# Patient Record
Sex: Female | Born: 1935 | Race: Black or African American | Hispanic: No | Smoking: Never smoker
Health system: Southern US, Community
[De-identification: ages and names within clinical notes are randomized; demographics above are authoritative.]

## PROBLEM LIST (undated history)

## (undated) DIAGNOSIS — M791 Myalgia, unspecified site: Secondary | ICD-10-CM

## (undated) DIAGNOSIS — R21 Rash and other nonspecific skin eruption: Secondary | ICD-10-CM

## (undated) DIAGNOSIS — E876 Hypokalemia: Secondary | ICD-10-CM

## (undated) DIAGNOSIS — G250 Essential tremor: Secondary | ICD-10-CM

## (undated) DIAGNOSIS — I10 Essential (primary) hypertension: Secondary | ICD-10-CM

## (undated) DIAGNOSIS — G894 Chronic pain syndrome: Secondary | ICD-10-CM

## (undated) DIAGNOSIS — K219 Gastro-esophageal reflux disease without esophagitis: Secondary | ICD-10-CM

## (undated) DIAGNOSIS — N39498 Other specified urinary incontinence: Secondary | ICD-10-CM

## (undated) DIAGNOSIS — R112 Nausea with vomiting, unspecified: Secondary | ICD-10-CM

## (undated) DIAGNOSIS — B354 Tinea corporis: Secondary | ICD-10-CM

## (undated) DIAGNOSIS — Z9889 Other specified postprocedural states: Secondary | ICD-10-CM

## (undated) DIAGNOSIS — R609 Edema, unspecified: Secondary | ICD-10-CM

## (undated) DIAGNOSIS — F419 Anxiety disorder, unspecified: Secondary | ICD-10-CM

## (undated) DIAGNOSIS — Z8719 Personal history of other diseases of the digestive system: Secondary | ICD-10-CM

## (undated) DIAGNOSIS — M1990 Unspecified osteoarthritis, unspecified site: Secondary | ICD-10-CM

## (undated) DIAGNOSIS — M419 Scoliosis, unspecified: Secondary | ICD-10-CM

## (undated) DIAGNOSIS — F32A Depression, unspecified: Secondary | ICD-10-CM

## (undated) DIAGNOSIS — R32 Unspecified urinary incontinence: Secondary | ICD-10-CM

## (undated) DIAGNOSIS — K8020 Calculus of gallbladder without cholecystitis without obstruction: Secondary | ICD-10-CM

## (undated) DIAGNOSIS — I77819 Aortic ectasia, unspecified site: Secondary | ICD-10-CM

## (undated) DIAGNOSIS — J189 Pneumonia, unspecified organism: Secondary | ICD-10-CM

## (undated) DIAGNOSIS — R29898 Other symptoms and signs involving the musculoskeletal system: Secondary | ICD-10-CM

## (undated) DIAGNOSIS — T148XXA Other injury of unspecified body region, initial encounter: Secondary | ICD-10-CM

## (undated) DIAGNOSIS — R011 Cardiac murmur, unspecified: Secondary | ICD-10-CM

## (undated) DIAGNOSIS — T7840XA Allergy, unspecified, initial encounter: Secondary | ICD-10-CM

## (undated) DIAGNOSIS — M546 Pain in thoracic spine: Secondary | ICD-10-CM

## (undated) DIAGNOSIS — Z9289 Personal history of other medical treatment: Secondary | ICD-10-CM

## (undated) DIAGNOSIS — F329 Major depressive disorder, single episode, unspecified: Secondary | ICD-10-CM

## (undated) DIAGNOSIS — S83209A Unspecified tear of unspecified meniscus, current injury, unspecified knee, initial encounter: Secondary | ICD-10-CM

## (undated) DIAGNOSIS — M79669 Pain in unspecified lower leg: Secondary | ICD-10-CM

## (undated) DIAGNOSIS — E7800 Pure hypercholesterolemia, unspecified: Secondary | ICD-10-CM

## (undated) HISTORY — DX: Chronic pain syndrome: G89.4

## (undated) HISTORY — DX: Other symptoms and signs involving the musculoskeletal system: R29.898

## (undated) HISTORY — DX: Hypokalemia: E87.6

## (undated) HISTORY — DX: Aortic ectasia, unspecified site: I77.819

## (undated) HISTORY — PX: TONSILLECTOMY: SUR1361

## (undated) HISTORY — PX: BREAST BIOPSY: SHX20

## (undated) HISTORY — DX: Essential (primary) hypertension: I10

## (undated) HISTORY — DX: Other injury of unspecified body region, initial encounter: T14.8XXA

## (undated) HISTORY — PX: KNEE ARTHROSCOPY: SUR90

## (undated) HISTORY — PX: POSTERIOR LUMBAR FUSION: SHX6036

## (undated) HISTORY — PX: SHOULDER ARTHROSCOPY W/ ROTATOR CUFF REPAIR: SHX2400

## (undated) HISTORY — DX: Other specified urinary incontinence: N39.498

## (undated) HISTORY — DX: Edema, unspecified: R60.9

## (undated) HISTORY — PX: VAGINAL HYSTERECTOMY: SUR661

## (undated) HISTORY — DX: Major depressive disorder, single episode, unspecified: F32.9

## (undated) HISTORY — DX: Allergy, unspecified, initial encounter: T78.40XA

## (undated) HISTORY — PX: JOINT REPLACEMENT: SHX530

## (undated) HISTORY — PX: DILATION AND CURETTAGE OF UTERUS: SHX78

## (undated) HISTORY — DX: Depression, unspecified: F32.A

## (undated) HISTORY — PX: BREAST LUMPECTOMY: SHX2

## (undated) HISTORY — DX: Pain in thoracic spine: M54.6

## (undated) HISTORY — PX: ABDOMINAL HYSTERECTOMY: SHX81

## (undated) HISTORY — DX: Rash and other nonspecific skin eruption: R21

## (undated) HISTORY — DX: Tinea corporis: B35.4

## (undated) HISTORY — DX: Unspecified tear of unspecified meniscus, current injury, unspecified knee, initial encounter: S83.209A

## (undated) HISTORY — DX: Essential tremor: G25.0

## (undated) HISTORY — DX: Scoliosis, unspecified: M41.9

## (undated) HISTORY — PX: BACK SURGERY: SHX140

## (undated) HISTORY — DX: Pain in unspecified lower leg: M79.669

## (undated) HISTORY — PX: ROTATOR CUFF REPAIR: SHX139

## (undated) HISTORY — DX: Myalgia, unspecified site: M79.10

## (undated) HISTORY — DX: Calculus of gallbladder without cholecystitis without obstruction: K80.20

## (undated) HISTORY — DX: Unspecified osteoarthritis, unspecified site: M19.90

## (undated) HISTORY — PX: TOTAL KNEE ARTHROPLASTY: SHX125

## (undated) HISTORY — PX: KNEE ARTHROSCOPY: SHX127

---

## 1997-12-07 ENCOUNTER — Other Ambulatory Visit: Admission: RE | Admit: 1997-12-07 | Discharge: 1997-12-07 | Payer: Self-pay | Admitting: Urology

## 1998-05-16 ENCOUNTER — Ambulatory Visit (HOSPITAL_BASED_OUTPATIENT_CLINIC_OR_DEPARTMENT_OTHER): Admission: RE | Admit: 1998-05-16 | Discharge: 1998-05-16 | Payer: Self-pay | Admitting: Orthopedic Surgery

## 1999-03-21 ENCOUNTER — Encounter: Payer: Self-pay | Admitting: Family Medicine

## 1999-03-21 ENCOUNTER — Ambulatory Visit (HOSPITAL_COMMUNITY): Admission: RE | Admit: 1999-03-21 | Discharge: 1999-03-21 | Payer: Self-pay | Admitting: Family Medicine

## 1999-04-11 ENCOUNTER — Encounter: Payer: Self-pay | Admitting: Obstetrics and Gynecology

## 1999-04-11 ENCOUNTER — Encounter: Admission: RE | Admit: 1999-04-11 | Discharge: 1999-04-11 | Payer: Self-pay | Admitting: Obstetrics and Gynecology

## 1999-04-18 ENCOUNTER — Encounter: Admission: RE | Admit: 1999-04-18 | Discharge: 1999-04-18 | Payer: Self-pay | Admitting: Obstetrics and Gynecology

## 1999-04-18 ENCOUNTER — Encounter: Payer: Self-pay | Admitting: Obstetrics and Gynecology

## 1999-05-16 ENCOUNTER — Encounter: Admission: RE | Admit: 1999-05-16 | Discharge: 1999-05-16 | Payer: Self-pay | Admitting: Obstetrics and Gynecology

## 1999-05-16 ENCOUNTER — Encounter: Payer: Self-pay | Admitting: Obstetrics and Gynecology

## 1999-05-23 ENCOUNTER — Encounter: Admission: RE | Admit: 1999-05-23 | Discharge: 1999-08-15 | Payer: Self-pay | Admitting: Family Medicine

## 1999-05-23 ENCOUNTER — Encounter (INDEPENDENT_AMBULATORY_CARE_PROVIDER_SITE_OTHER): Payer: Self-pay | Admitting: *Deleted

## 1999-05-31 ENCOUNTER — Other Ambulatory Visit: Admission: RE | Admit: 1999-05-31 | Discharge: 1999-05-31 | Payer: Self-pay | Admitting: Urology

## 1999-06-25 ENCOUNTER — Encounter: Admission: RE | Admit: 1999-06-25 | Discharge: 1999-08-15 | Payer: Self-pay | Admitting: Family Medicine

## 1999-09-18 ENCOUNTER — Encounter: Admission: RE | Admit: 1999-09-18 | Discharge: 1999-12-17 | Payer: Self-pay | Admitting: Family Medicine

## 2000-06-18 ENCOUNTER — Encounter: Payer: Self-pay | Admitting: Obstetrics and Gynecology

## 2000-06-18 ENCOUNTER — Encounter: Admission: RE | Admit: 2000-06-18 | Discharge: 2000-06-18 | Payer: Self-pay | Admitting: Obstetrics and Gynecology

## 2000-07-25 ENCOUNTER — Ambulatory Visit (HOSPITAL_COMMUNITY): Admission: RE | Admit: 2000-07-25 | Discharge: 2000-07-25 | Payer: Self-pay

## 2000-07-25 ENCOUNTER — Encounter: Payer: Self-pay | Admitting: Family Medicine

## 2001-06-17 ENCOUNTER — Encounter: Payer: Self-pay | Admitting: Family Medicine

## 2001-06-17 ENCOUNTER — Encounter: Admission: RE | Admit: 2001-06-17 | Discharge: 2001-06-17 | Payer: Self-pay | Admitting: Family Medicine

## 2002-03-12 ENCOUNTER — Encounter: Admission: RE | Admit: 2002-03-12 | Discharge: 2002-03-12 | Payer: Self-pay | Admitting: Family Medicine

## 2002-03-12 ENCOUNTER — Encounter: Payer: Self-pay | Admitting: Family Medicine

## 2002-03-21 ENCOUNTER — Encounter: Admission: RE | Admit: 2002-03-21 | Discharge: 2002-03-21 | Payer: Self-pay | Admitting: Family Medicine

## 2002-03-21 ENCOUNTER — Encounter: Payer: Self-pay | Admitting: Family Medicine

## 2002-07-14 ENCOUNTER — Encounter: Payer: Self-pay | Admitting: Family Medicine

## 2002-07-14 ENCOUNTER — Encounter: Admission: RE | Admit: 2002-07-14 | Discharge: 2002-07-14 | Payer: Self-pay | Admitting: Family Medicine

## 2002-11-25 ENCOUNTER — Emergency Department (HOSPITAL_COMMUNITY): Admission: EM | Admit: 2002-11-25 | Discharge: 2002-11-25 | Payer: Self-pay | Admitting: Emergency Medicine

## 2002-12-03 ENCOUNTER — Encounter: Admission: RE | Admit: 2002-12-03 | Discharge: 2002-12-03 | Payer: Self-pay | Admitting: Family Medicine

## 2002-12-03 ENCOUNTER — Encounter: Payer: Self-pay | Admitting: Family Medicine

## 2003-04-12 ENCOUNTER — Encounter: Admission: RE | Admit: 2003-04-12 | Discharge: 2003-04-12 | Payer: Self-pay | Admitting: Neurology

## 2003-07-25 ENCOUNTER — Encounter: Admission: RE | Admit: 2003-07-25 | Discharge: 2003-07-25 | Payer: Self-pay | Admitting: Orthopedic Surgery

## 2003-10-10 ENCOUNTER — Encounter
Admission: RE | Admit: 2003-10-10 | Discharge: 2003-10-10 | Payer: Self-pay | Admitting: Physical Medicine and Rehabilitation

## 2004-01-13 ENCOUNTER — Ambulatory Visit (HOSPITAL_COMMUNITY): Admission: RE | Admit: 2004-01-13 | Discharge: 2004-01-13 | Payer: Self-pay | Admitting: Family Medicine

## 2004-04-03 ENCOUNTER — Ambulatory Visit: Payer: Self-pay | Admitting: Internal Medicine

## 2004-04-03 ENCOUNTER — Ambulatory Visit: Payer: Self-pay | Admitting: Family Medicine

## 2004-04-06 ENCOUNTER — Ambulatory Visit: Payer: Self-pay | Admitting: Cardiology

## 2004-04-09 ENCOUNTER — Ambulatory Visit: Payer: Self-pay

## 2004-04-19 ENCOUNTER — Ambulatory Visit: Payer: Self-pay | Admitting: Internal Medicine

## 2004-04-20 ENCOUNTER — Ambulatory Visit: Payer: Self-pay | Admitting: *Deleted

## 2004-04-23 ENCOUNTER — Ambulatory Visit: Payer: Self-pay | Admitting: *Deleted

## 2004-05-07 ENCOUNTER — Ambulatory Visit: Payer: Self-pay | Admitting: *Deleted

## 2004-05-21 ENCOUNTER — Ambulatory Visit: Payer: Self-pay | Admitting: Internal Medicine

## 2004-05-22 ENCOUNTER — Ambulatory Visit: Payer: Self-pay | Admitting: Family Medicine

## 2004-06-01 ENCOUNTER — Ambulatory Visit: Payer: Self-pay | Admitting: Family Medicine

## 2004-06-12 ENCOUNTER — Ambulatory Visit: Payer: Self-pay | Admitting: Family Medicine

## 2004-06-13 ENCOUNTER — Encounter: Admission: RE | Admit: 2004-06-13 | Discharge: 2004-06-13 | Payer: Self-pay | Admitting: Family Medicine

## 2004-06-19 ENCOUNTER — Ambulatory Visit: Payer: Self-pay | Admitting: Internal Medicine

## 2004-06-21 ENCOUNTER — Ambulatory Visit: Payer: Self-pay | Admitting: Internal Medicine

## 2004-06-26 ENCOUNTER — Ambulatory Visit: Payer: Self-pay | Admitting: Family Medicine

## 2004-07-02 ENCOUNTER — Encounter: Admission: RE | Admit: 2004-07-02 | Discharge: 2004-07-02 | Payer: Self-pay | Admitting: Family Medicine

## 2004-07-04 ENCOUNTER — Ambulatory Visit: Payer: Self-pay | Admitting: Family Medicine

## 2004-07-05 ENCOUNTER — Ambulatory Visit: Payer: Self-pay

## 2004-07-16 ENCOUNTER — Encounter: Admission: RE | Admit: 2004-07-16 | Discharge: 2004-07-16 | Payer: Self-pay | Admitting: Family Medicine

## 2004-07-26 ENCOUNTER — Ambulatory Visit: Payer: Self-pay | Admitting: Family Medicine

## 2004-07-30 ENCOUNTER — Ambulatory Visit: Payer: Self-pay | Admitting: Family Medicine

## 2004-08-10 ENCOUNTER — Ambulatory Visit: Payer: Self-pay | Admitting: Internal Medicine

## 2004-08-15 ENCOUNTER — Ambulatory Visit: Payer: Self-pay | Admitting: Family Medicine

## 2004-09-19 ENCOUNTER — Ambulatory Visit: Payer: Self-pay | Admitting: Internal Medicine

## 2004-11-12 ENCOUNTER — Ambulatory Visit: Payer: Self-pay | Admitting: Internal Medicine

## 2004-12-04 ENCOUNTER — Ambulatory Visit: Payer: Self-pay | Admitting: Family Medicine

## 2004-12-21 ENCOUNTER — Ambulatory Visit: Payer: Self-pay | Admitting: Internal Medicine

## 2004-12-21 ENCOUNTER — Ambulatory Visit: Payer: Self-pay | Admitting: Family Medicine

## 2005-01-08 ENCOUNTER — Ambulatory Visit: Payer: Self-pay | Admitting: Internal Medicine

## 2005-01-11 ENCOUNTER — Ambulatory Visit: Payer: Self-pay | Admitting: Family Medicine

## 2005-02-06 ENCOUNTER — Encounter (INDEPENDENT_AMBULATORY_CARE_PROVIDER_SITE_OTHER): Payer: Self-pay | Admitting: *Deleted

## 2005-02-06 ENCOUNTER — Ambulatory Visit: Payer: Self-pay | Admitting: Physical Medicine & Rehabilitation

## 2005-02-06 ENCOUNTER — Inpatient Hospital Stay (HOSPITAL_COMMUNITY): Admission: RE | Admit: 2005-02-06 | Discharge: 2005-02-12 | Payer: Self-pay | Admitting: Specialist

## 2005-02-12 ENCOUNTER — Inpatient Hospital Stay
Admission: RE | Admit: 2005-02-12 | Discharge: 2005-02-22 | Payer: Self-pay | Admitting: Physical Medicine & Rehabilitation

## 2005-03-18 HISTORY — PX: LUMBAR LAMINECTOMY/DECOMPRESSION MICRODISCECTOMY: SHX5026

## 2005-03-19 ENCOUNTER — Ambulatory Visit: Payer: Self-pay | Admitting: Family Medicine

## 2005-04-04 ENCOUNTER — Ambulatory Visit: Payer: Self-pay | Admitting: Family Medicine

## 2005-06-18 ENCOUNTER — Ambulatory Visit: Payer: Self-pay | Admitting: Internal Medicine

## 2005-11-07 ENCOUNTER — Ambulatory Visit: Payer: Self-pay | Admitting: Family Medicine

## 2005-12-19 ENCOUNTER — Encounter: Admission: RE | Admit: 2005-12-19 | Discharge: 2005-12-19 | Payer: Self-pay | Admitting: Specialist

## 2006-01-10 ENCOUNTER — Ambulatory Visit: Payer: Self-pay | Admitting: Family Medicine

## 2006-01-21 ENCOUNTER — Encounter: Admission: RE | Admit: 2006-01-21 | Discharge: 2006-01-21 | Payer: Self-pay | Admitting: Specialist

## 2006-02-25 ENCOUNTER — Encounter: Admission: RE | Admit: 2006-02-25 | Discharge: 2006-02-25 | Payer: Self-pay | Admitting: Specialist

## 2006-03-04 ENCOUNTER — Ambulatory Visit: Payer: Self-pay | Admitting: Family Medicine

## 2006-05-01 ENCOUNTER — Ambulatory Visit: Payer: Self-pay | Admitting: Internal Medicine

## 2006-05-01 ENCOUNTER — Ambulatory Visit: Payer: Self-pay | Admitting: Family Medicine

## 2006-05-15 ENCOUNTER — Ambulatory Visit: Payer: Self-pay | Admitting: Family Medicine

## 2006-06-17 ENCOUNTER — Emergency Department (HOSPITAL_COMMUNITY): Admission: EM | Admit: 2006-06-17 | Discharge: 2006-06-17 | Payer: Self-pay | Admitting: Family Medicine

## 2006-06-19 ENCOUNTER — Ambulatory Visit: Payer: Self-pay | Admitting: Family Medicine

## 2006-06-25 ENCOUNTER — Ambulatory Visit: Payer: Self-pay | Admitting: Internal Medicine

## 2006-06-25 LAB — CONVERTED CEMR LAB
ALT: 15 units/L (ref 0–40)
AST: 14 units/L (ref 0–37)
Alkaline Phosphatase: 60 units/L (ref 39–117)
BUN: 23 mg/dL (ref 6–23)
Bilirubin, Direct: 0.1 mg/dL (ref 0.0–0.3)
CO2: 27 meq/L (ref 19–32)
Calcium: 9.7 mg/dL (ref 8.4–10.5)
Creatinine, Ser: 1.2 mg/dL (ref 0.4–1.2)
Direct LDL: 140.9 mg/dL
GFR calc Af Amer: 57 mL/min
Glucose, Bld: 110 mg/dL — ABNORMAL HIGH (ref 70–99)
Triglycerides: 127 mg/dL (ref 0–149)
VLDL: 25 mg/dL (ref 0–40)

## 2006-08-07 ENCOUNTER — Emergency Department (HOSPITAL_COMMUNITY): Admission: EM | Admit: 2006-08-07 | Discharge: 2006-08-07 | Payer: Self-pay | Admitting: Family Medicine

## 2006-08-12 ENCOUNTER — Ambulatory Visit: Payer: Self-pay | Admitting: Family Medicine

## 2006-08-20 DIAGNOSIS — I10 Essential (primary) hypertension: Secondary | ICD-10-CM | POA: Insufficient documentation

## 2006-08-20 DIAGNOSIS — M1990 Unspecified osteoarthritis, unspecified site: Secondary | ICD-10-CM

## 2006-08-25 ENCOUNTER — Ambulatory Visit: Payer: Self-pay | Admitting: Internal Medicine

## 2006-08-26 ENCOUNTER — Encounter (INDEPENDENT_AMBULATORY_CARE_PROVIDER_SITE_OTHER): Payer: Self-pay | Admitting: *Deleted

## 2006-09-22 ENCOUNTER — Ambulatory Visit: Payer: Self-pay | Admitting: Family Medicine

## 2006-09-22 DIAGNOSIS — Z885 Allergy status to narcotic agent status: Secondary | ICD-10-CM | POA: Insufficient documentation

## 2006-09-22 DIAGNOSIS — B354 Tinea corporis: Secondary | ICD-10-CM | POA: Insufficient documentation

## 2006-10-09 ENCOUNTER — Telehealth (INDEPENDENT_AMBULATORY_CARE_PROVIDER_SITE_OTHER): Payer: Self-pay | Admitting: *Deleted

## 2006-12-03 ENCOUNTER — Telehealth (INDEPENDENT_AMBULATORY_CARE_PROVIDER_SITE_OTHER): Payer: Self-pay | Admitting: *Deleted

## 2006-12-05 ENCOUNTER — Emergency Department (HOSPITAL_COMMUNITY): Admission: EM | Admit: 2006-12-05 | Discharge: 2006-12-06 | Payer: Self-pay | Admitting: Emergency Medicine

## 2006-12-17 ENCOUNTER — Ambulatory Visit: Payer: Self-pay | Admitting: Internal Medicine

## 2006-12-22 ENCOUNTER — Emergency Department (HOSPITAL_COMMUNITY): Admission: EM | Admit: 2006-12-22 | Discharge: 2006-12-22 | Payer: Self-pay | Admitting: Emergency Medicine

## 2006-12-25 ENCOUNTER — Inpatient Hospital Stay (HOSPITAL_COMMUNITY): Admission: AD | Admit: 2006-12-25 | Discharge: 2006-12-28 | Payer: Self-pay | Admitting: *Deleted

## 2006-12-25 ENCOUNTER — Ambulatory Visit: Payer: Self-pay | Admitting: *Deleted

## 2006-12-25 ENCOUNTER — Telehealth: Payer: Self-pay | Admitting: Family Medicine

## 2007-01-01 ENCOUNTER — Ambulatory Visit: Payer: Self-pay | Admitting: Family Medicine

## 2007-01-01 ENCOUNTER — Telehealth: Payer: Self-pay | Admitting: Family Medicine

## 2007-01-01 DIAGNOSIS — Z862 Personal history of diseases of the blood and blood-forming organs and certain disorders involving the immune mechanism: Secondary | ICD-10-CM | POA: Insufficient documentation

## 2007-01-01 DIAGNOSIS — F329 Major depressive disorder, single episode, unspecified: Secondary | ICD-10-CM

## 2007-01-01 DIAGNOSIS — Z8639 Personal history of other endocrine, nutritional and metabolic disease: Secondary | ICD-10-CM

## 2007-01-05 LAB — CONVERTED CEMR LAB
ALT: 19 units/L (ref 0–35)
AST: 19 units/L (ref 0–37)
Alkaline Phosphatase: 52 units/L (ref 39–117)
BUN: 13 mg/dL (ref 6–23)
Cholesterol: 229 mg/dL (ref 0–200)
Direct LDL: 162.7 mg/dL
HDL: 49.3 mg/dL (ref >39.0)
Potassium: 3.5 meq/L (ref 3.5–5.1)
Sodium: 140 meq/L (ref 135–145)
Total CHOL/HDL Ratio: 4.6
Total Protein: 6.7 g/dL (ref 6.0–8.3)

## 2007-01-19 ENCOUNTER — Telehealth: Payer: Self-pay | Admitting: Family Medicine

## 2007-01-23 ENCOUNTER — Encounter (INDEPENDENT_AMBULATORY_CARE_PROVIDER_SITE_OTHER): Payer: Self-pay | Admitting: *Deleted

## 2007-02-05 ENCOUNTER — Telehealth: Payer: Self-pay | Admitting: Family Medicine

## 2007-02-06 ENCOUNTER — Telehealth (INDEPENDENT_AMBULATORY_CARE_PROVIDER_SITE_OTHER): Payer: Self-pay | Admitting: *Deleted

## 2007-02-10 ENCOUNTER — Ambulatory Visit: Payer: Self-pay | Admitting: Family Medicine

## 2007-02-10 DIAGNOSIS — M79609 Pain in unspecified limb: Secondary | ICD-10-CM

## 2007-02-10 DIAGNOSIS — IMO0001 Reserved for inherently not codable concepts without codable children: Secondary | ICD-10-CM

## 2007-02-11 ENCOUNTER — Encounter: Payer: Self-pay | Admitting: Family Medicine

## 2007-02-11 ENCOUNTER — Ambulatory Visit: Payer: Self-pay | Admitting: Cardiology

## 2007-02-17 DIAGNOSIS — R29898 Other symptoms and signs involving the musculoskeletal system: Secondary | ICD-10-CM

## 2007-02-23 ENCOUNTER — Encounter: Admission: RE | Admit: 2007-02-23 | Discharge: 2007-02-23 | Payer: Self-pay | Admitting: Family Medicine

## 2007-02-25 DIAGNOSIS — IMO0002 Reserved for concepts with insufficient information to code with codable children: Secondary | ICD-10-CM

## 2007-04-01 ENCOUNTER — Ambulatory Visit: Payer: Self-pay | Admitting: Family Medicine

## 2007-04-08 LAB — CONVERTED CEMR LAB
Cholesterol: 207 mg/dL (ref 0–200)
HDL: 47.7 mg/dL (ref >39.0)
Total Bilirubin: 0.8 mg/dL (ref 0.3–1.2)
Total Protein: 7 g/dL (ref 6.0–8.3)
Triglycerides: 103 mg/dL (ref 0–149)
VLDL: 21 mg/dL (ref 0–40)

## 2007-04-13 ENCOUNTER — Telehealth (INDEPENDENT_AMBULATORY_CARE_PROVIDER_SITE_OTHER): Payer: Self-pay | Admitting: *Deleted

## 2007-04-17 ENCOUNTER — Ambulatory Visit: Payer: Self-pay | Admitting: Internal Medicine

## 2007-05-23 ENCOUNTER — Emergency Department (HOSPITAL_COMMUNITY): Admission: EM | Admit: 2007-05-23 | Discharge: 2007-05-23 | Payer: Self-pay | Admitting: Family Medicine

## 2007-06-09 ENCOUNTER — Ambulatory Visit: Payer: Self-pay | Admitting: Family Medicine

## 2007-06-11 ENCOUNTER — Ambulatory Visit: Payer: Self-pay | Admitting: Family Medicine

## 2007-06-11 DIAGNOSIS — M546 Pain in thoracic spine: Secondary | ICD-10-CM

## 2007-06-11 LAB — CONVERTED CEMR LAB
Blood in Urine, dipstick: NEGATIVE
Glucose, Urine, Semiquant: NEGATIVE
Nitrite: NEGATIVE
Protein, U semiquant: NEGATIVE

## 2007-06-12 ENCOUNTER — Encounter (INDEPENDENT_AMBULATORY_CARE_PROVIDER_SITE_OTHER): Payer: Self-pay | Admitting: *Deleted

## 2007-06-12 ENCOUNTER — Ambulatory Visit: Payer: Self-pay | Admitting: Family Medicine

## 2007-06-15 ENCOUNTER — Telehealth: Payer: Self-pay | Admitting: Family Medicine

## 2007-06-15 DIAGNOSIS — M4120 Other idiopathic scoliosis, site unspecified: Secondary | ICD-10-CM | POA: Insufficient documentation

## 2007-07-06 ENCOUNTER — Encounter (INDEPENDENT_AMBULATORY_CARE_PROVIDER_SITE_OTHER): Payer: Self-pay | Admitting: *Deleted

## 2007-07-28 ENCOUNTER — Encounter: Payer: Self-pay | Admitting: Family Medicine

## 2007-09-08 ENCOUNTER — Telehealth (INDEPENDENT_AMBULATORY_CARE_PROVIDER_SITE_OTHER): Payer: Self-pay | Admitting: *Deleted

## 2007-09-14 ENCOUNTER — Ambulatory Visit: Payer: Self-pay | Admitting: Family Medicine

## 2007-09-14 DIAGNOSIS — T148XXA Other injury of unspecified body region, initial encounter: Secondary | ICD-10-CM | POA: Insufficient documentation

## 2007-09-22 ENCOUNTER — Telehealth (INDEPENDENT_AMBULATORY_CARE_PROVIDER_SITE_OTHER): Payer: Self-pay | Admitting: *Deleted

## 2007-09-24 ENCOUNTER — Encounter (INDEPENDENT_AMBULATORY_CARE_PROVIDER_SITE_OTHER): Payer: Self-pay | Admitting: *Deleted

## 2007-09-24 LAB — CONVERTED CEMR LAB
ALT: 13 units/L (ref 0–35)
AST: 15 units/L (ref 0–37)
Alkaline Phosphatase: 67 units/L (ref 39–117)
Basophils Relative: 0.6 % (ref 0.0–1.0)
Bilirubin, Direct: 0.1 mg/dL (ref 0.0–0.3)
Calcium: 9.8 mg/dL (ref 8.4–10.5)
GFR calc Af Amer: 52 mL/min
HCT: 32.5 % — ABNORMAL LOW (ref 36.0–46.0)
Hemoglobin: 11 g/dL — ABNORMAL LOW (ref 12.0–15.0)
INR: 1.1 — ABNORMAL HIGH (ref 0.8–1.0)
MCHC: 33.9 g/dL (ref 30.0–36.0)
Monocytes Absolute: 0.6 10*3/uL (ref 0.1–1.0)
Neutrophils Relative %: 47.4 % (ref 43.0–77.0)
Prothrombin Time: 13.3 s (ref 10.9–13.3)
RDW: 12.8 % (ref 11.5–14.6)
Sodium: 141 meq/L (ref 135–145)
Total Bilirubin: 0.8 mg/dL (ref 0.3–1.2)
WBC: 5.5 10*3/uL (ref 4.5–10.5)

## 2007-09-29 ENCOUNTER — Telehealth (INDEPENDENT_AMBULATORY_CARE_PROVIDER_SITE_OTHER): Payer: Self-pay | Admitting: *Deleted

## 2007-10-22 ENCOUNTER — Ambulatory Visit: Payer: Self-pay | Admitting: Family Medicine

## 2007-11-01 LAB — CONVERTED CEMR LAB
Albumin: 4 g/dL (ref 3.5–5.2)
Alkaline Phosphatase: 69 units/L (ref 39–117)
Basophils Absolute: 0.1 10*3/uL (ref 0.0–0.1)
Eosinophils Absolute: 0.4 10*3/uL (ref 0.0–0.7)
Eosinophils Relative: 5.6 % — ABNORMAL HIGH (ref 0.0–5.0)
HCT: 32.6 % — ABNORMAL LOW (ref 36.0–46.0)
Lymphocytes Relative: 43.2 % (ref 12.0–46.0)
Monocytes Absolute: 0.7 10*3/uL (ref 0.1–1.0)
Neutro Abs: 2.5 10*3/uL (ref 1.4–7.7)
Neutrophils Relative %: 39.7 % — ABNORMAL LOW (ref 43.0–77.0)
RBC: 3.64 M/uL — ABNORMAL LOW (ref 3.87–5.11)
RDW: 12.7 % (ref 11.5–14.6)
Total Bilirubin: 0.7 mg/dL (ref 0.3–1.2)
Total Protein: 6.8 g/dL (ref 6.0–8.3)
WBC: 6.3 10*3/uL (ref 4.5–10.5)

## 2007-11-02 ENCOUNTER — Encounter (INDEPENDENT_AMBULATORY_CARE_PROVIDER_SITE_OTHER): Payer: Self-pay | Admitting: *Deleted

## 2007-11-12 ENCOUNTER — Telehealth (INDEPENDENT_AMBULATORY_CARE_PROVIDER_SITE_OTHER): Payer: Self-pay | Admitting: *Deleted

## 2007-11-29 ENCOUNTER — Encounter: Payer: Self-pay | Admitting: Internal Medicine

## 2007-11-29 ENCOUNTER — Inpatient Hospital Stay (HOSPITAL_COMMUNITY): Admission: EM | Admit: 2007-11-29 | Discharge: 2007-12-01 | Payer: Self-pay | Admitting: Emergency Medicine

## 2007-11-29 ENCOUNTER — Ambulatory Visit: Payer: Self-pay | Admitting: Internal Medicine

## 2007-12-01 ENCOUNTER — Encounter: Payer: Self-pay | Admitting: Family Medicine

## 2007-12-07 ENCOUNTER — Ambulatory Visit: Payer: Self-pay | Admitting: Family Medicine

## 2007-12-07 LAB — CONVERTED CEMR LAB
Iron: 68 ug/dL (ref 42–145)
Transferrin: 202.2 mg/dL — ABNORMAL LOW (ref >=212.0)

## 2007-12-08 ENCOUNTER — Encounter: Payer: Self-pay | Admitting: Family Medicine

## 2007-12-09 ENCOUNTER — Ambulatory Visit: Payer: Self-pay | Admitting: Family Medicine

## 2007-12-10 ENCOUNTER — Emergency Department (HOSPITAL_COMMUNITY): Admission: EM | Admit: 2007-12-10 | Discharge: 2007-12-10 | Payer: Self-pay | Admitting: Emergency Medicine

## 2007-12-10 ENCOUNTER — Telehealth: Payer: Self-pay | Admitting: Family Medicine

## 2007-12-14 ENCOUNTER — Telehealth (INDEPENDENT_AMBULATORY_CARE_PROVIDER_SITE_OTHER): Payer: Self-pay | Admitting: *Deleted

## 2007-12-14 LAB — CONVERTED CEMR LAB
Eosinophils Absolute: 0.3 10*3/uL (ref 0.0–0.7)
HCT: 33.2 % — ABNORMAL LOW (ref 36.0–46.0)
Hemoglobin: 10.9 g/dL — ABNORMAL LOW (ref 12.0–15.0)
Lymphocytes Relative: 48.7 % — ABNORMAL HIGH (ref 12.0–46.0)
MCV: 91.1 fL (ref 78.0–100.0)
Neutro Abs: 1.3 10*3/uL — ABNORMAL LOW (ref 1.4–7.7)
Neutrophils Relative %: 31.4 % — ABNORMAL LOW (ref 43.0–77.0)
WBC: 4 10*3/uL — ABNORMAL LOW (ref 4.5–10.5)

## 2007-12-15 ENCOUNTER — Encounter: Admission: RE | Admit: 2007-12-15 | Discharge: 2007-12-15 | Payer: Self-pay | Admitting: Neurosurgery

## 2007-12-22 ENCOUNTER — Ambulatory Visit: Payer: Self-pay | Admitting: Family Medicine

## 2007-12-22 ENCOUNTER — Encounter (INDEPENDENT_AMBULATORY_CARE_PROVIDER_SITE_OTHER): Payer: Self-pay | Admitting: *Deleted

## 2007-12-22 LAB — CONVERTED CEMR LAB
OCCULT 2: NEGATIVE
OCCULT 3: NEGATIVE

## 2007-12-28 ENCOUNTER — Ambulatory Visit: Payer: Self-pay | Admitting: Family Medicine

## 2007-12-30 ENCOUNTER — Ambulatory Visit: Payer: Self-pay | Admitting: Psychology

## 2007-12-30 ENCOUNTER — Ambulatory Visit: Payer: Self-pay | Admitting: Internal Medicine

## 2007-12-31 ENCOUNTER — Ambulatory Visit: Payer: Self-pay | Admitting: Family Medicine

## 2007-12-31 DIAGNOSIS — G894 Chronic pain syndrome: Secondary | ICD-10-CM

## 2007-12-31 LAB — CONVERTED CEMR LAB
Basophils Absolute: 0 10*3/uL (ref 0.0–0.1)
Basophils Relative: 0.7 % (ref 0.0–3.0)
Eosinophils Absolute: 0.3 10*3/uL (ref 0.0–0.7)
Platelets: 328 10*3/uL (ref 150–400)

## 2008-01-01 ENCOUNTER — Encounter (INDEPENDENT_AMBULATORY_CARE_PROVIDER_SITE_OTHER): Payer: Self-pay | Admitting: *Deleted

## 2008-01-11 ENCOUNTER — Ambulatory Visit: Payer: Self-pay | Admitting: Psychology

## 2008-01-14 ENCOUNTER — Ambulatory Visit: Payer: Self-pay | Admitting: Family Medicine

## 2008-01-15 ENCOUNTER — Encounter: Payer: Self-pay | Admitting: Family Medicine

## 2008-01-21 ENCOUNTER — Encounter: Payer: Self-pay | Admitting: Family Medicine

## 2008-01-28 ENCOUNTER — Ambulatory Visit: Payer: Self-pay | Admitting: Psychology

## 2008-02-15 ENCOUNTER — Ambulatory Visit: Payer: Self-pay | Admitting: Family Medicine

## 2008-02-15 DIAGNOSIS — N39498 Other specified urinary incontinence: Secondary | ICD-10-CM | POA: Insufficient documentation

## 2008-02-15 LAB — CONVERTED CEMR LAB
Bilirubin Urine: NEGATIVE
Blood in Urine, dipstick: NEGATIVE
Glucose, Urine, Semiquant: NEGATIVE
Ketones, urine, test strip: NEGATIVE
Nitrite: NEGATIVE
Protein, U semiquant: NEGATIVE
Specific Gravity, Urine: 1.025
WBC Urine, dipstick: NEGATIVE

## 2008-02-25 ENCOUNTER — Ambulatory Visit: Payer: Self-pay | Admitting: Psychology

## 2008-03-21 ENCOUNTER — Ambulatory Visit: Payer: Self-pay | Admitting: Family Medicine

## 2008-03-24 ENCOUNTER — Ambulatory Visit: Payer: Self-pay | Admitting: Psychology

## 2008-04-06 ENCOUNTER — Telehealth (INDEPENDENT_AMBULATORY_CARE_PROVIDER_SITE_OTHER): Payer: Self-pay | Admitting: *Deleted

## 2008-04-06 ENCOUNTER — Encounter (INDEPENDENT_AMBULATORY_CARE_PROVIDER_SITE_OTHER): Payer: Self-pay | Admitting: *Deleted

## 2008-04-06 LAB — CONVERTED CEMR LAB
ALT: 13 units/L (ref 0–35)
AST: 16 units/L (ref 0–37)
Alkaline Phosphatase: 62 units/L (ref 39–117)
Bilirubin, Direct: 0.1 mg/dL (ref 0.0–0.3)
CO2: 27 meq/L (ref 19–32)
Cholesterol: 191 mg/dL (ref 0–200)
Ferritin: 80.4 ng/mL (ref 10.0–291.0)
GFR calc Af Amer: 57 mL/min
GFR calc non Af Amer: 47 mL/min
Glucose, Bld: 97 mg/dL (ref 70–99)
HCT: 35.2 % — ABNORMAL LOW (ref 36.0–46.0)
Hemoglobin: 11.9 g/dL — ABNORMAL LOW (ref 12.0–15.0)
Lymphocytes Relative: 38.4 % (ref 12.0–46.0)
MCHC: 33.8 g/dL (ref 30.0–36.0)
MCV: 89.7 fL (ref 78.0–100.0)
Neutrophils Relative %: 44.6 % (ref 43.0–77.0)
RBC: 3.92 M/uL (ref 3.87–5.11)
RDW: 13.2 % (ref 11.5–14.6)

## 2008-04-26 ENCOUNTER — Ambulatory Visit: Payer: Self-pay | Admitting: Psychology

## 2008-05-05 ENCOUNTER — Encounter: Payer: Self-pay | Admitting: Family Medicine

## 2008-05-05 ENCOUNTER — Telehealth: Payer: Self-pay | Admitting: Family Medicine

## 2008-07-07 ENCOUNTER — Telehealth: Payer: Self-pay | Admitting: Family Medicine

## 2008-07-08 ENCOUNTER — Telehealth (INDEPENDENT_AMBULATORY_CARE_PROVIDER_SITE_OTHER): Payer: Self-pay | Admitting: *Deleted

## 2008-08-25 ENCOUNTER — Ambulatory Visit: Payer: Self-pay | Admitting: Family Medicine

## 2008-08-25 DIAGNOSIS — R609 Edema, unspecified: Secondary | ICD-10-CM | POA: Insufficient documentation

## 2008-09-08 ENCOUNTER — Ambulatory Visit: Payer: Self-pay | Admitting: Family Medicine

## 2008-09-08 ENCOUNTER — Telehealth (INDEPENDENT_AMBULATORY_CARE_PROVIDER_SITE_OTHER): Payer: Self-pay | Admitting: *Deleted

## 2008-10-11 ENCOUNTER — Ambulatory Visit: Payer: Self-pay | Admitting: Family Medicine

## 2008-10-11 DIAGNOSIS — R21 Rash and other nonspecific skin eruption: Secondary | ICD-10-CM

## 2008-10-19 ENCOUNTER — Encounter: Payer: Self-pay | Admitting: Family Medicine

## 2008-10-19 LAB — CONVERTED CEMR LAB
BUN: 21 mg/dL (ref 6–23)
Chloride: 107 meq/L (ref 96–112)
Creatinine, Ser: 0.9 mg/dL (ref 0.4–1.2)
GFR calc non Af Amer: 79.01 mL/min (ref >60)
Glucose, Bld: 100 mg/dL — ABNORMAL HIGH (ref 70–99)
Sodium: 143 meq/L (ref 135–145)

## 2008-11-22 ENCOUNTER — Telehealth (INDEPENDENT_AMBULATORY_CARE_PROVIDER_SITE_OTHER): Payer: Self-pay | Admitting: *Deleted

## 2008-11-24 ENCOUNTER — Ambulatory Visit: Payer: Self-pay | Admitting: Family Medicine

## 2008-11-29 LAB — CONVERTED CEMR LAB
CO2: 30 meq/L (ref 19–32)
Potassium: 3.8 meq/L (ref 3.5–5.1)

## 2008-12-01 ENCOUNTER — Ambulatory Visit: Payer: Self-pay | Admitting: Family Medicine

## 2008-12-09 ENCOUNTER — Encounter: Payer: Self-pay | Admitting: Family Medicine

## 2008-12-09 LAB — CONVERTED CEMR LAB
BUN: 27 mg/dL — ABNORMAL HIGH (ref 6–23)
Chloride: 108 meq/L (ref 96–112)
Creatinine, Ser: 1.1 mg/dL (ref 0.4–1.2)
GFR calc non Af Amer: 62.66 mL/min (ref >60)
Glucose, Bld: 87 mg/dL (ref 70–99)

## 2008-12-22 ENCOUNTER — Encounter (INDEPENDENT_AMBULATORY_CARE_PROVIDER_SITE_OTHER): Payer: Self-pay | Admitting: *Deleted

## 2009-01-10 ENCOUNTER — Encounter: Payer: Self-pay | Admitting: Family Medicine

## 2009-01-13 ENCOUNTER — Encounter: Payer: Self-pay | Admitting: Family Medicine

## 2009-01-13 DIAGNOSIS — R079 Chest pain, unspecified: Secondary | ICD-10-CM | POA: Insufficient documentation

## 2009-01-16 ENCOUNTER — Ambulatory Visit: Payer: Self-pay | Admitting: Internal Medicine

## 2009-01-23 ENCOUNTER — Encounter: Admission: RE | Admit: 2009-01-23 | Discharge: 2009-01-23 | Payer: Self-pay | Admitting: Neurosurgery

## 2009-01-30 ENCOUNTER — Encounter: Payer: Self-pay | Admitting: Family Medicine

## 2009-01-30 ENCOUNTER — Encounter: Payer: Self-pay | Admitting: Internal Medicine

## 2009-03-01 ENCOUNTER — Telehealth: Payer: Self-pay | Admitting: Family Medicine

## 2009-03-30 ENCOUNTER — Encounter: Payer: Self-pay | Admitting: Family Medicine

## 2009-04-07 ENCOUNTER — Encounter: Payer: Self-pay | Admitting: Family Medicine

## 2009-06-22 ENCOUNTER — Ambulatory Visit: Payer: Self-pay | Admitting: Family Medicine

## 2009-06-29 ENCOUNTER — Encounter: Payer: Self-pay | Admitting: Physician Assistant

## 2009-06-29 ENCOUNTER — Ambulatory Visit: Payer: Self-pay | Admitting: Internal Medicine

## 2009-06-29 DIAGNOSIS — R0989 Other specified symptoms and signs involving the circulatory and respiratory systems: Secondary | ICD-10-CM

## 2009-07-04 ENCOUNTER — Telehealth (INDEPENDENT_AMBULATORY_CARE_PROVIDER_SITE_OTHER): Payer: Self-pay | Admitting: *Deleted

## 2009-07-05 ENCOUNTER — Encounter (HOSPITAL_COMMUNITY): Admission: RE | Admit: 2009-07-05 | Discharge: 2009-09-12 | Payer: Self-pay | Admitting: Internal Medicine

## 2009-07-05 ENCOUNTER — Ambulatory Visit: Payer: Self-pay

## 2009-07-05 ENCOUNTER — Ambulatory Visit: Payer: Self-pay | Admitting: Internal Medicine

## 2009-07-06 ENCOUNTER — Ambulatory Visit: Payer: Self-pay

## 2009-07-06 ENCOUNTER — Encounter: Payer: Self-pay | Admitting: Internal Medicine

## 2009-07-11 ENCOUNTER — Telehealth: Payer: Self-pay | Admitting: Internal Medicine

## 2009-07-12 ENCOUNTER — Encounter: Payer: Self-pay | Admitting: Internal Medicine

## 2009-07-17 ENCOUNTER — Inpatient Hospital Stay (HOSPITAL_COMMUNITY): Admission: RE | Admit: 2009-07-17 | Discharge: 2009-07-20 | Payer: Self-pay | Admitting: Orthopedic Surgery

## 2009-08-03 ENCOUNTER — Telehealth: Payer: Self-pay | Admitting: Family Medicine

## 2009-08-15 ENCOUNTER — Encounter: Payer: Self-pay | Admitting: Family Medicine

## 2009-10-25 ENCOUNTER — Encounter: Admission: RE | Admit: 2009-10-25 | Discharge: 2009-10-25 | Payer: Self-pay | Admitting: Sports Medicine

## 2010-04-15 LAB — CONVERTED CEMR LAB
AST: 11 units/L (ref 0–37)
Albumin: 3.8 g/dL (ref 3.5–5.2)
Alkaline Phosphatase: 63 units/L (ref 39–117)
Bilirubin, Direct: 0.1 mg/dL (ref 0.0–0.3)
Total Bilirubin: 0.8 mg/dL (ref 0.3–1.2)

## 2010-04-17 NOTE — Progress Notes (Signed)
Summary: Nuclear Pre-Procedure  Phone Note Outgoing Call   Call placed by: Milana Na, EMT-P,  July 04, 2009 10:52 AM Summary of Call: Reviewed information on Myoview Information Sheet (see scanned document for further details).  Spoke with patient.     Nuclear Med Background Indications for Stress Test: Evaluation for Ischemia, Surgical Clearance  Indications Comments: Pending (R) TKR   Dr. Shela Commons  07/17/09  History: Myocardial Perfusion Study  History Comments: '07 MPS (-) ischemia     Nuclear Pre-Procedure Cardiac Risk Factors: Carotid Disease, Hypertension, Lipids, Obesity Height (in): 67  Nuclear Med Study Referring MD:  D.Bensimhon

## 2010-04-17 NOTE — Letter (Signed)
Summary: Clearance Letter  [facility], Main Office  [address]   [place], [place] [address]   Phone: [phone]  Fax: [phone]    July 12, 2009  Re:     [name] Address:   [address] [place], [place]  [address] DOB:     02-Jan-1934 MRN:     [id number]   Dear Homero Fellers,  [name] is a 77 year old woman whom I have been following in cardiology clinic for hypertension and atypical CP. She recently underwent a nuclear stress test on 07/05/2009 as part of her pre-operative assessment for her upcoming knee replacement with you.  Her stress test shwoed normal LV function with no evidence of ischemia or scarring. Based on these results, I feel that she she is low risk for any peri-operative cardiac complications and you can proceed to surgery without any further cardiac testing.  In the event that any difficulties arise during the peri-operative period please do not hesitate to contact me. Once again thanks for all your help.  Regards,   Arvilla Meres, MD  Appended Document: Clearance Letter letter faxed to [facility] at [phone]

## 2010-04-17 NOTE — Medication Information (Signed)
Summary: Letter Regarding Symbyax & Hyperlipidemia/[facility]  Letter Regarding Symbyax & Hyperlipidemia/[facility]   Imported By: Lanelle Bal 05/04/2009 10:39:13  _____________________________________________________________________  External Attachment:    Type:   Image     Comment:   External Document

## 2010-04-17 NOTE — Medication Information (Signed)
Summary: Letter Regarding Symbyax & Hyperlipidemia/[facility]  Letter Regarding Symbyax & Hyperlipidemia/[facility]   Imported By: Lanelle Bal 09/14/2009 15:22:00  _____________________________________________________________________  External Attachment:    Type:   Image     Comment:   External Document

## 2010-04-17 NOTE — Assessment & Plan Note (Signed)
Summary: Cardiology Nuclear Study  Nuclear Med Background Indications for Stress Test: Evaluation for Ischemia, Surgical Clearance  Indications Comments: Pending (R) TKR by Dr. Salvatore Marvel on 07/17/09  History: Myocardial Perfusion Study  History Comments: '07 MPS:no ischemia  Symptoms: DOE    Nuclear Pre-Procedure Cardiac Risk Factors: Hypertension, Lipids, Obesity Caffeine/Decaff Intake: None NPO After: 9:30 PM Lungs: Clear.  O2 Sat 97% on RA. IV 0.9% NS with Angio Cath: 24g     IV Site: (R) AC IV Started by: Irean Hong RN Chest Size (in) 42     Cup Size C     Height (in): 67 Weight (lb): 248 BMI: 38.98 Tech Comments: (R) carotid bruit. Patient held propranolol this am.  Nuclear Med Study 1 or 2 day study:  2 day     Stress Test Type:  Eugenie Birks Reading MD:  Arvilla Meres, MD     Referring MD:  Arvilla Meres, MD Resting Radionuclide:  Technetium 58m Tetrofosmin     Resting Radionuclide Dose:  33 mCi  Stress Radionuclide:  Technetium 72m Tetrofosmin     Stress Radionuclide Dose:  33 mCi   Stress Protocol   Lexiscan: 0.4 mg   Stress Test Technologist:  Rea [employer] CMA-N     Nuclear Technologist:  Domenic Polite CNMT  Rest Procedure  Myocardial perfusion imaging was performed at rest 45 minutes following the intravenous administration of Myoview Technetium 41m Tetrofosmin.  Stress Procedure  The patient received IV Lexiscan 0.4 mg over 15-seconds.  Myoview injected at 30-seconds.  There were no significant changes with lexiscan, isolated PVC.  Quantitative spect images were obtained after a 45 minute delay.  QPS Raw Data Images:  Normal; no motion artifact; normal heart/lung ratio. Stress Images:  There is normal uptake in all areas. Rest Images:  Normal homogeneous uptake in all areas of the myocardium. Subtraction (SDS):  Normal Transient Ischemic Dilatation:  .92  (Normal <1.22)  Lung/Heart Ratio:  .29  (Normal <0.45)  Quantitative Gated Spect  Images QGS EDV:  107 ml QGS ESV:  29 ml QGS EF:  73 % QGS cine images:  Normal  Findings Normal nuclear study      Overall Impression  Exercise Capacity: Lexiscan study with no exercise. ECG Impression: Baseline: NSR; No significant ST segment change with Lexiscan. Overall Impression: Normal stress nuclear study.  Appended Document: Cardiology Nuclear Study ok  Appended Document: Cardiology Nuclear Study pt aware, letter for surg clearance faxed

## 2010-04-17 NOTE — Progress Notes (Signed)
Summary: sleep med//[place]  Phone Note From Other Clinic   Caller: Rosemarie Beath nurse [phone] Summary of Call: pt has TCA knee replacement on 07/17/09; and very uncomfortable not pain wise; she is up and down  cant get comfortable and we are requesting sleep med for a few days,  pt has tried tylenol for sleep , Benadryl which has not helped. p t daughter Ander Slade [phone] --We have asked Dr Salvatore Marvel who is refusing to call in sleep med.  pain is not the issue.  pt uses [facility] on [address]  .Kandice Hams  Aug 03, 2009 4:50 PM  Initial call taken by: Kandice Hams,  Aug 03, 2009 4:50 PM  Follow-up for Phone Call        spoke with home health nurse--- pt was having problems with lethargy and sleepiness after surgery so ortho did not want to give her any extra sleep meds.  Home health nurse agreed as well as do I.  Pt can save an ativan for pm and take that at night.  Pt needs to stay awake in daytime and not nap.  She will notify pt and family Follow-up by: Loreen Freud DO,  Aug 03, 2009 5:24 PM

## 2010-04-17 NOTE — Assessment & Plan Note (Signed)
Summary: sur/knee replacement/jss    Visit Type:  Pre-op Evaluation Primary Provider:  Laury Axon  CC:  no complaints.  History of Present Illness: Tis a 77 year old, African American female, patient, who is here for preop cardiac clearance before undergoing a right knee replacement by Dr. Rodman Key Jul 17, 2009.  The patient has a long history of nonspecific chest pain, and has had a negative Myoview back in 2007. She left eye Dr.Bensimhon in November 2010 at which time he stated she would need a stress Myoview prior to undergoing any type of surgery. She is morbidly obese and has limited mobility. She has hypertension, hyperlipidemia, and history of chest pain.  The patient denies recent complaints of chest pain. She did walk in here with a cane and help from her daughter. She denies dyspnea, dyspnea on exertion, orthopnea, paroxysmal nocturnal dyspnea, dizziness, or presyncope.  Current Medications (verified): 1)  Propranolol Hcl 40 Mg  Tabs (Propranolol Hcl) .Marland Kitchen.. 1 To 2 Once Daily As Needed 2)  Lorazepam 1 Mg  Tabs (Lorazepam) .... 3 Tabs Daily As Needed 3)  Exforge 10-320 Mg Tabs (Amlodipine Besylate-Valsartan) .Marland Kitchen.. 1 By Mouth Once Daily 4)  Lasix 40 Mg Tabs (Furosemide) .Marland Kitchen.. 1 By Mouth Once Daily 5)  Endocet 5-325 Mg Tabs (Oxycodone-Acetaminophen) .Marland Kitchen.. 1 Tab By Mouth Qid As Needed For Pain 6)  Pravachol 40 Mg  Tabs (Pravastatin Sodium) .... Take 2 Tabs By Mouth Once Daily At Bedtime**labs Due Now** 7)  Symbyax 12-50 Mg Caps (Olanzapine-Fluoxetine Hcl) .Marland Kitchen.. 1 By Mouth Once Daily  Allergies: 1)  ! Zoloft 2)  ! Codeine 3)  ! Pcn 4)  ! Morphine Sulfate (Morphine Sulfate)  Past History:  Past Medical History: Last updated: 01/16/2009 Benign Familial Tremor Gallstones CHEST PAIN-UNSPECIFIED     --Myoview negative 2007 SKIN RASH EDEMA  OTHER URINARY INCONTINENCE CHRONIC PAIN SYNDROME  BRUISE  SCOLIOSIS, LUMBAR SPINE  BACK PAIN, THORACIC REGION, RIGHT  MENISCUS TEAR  OTHER  SYMPTOMS REFERABLE TO LOWER LEG JOINT  MYALGIA  CALF PAIN, RIGHT  HYPOKALEMIA, HX OF DEPRESSION  ALLERGIC DRUG REACTION, OPIOIDS  TINEA CORPORIS  OSTEOARTHRITIS HYPERTENSION  Family Hx of HX, FAMILY, STROKE  Family Hx of PARKINSON'S DISEASE, FAMILY HX   Review of Systems       see history of present illness  Vital Signs:  Patient profile:   77 year old female Height:      67 inches Weight:      250 pounds Pulse rate:   56 / minute Pulse rhythm:   regular BP sitting:   122 / 78  (right arm)  Vitals Entered By: Jacquelin Hawking, CMA (June 29, 2009 11:46 AM)  Physical Exam  General:  Morbidly obese, in no acute distress. Neck: right carotid bruit No JVD, HJR, or thyroid enlargement Lungs: No tachypnea, clear without wheezing, rales, or rhonchi Cardiovascular: RRR, PMI not displaced, heart sounds normal, no murmurs, gallops, bruit, thrill, or heave. Abdomen: BS normal. Soft without organomegaly, masses, lesions or tenderness. Extremities: trace of edema in her ankles,without cyanosis, clubbing . Good distal pulses bilateral SKin: Warm, no lesions or rashes  Musculoskeletal: decreased mobility in her bilateral knees. Walks with a cane Neuro: no focal signs    Impression & Recommendations:  Problem # 1:  PREOPERATIVE EXAMINATION (ICD-V72.84) Patient is scheduled to undergo right knee replacement, Jul 17, 2009 by Dr. Salvatore Marvel. They requested preop cardiac clearance. Patient has long history of nonspecific chest pain and has had a negative Myoview scan back in  2007. We will repeat an adenosine Myoview prior to surgical clearance. She also has a right carotid bruit and we will check carotid Dopplers. Orders: EKG w/ Interpretation (93000)  Problem # 2:  CAROTID BRUIT, RIGHT (ICD-785.9) carotid Dopplers Orders: Carotid Duplex (Carotid Duplex)  Problem # 3:  HYPERTENSION (ICD-401.9) controlled Her updated medication list for this problem includes:    Propranolol Hcl 40  Mg Tabs (Propranolol hcl) .Marland Kitchen... 1 to 2 once daily as needed    Exforge 10-320 Mg Tabs (Amlodipine besylate-valsartan) .Marland Kitchen... 1 by mouth once daily    Lasix 40 Mg Tabs (Furosemide) .Marland Kitchen... 1 by mouth once daily  Problem # 4:  CHEST PAIN-UNSPECIFIED (ICD-786.50) Patient denies recent complaints of chest pain Her updated medication list for this problem includes:    Propranolol Hcl 40 Mg Tabs (Propranolol hcl) .Marland Kitchen... 1 to 2 once daily as needed    Exforge 10-320 Mg Tabs (Amlodipine besylate-valsartan) .Marland Kitchen... 1 by mouth once daily  Other Orders: Nuclear Stress Test (Nuc Stress Test)  Patient Instructions: 1)  Your physician recommends that you schedule a follow-up appointment in: 2 weeks with Dr. Gala Romney if abnormal stress test 2)  Your physician has requested that you have an adenosine myoview.  For further information please visit [email or web address]  Please follow instruction sheet, as given. Next week please 3)  Your physician has requested that you have a carotid duplex. This test is an ultrasound of the carotid arteries in your neck. It looks at blood flow through these arteries that supply the brain with blood. Allow one hour for this exam. There are no restrictions or special instructions.

## 2010-04-17 NOTE — Progress Notes (Signed)
Summary: clearance letter   Phone Note From Other Clinic   Caller: nurse sherry Summary of Call: Per Cordelia Pen Dr Thurston Hole of[facility] needs letter of clearance for surgery. Procedure is scheduled for 5/2. ofc [phone] fax [phone] Initial call taken by: Edman Circle,  July 11, 2009 9:24 AM  Follow-up for Phone Call        [facility] aware - pt had a normal nuc study.  Dr Gala Romney to be in office 07/12/2009 and we will send a letter then. Follow-up by: Charolotte Capuchin, RN,  July 11, 2009 9:50 AM  Additional Follow-up for Phone Call Additional follow up Details #1::        pt aware test ok and clearance letter faxed to [facility] at [phone] Meredith Staggers, RN  July 13, 2009 8:15 AM

## 2010-04-17 NOTE — Consult Note (Signed)
Summary: [facility]  [facility]   Imported By: Lanelle Bal 04/07/2009 13:18:02  _____________________________________________________________________  External Attachment:    Type:   Image     Comment:   External Document

## 2010-04-17 NOTE — Assessment & Plan Note (Signed)
Summary: surgical clearence/kdc   Vital Signs:  Patient profile:   77 year old female Weight:      253 pounds O2 Sat:      95 % on Room air Pulse rate:   84 / minute Pulse rhythm:   regular BP sitting:   130 / 74  (left arm) Cuff size:   regular  Vitals Entered By: Army Fossa CMA (June 22, 2009 1:35 PM)  O2 Flow:  Room air CC: Pt here for surgical clearnace having a total knee replacement., Pre-op Evaluation   History of Present Illness:  Pre-op Evaluation      I was asked to see this delightful patient today for Pre-op Evaluation.  Pt here for presurgical clearance for R Total knee replacement on Jul 17, 2009 with Dr Thurston Hole.  The patient denies respiratory symptoms, GI bleeding, chest pain, edema, PND, heavy ETOH use, and smoking.  Patient has no history of acute or recent MI, unstable or severe angina, decompensated CHF, high grade AV block, symptomatic ventricular arrhythmia, and severe valvular disease.  Positive PMH placing the patient at moderate risk for surgery includes advanced age(l).  Patient has no history of mild angina(m), previous MI(m), compensated CHF(m), diabetes(m), renal insufficiency(m), abnormal ECG(l), rhythm other than sinus(l), low functional capacity(l), stroke history(l), and uncontrolled HTN(l).  There is no history of antiplatelet agents, chronic steroids, warfarin, diabetes meds, antianginal meds, bleeding disorder, and FH anesthesia reaction.    Preventive Screening-Counseling & Management  Alcohol-Tobacco     Alcohol drinks/day: 0     Smoking Status: never  Current Medications (verified): 1)  Propranolol Hcl 40 Mg  Tabs (Propranolol Hcl) .Marland Kitchen.. 1 To 2 Once Daily As Needed 2)  Lorazepam 1 Mg  Tabs (Lorazepam) .... 3 Tabs Daily As Needed 3)  Exforge 10-320 Mg Tabs (Amlodipine Besylate-Valsartan) .Marland Kitchen.. 1 By Mouth Once Daily 4)  Lasix 40 Mg Tabs (Furosemide) .Marland Kitchen.. 1 By Mouth Once Daily 5)  Endocet 5-325 Mg Tabs (Oxycodone-Acetaminophen) .Marland Kitchen.. 1 Tab By  Mouth Qid As Needed For Pain 6)  Pravachol 40 Mg  Tabs (Pravastatin Sodium) .... Take 2 Tabs By Mouth Once Daily At Bedtime**labs Due Now** 7)  Lidoderm 5 % Ptch (Lidocaine) .... As Directed 8)  Symbyax 12-50 Mg Caps (Olanzapine-Fluoxetine Hcl) .Marland Kitchen.. 1 By Mouth Once Daily 9)  Klor-Con M20 20 Meq Cr-Tabs (Potassium Chloride Crys Cr) .... Take 1 By Mouth Once Daily  Allergies: 1)  ! Zoloft 2)  ! Codeine 3)  ! Pcn 4)  ! Morphine Sulfate (Morphine Sulfate)  Past History:  Past Medical History: Last updated: 01/16/2009 Benign Familial Tremor Gallstones CHEST PAIN-UNSPECIFIED     --Myoview negative 2007 SKIN RASH EDEMA  OTHER URINARY INCONTINENCE CHRONIC PAIN SYNDROME  BRUISE  SCOLIOSIS, LUMBAR SPINE  BACK PAIN, THORACIC REGION, RIGHT  MENISCUS TEAR  OTHER SYMPTOMS REFERABLE TO LOWER LEG JOINT  MYALGIA  CALF PAIN, RIGHT  HYPOKALEMIA, HX OF DEPRESSION  ALLERGIC DRUG REACTION, OPIOIDS  TINEA CORPORIS  OSTEOARTHRITIS HYPERTENSION  Family Hx of HX, FAMILY, STROKE  Family Hx of PARKINSON'S DISEASE, FAMILY HX   Past Surgical History: Last updated: 11/29/2007 Hysterectomy Rotator cuff repair-right lumbar laminectomy - remote Lumbar L3-4, 4-5 decopmpressive laminectoy, L5 hemilaminectomy with leopard cage, internal fixation     L3-5   07 Atrhoscopy knees- bilateral  Family History: Last updated: 11/29/2007 Parkinson's disease CVA  Social History: Last updated: 11/29/2007 Married Very supportive daughter Had been independent in ADLs Never Smoked Alcohol use-no  Risk Factors: Alcohol Use:  0 (06/22/2009)  Risk Factors: Smoking Status: never (06/22/2009)  Family History: Reviewed history from 11/29/2007 and no changes required. Parkinson's disease CVA  Social History: Reviewed history from 11/29/2007 and no changes required. Married Very supportive daughter Had been independent in ADLs Never Smoked Alcohol use-no  Review of Systems      See  HPI  Physical Exam  General:  Well-developed,well-nourished,in no acute distress; alert,appropriate and cooperative throughout examinationoverweight-appearing.   Head:  Normocephalic and atraumatic without obvious abnormalities. No apparent alopecia or balding. Eyes:  pupils equal, pupils round, and pupils reactive to light.   Ears:  External ear exam shows no significant lesions or deformities.  Otoscopic examination reveals clear canals, tympanic membranes are intact bilaterally without bulging, retraction, inflammation or discharge. Hearing is grossly normal bilaterally. Nose:  External nasal examination shows no deformity or inflammation. Nasal mucosa are pink and moist without lesions or exudates. Mouth:  Oral mucosa and oropharynx without lesions or exudates.  Teeth in good repair. Neck:  No deformities, masses, or tenderness noted. Lungs:  Normal respiratory effort, chest expands symmetrically. Lungs are clear to auscultation, no crackles or wheezes. Heart:  normal rate and no murmur.   Abdomen:  Bowel sounds positive,abdomen soft and non-tender without masses, organomegaly or hernias noted. Msk:  Weak R knee all other joints strong  Pulses:  R posterior tibial normal, R dorsalis pedis normal, L posterior tibial normal, and L dorsalis pedis normal.   Extremities:  2+ left pedal edema and 2+ right pedal edema.   Neurologic:  alert & oriented X3.   walking with cane Skin:  Intact without suspicious lesions or rashes Cervical Nodes:  No lymphadenopathy noted Psych:  Oriented X3, normally interactive, and subdued.     Impression & Recommendations:  Problem # 1:  PREOPERATIVE EXAMINATION (ICD-V72.84)  pt clearned for R knee replacement medically cardiac clearance pending Pt only has trouble with n/v with anesthesia  Orders: EKG w/ Interpretation (93000)  Problem # 2:  DEPRESSION (ICD-311)  Her updated medication list for this problem includes:    Lorazepam 1 Mg Tabs  (Lorazepam) .Marland KitchenMarland KitchenMarland KitchenMarland Kitchen 3 tabs daily as needed    symbyax  Problem # 3:  OSTEOARTHRITIS (ICD-715.90)  Her updated medication list for this problem includes:    Endocet 5-325 Mg Tabs (Oxycodone-acetaminophen) .Marland Kitchen... 1 tab by mouth qid as needed for pain  Problem # 4:  HYPERTENSION (ICD-401.9)  Her updated medication list for this problem includes:    Propranolol Hcl 40 Mg Tabs (Propranolol hcl) .Marland Kitchen... 1 to 2 once daily as needed    Exforge 10-320 Mg Tabs (Amlodipine besylate-valsartan) .Marland Kitchen... 1 by mouth once daily    Lasix 40 Mg Tabs (Furosemide) .Marland Kitchen... 1 by mouth once daily  BP today: 130/74 Prior BP: 130/70 (01/16/2009)  Labs Reviewed: K+: 4.2 (12/01/2008) Creat: : 1.1 (12/01/2008)   Chol: 191 (03/21/2008)   HDL: 55.6 (03/21/2008)   LDL: 122 (03/21/2008)   TG: 67 (03/21/2008)  Complete Medication List: 1)  Propranolol Hcl 40 Mg Tabs (Propranolol hcl) .Marland Kitchen.. 1 to 2 once daily as needed 2)  Lorazepam 1 Mg Tabs (Lorazepam) .... 3 tabs daily as needed 3)  Exforge 10-320 Mg Tabs (Amlodipine besylate-valsartan) .Marland Kitchen.. 1 by mouth once daily 4)  Lasix 40 Mg Tabs (Furosemide) .Marland Kitchen.. 1 by mouth once daily 5)  Endocet 5-325 Mg Tabs (Oxycodone-acetaminophen) .Marland Kitchen.. 1 tab by mouth qid as needed for pain 6)  Pravachol 40 Mg Tabs (Pravastatin sodium) .... Take 2 tabs by mouth once daily at  bedtime**labs due now** 7)  Lidoderm 5 % Ptch (Lidocaine) .... As directed 8)  Symbyax 12-50 Mg Caps (Olanzapine-fluoxetine hcl) .Marland Kitchen.. 1 by mouth once daily 9)  Klor-con M20 20 Meq Cr-tabs (Potassium chloride crys cr) .... Take 1 by mouth once daily   EKG  Procedure date:  06/22/2009  Findings:      Normal sinus rhythm with rate of:  65 Left axis deviation.

## 2010-05-30 ENCOUNTER — Telehealth (INDEPENDENT_AMBULATORY_CARE_PROVIDER_SITE_OTHER): Payer: Self-pay | Admitting: *Deleted

## 2010-06-05 LAB — TYPE AND SCREEN: Antibody Screen: NEGATIVE

## 2010-06-05 LAB — APTT: aPTT: 27 seconds (ref 24–37)

## 2010-06-05 LAB — URINE CULTURE
Colony Count: NO GROWTH
Colony Count: 40000
Culture: NO GROWTH

## 2010-06-05 LAB — CBC
HCT: 23.1 % — ABNORMAL LOW (ref 36.0–46.0)
HCT: 27.3 % — ABNORMAL LOW (ref 36.0–46.0)
HCT: 28.6 % — ABNORMAL LOW (ref 36.0–46.0)
HCT: 33.6 % — ABNORMAL LOW (ref 36.0–46.0)
Hemoglobin: 7.9 g/dL — ABNORMAL LOW (ref 12.0–15.0)
Hemoglobin: 9.2 g/dL — ABNORMAL LOW (ref 12.0–15.0)
Hemoglobin: 9.9 g/dL — ABNORMAL LOW (ref 12.0–15.0)
MCHC: 34.1 g/dL (ref 30.0–36.0)
MCHC: 34.6 g/dL (ref 30.0–36.0)
MCV: 90.2 fL (ref 78.0–100.0)
MCV: 90.3 fL (ref 78.0–100.0)
MCV: 89.6 fL (ref 78.0–100.0)
RBC: 2.55 MIL/uL — ABNORMAL LOW (ref 3.87–5.11)
RBC: 3.02 MIL/uL — ABNORMAL LOW (ref 3.87–5.11)
RBC: 3.76 MIL/uL — ABNORMAL LOW (ref 3.87–5.11)
RDW: 13.6 % (ref 11.5–15.5)
RDW: 13.7 % (ref 11.5–15.5)
WBC: 10.1 10*3/uL (ref 4.0–10.5)
WBC: 9.9 10*3/uL (ref 4.0–10.5)
WBC: 4.4 10*3/uL (ref 4.0–10.5)

## 2010-06-05 LAB — COMPREHENSIVE METABOLIC PANEL
BUN: 20 mg/dL (ref 6–23)
CO2: 28 mEq/L (ref 19–32)
Calcium: 9.7 mg/dL (ref 8.4–10.5)
Chloride: 108 mEq/L (ref 96–112)
Creatinine, Ser: 1.09 mg/dL (ref 0.4–1.2)
GFR calc non Af Amer: 49 mL/min — ABNORMAL LOW (ref >60)
Total Bilirubin: 0.7 mg/dL (ref 0.3–1.2)

## 2010-06-05 LAB — URINALYSIS, ROUTINE W REFLEX MICROSCOPIC
Glucose, UA: NEGATIVE mg/dL
Glucose, UA: NEGATIVE mg/dL
Ketones, ur: NEGATIVE mg/dL
Ketones, ur: NEGATIVE mg/dL
Nitrite: NEGATIVE
Protein, ur: NEGATIVE mg/dL
Specific Gravity, Urine: 1.015 (ref 1.005–1.030)
Urobilinogen, UA: 0.2 mg/dL (ref 0.0–1.0)
pH: 6.5 (ref 5.0–8.0)

## 2010-06-05 LAB — BASIC METABOLIC PANEL
BUN: 13 mg/dL (ref 6–23)
BUN: 16 mg/dL (ref 6–23)
CO2: 27 mEq/L (ref 19–32)
Calcium: 8.4 mg/dL (ref 8.4–10.5)
Chloride: 104 mEq/L (ref 96–112)
Chloride: 106 mEq/L (ref 96–112)
Chloride: 108 mEq/L (ref 96–112)
GFR calc Af Amer: >60 mL/min (ref >60)
GFR calc Af Amer: >60 mL/min (ref >60)
GFR calc non Af Amer: 53 mL/min — ABNORMAL LOW (ref >60)
GFR calc non Af Amer: 53 mL/min — ABNORMAL LOW (ref >60)
Glucose, Bld: 104 mg/dL — ABNORMAL HIGH (ref 70–99)
Glucose, Bld: 105 mg/dL — ABNORMAL HIGH (ref 70–99)
Potassium: 4 mEq/L (ref 3.5–5.1)
Potassium: 4.2 mEq/L (ref 3.5–5.1)
Potassium: 4.6 mEq/L (ref 3.5–5.1)
Sodium: 138 mEq/L (ref 135–145)
Sodium: 138 mEq/L (ref 135–145)
Sodium: 140 mEq/L (ref 135–145)

## 2010-06-05 LAB — DIFFERENTIAL
Basophils Absolute: 0 10*3/uL (ref 0.0–0.1)
Eosinophils Relative: 5 % (ref 0–5)
Lymphocytes Relative: 36 % (ref 12–46)
Neutro Abs: 2 10*3/uL (ref 1.7–7.7)
Neutrophils Relative %: 46 % (ref 43–77)

## 2010-06-05 LAB — PROTIME-INR
INR: 1.36 (ref 0.00–1.49)
INR: 1.04 (ref 0.00–1.49)
Prothrombin Time: 13.5 seconds (ref 11.6–15.2)

## 2010-06-05 LAB — URINE MICROSCOPIC-ADD ON

## 2010-06-05 NOTE — Progress Notes (Signed)
Summary: Prior Auth Symbyax [employer] NOT NEEDED   Phone Note Refill Request   Refills Requested: Medication #1:  SYMBYAX 12-50 MG CAPS 1 by mouth once daily* OFFICE VISIT WITH LABS-- DUE NOW*. Pharmacy must contact pharmacy help desk to get med override per [employer] representative.Marland KitchenMarland KitchenMarland KitchenAustin Eye Laser And [facility] Deloach CMA  May 30, 2010 4:30 PM   [phone]. Pharmacy notified via phone call and fax.............Marland KitchenFelecia Deloach CMA  May 30, 2010 4:51 PM

## 2010-06-27 ENCOUNTER — Encounter: Payer: Self-pay | Admitting: Family Medicine

## 2010-06-29 ENCOUNTER — Other Ambulatory Visit: Payer: Self-pay | Admitting: Family Medicine

## 2010-07-03 ENCOUNTER — Encounter: Payer: Self-pay | Admitting: Family Medicine

## 2010-07-03 ENCOUNTER — Ambulatory Visit (INDEPENDENT_AMBULATORY_CARE_PROVIDER_SITE_OTHER): Payer: Medicare Other | Admitting: Family Medicine

## 2010-07-03 VITALS — BP 122/62 | Temp 98.8°F | Wt 261.0 lb

## 2010-07-03 DIAGNOSIS — Z01818 Encounter for other preprocedural examination: Secondary | ICD-10-CM

## 2010-07-03 NOTE — Progress Notes (Signed)
Subjective:    [name] is a 77 y.o. female who presents to the office today for a preoperative consultation at the request of surgeon Dr Wyline Mood  who plans on performing L Total knee replacement on August 20, 2010. This consultation is requested for the specific conditions prompting preoperative evaluation (i.e. because of potential affect on operative risk): Marland Kitchen Planned anesthesia: general. The patient has the following known anesthesia issues: past general anesthesia with complications (nausea and vomiting). Patients bleeding risk: no recent abnormal bleeding and use of Ca-channel blockers (see med list). The following portions of the patient's history were reviewed and updated as appropriate: allergies, current medications, past family history, past medical history, past social history, past surgical history and problem list.  Review of Systems A comprehensive review of systems was negative except for: Musculoskeletal: positive for L knee pain    Objective:    BP 122/62  Temp(Src) 98.8 F (37.1 C) (Oral)  Wt 261 lb (118.389 kg) General appearance: alert, cooperative, appears stated age and no distress Ears: normal TM's and external ear canals both ears Nose: Nares normal. Septum midline. Mucosa normal. No drainage or sinus tenderness. Throat: lips, mucosa, and tongue normal; teeth and gums normal Neck: no adenopathy, no carotid bruit, no JVD, supple, symmetrical, trachea midline and thyroid not enlarged, symmetric, no tenderness/mass/nodules Lungs: clear to auscultation bilaterally Heart: regular rate and rhythm, S1, S2 normal, no murmur, click, rub or gallop Abdomen: soft, non-tender; bowel sounds normal; no masses,  no organomegaly Extremities: extremities normal, atraumatic, no cyanosis or edema Pulses: 2+ and symmetric Skin: Skin color, texture, turgor normal. No rashes or lesions Lymph nodes: Cervical, supraclavicular, and axillary nodes normal. Neurologic: Grossly  normal  Predictors of intubation difficulty:  Morbid obesity? yes -   Anatomically abnormal facies? no  Prominent incisors? no  Receding mandible? no  Short, thick neck? no  Neck range of motion: normal  Dentition: upper dentures  Cardiographics ECG: a fib/ flutter Echocardiogram: not done  Imaging Chest x-ray: not done   Lab Review  No visits with results within 6 Month(s) from this visit. Latest known visit with results is:  Admission on 07/17/2009, Discharged on 07/20/2009  Component Date Value  . ABO/RH(D) 07/17/2009 A POS   . Antibody Screen 07/17/2009 NEG   . Sample Expiration 07/17/2009 07/20/2009   . Unit Number 07/17/2009 [id number]   . Blood Component Type 07/17/2009 RED CELLS,LR   . Status of Unit 07/17/2009 ISSUED,FINAL   . Transfusion Status 07/17/2009 OK TO TRANSFUSE   . Crossmatch Result 07/17/2009 Compatible   . Unit Number 07/17/2009 [id number]   . Blood Component Type 07/17/2009 RED CELLS,LR   . Status of Unit 07/17/2009 ISSUED,FINAL   . Transfusion Status 07/17/2009 OK TO TRANSFUSE   . Crossmatch Result 07/17/2009 Compatible   . ABO/RH(D) 07/17/2009 A POS   . Sodium 07/18/2009 138   . Potassium 07/18/2009 4.2   . Chloride 07/18/2009 106   . CO2 07/18/2009 27   . Glucose, Bld 07/18/2009 120*  . BUN 07/18/2009 16   . Creatinine, Ser 07/18/2009 1.02   . Calcium 07/18/2009 8.6   . GFR calc non Af Amer 07/18/2009 53*  . GFR calc Af Amer 07/18/2009                     Value:>60  The eGFR has been calculated                         using the MDRD equation.                         This calculation has not been                         validated in all clinical                         situations.                         eGFR's persistently                         <60 mL/min signify                         possible Chronic Kidney Disease.  . WBC 07/18/2009 10.1   . RBC 07/18/2009 3.02*  . Hemoglobin 07/18/2009 9.2*  .  Hematocrit 07/18/2009 27.3*  . MCV 07/18/2009 90.2   . MCHC 07/18/2009 33.6   . RDW 07/18/2009 13.8   . Platelets 07/18/2009 177   . Prothrombin Time 07/18/2009 14.9   . INR 07/18/2009 1.18   . Sodium 07/19/2009 140   . Potassium 07/19/2009 4.6   . Chloride 07/19/2009 108   . CO2 07/19/2009 27   . Glucose, Bld 07/19/2009 104*  . BUN 07/19/2009 9   . Creatinine, Ser 07/19/2009 1.02   . Calcium 07/19/2009 8.4   . GFR calc non Af Amer 07/19/2009 53*  . GFR calc Af Amer 07/19/2009                     Value:>60                                The eGFR has been calculated                         using the MDRD equation.                         This calculation has not been                         validated in all clinical                         situations.                         eGFR's persistently                         <60 mL/min signify                         possible Chronic Kidney Disease.  . WBC 07/19/2009 8.5   . RBC 07/19/2009 2.55*  . Hemoglobin 07/19/2009 7.9*  . Hematocrit 07/19/2009 23.1*  . MCV 07/19/2009 90.9   . MCHC 07/19/2009 34.1   .  RDW 07/19/2009 13.7   . Platelets 07/19/2009 145*  . Prothrombin Time 07/19/2009 16.7*  . INR 07/19/2009 1.36   . Order Confirmation 07/19/2009 ORDER PROCESSED BY BLOOD BANK   . Sodium 07/20/2009 138   . Potassium 07/20/2009 4.0   . Chloride 07/20/2009 104   . CO2 07/20/2009 28   . Glucose, Bld 07/20/2009 105*  . BUN 07/20/2009 13   . Creatinine, Ser 07/20/2009 1.03   . Calcium 07/20/2009 8.8   . GFR calc non Af Amer 07/20/2009 53*  . GFR calc Af Amer 07/20/2009                     Value:>60                                The eGFR has been calculated                         using the MDRD equation.                         This calculation has not been                         validated in all clinical                         situations.                         eGFR's persistently                         <60 mL/min  signify                         possible Chronic Kidney Disease.  . WBC 07/20/2009 9.9   . RBC 07/20/2009 3.17*  . Hemoglobin 07/20/2009 9.9 POST TRANSFUSION SPECIMEN*  . Hematocrit 07/20/2009 28.6*  . MCV 07/20/2009 90.3   . MCHC 07/20/2009 34.7   . RDW 07/20/2009 13.6   . Platelets 07/20/2009 162   . Prothrombin Time 07/20/2009 15.2   . INR 07/20/2009 1.21   . WBC 07/11/2009 4.4   . RBC 07/11/2009 3.76*  . Hemoglobin 07/11/2009 11.6*  . Hematocrit 07/11/2009 33.6*  . MCV 07/11/2009 89.6   . MCHC 07/11/2009 34.6   . RDW 07/11/2009 13.8   . Platelets 07/11/2009 256   . Sodium 07/11/2009 141   . Potassium 07/11/2009 4.3   . Chloride 07/11/2009 108   . CO2 07/11/2009 28   . Glucose, Bld 07/11/2009 90   . BUN 07/11/2009 20   . Creatinine, Ser 07/11/2009 1.09   . Calcium 07/11/2009 9.7   . Total Protein 07/11/2009 6.7   . Albumin 07/11/2009 4.0   . AST 07/11/2009 17   . ALT 07/11/2009 14   . Alkaline Phosphatase 07/11/2009 102   . Total Bilirubin 07/11/2009 0.7   . GFR calc non Af Amer 07/11/2009 49*  . GFR calc Af Amer 07/11/2009 *                   Value:60  The eGFR has been calculated                         using the MDRD equation.                         This calculation has not been                         validated in all clinical                         situations.                         eGFR's persistently                         <60 mL/min signify                         possible Chronic Kidney Disease.  Marland Kitchen Neutrophils Relative 07/11/2009 46   . Neutro Abs 07/11/2009 2.0   . Lymphocytes Relative 07/11/2009 36   . Lymphs Abs 07/11/2009 1.6   . Monocytes Relative 07/11/2009 12   . Monocytes Absolute 07/11/2009 0.5   . Eosinophils Relative 07/11/2009 5   . Eosinophils Absolute 07/11/2009 0.2   . Basophils Relative 07/11/2009 1   . Basophils Absolute 07/11/2009 0.0   . Prothrombin Time 07/11/2009 13.5   . INR 07/11/2009 1.04    . aPTT 07/11/2009 27   . Color, Urine 07/11/2009 YELLOW   . Appearance 07/11/2009 CLEAR   . Specific Gravity, Urine 07/11/2009 1.022   . pH 07/11/2009 6.0   . Glucose, UA 07/11/2009 NEGATIVE   . Hgb urine dipstick 07/11/2009 NEGATIVE   . Bilirubin Urine 07/11/2009 NEGATIVE   . Ketones 07/11/2009 NEGATIVE   . Protein 07/11/2009 NEGATIVE   . Urobilinogen, UA 07/11/2009 0.2   . Nitrite 07/11/2009 NEGATIVE   . Leukocytes, UA 07/11/2009 SMALL*  . Squamous Epithelial / LPF 07/11/2009 FEW*  . WBC, UA 07/11/2009 3-6   . RBC / HPF 07/11/2009 0-2   . Bacteria, UA 07/11/2009 RARE   . Casts 07/11/2009 HYALINE CASTS*  . Specimen Description 07/11/2009 URINE, CLEAN CATCH   . Special Requests 07/11/2009 NONE   . Colony Count 07/11/2009 40,000 COLONIES/ML   . Culture 07/11/2009 Multiple bacterial morphotypes present, none predominant. Suggest appropriate recollection if clinically indicated.   . Report Status 07/11/2009 07/12/2009 FINAL       Assessment:      77 y.o. female with planned surgery as above.   Known risk factors for perioperative complications: None a fib/ flutter   Difficulty with intubation is not anticipated.     Plan:    1. Preoperative workup as follows ECG, hemoglobin, hematocrit, electrolytes, creatinine, glucose, liver function studies, coagulation studies--per preop labs 2. Change in medication regimen before surgery: none, continue medication regimen including morning of surgery, with sip of water. 3. Prophylaxis for cardiac events with perioperative beta-blockers: should be considered, specific regimen per anesthesia. 4. Invasive hemodynamic monitoring perioperatively: at the discretion of anesthesiologist. 5. Deep vein thrombosis prophylaxis postoperatively:regimen to be chosen by surgical team. 6. Surveillance for postoperative MI with ECG immediately postoperatively and on postoperative days 1 and 2 AND troponin levels 24 hours postoperatively and on day 4 or  hospital discharge (whichever comes first): at the discretion of anesthesiologist. 7. Other measures: pre op cardiac clearance  Pt will see Dr Teressa Lower in the next week o

## 2010-07-18 ENCOUNTER — Encounter: Payer: Self-pay | Admitting: Internal Medicine

## 2010-07-23 ENCOUNTER — Ambulatory Visit (INDEPENDENT_AMBULATORY_CARE_PROVIDER_SITE_OTHER): Payer: Medicare Other | Admitting: Internal Medicine

## 2010-07-23 ENCOUNTER — Encounter: Payer: Self-pay | Admitting: Internal Medicine

## 2010-07-23 VITALS — BP 134/68 | HR 62 | Ht 66.0 in | Wt 261.8 lb

## 2010-07-23 DIAGNOSIS — R079 Chest pain, unspecified: Secondary | ICD-10-CM

## 2010-07-23 DIAGNOSIS — Z01810 Encounter for preprocedural cardiovascular examination: Secondary | ICD-10-CM | POA: Insufficient documentation

## 2010-07-23 NOTE — Assessment & Plan Note (Signed)
Based on recent Myoview and ability to tolerate recent R TKA without CV issues; I feel she is low-risk for peri-op cardiac complications and can proceed to surgery without further cardiac work-up.

## 2010-07-23 NOTE — Progress Notes (Signed)
HPI:  [name] is a 77 year old woman with a history hypertension, atypical chest pain with a normal nuclear study, morbid obesity, hyperlipidemia, chronic back and hip painand severe depression. Represents today at the request of Dr. Thurston Hole for pre-op CV clearance for left knee replacement on June 4.  Had Myoview in 4/11 prior to R TKA. Normal LV function and normal perfusion. Tolerated surgery well without problem.   Doing well from a cardiac standpoint.  No CP. Mild chronic dyspnea.  Walking limited due to back and hip pain; walks slowly with a walker.No chest pain or pressure. Mild dyspnea. No palpitations or CHF symtpoms.     ROS: All other systems normal except as mentioned in HPI, past medical history and problem list.    Past Medical History  Diagnosis Date  . Scoliosis   . Meniscus tear   . Depression   . Hypertension   . Osteoarthritis   . Myalgia   . Calf pain   . Hypokalemia   . Other symptoms referable to lower leg joint   . Back pain, thoracic   . Bruise   . Chronic pain syndrome   . Other urinary incontinence   . Edema   . Skin rash   . Gallstones   . Chest pain     -- Myoview negative 2007  . Benign familial tremor   . Allergic drug reaction     Opioids  . Tinea corporis     Current Outpatient Prescriptions  Medication Sig Dispense Refill  . acetaminophen (TYLENOL) 500 MG tablet Take 500 mg by mouth every 6 (six) hours as needed.        Marland Kitchen amLODipine-valsartan (EXFORGE) 10-320 MG per tablet Take 1 tablet by mouth daily.        . carbidopa-levodopa (SINEMET) 25-100 MG per tablet 1 tablet 4 (four) times daily.       . SYMBYAX 12-50 MG per capsule take 1 capsule by mouth once daily .  30 capsule  0  . DISCONTD: furosemide (LASIX) 40 MG tablet Take 40 mg by mouth daily.        Marland Kitchen DISCONTD: LORazepam (ATIVAN) 1 MG tablet Take 1 mg by mouth 3 (three) times daily as needed.        Marland Kitchen DISCONTD: oxyCODONE-acetaminophen (ENDOCET) 5-325 MG per tablet Take 1 tablet  by mouth 4 (four) times daily as needed.        Marland Kitchen DISCONTD: pravastatin (PRAVACHOL) 40 MG tablet Take 80 mg by mouth at bedtime.        Marland Kitchen DISCONTD: propranolol (INDERAL) 40 MG tablet Take 40 mg by mouth daily as needed.        Marland Kitchen DISCONTD: traMADol (ULTRAM) 50 MG tablet          Allergies  Allergen Reactions  . Codeine   . Latex     REACTION: Reaction not known  . Morphine Sulfate   . Penicillins   . Sertraline Hcl     History   Social History  . Marital Status: Married    Spouse Name: N/A    Number of Children: 3  . Years of Education: 59   Occupational History  . retired--administator [employer]   Social History Main Topics  . Smoking status: Never Smoker   . Smokeless tobacco: Never Used  . Alcohol Use: No  . Drug Use: No  . Sexually Active: No   Other Topics Concern  . Not on file   Social History Narrative  .  No narrative on file    Family History  Problem Relation Age of Onset  . Stroke Mother   . Parkinsonism Mother     PHYSICAL EXAM: Filed Vitals:   07/23/10 1155  BP: 134/68  Pulse: 62    General:  Obese. Ambulates slowly with walker. No distress HEENT: normal Neck: supple. no JVD. Carotids 2+ bilat; no bruits. No lymphadenopathy or thryomegaly appreciated. Cor: PMI nondisplaced. Regular rate & rhythm. +s4 Lungs: clear Abdomen: obese soft, nontender, nondistended.  No bruits or masses. Good bowel sounds. Extremities: no cyanosis, clubbing, rash, edema Neuro: alert & oriented x 3, cranial nerves grossly intact. moves all 4 extremities w/o difficulty. Affect pleasant.  ECG:  SR 62 No ST-T wave abnormalities.    ASSESSMENT & PLAN:

## 2010-07-23 NOTE — Assessment & Plan Note (Signed)
Resolved. Myoview reassuring. No further work-up at this point.

## 2010-07-26 ENCOUNTER — Encounter (HOSPITAL_COMMUNITY)
Admission: RE | Admit: 2010-07-26 | Discharge: 2010-07-26 | Disposition: A | Discharge status: Home / Self Care | Payer: Medicare Other | Source: Ambulatory Visit | Attending: Orthopedic Surgery | Admitting: Orthopedic Surgery

## 2010-07-26 ENCOUNTER — Ambulatory Visit (HOSPITAL_COMMUNITY)
Admission: RE | Admit: 2010-07-26 | Discharge: 2010-07-26 | Disposition: A | Discharge status: Home / Self Care | Payer: Medicare Other | Source: Ambulatory Visit | Attending: Orthopedic Surgery | Admitting: Orthopedic Surgery

## 2010-07-26 ENCOUNTER — Telehealth: Payer: Self-pay | Admitting: Internal Medicine

## 2010-07-26 ENCOUNTER — Other Ambulatory Visit (HOSPITAL_COMMUNITY): Payer: Self-pay | Admitting: Orthopedic Surgery

## 2010-07-26 DIAGNOSIS — M25569 Pain in unspecified knee: Secondary | ICD-10-CM | POA: Insufficient documentation

## 2010-07-26 DIAGNOSIS — Z01818 Encounter for other preprocedural examination: Secondary | ICD-10-CM | POA: Insufficient documentation

## 2010-07-26 DIAGNOSIS — Z01812 Encounter for preprocedural laboratory examination: Secondary | ICD-10-CM | POA: Insufficient documentation

## 2010-07-26 DIAGNOSIS — I10 Essential (primary) hypertension: Secondary | ICD-10-CM | POA: Insufficient documentation

## 2010-07-26 LAB — COMPREHENSIVE METABOLIC PANEL
BUN: 17 mg/dL (ref 6–23)
CO2: 30 mEq/L (ref 19–32)
Chloride: 102 mEq/L (ref 96–112)
Creatinine, Ser: 0.86 mg/dL (ref 0.4–1.2)
GFR calc non Af Amer: >60 mL/min (ref >60)
Glucose, Bld: 100 mg/dL — ABNORMAL HIGH (ref 70–99)
Total Bilirubin: 0.4 mg/dL (ref 0.3–1.2)

## 2010-07-26 LAB — APTT: aPTT: 28 seconds (ref 24–37)

## 2010-07-26 LAB — CBC
MCV: 88.1 fL (ref 78.0–100.0)
Platelets: 285 10*3/uL (ref 150–400)
RBC: 4.2 MIL/uL (ref 3.87–5.11)
WBC: 5 10*3/uL (ref 4.0–10.5)

## 2010-07-26 LAB — DIFFERENTIAL
Eosinophils Absolute: 0.2 10*3/uL (ref 0.0–0.7)
Lymphocytes Relative: 41 % (ref 12–46)
Lymphs Abs: 2 10*3/uL (ref 0.7–4.0)
Neutrophils Relative %: 45 % (ref 43–77)

## 2010-07-26 LAB — URINE MICROSCOPIC-ADD ON

## 2010-07-26 LAB — URINALYSIS, ROUTINE W REFLEX MICROSCOPIC
Bilirubin Urine: NEGATIVE
Nitrite: POSITIVE — AB
Specific Gravity, Urine: 1.015 (ref 1.005–1.030)
pH: 6.5 (ref 5.0–8.0)

## 2010-07-26 LAB — SURGICAL PCR SCREEN
MRSA, PCR: NEGATIVE
Staphylococcus aureus: NEGATIVE

## 2010-07-26 NOTE — Telephone Encounter (Signed)
Faxed Echo to [employer] (per [place]) @ [facility] ([phone]).

## 2010-07-28 LAB — URINE CULTURE

## 2010-07-29 ENCOUNTER — Other Ambulatory Visit: Payer: Self-pay | Admitting: Family Medicine

## 2010-07-30 ENCOUNTER — Inpatient Hospital Stay (HOSPITAL_COMMUNITY)
Admission: RE | Admit: 2010-07-30 | Discharge: 2010-08-07 | DRG: 469 | Disposition: A | Discharge status: Skilled Nursing Facility | Payer: Medicare Other | Source: Ambulatory Visit | Attending: Orthopedic Surgery | Admitting: Orthopedic Surgery

## 2010-07-30 DIAGNOSIS — Z79899 Other long term (current) drug therapy: Secondary | ICD-10-CM

## 2010-07-30 DIAGNOSIS — Z6841 Body Mass Index (BMI) 40.0 and over, adult: Secondary | ICD-10-CM

## 2010-07-30 DIAGNOSIS — D62 Acute posthemorrhagic anemia: Secondary | ICD-10-CM | POA: Diagnosis not present

## 2010-07-30 DIAGNOSIS — E119 Type 2 diabetes mellitus without complications: Secondary | ICD-10-CM | POA: Diagnosis present

## 2010-07-30 DIAGNOSIS — G20A1 Parkinson's disease without dyskinesia, without mention of fluctuations: Secondary | ICD-10-CM | POA: Diagnosis present

## 2010-07-30 DIAGNOSIS — Z96659 Presence of unspecified artificial knee joint: Secondary | ICD-10-CM

## 2010-07-30 DIAGNOSIS — K5900 Constipation, unspecified: Secondary | ICD-10-CM | POA: Diagnosis not present

## 2010-07-30 DIAGNOSIS — R32 Unspecified urinary incontinence: Secondary | ICD-10-CM | POA: Diagnosis present

## 2010-07-30 DIAGNOSIS — M1710 Unilateral primary osteoarthritis, unspecified knee: Principal | ICD-10-CM | POA: Diagnosis present

## 2010-07-30 DIAGNOSIS — J189 Pneumonia, unspecified organism: Secondary | ICD-10-CM | POA: Diagnosis not present

## 2010-07-30 DIAGNOSIS — J9819 Other pulmonary collapse: Secondary | ICD-10-CM | POA: Diagnosis not present

## 2010-07-30 DIAGNOSIS — I959 Hypotension, unspecified: Secondary | ICD-10-CM | POA: Diagnosis not present

## 2010-07-30 DIAGNOSIS — I10 Essential (primary) hypertension: Secondary | ICD-10-CM | POA: Diagnosis present

## 2010-07-30 DIAGNOSIS — M4120 Other idiopathic scoliosis, site unspecified: Secondary | ICD-10-CM | POA: Diagnosis present

## 2010-07-30 DIAGNOSIS — D72829 Elevated white blood cell count, unspecified: Secondary | ICD-10-CM | POA: Diagnosis not present

## 2010-07-30 DIAGNOSIS — G20 Parkinson's disease: Secondary | ICD-10-CM | POA: Diagnosis present

## 2010-07-30 DIAGNOSIS — F329 Major depressive disorder, single episode, unspecified: Secondary | ICD-10-CM | POA: Diagnosis present

## 2010-07-30 DIAGNOSIS — F3289 Other specified depressive episodes: Secondary | ICD-10-CM | POA: Diagnosis present

## 2010-07-30 LAB — URINALYSIS, ROUTINE W REFLEX MICROSCOPIC
Bilirubin Urine: NEGATIVE
Glucose, UA: NEGATIVE mg/dL
Ketones, ur: NEGATIVE mg/dL
Protein, ur: NEGATIVE mg/dL

## 2010-07-31 ENCOUNTER — Inpatient Hospital Stay (HOSPITAL_COMMUNITY): Payer: Medicare Other

## 2010-07-31 LAB — BASIC METABOLIC PANEL
BUN: 17 mg/dL (ref 6–23)
CO2: 25 mEq/L (ref 19–32)
Chloride: 109 mEq/L (ref 96–112)
Creatinine, Ser: 0.97 mg/dL (ref 0.4–1.2)
Glucose, Bld: 126 mg/dL — ABNORMAL HIGH (ref 70–99)
Potassium: 4 mEq/L (ref 3.5–5.1)

## 2010-07-31 LAB — URINALYSIS, ROUTINE W REFLEX MICROSCOPIC
Leukocytes, UA: NEGATIVE
Nitrite: NEGATIVE
Protein, ur: 30 mg/dL — AB
Specific Gravity, Urine: 1.018 (ref 1.005–1.030)
Urobilinogen, UA: 0.2 mg/dL (ref 0.0–1.0)

## 2010-07-31 LAB — CBC
HCT: 28.4 % — ABNORMAL LOW (ref 36.0–46.0)
HCT: 27.7 % — ABNORMAL LOW (ref 36.0–46.0)
Hemoglobin: 9.2 g/dL — ABNORMAL LOW (ref 12.0–15.0)
MCH: 29.3 pg (ref 26.0–34.0)
MCHC: 33.1 g/dL (ref 30.0–36.0)
MCHC: 33.2 g/dL (ref 30.0–36.0)
MCV: 89.9 fL (ref 78.0–100.0)
RDW: 14.2 % (ref 11.5–15.5)
RDW: 14.5 % (ref 11.5–15.5)

## 2010-07-31 LAB — URINE CULTURE
Colony Count: NO GROWTH
Culture: NO GROWTH

## 2010-07-31 LAB — COMPREHENSIVE METABOLIC PANEL
BUN: 15 mg/dL (ref 6–23)
CO2: 24 mEq/L (ref 19–32)
Calcium: 8.4 mg/dL (ref 8.4–10.5)
Glucose, Bld: 116 mg/dL — ABNORMAL HIGH (ref 70–99)
Sodium: 139 mEq/L (ref 135–145)
Total Bilirubin: 0.4 mg/dL (ref 0.3–1.2)
Total Protein: 6 g/dL (ref 6.0–8.3)

## 2010-07-31 LAB — URINE MICROSCOPIC-ADD ON

## 2010-07-31 LAB — PROTIME-INR: INR: 1.16 (ref 0.00–1.49)

## 2010-07-31 NOTE — Discharge Summary (Signed)
[name], [name]                ACCOUNT NO.:  [id number]   MEDICAL RECORD NO.:  [id number]          PATIENT TYPE:  INP   LOCATION:  3030                         FACILITY:  [facility]   PHYSICIAN:  Valerie A. Felicity Coyer, MDDATE OF BIRTH:  31-Jan-1934   DATE OF ADMISSION:  11/29/2007  DATE OF DISCHARGE:  12/01/2007                               DISCHARGE SUMMARY   DISCHARGE DIAGNOSES:  1. Progressive right back/buttock pain in the setting of known lumbar      spinal stenosis and spondylolisthesis and L3-L5 fusion.  2. Depression.  3. Hypertension.  4. Chronic constipation, MiraLax was added at this admission.   HISTORY OF PRESENT ILLNESS:  [name] is a 77 year old female who was  admitted on November 29, 2007, with a previous history of back surgery  for degenerative disk disease.  She notes that ever since that time, she  has had pain and is followed by Dr. Marca Ancona at [facility] for pain  management.  She is currently managed with hydrocodone and Naprosyn for  several weeks prior to this admission, the pain had been getting worse,  and she described it as a very sharp stabbing pain at the left buttock  and hip without radiation to the leg.  She denied any paresthesias or  motor weakness with her pain.  She also had recently seen Dr. Sharolyn Douglas  in consultation, at which time, she had an MRI, and he apparently  proposed additional surgery.  She was admitted for further evaluation  and treatment.   PAST MEDICAL HISTORY:  1. Hypertension.  2. Osteoarthritis.  3. Benign familial tremor.  4. Gallstones.  5. Chronic low back pain.  6. Depression.   COURSE OF HOSPITALIZATION:  1. Progressive right back/buttock pain.  The patient was admitted.      She underwent a CT of the abdomen and pelvis which did note an      enlarging hypodense right mid renal lesion, and it was felt that      the mass could not be excluded.  There was noted to be a lumbar      spondylosis/scoliosis with  fixation from L3-L5 and fusion from L5-      S1.  There was also noted to be a nonobstructive 2-mm left distal      ureteral calculus with scattered sigmoid diverticula.  Due to the      patient's pain, a Neurosurgery consult was requested, and the      patient was seen by Dr. Lovell Sheehan for [facility].  He recommended      an x-ray of the L-spine with flexion and extension, as well as      bilateral hip x-rays, and an MRI.  The L-spine showed no gross      evidence of hardware failure.  Bilateral hip x-rays showed      degenerative changes without acute findings.  MRI has not yet been      performed.  At this time, the patient states her pain is improved.      She is able to ambulate without difficulty.  We will  resume her      home pain regimen and pursue MRI this morning and after MRI is      completed, she will be discharged to home with plans for outpatient      followup with Dr. Lovell Sheehan in the office.   PERTINENT LABORATORY:  At the time of discharge, September 13, urine  culture is negative.  BUN 17, creatinine 1.04.  Hemoglobin 10.9,  hematocrit 33.   MEDICATIONS AT THE TIME OF DISCHARGE:  1. Propranolol 40 mg given 1-2 tablets once daily as needed.  2. Lorazepam 1 mg p.o. t.i.d. p.r.n.  3. Atacand 32 mg p.o. daily.  4. Hydrochlorothiazide 25 mg p.o. daily.  5. Norvasc 10 mg p.o. daily.  6. Percocet 5/325 one-half to 1 tablet p.o. q.i.d. p.r.n.  7. Trazodone 50 mg one-half tablet p.o. nightly p.r.n.  8. Pravachol 40 mg p.o. daily.  9. Lamictal as directed.  10.Seroquel 25 mg p.o. nightly.  11.Spironolactone 25 mg p.o. daily.  12.Enteric-coated Naprosyn 500 mg 1 tablet p.o. b.i.d.  13.MiraLax 17 g p.o. daily in 8 ounces of water.   DISPOSITION:  She will be discharged to home.   FOLLOWUP:  The patient is instructed to follow up with Dr. Lovell Sheehan and  contact the office for an appointment.  She is also instructed to follow  up with Dr. Laury Axon in 1-2 weeks and contact the  office for an  appointment.   Greater than 30 minutes was spent on discharge planning.      Sandford Craze, NP      Raenette Rover. Felicity Coyer, MD  Electronically Signed    MO/MEDQ  D:  12/01/2007  T:  12/02/2007  Job:  [id number]   cc:   Cristi Loron, M.D.  Lelon Perla, DO

## 2010-07-31 NOTE — Consult Note (Signed)
[name], [name] NO.:  [id number]   MEDICAL RECORD NO.:  [id number]          PATIENT TYPE:  OBV   LOCATION:  3030                         FACILITY:  [facility]   PHYSICIAN:  Cristi Loron, M.D.DATE OF BIRTH:  01/06/1934   DATE OF CONSULTATION:  11/30/2007  DATE OF DISCHARGE:                                 CONSULTATION   CHIEF COMPLAINT:  Bilateral hip pain.   HISTORY OF PRESENT ILLNESS:  The patient is a 77 year old black female,  who has had some prior lumbar problems.  She tells me she underwent a  lumbar diskectomy many years ago by Dr. Corlis Hove.  More  recently, the patient underwent a L3-L5 lumbar instrumentation and  fusion by Dr. Otelia Sergeant in 2006.  According to the patient, the surgery was  not successful.  She had persistent pain.  According to the patient,  there was some talk about additional surgery, but he gave up on me and  the patient was referred to Dr. Regino Schultze.  Dr. Regino Schultze did several injections  on her, they seemed to help, but only briefly.  She was then referred to  Dr. Marca Ancona in [facility], who did some more injections, they too  helped briefly.  The patient then saw her primary doctor, Dr. Laury Axon and  was referred to Dr. Noel Gerold.  According to the patient, Dr. Noel Gerold said she  had arthritis in her back and get x-rays and she has been actively  seeing him.   The patient had increasing pain over the last week.  She does not recall  any specific precipitating event.  The patient was brought to [facility] via private vehicle.  She was worked up with abdominal CT and  at this point a neurosurgical consultation is requested by Dr. Felicity Coyer.   Presently, the patient is accompanied by her daughters and husband.  She  complains of pain in the region over bilateral hips and buttocks.  She  denies pain radiating further down her legs.  These bother her equally.  She says she does not have much back pain.  The patient does not recall  having a recent lumbar MRI.   PAST MEDICAL HISTORY:  Positive for hypertension, hypercholesteremia,  depression, osteoarthritis, benign familial tremor, cholecystitis,  ruptured lumbar disk and stenosis as above, rotator cuff injury, and  knee osteoarthritis.   PAST SURGICAL HISTORY:  Hysterectomy, right rotator cuff repair,  bilateral knee arthroscopies, lumbar laminectomy and fusion as above.   MEDICATIONS PRIOR TO ADMISSION:  1. Propranolol 40 mg p.o. daily.  2. Lorazepam 1 mg p.o. t.i.d. p.r.n.  3. Atacand 32 mg p.o. daily.  4. Hydrochlorothiazide 25 mg p.o. daily.  5. Norvasc 10 mg p.o. daily.  6. P.r.n. hydrocodone.  7. Trazodone.  8. Pravachol 40 mg p.o. daily.  9. Lamictal.  10.Seroquel 25 mg p.o. daily.  11.Spironolactone 25 mg p.o. daily.  12.Naprosyn 500 mg b.i.d.   DRUG ALLERGIES:  ZOLOFT, CODEINE, and PENICILLIN.   FAMILY HISTORY:  Positive for Parkinson disease and cerebrovascular  accident.   SOCIAL HISTORY:  The patient is married.  She is retired from school  system.  She denies tobacco, ethanol, and drug use.   REVIEW OF SYSTEMS:  Negative except as above.   PHYSICAL EXAMINATION:  GENERAL:  A pleasant, obese 77 year old black  female in no apparent distress.  HEENT:  Normocephalic, atraumatic.  Pupils equal, round, reactive to  light.  Extraocular movements intact.  Oropharynx benign.  NECK:  Supple.  There is no mass or deformity.  She has a fairly normal  range of motion.  THORAX:  Symmetric.  LUNGS:  Clear.  ABDOMEN:  Soft, obese.  EXTREMITIES:  No deformities.  FABER testing is negative bilaterally.  BACK:  Her lumbar incision is well healed.  There is no signs of  infection.  No point tenderness.  NEUROLOGIC:  The patient is alert and oriented x3.  Cranial nerves II  through XII is examined bilaterally grossly normal.  Vision and hearing  grossly normal bilaterally.  Motor strength is 5/5 in bilateral deltoid,  biceps, triceps, psoas,  quadriceps, gastrocnemius, extensor longus.  Her  deep tendon reflexes are trace throughout biceps, triceps, quadriceps,  1/4 gastrocnemius.  There is no ankle clonus.  Cerebellar function is  intact to rapid alternating movements bilaterally.  Sensory function is  intact to light touch in all tested dermatomes bilaterally.   ASSESSMENT/PLAN:  1. Back pain.  The patient has had no dedicated studies to lumbar      spine.  I recommend lumbar MRI scan with/without contrast to rule      out degenerative changes, herniated disk, stenosis, etc. as well as      I also recommended she get lumbar spine x-rays with flexion-      extension views to rule out pseudoarthrosis.  I will arrange for      these studies.  2. Bilateral hip pain.  I will get the hip x-rays.   I gave the patient my card and she can follow up with me in office to do  all these studies.  I have answered all questions.      Cristi Loron, M.D.  Electronically Signed     JDJ/MEDQ  D:  11/30/2007  T:  12/01/2007  Job:  [id number]

## 2010-07-31 NOTE — Assessment & Plan Note (Signed)
HEALTHCARE                            CARDIOLOGY OFFICE NOTE   NAME:[name], [name]                       MRN:          [id number]  DATE:12/17/2006                            DOB:          09/27/1933    PRIMARY CARE PHYSICIAN:  Dr. Loreen Freud.   INTERVAL HISTORY:  Ms. Sedivy is a 77 year old woman with a history of  hypertension, atypical chest pain, morbid obesity, hyperlipidemia,  chronic back pain, and obesity who returns today for routine follow up.   She has had a Myoview about 2 years ago which showed no evidence of  ischemia. Her chest pain seems to correspond with periods of stress. She  did see Wende Bushy here in clinic about a month ago for chest pain  which was once again atypical and she was reassured. She also was seen  in the emergency room a couple of days ago for abdominal pain and  constipation which they thought was secondary to her oxycodone that she  was taking for her back pain. Unfortunately she has also been very  depressed as her aunt just died on 08-08-22 and had to be buried. She is  quite tearful over this. She was previously on Zoloft and Wellbutrin but  stopped this several months ago when she was feeling well.   From cardiac point of view she denies any significant chest pain. She  gets occasional twinges. She does have some chronic shortness of breath  without change.   CURRENT MEDICATIONS:  1. Atacand 32.  2. Hydrochlorothiazide 25.  3. Amlodipine 10.  4. Lorazepam p.r.n.  5. Propranolol p.r.n.   PHYSICAL EXAMINATION:  She is tearful. She ambulates around the clinic  slowly with a cane. There is no respiratory difficultly. Blood pressure  is 130/76, heart rate is 60, weight is 273.  HEENT: Normal.  NECK: Supple, unable to clearly evaluate JVD but appears flat. Carotids  are 2+ bilaterally without any bruits. There is no lymphadenopathy or  thyromegaly.  CARDIAC: PMI is not palpable. She has a regular  rate and rhythm with a  2/6 systolic ejection murmur at the left sternal border which increases  with inspiration.  LUNGS: Clear.  ABDOMEN: Obese, nontender. I am unable to appreciate any  hepatosplenomegaly. No bruits. No masses.  EXTREMITIES: Warm with no cyanosis, clubbing. She has trace to 1+ edema  bilaterally. No rash. She does have a big ecchymosis on her left forearm  where a previous IV was.  NEURO: She is alert and oriented x3. Cranial nerves II-XII grossly  intact. Moves all 4 extremities without difficultly and she is quite  tearful and sad.   ASSESSMENT/PLAN:  1. Atypical chest pain, I suspect this is noncardiac. We did discuss      doing cardiac catheterization at some point in the future to      clearly define this given her risk factors, however we have chosen      to defer for now once again.  2. Hypertension, just on the high end of the normal range. She will      follow up  with Dr. Laury Axon.  3. Depression, I feel she is in the throes of major depression. I have      talked to her at length about any homicidal or suicidal ideation      and although she thinks sometimes she may be dying, she does not      have a passive death wish or any active plans. I have gone ahead      and restarted her on her Zoloft 50 mg a day and asked her to follow      up with Dr. Laury Axon in the very near future. We have contacted for      safety and told her should she feel worsening depression and more      despair that she should call 911.   DISPOSITION:  She will return to see [place] in clinic in 4 or 5 months for  routine follow up.     Bevelyn Buckles. Bensimhon, MD  Electronically Signed    DRB/MedQ  DD: 12/17/2006  DT: 12/18/2006  Job #: [id number]

## 2010-07-31 NOTE — Assessment & Plan Note (Signed)
[facility]                            CARDIOLOGY OFFICE NOTE   NAME:BRIDGESJoury, [name]                       MRN:          [id number]  DATE:12/30/2007                            DOB:          03-31-1933    PRIMARY CARE PHYSICIAN:  Lelon Perla, DO   INTERVAL HISTORY:  [name] is a 77 year old woman with a history  hypertension, atypical chest pain with a normal nuclear study, morbid  obesity, hyperlipidemia, chronic back and hip pain, and severe  depression; returns today for routine followup.   She says she is not doing well.  She continues to have significant pain  in her hips as well as severe depression.  She says her pain medicine  did not agreeing with her, they make her queasy and lightheaded.  She  just feels miserable.  She continues to have occasional chest heaviness,  but this is not changed at all from previous.  She does also have  exertional dyspnea, which is also unchanged.  She has not had any  orthopnea, no PND, and no lower extremity edema.  She is quite  distraught today in the office.   CURRENT MEDICATIONS:  1. HCTZ 25 a day.  2. Amlodipine 10 a day.  3. Naprosyn p.r.n.  4. Spirolactone 25 a day.  5. Atacand 32 a day.  6. Lamotrigine.  7. Oxycodone.  8. Seroquel p.r.n.   PHYSICAL EXAMINATION:  GENERAL:  She is upset.  She ambulates around the  clinic slowly with no respiratory distress.  VITAL SIGNS:  Blood pressure is 130/98, heart rate 57, weights 248,  which is down 20 pounds.  HEENT:  Normal.  NECK:  Supple and thick.  Unable to clearly see JVD, but appears flat.  Carotids are 2+ bilaterally without any bruits.  There is no  lymphadenopathy or thyromegaly.  CARDIAC:  PMI is not palpable.  She is regular rate and rhythm with S4  and 2/6 systolic ejection murmur at the left sternal border.  LUNGS:  Clear.  ABDOMEN:  Obese, nontender, and nondistended.  No obvious  hepatosplenomegaly.  No bruits and no masses.   Good bowel sounds.  EXTREMITIES:  Warm with no cyanosis, clubbing, or edema.  No rash.  NEURO:  Alert and oriented x3.  Cranial nerves II through XII are  intact.  She moves all fours without difficulty.  Affect is mildly  distraught.   ASSESSMENT AND PLAN:  1. Chronic chest pain and shortness of breath.  This is unchanged.      She previously had a normal nuclear study.  EKG just shows LVH with      no significant changes.  We will continue just risk factor      management.  2. Hypertension is followed by Dr. Laury Axon.  3. Depression.  This has been a chronic problem for her.  We will      refer her to the [facility] for evaluation.   DISPOSITION:  We will see her back in a year for routine followup.     Bevelyn Buckles. Bensimhon, MD  Electronically  Signed    DRB/MedQ  DD: 12/30/2007  DT: 12/30/2007  Job #: [id number]   cc:   [facility]

## 2010-07-31 NOTE — Assessment & Plan Note (Signed)
[name]                            CARDIOLOGY OFFICE NOTE   NAME:[name], [name] WORRALL                       MRN:          [id number]  DATE:11/12/2006                            DOB:          03/14/1934    This is a patient of Dr. Gala Romney.  [name] is a 77 year old  African-American female patient of Dr. Gala Romney who walked in to make  an appointment for a followup, and mentioned that she has been having  chest pain and was added on to my schedule.  She has a history of  atypical chest pain, morbid obesity, hypertension, and status post  negative Myoview with normal left ventricular function back in 2006.  Today she states she has had a several-week history of sharp, stabbing  chest pains that wake her up about 2 or 3 in the morning, and are short  lived, but leave her feeling fatigued and odd feeling.  She says this  happens maybe every other night.  She denies any chest heaviness,  shortness of breath at the time, dizziness, palpitations, or presyncope.  She does have some dyspnea on exertion which has not changed.   CURRENT MEDICATIONS:  1. Lorazepam 1 mg 1-2 a day.  2. Atacand 300 daily.  3. Hydrochlorothiazide 25 daily.  4. Propranolol 40 mg 1-2 daily.  5. Norvasc 10 mg daily.   PHYSICAL EXAMINATION:  This is an obese 77 year old African-American  female in no acute distress.  Blood pressure 142/71, pulse 63, weight was not done.  NECK:  Without JVD, hepatojugular reflux, bruit or thyroid enlargement.  LUNGS:  Clear anterior, posterior, and lateral.  HEART:  Regular rate and rhythm at 60 beats per minute, normal S1 and  S2.  Distant heart sounds.  No murmur, rub, bruit, thrill or heave  noted.  ABDOMEN:  Obese, normoactive bowel sounds are heard throughout.  EXTREMITIES:  Trace of ankle edema, no cyanosis or clubbing.  Positive  distal pulses.   EKG sinus bradycardia with incomplete right bundle branch block and left  ventricular  hypertrophy.  No acute change.   IMPRESSION:  1. Chest pain, atypical.  Has had a negative Myoview in 2006.  2. Hypertension.  3. Bradycardia, stable.  4. Hyperlipidemia.  5. Morbid obesity.  6. History of depression.  7. History of chronic back and arm pain.  8. History of gastroesophageal reflux disease.   PLAN:  The patient is stable from a cardiac standpoint.  At this time, I  feel like her chest pain is atypical and not cardiac related, and we  will have her see Dr. Gala Romney at her regular, scheduled followup.  She  is to call if she has any further problems in the interim.      Jacolyn Reedy, PA-C  Electronically Signed      Luis Abed, MD, [facility]  Electronically Signed   ML/MedQ  DD: 11/12/2006  DT: 11/13/2006  Job #: [id number]

## 2010-07-31 NOTE — Assessment & Plan Note (Signed)
[facility]                            CARDIOLOGY OFFICE NOTE   NAME:[name], [name]                       MRN:          [id number]  DATE:04/17/2007                            DOB:          1933/12/23    PRIMARY CARE PHYSICIAN:  Dr. Loreen Freud   INTERVAL HISTORY:  Ms. [name] is 77 year old woman with history of  hypertension, atypical chest pain with a normal nuclear study, morbid  obesity, hyperlipidemia, chronic back pain, and severe depression who  returns today for routine followup.   She returns today for routine followup.  From a chest pain perspective,  she is doing fairly well.  She gets a little bit of chest heaviness  while she is eating but does not have any exertional chest heaviness.  She has chronic shortness of breath on exertion but this is unchanged.  She is able to do all her daily activities.  She feels it is just  secondary to her weight.  She remains tearful over her husband, who  requires a lot of care.  She was recently started on Celexa.  She denies  any orthopnea.  No PND, no significant lower extremity edema.   CURRENT MEDICATIONS:  1. Atacand 32 mg a day.  2. Hydrochlorothiazide 25 a day.  3. Lorazepam p.r.n.  4. Propranolol p.r.n.  5. Amlodipine 10 a day.  6. Lamotrigine 100 a day.  7. Naproxen 550 b.i.d.  8. Seroquel.  9. Percocet.   PHYSICAL EXAMINATION:  She is tearful.  She ambulates around the clinic  slowly with no significant respiratory distress.  Blood pressure is  170/82, heart rate 59, weight 268.  HEENT:  Normal.  NECK:  Supple.  Unable to evaluate JVD clearly.  Carotids are 2+  bilateral without any bruits.  There is no lymphadenopathy or  thyromegaly.  CARDIAC:  PMI is nonpalpable.  She has a regular rate and rhythm with  2/6 systolic ejection murmur at the left sternal border which increases  with inspiration.  LUNGS:  Clear.  ABDOMEN:  Obese, nontender, unable to adequately assess for any  hepatosplenomegaly.  No bruits, no masses.  Good bowel sounds.  EXTREMITIES:  Warm with no cyanosis, clubbing.  She has trivial edema.  No rash.  NEURO:  Alert and oriented x3.  Cranial nerves II-XII are intact.  Moves  all four extremities without difficulty.  Affect is sad.   ASSESSMENT AND PLAN:  1. Chest pain.  This is very atypical.  She had a normal nuclear study      sometime ago, would not pursue further at this time.  2. Hypertension.  Blood pressure is significantly elevated.  I have      taken the liberty to start her on spironolactone 25 mg once a day.      She will have labs drawn next week and follow up with Dr. Laury Axon.      We talked about the need for diet and exercise.  It would also be      nice if she can limit the naproxen but this seems to be necessary  for her back pain.  3. Obesity.  As above, we talked about the need for diet and weight      loss.   DISPOSITION:  She is stable from a cardiac point-of-view.  Will see her  back in 9 months for followup.     Bevelyn Buckles. Bensimhon, MD  Electronically Signed    DRB/MedQ  DD: 04/17/2007  DT: 04/18/2007  Job #: [id number]   cc:   Lelon Perla, DO

## 2010-08-01 ENCOUNTER — Inpatient Hospital Stay (HOSPITAL_COMMUNITY): Payer: Medicare Other

## 2010-08-01 LAB — CBC
Hemoglobin: 7.8 g/dL — ABNORMAL LOW (ref 12.0–15.0)
MCH: 29.4 pg (ref 26.0–34.0)
MCHC: 33.3 g/dL (ref 30.0–36.0)
MCV: 88.3 fL (ref 78.0–100.0)

## 2010-08-01 LAB — COMPREHENSIVE METABOLIC PANEL
ALT: 5 U/L (ref 0–35)
CO2: 24 mEq/L (ref 19–32)
Calcium: 8.3 mg/dL — ABNORMAL LOW (ref 8.4–10.5)
Chloride: 105 mEq/L (ref 96–112)
Creatinine, Ser: 0.88 mg/dL (ref 0.4–1.2)
GFR calc non Af Amer: >60 mL/min (ref >60)
Glucose, Bld: 117 mg/dL — ABNORMAL HIGH (ref 70–99)
Total Bilirubin: 0.6 mg/dL (ref 0.3–1.2)

## 2010-08-01 LAB — PREPARE RBC (CROSSMATCH)

## 2010-08-01 LAB — PROTIME-INR: Prothrombin Time: 17.3 seconds — ABNORMAL HIGH (ref 11.6–15.2)

## 2010-08-01 NOTE — Consult Note (Signed)
NAMEDEMMI, SINDT                ACCOUNT NO.:  [id number]  MEDICAL RECORD NO.:  [id number]           PATIENT TYPE:  O  LOCATION:  XRAY                         FACILITY:  [facility]  PHYSICIAN:  Conley Canal, MD      DATE OF BIRTH:  1934/02/29  DATE OF CONSULTATION: DATE OF DISCHARGE:  07/26/2010                                CONSULTATION   REQUESTING PHYSICIAN:  Dr. Cleophas Dunker  PRIMARY ATTENDING:  Dr. Salvatore Marvel.  PRIMARY CARE PHYSICIAN:  Dr. Laury Axon.  REASON FOR CONSULTATION:  Pneumonia.  HISTORY OF PRESENT ILLNESS:  I was asked by Dr. Cleophas Dunker to see this 17- year-old female who has a history of depression, Parkinson disease, essential hypertension, end-stage degenerative joint disease status post left total knee replacement on May 14 who was noted to have fever and cough, which apparently started yesterday and had a chest x-ray done this evening, which suggested right upper lobe and left lower lobe patchy air space opacities suspicious for early mild to focal pneumonia, hence request for management by the Hospitalist Service.  At the time of my evaluation, the patient is quite sedated, although she is arousable hence she is not able to give me a coherent history.  However, her daughter who is at the bedside filled in some of the details, from what I could gather, the patient had been doing well before she came in for the procedure yesterday, but she had been given an antibiotic on May 11 for bladder infection, and she completed the antibiotic yesterday, but apparently she had been asymptomatic prior to the UA.  From lab results, her urine culture was negative yesterday and urine culture on May 10 grew E. coli sensitive to quinolones, Bactrim, cephalosporins.  I am not sure what antibiotic she was given.  There is no history for vomiting since admission and apparently her left total knee replacement was uneventful. She was noted to have fever of 101.6 around 2:00 p.m. today  and the latest one around 10:00 p.m. was 102 degrees Fahrenheit.  She received vancomycin prior to surgery yesterday.  Otherwise, the patient denies any other complaints other than some neck pain, which she says may be due to position in bed.  She denies any diarrhea.  No dysuria.  No headache.  PAST MEDICAL HISTORY: 1. Hypertension. 2. Parkinson disease. 3. Depression. 4. End-stage degenerative joint disease status post left total knee     replacement.  FAMILY HISTORY:  Positive for hypertension.  ALLERGIES:  Penicillin contain sertraline, morphine sulfate.  REVIEW OF SYSTEMS:  Unremarkable except as highlighted in the history of present illness.  CURRENT MEDICATIONS:  Amlodipine, Sinemet, Colace, Lovenox, magnesium hydroxide, olanzapine, Benicar, normal saline, Tylenol, Dulcolax, Vicodin, Dilaudid, Robaxin, Reglan, Zofran, oxycodone, Senokot.  PHYSICAL EXAMINATION:  GENERAL:  This is a toxic-looking elderly female who is somnolent, most likely due to pain medications.  VITALS:  Blood pressure 114/73, heart rate 102, temperature 102 degrees Fahrenheit, respirations 24, oxygen saturation 96% on room air. HEAD, EARS, NOSE AND THROAT:  Pupils equal, reacting to light.  No jugular venous distention.  No carotid bruits. RESPIRATORY SYSTEM:  Bilateral  air entry, some scattered rhonchi, both right and left lungs. CARDIOVASCULAR SYSTEM:  First and second heart sounds heard.  No murmurs.  Regular tachycardia.  ABDOMEN:  Soft and nontender.  No palpable organomegaly.  Bowel sounds normal. EXTREMITIES:  No pedal edema, left leg in immobilizer.  No overt bleeding. CNS:  Grossly intact.  The patient is somnolent but arousable, seems lethargic, responds to questions appropriately, follows commands.  LABORATORY DATA:  WBC 10.8, hemoglobin 9.2, hematocrit 27.7, platelet count 180.  Sodium 139, potassium 4.3, BUN 15, creatinine 0.98, AST 42, ALT of less than 5.  Of note, his urine culture  on May 10, grew E. coli which is pansensitive and a followup urine culture on May 14 showed no growth.  Chest x-ray shows right upper lobe and left lower lobe patchy air-space opacities suspicious for early mild-to-focal neuro pneumonia and low lung volumes.  EKG shows sinus tachycardia, ventricular rate around 105.  No significant ST-T wave changes.  IMPRESSION:  This is a 77 year old female being managed for left total knee replacement who has multilobar pneumonia.  The differentials include aspiration pneumonia versus possible community-acquired pneumonia, although community-acquired pneumonia would seem unlikely as she did not have symptoms prior to admission.  On the other hand, she required treatment for Escherichia coli urinary tract infection just before the admission, which could still raise possibility of ongoing infection prior to coming into the hospital, as she was probably partially treated for pneumonia by antibiotics given for the Escherichia coli urinary tract infection. The pneumonia is less likely related to nosocomial organisms as she is day#2 in the hospital.  RECOMMENDATIONS: 1. Multi lobar pneumonia, aspiration versus partially treated     community-acquired pneumonia.  We would place the patient on     clindamycin and vancomycin to complete 7 days as she is allergic to     penicillin, would obtain blood cultures x2 sets.  Sputum culture     and sensitivity.  Give oxygen and bronchodilators as needed.     Continue incentive spirometry and pulmonary toilet. 2. Recently treated E. coli urinary tract infection.  Not sure what     antibiotics the patient received outpatient, but this seems to have     cleared infection.  In any case, the patient will be on broad     spectrum antibiotics for the pneumonia. 3. Parkinson disease.  Continue current medications. 4. Anemia, most likely multifactorial including hemodilutional and     probable blood loss, seems  hemodynamically stable.  May need PRBC     transfusion defer decision to Orthopedics. 5. Osteoarthritis status post left total knee replacement.  Management     per Orthopedics. 6. Essential hypertension.  Blood pressure seems generally controlled,     monitor. 7. DVT and GI prophylaxis. 8. Depression, seems controlled.  Continue Symbyax.     Conley Canal, MD     SR/MEDQ  D:  08/01/2010  T:  08/01/2010  Job:  [id number]  cc:   Dr. Laury Axon Dr. Cleophas Dunker  Electronically Signed by Conley Canal  on 08/01/2010 03:40:32 AM

## 2010-08-02 ENCOUNTER — Inpatient Hospital Stay (HOSPITAL_COMMUNITY): Payer: Medicare Other

## 2010-08-02 LAB — URINALYSIS, ROUTINE W REFLEX MICROSCOPIC
Ketones, ur: NEGATIVE mg/dL
Nitrite: NEGATIVE
Protein, ur: 30 mg/dL — AB
Urobilinogen, UA: 0.2 mg/dL (ref 0.0–1.0)

## 2010-08-02 LAB — URINE MICROSCOPIC-ADD ON

## 2010-08-02 LAB — TYPE AND SCREEN
ABO/RH(D): A POS
Antibody Screen: NEGATIVE
Unit division: 0

## 2010-08-02 LAB — CBC
Hemoglobin: 9.2 g/dL — ABNORMAL LOW (ref 12.0–15.0)
MCHC: 33.9 g/dL (ref 30.0–36.0)
Platelets: 162 10*3/uL (ref 150–400)
RBC: 3.13 MIL/uL — ABNORMAL LOW (ref 3.87–5.11)

## 2010-08-02 LAB — BASIC METABOLIC PANEL
BUN: 12 mg/dL (ref 6–23)
Calcium: 8.2 mg/dL — ABNORMAL LOW (ref 8.4–10.5)
GFR calc non Af Amer: >60 mL/min (ref >60)
Glucose, Bld: 129 mg/dL — ABNORMAL HIGH (ref 70–99)
Potassium: 3.5 mEq/L (ref 3.5–5.1)

## 2010-08-02 LAB — PROTIME-INR
INR: 1.26 (ref 0.00–1.49)
Prothrombin Time: 16 seconds — ABNORMAL HIGH (ref 11.6–15.2)

## 2010-08-03 ENCOUNTER — Inpatient Hospital Stay (HOSPITAL_COMMUNITY): Payer: Medicare Other

## 2010-08-03 LAB — DIFFERENTIAL
Basophils Absolute: 0 K/uL (ref 0.0–0.1)
Basophils Relative: 0 % (ref 0–1)
Eosinophils Absolute: 0.6 K/uL (ref 0.0–0.7)
Eosinophils Relative: 5 % (ref 0–5)
Lymphocytes Relative: 22 % (ref 12–46)
Lymphs Abs: 2.7 K/uL (ref 0.7–4.0)
Monocytes Absolute: 2.1 K/uL — ABNORMAL HIGH (ref 0.1–1.0)
Monocytes Relative: 17 % — ABNORMAL HIGH (ref 3–12)
Neutro Abs: 6.9 K/uL (ref 1.7–7.7)
Neutrophils Relative %: 56 % (ref 43–77)

## 2010-08-03 LAB — BASIC METABOLIC PANEL WITH GFR
BUN: 12 mg/dL (ref 6–23)
CO2: 25 meq/L (ref 19–32)
Calcium: 8.5 mg/dL (ref 8.4–10.5)
Chloride: 106 meq/L (ref 96–112)
Creatinine, Ser: 0.78 mg/dL (ref 0.4–1.2)
GFR calc non Af Amer: >60 mL/min
Glucose, Bld: 101 mg/dL — ABNORMAL HIGH (ref 70–99)
Potassium: 3.8 meq/L (ref 3.5–5.1)
Sodium: 138 meq/L (ref 135–145)

## 2010-08-03 LAB — CBC
HCT: 26.2 % — ABNORMAL LOW (ref 36.0–46.0)
Hemoglobin: 8.9 g/dL — ABNORMAL LOW (ref 12.0–15.0)
MCH: 29.4 pg (ref 26.0–34.0)
RBC: 3.03 MIL/uL — ABNORMAL LOW (ref 3.87–5.11)

## 2010-08-03 LAB — PROTIME-INR: INR: 1.12 (ref 0.00–1.49)

## 2010-08-03 NOTE — Op Note (Signed)
NAMEAMICA, [name]                ACCOUNT NO.:  [id number]   MEDICAL RECORD NO.:  [id number]          PATIENT TYPE:  INP   LOCATION:  2550                         FACILITY:  [facility]   PHYSICIAN:  Kerrin Champagne, M.D.   DATE OF BIRTH:  04/27/1933   DATE OF PROCEDURE:  02/06/2005  DATE OF DISCHARGE:                                 OPERATIVE REPORT   DIAGNOSES:  1.  Severe lumbar spinal stenosis, L3-4 and L4-5, mild L2-3.  2.  Left foraminal entrapment with synovial cyst.  3.  Grade 1 degenerative spondylolisthesis, L3-4.  4.  Grade 1 degenerative spondylolisthesis, L4-5.   POSTOPERATIVE DIAGNOSES:  1.  Severe lumbar spinal stenosis, L3-4 and L4-5, mild L2-3.  2.  Left foraminal entrapment with synovial cyst.  3.  Grade 1 degenerative spondylolisthesis, L3-4.  4.  Grade 1 degenerative spondylolisthesis, L4-5.   PROCEDURE:  1.  Central decompressive laminectomy, L3-4, L4-5.  2.  Left L5 hemilaminectomy with left L5 bone graft.  3.  Transverse lumbar interbody fusion, L4-5, on the left side using an 8-mm      LEOPARD cage with local bone graft.  4.  Posterolateral fusion, L3 to L5, using a combination of local and      Symphony bone graft material.  5.  Internal fixation from L3 to L5 posteriorly with pedicle screws and      rods.   SURGEON:  Kerrin Champagne, MD   ASSISTANT:  Maud Deed, PA-C   ANESTHESIA:  General via orotracheal intubation, Dr. Ivin Booty.   ESTIMATED BLOOD LOSS:  1200 mL.   SPECIMENS:  Cyst from the left L5-S1 Foramen was sent for pathology.   COMPLICATIONS:  None.   BRIEF CLINICAL HISTORY:  The patient is a 77 year old female with  progressive worsening neurogenic claudication, difficulty with ambulation  even 50 feet before having to sit down in order to relieve her pain.  She  has severe lumbar spinal stenosis associated with the L3-4 and L4-5 levels  where the thecal sac is at critical levels.  She also has apparent foraminal  entrapment secondary to a  synovial cyst as well as hypertrophic changes  here.  She is brought to the operating room after attempts at conservative  management were entirely unsuccessful in relieving her discomfort or  improving her activity level.   INTRAOPERATIVE FINDINGS:  As above.   DESCRIPTION OF PROCEDURE:  After adequate general anesthesia, the patient  was transferred to the operating room table; a Jean Rosenthal table was used for  radiolucent reasons.  With the arms at the side.  She had a Foley catheter  placed prior to turning.  The skin was placed under very light traction  DuraPrep solution and standard preoperative antibiotics of vancomycin for an  allergy to penicillin.  She then had draping in the usual manner.  Incision  was made extending from about L1 proximally to S1 distally, ellipsing an old  incision scar at the lower of the incision, through the skin and  subcutaneous layers down to the lumbodorsal fascia.  Lumbodorsal fascia was  then incised in the  midline and carried over both aspects of the spinous  processes of L2, L3, L4, L5 and S1.  Clamps were placed at the L3 and L4  levels.  These were marked for permanent identification purposes.   A Viper retractor was used during the case.  Exposure was then obtained over  the lateral aspects of the facet at the L2-L3 level, exposing the transverse  process at the L3 vertebral body posterolaterally.  Preserving the capsule  at the L2-3 level.  Capsule at L3-4 and L4-5 was removed using  electrocautery and exposure obtained out to the tips of the transverse  process of L4 and L5 at these levels respectively.   Each of the gutters then were packed with sponges exposing the spinous  process of L3 and of L4 and the superior third of the spinous process of L5.  Two levels.  Osteotomes were then used to resect medial facettes bilaterally  at the L3-4 and L4-5 level and also a partial 10% at the L2-3 level.   Two- and 3-mm Kerrisons were then used to  perform central laminectomy,  removing the central portions of the lamina from inferiorly at the L4-5  level, extending up superiorly to the L3-4 level and then over the L3  lamina, extending up to L2-3.  Decompressing the 4-5 level, performing  lateral recess decompression and decompressing the L4 nerve roots  bilaterally and L5 nerve roots bilaterally.  The L3 nerve roots were also  further decompressed at the L2-3 level within the lateral recess the L2-3  and carried out to the L3-4 neural foramen, resecting the overlying inferior  articular process of L3 and superior articular process of L4, decompressing  the L3 nerve root widely.  This was done until a hockey-stick neural probe  could be easily passed out the neural foramen to demonstrate their patency.  At the medial aspect similarly, the L4 nerve root was decompressed within  the lateral recess the L3-4 level and carried out the L4 neural foramen  bilaterally, decompressing and resecting the medial portion of the inferior  articular process of L4, articular process of L5 out into the neural  foramen, decompressing both nerve roots widely.  Foraminotomy was performed  over both L5 nerve roots and on the left side, an L5 hemilaminectomy was  performed using Leksell rongeur, osteotomes and  a medium-sized synovial  cyst was found to be adjacent to the L5 nerve root over its superior aspect  over the superior portion of the L5-S1 facet on the left side.  This was  removed in total and was sent for pathologic diagnosis.   Following decompression, then hemostasis was obtained, applying bone wax to  the bleeding cancellus bone surfaces and Gelfoam then packed within the  lateral recess of both sides as well as placed in the cottonoids.  Attention  turned to placement of the pedicle screws; Dr. Shelle Iron assisted with this  portion.  An awl was used to make an entry point at the intersection of midportion of the transverse process of L3 with  the superior articular  process of L3 on the left side with convergence about 10-15 degrees.  After  placing the initial entry point, then a straight handheld pedicle finder was  used to probe the pedicle.  This was then carefully sounded using a ball-tip  sounder.  There appeared to be intact cortex inferiorly.  The sounding with  the ball-tip sounder indicated the true opening of the pedicle was more  superior  within the midportion of pedicle so that a 5.5 tap was used to tap  this and a 5.5 by 45-mm screw was placed on the left side at the L3 level.  After decortication of the transverse process, Symphony bone graft and local  bone graft were placed posterolateral on the left side.  On the right side  then the L3 pedicle screw was also placed.  There was some difficulty in  placing the pedicle screws on both sides and this was noted later in values  of the L3 level, well within the pedicle; this was measured for depth and a  40-mm x 5.5 screw was placed on this side.  Then to the left side at L4,  again at the intersection of the transverse process with superior articular  process of L4, an awl was used to make an initial entry point, then a  handheld pedicle finder used to probe pedicle and this was measured for  depth and a 45 x 5.5 screw placed on the left side; on the right side, a 40  x 5.5 was placed similarly using an awl, tapping, using the ball-tipped  probe to test for patency.  Finally, the L5 screws were placed and these  obtained excellent purchase, both on the left side and the right side,  obtaining the correct degree of convergence as well as the crest side at the  midportion of the transverse process with the superior articular process of  L5 on the right as well using an awl to make an initial entry point, then  using a handheld pedicle finder to probe pedicle.   Screws were then placed on the left side; a 45-mm x 6.25 screw was placed.  On the right side, 40-mm x 6.25  was placed.  These obtained excellent  purchase.  Bone graft was placed over the transverse processes that were  decorticated at L3, L4 and L5 bilaterally and a curved quarter-inch rod,  titanium, was then carefully contoured to fit within the pedicle screws and  required cutting of the large rod and contouring appropriately.  Screws were  then laced and then the caps placed for the fasteners, fastening the  fasteners to the rod at each level, L3, L4 and L5 on both sides.  Attention  was then turned to placing and the TLIF along the left side.  A complete  facetectomy had been performed on the left side at L3-4 and L4-5, such that  the thecal sac and the L3 nerve root were able to be retracted; this  indicated that the disk space was quite open at the L3-4 level and could  accept a 9-mm distraction, so the 9-mm retractor was placed; this obtained  excellent distraction and the restoration of alignment at the L3-4 level and previous grade 1 listhesis.  A 9-mm cage was chosen and this was brought  onto the field and packed with bone graft material.  In the meantime, the  disk space was debrided on the left side using an osteotome to remove  posterior lip osteophytes, 3-mm Kerrisons and pituitary rongeurs, both with  teeth and without teeth. Curettaging the endplates of cartilaginous material  using straight upbiting curettes.  The cartilaginous endplates had been  resected down to bleeding bone surfaces, then trial reduction using an 8-mm  then 9-mm trial; 9 gave the best fit.  Bone graft material was then placed  within the intervertebral disk space obtaining some distraction between the  pedicle screws on the left  side at L3 and L4.  Bone graft after being placed  then was impacted into place using a pusher impactor.  The 9-mm LEOPARD cage  filled with bone graft material parallel to endplates was then placed over  the disk space, impacted into place and then kicked across the midline  using  the straight kicker and then the curved disk space beneath the posterior  portion of the disk space by at least 4 or 5 mm.  Entry to the 4-5 level on  the left side.  Gelfoam, thrombin-soaked, was placed on the left side at L3-  4 to obtain further hemostasis and bipolar electrocautery used to control  venous bleeder there.  At the L4-5 level, retracting the thecal sac and the  L4 nerve root, incision was made into the disk space using a 15 blade  scalpel.  Pituitary rongeurs were used to debride the disk space and disk  space distracted using an 8-mm distractor which provided for excellent  distraction; a 9-mm could not be placed.  Osteotome and mallet were used to  resect posterior lip osteophytes.  Following resection of this, then the  disk space was able to be entered and curettage using ring curettes  primarily, as large curettes could not be placed without significant amounts  of pressure.  Debridement of the endplates of cartilaginous material was  carried out using the ring curettes, both the straight and the upbiting.  When the disk space had been debrided of cartilage material back to bleeding  bone surfaces, then distraction of the disk space was obtained using a  laminar spreader between the pedicle screws at L5 and L4.  Bone graft,  Symphony material, was then placed within the intervertebral disk space and  packed into place using a pusher impactor.  Trial reduction with the 8-mm  trial showed excellent distraction of the disk space and good return of  alignment of the spine at this level.  An 8-mm cage was filled with bone  graft material using autogenous and local bone graft material.  Additional  bone graft material was placed within the disk space and impacted into place  using a trial spacer.  This was removed and then the 8-mm cage was then  placed over the disk space and carefully retracting the thecal sac and the  L4 nerve root, the cage was then impacted into  place, kicked across midline with kicker impactors and curved kicker impactors.  This subset the cage at  the L4-5 level with excellent position and alignment, at least 4 mm below  the posterior aspect of disk space.  This completed, placement of the TLIFs  at both L3-4 and L4-5.  At the L3 level then, some fasteners were attached  to the rod and turned to 80 foot pounds, carefully antitorquing the rod to  prevent its turning.  Compression was then obtained on the left side between  the fastener at the L3-L4 level and compression across the disk space  obtained and the fastener at L4 level then tightened to 80 foot pounds.  On  the right side at the fastener attachment at the L3 level to the rod and  then compression across the pedicle screws at the L3 and L4 level, with  tightening then of the fastener at the L4 level to the rod at 80 foot  pounds.  This completed compression of the L3-4 level.  Turning to the left  side again, compression obtained between L4 and L5 pedicle screw  fasteners  to the rod and final fastener at the L5 levels was then tightened to 80 foot  pounds, connecting it to the rod.  On the right side then, compression  obtained between the L4 and L5 pedicle screw fasteners and the final  fastener at the L5 level then tightened to 80 foot pounds.  This completed  compression of the disk space at L3-4 and L4-5.  Intraoperative C-arm fluoro  was obtained in AP and lateral planes, documenting pedicle screws and rods  in excellent position and alignment.  Note that each of the pedicle screws  were measured for the soft tissue resistance intraoperatively, left L3 11,  right L3 40, left L4 32, right L4 32, left L5 17, right L5 32.  This  indicated that there was plenty of soft tissue resistance.  On left side at  L3, there was noted broaching of the cortex inferiorly, as noted in the  previous context of this note, which tended to diminish the soft tissue  resistance here.   This was felt to represent a normal value  intraoperatively.  Examining the pedicle and pedicle screw, there was no  evidence of impingement within the spinal canal or also neural structures.  With irrigation then, final bone graft material was then placed  posterolaterally on the right and left sides to bolster these areas.  Careful inspection of the foramen at the L3, L4 and L5 levels demonstrated  the patency using a hockey-stick neural probe.  Thrombin-soaked Gelfoam was  placed lightly over the posterior aspect of the laminotomy region.  Medium  Hemovac drain was placed in the depth incision, exiting over the left lower  lumbar spine.  The patient then had the platelet-poor plasma sprayed over  the wound to obtain further hemostasis.  The lumbodorsal muscles were then  approximated loosely in the midline with interrupted #1 Vicryl sutures.  The  lumbodorsal fascia was approximated the spinous processes of L2 and L1  superiorly using #1 Vicryl sutures in figure-of-eight simple fashion, to the S2 and L5 level using interrupted #1 Vicryl sutures in a simple and figure-  of-eight fashion.  The lumbodorsal fascia was approximated in the midline  with interrupted figure-of-eight simple sutures of #1 Vicryl.  Deep subcu  layers were approximated with interrupted #1 and 0 Vicryl sutures, more  superficial layers with interrupted 2-0 Vicryl sutures, and the skin closed  with a running subcu stitch of 4-0 Vicryl.  Tincture of Benzoin and Steri-  Strips were applied, 4 x 4's affixed to the skin with Hypafix tape as well  as ABD.  The patient was then returned to a supine position, reactivated,  extubated and returned to the recovery room in satisfactory condition.      Kerrin Champagne, M.D.  Electronically Signed     JEN/MEDQ  D:  02/06/2005  T:  02/07/2005  Job:  [id number]

## 2010-08-03 NOTE — Discharge Summary (Signed)
[name], [name] NO.:  [id number]   MEDICAL RECORD NO.:  [id number]          PATIENT TYPE:  IPS   LOCATION:  0302                          FACILITY:  [facility]   PHYSICIAN:  [name], M.D. DATE OF BIRTH:  07-14-1933   DATE OF ADMISSION:  12/25/2006  DATE OF DISCHARGE:  12/28/2006                               DISCHARGE SUMMARY   IDENTIFICATION:  A 77 year old African American female who is admitted  on a voluntary basis stating I just want to die.   HISTORY OF PRESENT ILLNESS:  The patient presents with agitation and  tearfulness.  She has an onset of depression for approximately the past  month.  It has been worse since the death of an aunt 3 weeks ago and  even worse after starting Zoloft 50 mg daily.  Her last dose was several  days ago.  She endorses 2-3 weeks of decreased sleep (approximately 4-5  hours per night), positive anhedonia, and is unable to participate in  church activities and other social activities.  She has had increased  tearfulness, including getting shaky.  She was previously treated by Lutricia Horsfall, MD, PCP.  Her current PCP is Dr. Laury Axon.  She was previously  treated with Zoloft and Wellbutrin in the past.  She has a failed back  surgery, right rotator cuff spinal stenosis, and hysterectomy.  She is  currently on Zoloft 50 mg daily, lorazepam one every other day, and  propranolol 40 mg daily p.r.n. tremor.  She is allergic to PENICILLIN  and CODEINE.   PHYSICAL FINDINGS:  There were no acute physical or medical problems  noted.   ADMISSION LABORATORIES:  Routine chemistry profile was grossly within  normal limits except for a decreased potassium of 3 on December 27, 2006;  this increased to 4 and an increased glucose of 114.  Liver profile was  within normal limits.  TSH was within normal limits.  Urinalysis was  within normal limits.   HOSPITAL COURSE:  Upon admission, the patient was started on her home  medications of  hydrochlorothiazide 25 mg p.o. daily, propranolol 40 mg  p.o. daily p.r.n. tremor, Atacand 32 mg p.o. daily, and amlodipine 10 mg  daily.  The patient was also started on trazodone 25 mg p.o. q.h.s. with  a may repeat x1 if needed.  On December 25, 2006, she was started on  Ativan 1 mg p.o. q. 4h. p.r.n. agitation.  She was also started on  oxycodone 5/325 one-half to 1 tablet p.o. q. 6h. p.r.n. pain.  On  December 26, 2006, she was started on K-Dur 40 mEq p.o. b.i.d.  As  indicated above, her potassium level improved and was within normal  limits.  On December 26, 2006, she was placed on citalopram 10 mg p.o.  q.h.s.  On December 27, 2006, she was started on propranolol 40 mg p.o.  t.i.d. p.r.n. anxiety/tremor, instead of the dose of propranolol she had  been on.  She tolerated these medications well with no significant side  effects except for some sedation.  She initially was reserved in  session  with me.  She also had difficulty participating in unit therapeutic  groups and activities.  She was complaining of a queasy stomach.  On her  first day here, she complained of a depressed mood with passive suicidal  ideation.  She discussed her back pain.  She had had unsuccessful  surgery for spinal stenosis.  She discussed her support from her family  and stated she was sad over the death of her aunt.  As hospitalization  progressed, mental status improved.  Her family visited, were very  supportive of her.  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  She was  still having some difficulty sleeping but appetite was improving.  On  December 28, 2006, mental status improved markedly from admission status.  The patient was friendly and cooperative with good eye contact.  Speech  was normal rate and flow.  Psychomotor activity was within normal  limits.  Mood was euthymic.  Affect, wide range.  There was no suicidal  or homicidal ideation.  No thoughts of self-injurious behavior.  No  auditory or visual  hallucinations.  No paranoia or delusions.  Thoughts  were logical and goal-directed.  Thought content, no predominant theme.  Cognitive was grossly back to baseline, and it was felt the patient was  stable enough to be discharged home today.   DISCHARGE DIAGNOSES:  AXIS I:  Major depression, recurrent, and severe.  AXIS II:  None.  AXIS III:  Chronic low back pain and leg pain.  AXIS IV:  Moderate (problems with primary support group, burden of  psychiatric illness, and burden of medical illness).  AXIS V:  Global assessment of functioning was 50 upon discharge.  Global  assessment of functioning was 34 upon admission.  Global assessment of  functioning highest past year was 70.   DISCHARGE PLANS:  There were no specific activity or dietary  restrictions.   POST-HOSPITAL CARE PLANS:  This is being arranged by our case Production designer, theatre/television/film.   DISCHARGE MEDICATIONS:  1. Hydrochlorothiazide 25 mg daily.  2. Atacand as directed by primary care physician.  3. Norvasc 10 mg daily.  4. Ativan 1 mg every 6 hours p.r.n. anxiety.  5. Percocet 5/325 one-half to 1 tablet every 6 hours as needed for      pain.  6. Trazodone 50 mg one-half pill at bedtime if needed for sleep.  7. Propranolol 40 mg as directed by her primary care physician.      [name], M.D.  Electronically Signed     BHS/MEDQ  D:  01/26/2007  T:  01/27/2007  Job:  [id number]

## 2010-08-03 NOTE — H&P (Signed)
NAMESARALYNN, LANGHORST NO.:  [id number]   MEDICAL RECORD NO.:  [id number]          PATIENT TYPE:  ORB   LOCATION:  4504                         FACILITY:  [facility]   PHYSICIAN:  Erick Colace, M.D.DATE OF BIRTH:  08-Mar-1934   DATE OF ADMISSION:  02/12/2005  DATE OF DISCHARGE:                                HISTORY & PHYSICAL   Ms. [name] is a 77 year old female with history of hypertension,  degenerative disk disease with severe back pain and bilateral pain right and  left lower extremities secondary to spinal stenosis at the L3-4, L4-5, and  L5-S1 levels.  She elected to undergo central laminectomy L3-4, L4-5, and L5-  S1 with transforaminal lumbar interbody fusion L5-S1, L4-5; posterolateral  fusion L3 to L5; internal fixation with pedicle screws and rods L3 to L5;  left L5 hemilaminectomy.   Postoperative comorbidities, pain requiring additional fentanyl patch. She  was also started on Demerol.  She also had acute blood loss anemia and  underwent transfusion of 2 unit of autologous red blood cells with a post  transfusion hemoglobin of 9 and platelet count of 299,000.  She had  postoperative fevers.  Chest x-ray, UA negative.   REVIEW OF SYSTEMS:  Positive for chest pain, edema, reflux, incontinence,  back pain, insomnia, anxiety, depression.   PAST MEDICAL HISTORY:  1.  Hypertension.  2.  Right rotator cuff repair.  3.  Bilateral knee scope.  4.  Hysterectomy.  5.  Lumbar laminectomy 25 years ago.  6.  Anxiety.  7.  Depression.  8.  Obesity.   FAMILY HISTORY:  History of Parkinson's disease and CVA.   SOCIAL HISTORY:  Married, independent, limited support.  Three weeks prior  to admission had decreased mobility.  She does not smoke or drink alcohol.   FUNCTIONAL HISTORY:  Independent prior to admission.   CURRENT FUNCTIONAL STATUS:  Requires assistance for self care and mobility.   HOME MEDICATIONS:  Include Lyrica and Ultram for pain.   ALLERGIES:  Include PENICILLIN, CODEINE, and LATEX.   PHYSICAL EXAMINATION:  VITAL SIGNS:  Blood pressure 118/66, pulse 80,  respirations 20, temperature 98.8.  GENERAL:  Obese female in no acute distress, 5/5 strength bilaterally upper  extremities, 4/5 bilateral lower extremities.  Sensation is normal.  Pulses  are normal.  ABDOMEN:  Soft, nontender.  NECK:  Supple without adenopathy.  HEENT:  External ENT normal.  Dentition is good.  NEUROLOGIC:  Mood, memory, affect, and judgment are normal.   For remainder of exam, see H&P form.   IMPRESSION:  1.  Lumbar stenosis L3 to L5 with decompressive laminectomy and fusion,      postoperative day #6.  2.  Chronic low back pain.  Will discontinue Demerol.  Will discontinue      Valium.  Start Flexeril.  Decrease Lyrica to twice daily.  Add Ultram as      breakthrough on fentanyl 50 mcg patch.  3.  Deep vein thrombosis prophylaxis.  Add subcutaneous Lovenox.  4.  Hypertension.  Monitor on Avapro, Cardizem, and hydrochlorothiazide.  5.  Peripheral edema on  Lasix, hydrochlorothiazide.  Lyrica could be      contributing.  May help with reduction of dose.  6.  Multifactorial constipation.  Add Dulcolax suppository schedule. Add      Flomax.   Estimated length of stay 7 to 10 days.  Prognosis for functional improvement  is good with goals modified independent with ADLs and mobility and return to  home to live with husband.      Erick Colace, M.D.  Electronically Signed     AEK/MEDQ  D:  02/12/2005  T:  02/12/2005  Job:  [id number]   cc:   Kerrin Champagne, M.D.  Fax: [phone]   Angelena Sole, M.D. [facility]  [phone] [place]  [place] [address]

## 2010-08-03 NOTE — Discharge Summary (Signed)
[name], [name] NO.:  [id number]   MEDICAL RECORD NO.:  [id number]          PATIENT TYPE:  ORB   LOCATION:  4505                         FACILITY:  [facility]   PHYSICIAN:  Ranelle Oyster, M.D.DATE OF BIRTH:  1933-03-30   DATE OF ADMISSION:  02/12/2005  DATE OF DISCHARGE:  02/22/2005                                 DISCHARGE SUMMARY   DISCHARGE DIAGNOSES:  1.  Lumbar stenosis, L2 to L5, with radiculopathy.  2.  Peripheral edema.  3.  Orthostasis, resolved.  4.  Hypertension.  5.  Bilateral rotator cuff tendinitis.   HISTORY OF PRESENT ILLNESS:  Ms. [name] is a 77 year old female with a  history of hypertension, DJD, PVD, with severe back pain and bilateral lower  extremity pain, right greater than left, secondary to spinal stenosis, L3-4  and L4-5, and secondary to cyst, L5-S1.  She elected to undergo central  laminectomy, L4-5, L3-4, and left L5-S1 decompression and T-LIF, November 22  by Dr. Otelia Sergeant.  Postoperatively she had acute blood loss anemia requiring two  units of packed red blood cells.  She has also had issues with postoperative  fever, with a T-max of 101, and noted to have poor mobility.  Chest x-ray  checked on January 27 was negative.  UA was also noted to be negative.  The  patient's pain control remained poor, and currently she is continued on  Valium, Demerol and fentanyl patch with minimal effectiveness in pain  control.  She is noted to have impairment in her transfers and ambulation  and noted to have impairments in ADLs.  Subacute consulted for further  therapy.   PAST MEDICAL HISTORY:  1.  Hypertension.  2.  Right rotator cuff repair.  3.  Bilateral knee scope.  4.  Hysterectomy.  5.  Lumbar laminectomy 25 years ago.  6.  Anxiety.  7.  Depression.  8.  Obesity.   ALLERGIES:  PENICILLIN, CODEINE and LATEX.   SOCIAL HISTORY:  The patient is married, independent, but there has been  limited mobility for three weeks prior to  admission.  Does not use any  tobacco or alcohol.   FAMILY HISTORY:  Positive for Parkinson's disease and CVA.   HOSPITAL COURSE:  Ms. [name] was admitted to subacute on February 12, 2005, for SACU level therapies to consist of PT/OT daily.  Past admission  the patient's pain management was adjusted.  Initially she was maintained on  fentanyl patch; however, Demerol and Valium were discontinued.  Flexeril 5  mg p.o. q.8h. was added p.r.n. spasms.  Follow-up labs were done on November  29, revealing H&H of 8.9 and 26.0.  Check of electrolytes revealed sodium  139, potassium 3.1, chloride 101, CO2 31, BUN 23, creatinine 1.3, glucose  103.  The patient's diuretic was held and hypokalemia was supplemented.  Iron supplement was added for postop anemia.  Secondary to continued  complaints of poor p.o. intake, the fentanyl patch was discontinued and the  patient was started on OxyContin CR 20 mg q.12h.  Ativan 1 mg p.o. q.i.d.  was continued as  the patient on chronic benzodiazepines at home.  The  patient has had issues with bilateral shoulder pain and bilateral rotator  cuff tendinitis symptomatology, and bilateral shoulders were injected on  February 20, 2005.  The patient's neck incision was monitored along.  This  was noted to be healing well without any signs or symptoms of infection.  Patient with some complaints of frequency, urgency on December 6.  Therefore, a UA/UC was checked.  This grew out greater than 100,000 colonies  of Proteus, and the patient was discharged on Cipro 250 mg b.i.d.  The  patient's hypokalemia was followed along.  Recheck labs of December 1  revealed sodium 138, potassium 4.1, chloride 104, CO2 30, BUN 27, creatinine  1.3, glucose 110.  As patient's blood pressure stabilized out, Lasix and  hydrochlorothiazide were added back.  BPs at time of discharge ranging from  100 to 120 systolic, 50s to 16X diastolic.  During her stay in subacute, Ms.  [name]  progressed to being at modified independent level for transfers,  modified independent level for ambulating 250 feet with a rolling walker.  She was at supervision to navigate 13 steps.  She was at supervision for car  transfers.  She was modified independent for upper and lower body care,  modified independent for toileting.  She was at supervision for kitchen  management tasks.  Further follow-up therapies to include home health PT/OT  by [facility].  A home health R.N. also arranged for follow-up past  discharge.  On February 22, 2005, the patient is discharged to home.   DISCHARGE MEDICATIONS:  1.  Atacand 32 mg a day.  2.  Lyrica 50 mg b.i.d.  3.  Cartia XT 180 mg a day.  4.  OxyContin CR 20 mg p.o. b.i.d. x1 week, then 10 mg p.o. q.12h. x1 week,      then discontinue.  5.  Lasix 20 mg per day.  6.  _______ 25 mg a day.  7.  Ferrous sulfate 325 mg b.i.d.  8.  K-Dur 20 mEq a day.  9.  Ativan 1 mg q.i.d. p.r.n.  10. Flexeril 10 mg half p.o. t.i.d. p.r.n. spasm.  11. Ultram q.i.d. p.r.n. pain.  12. Cipro 250 mg b.i.d. x7 days.   ACTIVITY:  As tolerated with use of a walker.  No driving for four to six  weeks.  No strenuous activity for six weeks.   DIET:  Regular.   WOUND CARE:  Wash area with antibacterial soap and water.  Keep clean and  dry.   FOLLOW-UP:  Patient to follow up with Dr. Otelia Sergeant for postop check.  Follow up  with Dr. Amador Cunas, for routine check.  Follow up with Dr. Riley Kill as  needed.      Greg Cutter, P.A.      Ranelle Oyster, M.D.  Electronically Signed    PP/MEDQ  D:  05/08/2005  T:  05/09/2005  Job:  [id number]   cc:   Kerrin Champagne, M.D.  Fax: [phone]   Angelena Sole, M.D. [facility]  [phone] [place]  [place] [address]

## 2010-08-03 NOTE — Discharge Summary (Signed)
[name], [name] NO.:  [id number]   MEDICAL RECORD NO.:  [id number]          PATIENT TYPE:  INP   LOCATION:  5033                         FACILITY:  [facility]   PHYSICIAN:  Kerrin Champagne, M.D.   DATE OF BIRTH:  November 08, 1933   DATE OF ADMISSION:  02/06/2005  DATE OF DISCHARGE:  02/12/2005                                 DISCHARGE SUMMARY   ADMISSION DIAGNOSES:  1.  Severe lumbar spinal stenosis  L3-4 and L4-5, mild at L2-3.  2.  Left foraminal entrapment was synovial cyst.  3.  Grade I degenerative spondylolisthesis L3-4.  4.  Grade I degenerative spondylolisthesis at L4-5.  5.  History of anxiety and depression.  6.  Hypertension.  7.  Urinary incontinence.  8.  History of gallstones.  9.  LATEX ALLERGY.   DISCHARGE DIAGNOSES:  1.  Posthemorrhagic anemia  2.  Postoperative ileus.  3.  Difficult pain control   PROCEDURES:  1.  On February 06, 2005 the patient underwent central decompressive      laminectomy L3-4 and L4-5.  2.  Left L5 hemilaminectomy with left L5 graft.  3.  Transverse lumbar interbody fusion L4-5 on the left utilizing a 8 mm      leopard cage and local bone graft.  4.  Posterolateral fusion L3-L5 using combination of local and symphony bone      graft material  5.  Internal fixation from L3-L5 posteriorly with pedicle screws and rods.      This was performed by Dr. Otelia [name] assisted by [name] P.A.-C. under      general anesthesia.   CONSULTATIONS:  [facility], Dr. Thomasena Edis.   BRIEF HISTORY:  The patient is a 77 year old female with progressive  worsening neurogenic claudication and difficulty to ambulate secondary to  back and leg pain.  The patient is noted on studies to have severe lumbar  spinal stenosis associated with the L3-4 and L4-5 levels.  She was also  noted to have apparent foraminal entrapment secondary to synovial cyst as  well as hypertrophic changes at these levels with the thecal  sac being at  critical levels.  It was felt that she would require surgical intervention  and was admitted for the procedure as stated above.   BRIEF HOSPITAL COURSE:  The patient tolerated the procedure under general  anesthesia without complications.  On the first postoperative day her  Hemovac drain was discontinued and dressing changes were done daily  thereafter.  Hemoglobin dropped to 7.1 postoperatively and she was treated  with two units of packed red blood cells.  The patient was placed on ileus  precautions.  Unfortunately she did develop distension and abdominal  discomfort with absence of bowel sounds.  She was treated with Reglan IV as  well as continued IV fluids and laxatives on a daily basis.  She received  her brace from [employer] and physical therapy was initiated once the brace was  available.  She utilized an Government social research officer with physical therapy for ambulation  and gait training.  The patient was considerably slow  with her progress and  a rehab consult was obtained.  She was felt to be a suitable candidate for  inpatient rehabilitation.  During the timeframe of a waiting on a bed for  rehabilitation, she was treated for her medical issues including ileus,  which eventually resolved with use of laxatives and enemas to produce a  bowel movement.  She eventually was able to return to a regular diet.  The  Foley catheter was discontinued and the patient was able to void with  urinalysis being negative for urinary tract infection.  She did have  elevated temperatures. She was treated with incentive spirometry and  encouragement of cough and deep breathing.  On the date of discharge to the  Subacute Care Unit, the IJ catheter was removed with the tip sent for  culture. The patient had considerable difficulty with pain control.  Initially she utilized a PCA and received Dilaudid.  This was changed to  [employer] and eventually she was placed on a Duragesic patch.  A bed  did  become available at the Subacute Care Unit on February 12, 2005, and  although she continued to have some difficulty with pain control, it was  improving and she was felt stable for transfer there.  At that time she  continued to have elevated temperature as high as 101 with O2 sats at 93% on  room air, and a negative urinalysis.  Chest x-ray also obtained showed no  active disease.  Once again, as stated above, the IJ catheter which was  removed was sent for culture.  The results of course were delivered to the  rehab physicians once they were obtained, as the final culture report was  available on February 15, 2005 at which time she was on the rehab unit.  Please see their summary for further care of this issue.   PERTINENT LABORATORY VALUES:  CBC on admission with WBC 6.0, hemoglobin  10.8, hematocrit 31.7.  The patient did have posthemorrhagic anemia  requiring transfusion and documented in the chart is that the patient  receive three units of packed red blood cells during the hospital stay.  On  the time of transfer to rehab the hemoglobin was 9.0, hematocrit 25.8 with a  normal WBC of 8.4.  Chemistry studies on admission within normal limits with  the exception of glucose 111.  Repeat chemistries throughout the hospital  stay continued to showed normal values with the exception of mildly elevated  glucoses ranging from 109-129.  Urinalysis on February 04, 2005 showed small  bilirubin, otherwise within normal limits.  Repeat UA on February 11, 2005  was negative with urine culture February 11, 2005 also showing no growth  with final report on February 13, 2005.  On February 12, 2005 the IJ  catheter tip was cultured with final report available on February 15, 2005  showing greater than 100 colonies of Staphylococcus aureus coagulase  negative.  Sensitivity was also available for treatment purposes.  A chest x- ray on February 11, 2005 showed no active cardiopulmonary disease, previous   chest x-ray on February 06, 2006 showed right central venous catheter tip at  the cava arterial junction -- no pneumothorax with left mild lung  subsegmental atelectasis.  EKG on admission -- normal sinus rhythm with  sinus arrhythmia, left axis deviation, left ventricular hypertrophy.  No  previous EKGs available for comparison, confirmed by Dr. Jens Som.   PLAN:  The patient was transferred to the Inpatient Rehabilitation Unit for  continuation of her physical therapy and occupational therapy.  She will  ambulate daily wearing her Aspen brace.  Occupational Therapy will assist  with ADLs as the patient does need to be independent prior to discharge.  Pending at the time of discharge was the culture from the IJ catheter tip as  the patient did continue to have some temperatures at the time of transfer,  although she was stable for inpatient transfer.  Dressing changes will  continue to be daily.  No staples to be removed as the sutures were  subcuticular and Steri-Strips are  over the incision.  The patient will  continue on a regular diet and continue to be monitored for ileus.  The  patient will follow-up with Dr. Otelia [name] following her stay at the rehab  center.  All questions involving her orthopedic care may be addressed by  calling [phone], Dr. Barbaraann Faster office.     Wende Neighbors, P.A.      Kerrin Champagne, M.D.  Electronically Signed   SMV/MEDQ  D:  04/18/2005  T:  04/18/2005  Job:  [id number]

## 2010-08-03 NOTE — Assessment & Plan Note (Signed)
[facility]                            CARDIOLOGY OFFICE NOTE   NAME:[name], [name]                       MRN:          [id number]  DATE:06/25/2006                            DOB:          1933-08-25    PRIMARY CARE PHYSICIAN:  Dr. Loreen [name].   INTERVAL HISTORY:  [name] is a 77 year old woman with a history of  morbid obesity, hypertension, and atypical chest pain, status post  previous negative Myoview with a normal LV function.  Also has a history  of spinal stenosis, status post surgery.   She returns today for interval history.  She continues to have  significant problems with back pain.  Recently, she took a friend's pain  pills, and, apparently, got fairly confused and developed nausea and  vomiting and fell.  She had to be seen in urgent care.  Fortunately, she  did not sustain any serious injury.  She does continue to complain of  chronic shortness of breath which is unchanged.  She has not had any  significant chest pain except for an occasional twinge or two.  She  denies orthopnea, PND, or lower extremity edema.   PHYSICAL EXAMINATION:  She is an elderly woman in no acute distress.  Ambulates around the clinic slowly without any respiratory difficulty.  Blood pressure is 139/80, heart rate is 49, weight is 271 pounds, which  is up 20 pounds from when we saw her last year.  HEENT:  Sclerae anicteric, EOMI.  There are no xanthelasmas.  Mucous  membranes are moist.  Oropharynx clear.  NECK:  Supple.  JVP appears flat, though it is hard to tell clearly  given her size.  Carotids are 2+ bilaterally without any bruits.  There  is no lymphadenopathy or thyromegaly appreciated.  CARDIAC:  She is bradycardic and regular, no murmurs, rubs, or gallops.  LUNGS:  Clear.  ABDOMEN:  Obese, nontender, nondistended.  No obvious  hepatosplenomegaly, no bruits, no masses.  EXTREMITIES:  Warm with no cyanosis, clubbing, or edema.  No rash.  NEURO:  She is alert and oriented x3.  Cranial nerves II-XII are intact.  Moves all 4 extremities without difficulty.  Affect is mildly flattened.   EKG shows sinus bradycardia at a rate of 49 with 1st degree AV block and  LVH.   ASSESSMENT AND PLAN:  1. Chest pain.  This is essentially resolved.  She has had a negative      Myoview.  Continue preventative care.  2. Hypertension.  Blood pressure remains elevated.  Given her      bradycardia, we switched her over from Diltiazem 180 mg to Norvasc      10 mg.  I suspect she will need another agent in the near future,      perhaps spironolactone.  Will keep a close eye on this.  3. Bradycardia.  We are switching her from Diltiazem to Norvasc.  4. Hyperlipidemia.  Will go ahead and check her lipids today, consider      statin therapy.  5. Shortness of breath and fatigue.  I suspect most of  this is related      to her morbid obesity, but she also is at high risk for obstructive      sleep apnea.  I will leave it to Dr. Ernst [name] discretion whether to      consider a sleep study or not.   DISPOSITION:  Return in 4 months for routine followup.     [name] Buckles. Bensimhon, MD  Electronically Signed    DRB/MedQ  DD: 06/25/2006  DT: 06/25/2006  Job #: [id number]   cc:   [name] Perla, DO

## 2010-08-04 ENCOUNTER — Inpatient Hospital Stay (HOSPITAL_COMMUNITY): Payer: Medicare Other

## 2010-08-04 DIAGNOSIS — M79609 Pain in unspecified limb: Secondary | ICD-10-CM

## 2010-08-04 LAB — BASIC METABOLIC PANEL
CO2: 23 mEq/L (ref 19–32)
Chloride: 106 mEq/L (ref 96–112)
GFR calc non Af Amer: 48 mL/min — ABNORMAL LOW (ref >60)
Glucose, Bld: 103 mg/dL — ABNORMAL HIGH (ref 70–99)
Glucose, Bld: 115 mg/dL — ABNORMAL HIGH (ref 70–99)
Potassium: 3.6 mEq/L (ref 3.5–5.1)
Potassium: 3.7 mEq/L (ref 3.5–5.1)
Sodium: 138 mEq/L (ref 135–145)
Sodium: 139 mEq/L (ref 135–145)

## 2010-08-04 LAB — CBC
HCT: 24.7 % — ABNORMAL LOW (ref 36.0–46.0)
Hemoglobin: 8.4 g/dL — ABNORMAL LOW (ref 12.0–15.0)
MCH: 29.3 pg (ref 26.0–34.0)
MCHC: 33.3 g/dL (ref 30.0–36.0)
MCV: 87.8 fL (ref 78.0–100.0)
Platelets: 250 10*3/uL (ref 150–400)
RBC: 2.87 MIL/uL — ABNORMAL LOW (ref 3.87–5.11)
RDW: 14.7 % (ref 11.5–15.5)
WBC: 9.2 10*3/uL (ref 4.0–10.5)

## 2010-08-04 LAB — DIFFERENTIAL
Lymphs Abs: 1.7 10*3/uL (ref 0.7–4.0)
Monocytes Absolute: 2.2 10*3/uL — ABNORMAL HIGH (ref 0.1–1.0)
Monocytes Relative: 22 % — ABNORMAL HIGH (ref 3–12)
Neutro Abs: 5.6 10*3/uL (ref 1.7–7.7)
Neutrophils Relative %: 55 % (ref 43–77)

## 2010-08-05 LAB — URINE CULTURE
Colony Count: NO GROWTH
Culture  Setup Time: 201205180414

## 2010-08-06 LAB — BASIC METABOLIC PANEL
BUN: 13 mg/dL (ref 6–23)
CO2: 24 mEq/L (ref 19–32)
Chloride: 107 mEq/L (ref 96–112)
Creatinine, Ser: 0.99 mg/dL (ref 0.4–1.2)
GFR calc Af Amer: >60 mL/min (ref >60)

## 2010-08-06 LAB — CBC
HCT: 26.5 % — ABNORMAL LOW (ref 36.0–46.0)
MCHC: 33.2 g/dL (ref 30.0–36.0)
MCV: 87.2 fL (ref 78.0–100.0)
RDW: 14.9 % (ref 11.5–15.5)

## 2010-08-06 LAB — DIFFERENTIAL
Eosinophils Relative: 5 % (ref 0–5)
Lymphocytes Relative: 22 % (ref 12–46)
Lymphs Abs: 2.5 10*3/uL (ref 0.7–4.0)
Monocytes Absolute: 1.5 10*3/uL — ABNORMAL HIGH (ref 0.1–1.0)
Monocytes Relative: 13 % — ABNORMAL HIGH (ref 3–12)

## 2010-08-07 LAB — CBC
HCT: 28.7 % — ABNORMAL LOW (ref 36.0–46.0)
Hemoglobin: 9.4 g/dL — ABNORMAL LOW (ref 12.0–15.0)
MCV: 88.6 fL (ref 78.0–100.0)
RDW: 15 % (ref 11.5–15.5)
WBC: 10.9 10*3/uL — ABNORMAL HIGH (ref 4.0–10.5)

## 2010-08-07 LAB — CULTURE, BLOOD (ROUTINE X 2)
Culture  Setup Time: 201205161023
Culture: NO GROWTH
Culture: NO GROWTH

## 2010-08-07 LAB — BASIC METABOLIC PANEL
BUN: 11 mg/dL (ref 6–23)
CO2: 26 mEq/L (ref 19–32)
Chloride: 103 mEq/L (ref 96–112)
GFR calc non Af Amer: >60 mL/min (ref >60)
Glucose, Bld: 95 mg/dL (ref 70–99)
Potassium: 4.1 mEq/L (ref 3.5–5.1)

## 2010-08-07 LAB — DIFFERENTIAL
Eosinophils Relative: 5 % (ref 0–5)
Lymphocytes Relative: 16 % (ref 12–46)
Lymphs Abs: 1.7 10*3/uL (ref 0.7–4.0)
Neutro Abs: 7.1 10*3/uL (ref 1.7–7.7)

## 2010-08-07 LAB — PROTIME-INR: INR: 1.11 (ref 0.00–1.49)

## 2010-08-07 NOTE — Op Note (Signed)
[name], [name]                ACCOUNT NO.:  [id number]  MEDICAL RECORD NO.:  [id number]           PATIENT TYPE:  I  LOCATION:  5033                         FACILITY:  [facility]  PHYSICIAN:  Elana Alm. Thurston Hole, M.D. DATE OF BIRTH:  [birth year]  DATE OF PROCEDURE:  07/30/2010 DATE OF DISCHARGE:                              OPERATIVE REPORT   PREOPERATIVE DIAGNOSIS:  Left knee degenerative joint disease.  POSTOPERATIVE DIAGNOSIS:  Left knee degenerative joint disease.  PROCEDURE:  Left total knee replacement using DePuy cemented total knee prosthesis with #4 narrow cemented femur, #4 MBT revision cemented tibial tray with 15-mm polyethylene RP tibial spacer and 35-mm polyethylene cemented patella.  SURGEON:  Elana Alm. Thurston Hole, MD  ASSISTANT:  Julien Girt, PA-C  ANESTHESIA:  General.  OPERATIVE TIME:  One hour and 40 minutes.  COMPLICATIONS:  None.  DESCRIPTION OF PROCEDURE:  [name] was brought to the operating room on Jul 30, 2010 after a femoral nerve block was placed in the holding by Anesthesia.  She was placed on the operative table in supine position. After being placed under general anesthesia, her left knee was examined. Range of motion from minus 5-125 degrees, mild valgus deformity.  Knee stable ligamentous exam with normal patellar tracking.  She had a Foley catheter placed under sterile conditions.  She received vancomycin 1 gram IV preoperatively for prophylaxis.  Her left leg was then prepped using sterile DuraPrep and draped using sterile technique.  Time-out procedure was called and correct left knee identified.  Latex precautions were carried out as well.  Left leg was then exsanguinated and a thigh tourniquet elevated at 375 mmHg.  Initially through a 15-cm longitudinal incision based over the patella, initial exposure was made. Then subcutaneous tissues were incised along with skin incision.  A median arthrotomy was performed revealing an  excessive amount of normal- appearing joint fluid.  The articular surfaces were inspected.  She had grade 4 changes medially, laterally, and at the patellofemoral joint. Osteophytes were removed from the femoral condyles and tibial plateau. Medial and lateral meniscal remnants were removed as well as the anterior cruciate ligament.  Intramedullary drill was then drilled up the femoral canal for placement of distal femoral cutting jig which was placed in the appropriate manner of rotation and a distal 11-mm cut was made.  The distal femur was incised.  A #4 was found to be the appropriate size.  The #4 cutting jig was placed in the appropriate manner of external rotation, then these cuts were made.  The proximal tibia was then exposed.  The tibial spines were removed with an oscillating saw.  Intramedullary drill was drilled down the tibial canal for placement of proximal tibial cutting jig which was placed in the appropriate manner of rotation and a 4-mm cut was made based off the lateral lower side.  Spacer blocks were then placed in flexion and extension.  15-mm blocks gave excellent balancing, excellent stability, excellent correction of her flexion and valgus deformities.  At this point, the #4 MBT tray trial was placed on the cut tibial surface with an excellent fit and a  keel cut was made.  The PCL box cutter was then placed on the distal femur and these cuts were made.  At this point with the #4 narrow femoral trial and placed in the #4 MBT trial tray in place and a 15-mm polyethylene RP tibial spacer.  Knee was reduced and taken through range of motion from 0-125 degrees with excellent stability and excellent correction of her flexion and varus deformities and slight lateral patellar tracking.  A lateral retinacular release was carried out to improve patellar tracking to normal.  At this point, a resurfacing 8.5 mm cut was made on the patella and 3 locking holes were placed for a  35-mm patellar trial, again patellofemoral tracking was evaluated and found to be normal.  At this point, it was felt that all trial components were of excellent size and stability.  They were then removed.  The knee was then jet lavage irrigated with 3 L of saline. The proximal tibia was then exposed.  A #4 MBT revision tray with tobramycin impregnated cement backing was hammered into position with an excellent fit with excess cement being removed from around the edges. The #4 narrow femoral component with cement backing was hammered in position also with an excellent fit with excess cement being removed from around the edges.  The 15-mm polyethylene RP tibial spacer was placed on tibial baseplate.  The knee reduced, taken through range of motion from 0-125 degrees had excellent stability and excellent correction of her flexion and valgus deformities.  The 35-mm polyethylene cement backed patella was then placed in this position and held there with a clamp.  After the cement hardened, again patellofemoral tracking was evaluated and found to be normal.  At this point, it was felt that all components were of excellent size and stability.  The wound was further irrigated with saline.  Tourniquet was released.  Hemostasis obtained with cautery.  The arthrotomy was closed with #1 Ethibond suture over 2 medium Hemovac drains.  Subcutaneous tissues closed with 0 and 2-0 Vicryl and subcuticular layer closed with 4-0 Monocryl.  Sterile dressings and a long-leg splint applied.  The patient awakened and taken to the recovery room in stable condition. Needle and sponge count was correct x2 at the end of the case. Neurovascular status was normal.  Pulses 2+ and symmetric.     [name], M.D.     RAW/MEDQ  D:  07/30/2010  T:  07/30/2010  Job:  [id number]  Electronically Signed by Salvatore Marvel M.D. on 08/07/2010 05:29:16 PM

## 2010-08-09 LAB — CULTURE, BLOOD (ROUTINE X 2)
Culture  Setup Time: 201205180854
Culture: NO GROWTH

## 2010-08-14 ENCOUNTER — Other Ambulatory Visit (HOSPITAL_COMMUNITY): Payer: Self-pay

## 2010-09-26 NOTE — Discharge Summary (Signed)
[name], BATTA                ACCOUNT NO.:  [id number]  MEDICAL RECORD NO.:  [id number]           PATIENT TYPE:  I  LOCATION:  5033                         FACILITY:  [facility]  PHYSICIAN:  Elana Alm. Thurston Hole, M.D. DATE OF BIRTH:  Sep 06, 1933  DATE OF ADMISSION:  07/30/2010 DATE OF DISCHARGE:  08/07/2010                         DISCHARGE SUMMARY-REFERRING   ADDENDUM:  ADMITTING DIAGNOSES: 1. End-stage degenerative joint disease, left knee. 2. Hypertension. 3. Depression. 4. Chronic pain secondary to degenerative scoliosis. 5. Urinary incontinence. 6. Parkinson disease.  DISCHARGE DIAGNOSES: 1. End-stage degenerative joint disease, left knee, status post left     total knee replacement. 2. Acute blood loss anemia, status post blood transfusion. 3. Hospital-acquired pneumonia. 4. Morbid obesity. 5. Constipation. 6. Hypertension. 7. Depression. 8. Chronic pain secondary to degenerative scoliosis. 9. Urinary incontinence. 10.Parkinson disease.  HISTORY OF PRESENT ILLNESS:  The patient is a 77 year old African American female who has end-stage DJD of both knees.  She had a right total knee replacement in April 2011 and did well following this.  Now she has failed conservative care with her left knee and is here to undergo a left total knee replacement.  Risks, benefits, and possible complications were discussed in detail with the family and they are without question.  PROCEDURES IN HOUSE:  On Jul 30, 2010, the patient underwent a left total knee replacement by Dr. Thurston Hole, a left femoral nerve block by Anesthesia, and Autovac transfusion.  She tolerated all procedures well. She was admitted postoperatively for pain control, DVT prophylaxis, and physical therapy.  On postop day zero in the recovery room, she was placed on CPM at 0 to 90 degrees.  She had a significant amount of postop pain and was unable to tolerate elevation of her heel on the yellow foam pillow but did  tolerate the CPM.  On postop day one, the patient's hemoglobin was 9.4.  Surgical wound was dressed.  She had no excess drainage.  She was unable to ambulate with physical therapy despite 3+ assist.  She was unable to get out of bed.  On postop day one, she developed a fever of 102.2.  Blood cultures were drawn.  UA was drawn.  Medicine was consulted.  She was placed on IV antibiotics of clindamycin and vancomycin with chest x-ray showing left lower lobe pneumonia.  On postop day two, pain was better controlled with p.o. pain medicine. The patient's hemoglobin was 7.8.  She was transfused 2 units of packed red blood cells.  Social work was consulted for skilled nursing placement as she was 2+ assist to ambulate 12 feet.  On postop day three, the patient continued to have fevers.  She tolerated her CPM 0 to 70 degrees.  She was having some constipation issues, but these were resolved with half a bottle of mag citrate.  She ambulated 8 feet but was unable to participate in physical therapy in the second session due to her easy fatigue.  On postop day four, the patient was significantly improved.  She was started on Avelox 400 mg IV.  On postop day five, as she continued to have fevers,  a venous Doppler was done on postop day six that ruled out a DVT.  On postop day seven, the patient continued to have low-grade fevers with a T-max of 100.5. Her IV vancomycin was discontinued, her IV clindamycin was discontinued. Her Avelox was switched from IV to p.o.  On postop day eight, the patient had not had a fever over 100 in 24 hours.  T-max was 99.3.  Temperature on exam was 98.7.  She was still only ambulating 30 feet her less with 1+ assist.  She did not enjoy the CPM but it is medically necessary for her to do the CPM 0 to 90 degrees 6 hours a day.  It can be done in two-hour increments. It is also medically necessary for her to elevate her left heel on either a yellow foam block or a  folded pillow for 30 minutes three times a day to work on extension.  She will need aggressive physical therapy at skilled nursing to work on range of motion as the patient has very little self- motivation, but she is very cooperative when lots of encouragement is used.  Yesterday she refused the CPM.  The day before, she tolerated it 0 to 50 degrees.  Please aggressively work on getting her 0 to 90 on her CPM.  She has not been able to ambulate stairs as of yet.  She is on a regular diet.  She had a bowel movement yesterday which was Aug 06, 2010.  She is being discharged to skilled nursing, weightbearing as tolerated on: 1. Avelox 400 mg, one tablet once a day.  She will need to stop this     August 17, 2010. 2. Colace 100 mg, one tablet twice a day, hold if the patient has a     loose stools. 3. Lovenox 30 mg subcu twice a day.  This can be stopped on Aug 14, 2010. 4. Guaifenesin 600 mg, two tablets twice a day, stop Aug 14, 2010. 5. Norco 5/325, one to two tablets every four to six hours.  The     patient is sensitive and develops acute confusion with over     medication of narcotics, so I would start with one tablet every     four to six hours as needed for pain. 6. Carbidopa levodopa 25/100, one tablet three times a day for     Parkinson. 7. Exforge which is amlodipine 10 mg and valsartan 320 mg combination     pill, one tablet daily for blood pressure control.  Hold if blood     pressure is less than 120/80. 8. Symbyax which is olanzapine/fluoxetine  12/50, one tablet daily for     depression and anxiety.  She will need daily physical therapy, occupational therapy, and nursing CPM 0 to 50 degrees (increase by 10 degrees a day until she is at 90 degrees).  Please do six hours a day of the CPM; if it is broken up into 2, 2, and 2, this is fine.  She will need daily dressing changes.  Her wound is well approximated and healing.  It has very little redness about it, moderate  amount of ecchymosis.  She will need chlorhexidine wipes.  Her whole leg needs to be wiped daily prior to dressing change. She will need to elevate her heel on a yellow foam pillow that is being sent with the patient or on a folded pillow 30 minutes three times a day to work on extension.  Make sure her toes are pointed towards the air and that her hip is not externally rotated when she is stretching.  Please call our office with increased pain, increased redness, increased swelling, or a temperature greater than 101.  She needs to follow up in our office on May 29th for a wound check.  Prescriptions for hydrocodone, Lovenox, and Avelox are being sent with the patient.     [name], PA-C   ______________________________ Elana Alm Thurston Hole, M.D.    KS/MEDQ  D:  08/07/2010  T:  08/07/2010  Job:  [id number]  Electronically Signed by Julien Girt P.A. on 09/16/2010 07:07:40 PM Electronically Signed by Salvatore Marvel M.D. on 09/26/2010 11:25:24 AM

## 2010-09-26 NOTE — Discharge Summary (Signed)
[name], VANDEVENDER                ACCOUNT NO.:  [id number]  MEDICAL RECORD NO.:  [id number]           PATIENT TYPE:  I  LOCATION:  5033                         FACILITY:  [facility]  PHYSICIAN:  Elana Alm. Thurston Hole, M.D. DATE OF BIRTH:  07-29-1933  DATE OF ADMISSION:  07/30/2010 DATE OF DISCHARGE:  08/04/2010                        DISCHARGE SUMMARY - REFERRING   ADMITTING DIAGNOSES: 1. End-stage DJD left knee. 2. Hypertension. 3. Depression. 4. Chronic pain secondary to degenerative scoliosis. 5. Urinary incontinence.  DISCHARGE DIAGNOSES: 1. End-stage DJD left knee status post left total knee replacement. 2. Acute blood loss anemia status post transfusion. 3. Hospital-acquired pneumonia. 4. Morbid obesity. 5. Constipation. 6. Hypertension. 7. Depression. 8. Chronic pain secondary to degenerative scoliosis. 9. Urinary incontinence.  HISTORY OF PRESENT ILLNESS:  The patient is a 77 year old African- American female, who has end-stage DJD of both knees.  She had a right total knee replacement, April 2011, did well following this.  Now has failed conservative care with her left knee and is ready to discuss left total knee replacement.  Risks, benefits, and possible complications were discussed in detail with the family and they are without question.  Procedures inhouse on Jul 30, 2010, the patient underwent a left total knee replacement by Dr. Thurston Hole, a left femoral nerve block by anesthesia, and an Autovac transfusion.  She tolerated all of these procedures well.  HOSPITAL COURSE:  She was admitted postoperatively for pain control, DVT prophylaxis, and physical therapy.  Postop day 0 in the recovery room, she was placed in a CPM 0 to 90 degrees.  She had a significant amount of postop pain control and was unable to tolerate elevation of her heel. Postop day #1, hemoglobin was 9.4.  Surgical wounds dressing was dressed intact.  No excess drainage.  She was unable to ambulate  with physical therapy despite 3+ assist.  She was unable to get out of bed.  Postop day 2 was somewhat improved.  Pain control was controlled by p.o. pain medicine.  She developed a fever of 102.2 on postop day #1 at 6:20. Blood cultures were drawn.  UA was drawn.  Medicine was consulted and she was placed on IV antibiotics of clindamycin and vancomycin and a septic workup was begun.  Chest x-ray did show a left lower lobe pneumonia.  Postop day #2, the patient's hemoglobin was 7.8.  She was transfused 2 units of packed red blood cells.  Social work was consulted for skilled nursing placement as she was 2+ assist and ambulated 12 feet.  Postop day #3, the patient was improved.  She tolerated 0 to 70 on her CPM.  She was having some constipation issues, so she was given hence bottle of magnesium citrate.  She was able to ambulate about 8 feet on postop day #3, unable to participate in physical therapy on the second session.  Postop day #4, the patient was significantly improved. She was down to 1+ assist with ambulation.  Her hemoglobin was stable at 8.9.  She still had a white cell count of 12.3 and still had a T-max of 101.6.  Because of this, she  was not discharged.  IV antibiotics were continued.  She tolerated her CPM 0 to 50 degrees.  Postop day #5, if the patient is afebrile for 24 hours, she will be discharged to skilled nursing.  She will be weightbearing as tolerated on a regular diet.  DISCHARGE MEDICATIONS: 1. Avelox 400 mg 1 tablet once a day to be stopped on Aug 14, 2010 for     her hospital-acquired pneumonia. 2. Colace 100 mg 1 tablet twice a day for constipation, hold for loose     stools. 3. Lovenox 30 mg subcu twice a day.  This can be stopped on Aug 14, 2010. 4. Guaifenesin 600 mg 2 tablets twice a day, stop on Aug 14, 2010. 5. Norco 5/325 one to two tablets every 4-6 hours as needed for pain.     The patient is sensitive to medications, so I would start with 1      tablet every 4-6 hours as needed for pain. 6. Carbidopa and levodopa 25/100 one tablet three times a day for     Parkinson's. 7. Exforge, which is amlodipine 10 mg and valsartan 320 mg 1 tablet     daily for blood pressure control, hold if blood pressure less than     120/80. 8. Symbyax, which is olanzapine/fluoxetine 12/50 one tablet daily for     depression and anxiety.  She also need daily physical therapy, occupational therapy and nursing. She will need a CPM 0 to 50 degrees increased by 10 degrees a day until she is at 90 degrees.  She will need daily dressing changes.  Her wound right now is slightly red with a mild amount of swelling.  She has no drainage with it.  She will need to have chlorhexidine wipes, her whole leg wiped daily prior to just dressing change.  She will need to elevate her left heel on a yellow foam that is being sent with the patient for 30 minutes, three times a day to work on extension.  Please call our office with increased redness, increased swelling, increased pain, or temperature greater than 101.     [name] Shepperson, PA-C   ______________________________ Elana Alm Thurston Hole, M.D.    KS/MEDQ  D:  08/03/2010  T:  08/03/2010  Job:  [id number]  Electronically Signed by Julien Girt P.A. on 09/16/2010 07:07:22 PM Electronically Signed by Salvatore Marvel M.D. on 09/26/2010 11:25:11 AM

## 2010-09-28 ENCOUNTER — Encounter: Payer: Self-pay | Admitting: Family Medicine

## 2010-09-28 ENCOUNTER — Ambulatory Visit (INDEPENDENT_AMBULATORY_CARE_PROVIDER_SITE_OTHER): Payer: Medicare Other | Admitting: Family Medicine

## 2010-09-28 VITALS — BP 140/82 | HR 80 | Temp 99.3°F | Wt 238.4 lb

## 2010-09-28 DIAGNOSIS — L259 Unspecified contact dermatitis, unspecified cause: Secondary | ICD-10-CM

## 2010-09-28 DIAGNOSIS — F3289 Other specified depressive episodes: Secondary | ICD-10-CM

## 2010-09-28 DIAGNOSIS — L309 Dermatitis, unspecified: Secondary | ICD-10-CM

## 2010-09-28 DIAGNOSIS — F32A Depression, unspecified: Secondary | ICD-10-CM

## 2010-09-28 DIAGNOSIS — F329 Major depressive disorder, single episode, unspecified: Secondary | ICD-10-CM

## 2010-09-28 DIAGNOSIS — R21 Rash and other nonspecific skin eruption: Secondary | ICD-10-CM

## 2010-09-28 MED ORDER — OLANZAPINE-FLUOXETINE HCL 6-50 MG PO CAPS: 1.0000 | ORAL_CAPSULE | Freq: Every evening | ORAL | Status: DC

## 2010-09-28 MED ORDER — DESOXIMETASONE 0.05 % EX CREA: TOPICAL_CREAM | Freq: Two times a day (BID) | CUTANEOUS | Status: DC

## 2010-09-28 MED ORDER — OLANZAPINE-FLUOXETINE HCL 12-50 MG PO CAPS: 1.0000 | ORAL_CAPSULE | Freq: Every evening | ORAL | Status: DC

## 2010-09-28 NOTE — Patient Instructions (Signed)
Stop celexa  Start symbyax 6/50 1 a day for 1 week then change to 12/50  Daily----if you have any increase in tremors or nausea does not resolve---stop med and call our office

## 2010-09-28 NOTE — Assessment & Plan Note (Signed)
Refill topicort

## 2010-09-28 NOTE — Assessment & Plan Note (Addendum)
D/w with Dr Dan Humphreys 's nurse and it was decided to put her back on Symbyax but if nausea does not resolve or tremors worsen she needs to stop it and call our office. rto 1 month Pt here 30 min-->50% face to face

## 2010-09-28 NOTE — Progress Notes (Signed)
  Subjective:    Patient ID: [name], female    DOB: May 09, 1933, 77 y.o.   MRN: [id number]  HPI  Pt is here with her family c/o nausea since stopping symbyax---they would like her to go back on it but neurologist took her off of it because he felt it was making her parkinsons worse.  Pt feels she is worse off of it and family agrees.    Review of Systems As above    Objective:   Physical Exam  Constitutional: She appears well-developed and well-nourished.  Pulmonary/Chest: Effort normal and breath sounds normal. No respiratory distress. She has no wheezes. She has no rales. She exhibits no tenderness.  Abdominal: Soft. Bowel sounds are normal. There is no tenderness.  Psychiatric: Thought content normal. Her mood appears not anxious. Her affect is blunt. Her affect is not inappropriate. Her speech is delayed. She is slowed. She is not agitated, not aggressive, is not hyperactive and not combative. She exhibits a depressed mood.          Assessment & Plan:

## 2010-12-17 LAB — URINALYSIS, ROUTINE W REFLEX MICROSCOPIC
Bilirubin Urine: NEGATIVE
Glucose, UA: NEGATIVE
Hgb urine dipstick: NEGATIVE
Ketones, ur: 15 — AB
Specific Gravity, Urine: 1.02
pH: 8

## 2010-12-17 LAB — URINE CULTURE: Colony Count: 15000

## 2010-12-17 LAB — POCT I-STAT, CHEM 8
HCT: 34 — ABNORMAL LOW
Hemoglobin: 11.6 — ABNORMAL LOW
Potassium: 3.5
Sodium: 139
TCO2: 25

## 2010-12-19 LAB — CBC
Hemoglobin: 10.9 — ABNORMAL LOW
MCHC: 33
RBC: 3.69 — ABNORMAL LOW
WBC: 6.4

## 2010-12-19 LAB — COMPREHENSIVE METABOLIC PANEL
ALT: 17
AST: 17
Alkaline Phosphatase: 69
CO2: 25
Chloride: 108
GFR calc Af Amer: >60
GFR calc non Af Amer: 52 — ABNORMAL LOW
Glucose, Bld: 112 — ABNORMAL HIGH
Sodium: 140
Total Bilirubin: 0.8

## 2010-12-19 LAB — URINE CULTURE

## 2010-12-19 LAB — URINE MICROSCOPIC-ADD ON

## 2010-12-19 LAB — URINALYSIS, ROUTINE W REFLEX MICROSCOPIC
Hgb urine dipstick: NEGATIVE
Nitrite: NEGATIVE
Protein, ur: NEGATIVE
Specific Gravity, Urine: 1.021
Urobilinogen, UA: 1

## 2010-12-19 LAB — DIFFERENTIAL
Basophils Absolute: 0
Basophils Relative: 0
Eosinophils Absolute: 0.1
Eosinophils Relative: 1
Neutrophils Relative %: 72

## 2010-12-27 LAB — CBC
HCT: 34.6 — ABNORMAL LOW
HCT: 35.8 — ABNORMAL LOW
Hemoglobin: 12
MCHC: 33.6
MCV: 91.1
Platelets: 316
RDW: 12.9
RDW: 13.3

## 2010-12-27 LAB — ETHANOL: Alcohol, Ethyl (B): <5

## 2010-12-27 LAB — COMPREHENSIVE METABOLIC PANEL
ALT: 16
AST: 18
BUN: 15
CO2: 24
CO2: 25
Calcium: 9.2
Calcium: 10.3
Chloride: 101
Creatinine, Ser: 1.5 — ABNORMAL HIGH
Creatinine, Ser: 1.1
GFR calc non Af Amer: 34 — ABNORMAL LOW
GFR calc non Af Amer: 49 — ABNORMAL LOW
Glucose, Bld: 114 — ABNORMAL HIGH
Glucose, Bld: 106 — ABNORMAL HIGH
Sodium: 140
Total Bilirubin: 0.6
Total Protein: 7

## 2010-12-27 LAB — URINALYSIS, ROUTINE W REFLEX MICROSCOPIC
Bilirubin Urine: NEGATIVE
Glucose, UA: NEGATIVE
Hgb urine dipstick: NEGATIVE
Hgb urine dipstick: NEGATIVE
Ketones, ur: NEGATIVE
Ketones, ur: 15 — AB
Nitrite: NEGATIVE
Nitrite: NEGATIVE
Protein, ur: NEGATIVE
Specific Gravity, Urine: 1.023
Urobilinogen, UA: 0.2
Urobilinogen, UA: 0.2
Urobilinogen, UA: 1
pH: 6.5

## 2010-12-27 LAB — RAPID URINE DRUG SCREEN, HOSP PERFORMED
Benzodiazepines: POSITIVE — AB
Cocaine: NOT DETECTED
Tetrahydrocannabinol: NOT DETECTED

## 2010-12-27 LAB — T3, FREE: T3, Free: 3.1 (ref 2.3–4.2)

## 2010-12-27 LAB — I-STAT 8, (EC8 V) (CONVERTED LAB)
Acid-Base Excess: 6 — ABNORMAL HIGH
Chloride: 104
HCT: 37
Operator id: 151321
TCO2: 31
pCO2, Ven: 38 — ABNORMAL LOW

## 2010-12-27 LAB — DIFFERENTIAL
Basophils Absolute: 0
Basophils Relative: 1
Eosinophils Relative: 4
Lymphocytes Relative: 38
Lymphs Abs: 3.1
Monocytes Relative: 12 — ABNORMAL HIGH
Neutro Abs: 3.1
Neutro Abs: 3.8
Neutrophils Relative %: 47

## 2010-12-27 LAB — POCT I-STAT CREATININE: Operator id: 151321

## 2010-12-27 LAB — BASIC METABOLIC PANEL
BUN: 20
CO2: 28
Calcium: 9.1
Chloride: 106
Creatinine, Ser: 1.19
GFR calc Af Amer: 54 — ABNORMAL LOW

## 2010-12-27 LAB — TSH: TSH: 0.471

## 2010-12-27 LAB — URINE MICROSCOPIC-ADD ON

## 2010-12-27 LAB — URINE CULTURE: Culture: NO GROWTH

## 2010-12-27 LAB — LIPASE, BLOOD: Lipase: 16

## 2010-12-31 ENCOUNTER — Other Ambulatory Visit: Payer: Self-pay | Admitting: Family Medicine

## 2011-01-02 ENCOUNTER — Other Ambulatory Visit: Payer: Self-pay | Admitting: Family Medicine

## 2011-02-26 DIAGNOSIS — G20A1 Parkinson's disease without dyskinesia, without mention of fluctuations: Secondary | ICD-10-CM | POA: Insufficient documentation

## 2011-02-26 DIAGNOSIS — G20 Parkinson's disease: Secondary | ICD-10-CM | POA: Insufficient documentation

## 2011-04-02 ENCOUNTER — Other Ambulatory Visit: Payer: Self-pay | Admitting: Family Medicine

## 2011-04-02 NOTE — Telephone Encounter (Signed)
rx sent to pharmacy by e-script  

## 2011-06-12 ENCOUNTER — Encounter: Payer: Self-pay | Admitting: Family Medicine

## 2011-06-12 ENCOUNTER — Ambulatory Visit (INDEPENDENT_AMBULATORY_CARE_PROVIDER_SITE_OTHER): Payer: Medicare Other | Admitting: Family Medicine

## 2011-06-12 VITALS — BP 137/80 | HR 80 | Temp 98.6°F | Ht 64.0 in | Wt 277.0 lb

## 2011-06-12 DIAGNOSIS — M545 Low back pain, unspecified: Secondary | ICD-10-CM | POA: Insufficient documentation

## 2011-06-12 MED ORDER — MELOXICAM 7.5 MG PO TABS: 7.5000 mg | ORAL_TABLET | Freq: Every day | ORAL | Status: DC

## 2011-06-12 NOTE — Assessment & Plan Note (Signed)
New.  Pt's sxs consistent w/ paraspinal spasm.  Most likely due to pt's use of cane on R side.  No red flags on hx or PE.  Start low dose NSAID and heating pad.  No muscle relaxer due to increased fall risk.  Reviewed supportive care and red flags that should prompt return.  Pt and daughter expressed understanding and are in agreement w/ plan.

## 2011-06-12 NOTE — Patient Instructions (Signed)
This appears to be muscular in nature Take the Mobic once daily- w/ food Continue the tylenol as needed HEATING PAD! If your symptoms continue after 7-10 days, please call and we'll send you to ortho Call with any questions or concerns Hang in there!!

## 2011-06-12 NOTE — Progress Notes (Signed)
  Subjective:    Patient ID: [name], female    DOB: [birth year], 78 y.o.   MRN: [id number]  HPI Back pain- R sided, low back.  sxs present 'for at least a month'.  Worsening.  Waking her from sleep.  No radiation of pain.  Pain is constant.  Will somewhat improve w/ lying on it.  Not worse w/ movement.  Tylenol will ease pain but not relieve it.  No hx of similar.  Can not recall any new or different activity prior to onset.  No known injury.  Ambulates w/ cane due to bilateral knee replacement and parkinson's- leans forward and uses cane in R hand   Review of Systems For ROS see HPI     Objective:   Physical Exam  Vitals reviewed. Constitutional: She appears well-developed and well-nourished. No distress.       obese  Musculoskeletal: She exhibits tenderness (R paraspinal tenderness to palpation.  no TTP over spine.). She exhibits no edema.       No pain w/ flexion, + discomfort w/ extension (-) SLR bilaterally          Assessment & Plan:

## 2011-06-20 ENCOUNTER — Telehealth: Payer: Self-pay | Admitting: *Deleted

## 2011-06-20 DIAGNOSIS — M549 Dorsalgia, unspecified: Secondary | ICD-10-CM

## 2011-06-20 NOTE — Telephone Encounter (Signed)
Pt called stating that she wanted to let you know she is still not feeling any better. Please advise.

## 2011-06-20 NOTE — Telephone Encounter (Signed)
If not feeling better will need ortho referral (dx back pain)

## 2011-06-21 NOTE — Telephone Encounter (Signed)
Called pt to advise that MD Beverely Low advises that she will send her to an orthopedic concerning her back, pt accepted referral advice, aware someone will call her about upcoming apt, pt understood, referral placed in chart

## 2011-06-30 ENCOUNTER — Other Ambulatory Visit: Payer: Self-pay | Admitting: Family Medicine

## 2011-07-24 ENCOUNTER — Encounter: Payer: Self-pay | Admitting: Family Medicine

## 2011-07-24 ENCOUNTER — Ambulatory Visit (INDEPENDENT_AMBULATORY_CARE_PROVIDER_SITE_OTHER): Payer: Medicare Other | Admitting: Family Medicine

## 2011-07-24 VITALS — BP 128/78 | HR 85 | Temp 98.3°F | Ht 64.0 in | Wt 276.6 lb

## 2011-07-24 DIAGNOSIS — N390 Urinary tract infection, site not specified: Secondary | ICD-10-CM

## 2011-07-24 DIAGNOSIS — M545 Low back pain: Secondary | ICD-10-CM

## 2011-07-24 LAB — POCT URINALYSIS DIPSTICK
Bilirubin, UA: NEGATIVE
Ketones, UA: NEGATIVE

## 2011-07-24 MED ORDER — SULFAMETHOXAZOLE-TRIMETHOPRIM 800-160 MG PO TABS: 1.0000 | ORAL_TABLET | Freq: Two times a day (BID) | ORAL | Status: AC

## 2011-07-24 NOTE — Progress Notes (Signed)
  Subjective:    Patient ID: [name], female    DOB: 06-09-1933, 78 y.o.   MRN: [id number]  HPI Pain in my side- pain is R sided, moving outward from spine and radiating down.  [facility]- 'they did [facility]'.  Had previously seen Dr Farris Has at [facility].  Not sleeping at night due to pain.  Will find some comfort w/ lying on that side and putting pressure on it.  S[facility] Dr Retia Passe [facility].  Got a shot yesterday, was switched to Percocet w/ mild relief.  Has f/u on 5/31.  They want to know what else can be done.  No changes to bowel or bladder habits.    Review of Systems For ROS see HPI     Objective:   Physical Exam  Vitals reviewed. Constitutional: She is oriented to person, place, and time. She appears well-developed and well-nourished.       Obviously uncomfortable  Abdominal: Soft. Bowel sounds are normal. She exhibits no distension. There is tenderness (CVA and suprapubic tenderness).  Musculoskeletal: She exhibits edema (trace- +1 bilaterally).  Neurological: She is alert and oriented to person, place, and time. No cranial nerve deficit.          Assessment & Plan:

## 2011-07-24 NOTE — Patient Instructions (Signed)
Increase your water intake Start the Bactrim twice daily for infection Continue the Percocet as directed by Dr Retia Passe Call with any questions or concerns Sherri Rad in there!

## 2011-07-27 LAB — CULTURE, URINE COMPREHENSIVE

## 2011-07-30 ENCOUNTER — Ambulatory Visit: Payer: Medicare Other | Admitting: Family Medicine

## 2011-08-02 ENCOUNTER — Ambulatory Visit (INDEPENDENT_AMBULATORY_CARE_PROVIDER_SITE_OTHER): Payer: Medicare Other | Admitting: Family Medicine

## 2011-08-02 ENCOUNTER — Encounter: Payer: Self-pay | Admitting: Family Medicine

## 2011-08-02 VITALS — BP 124/84 | HR 90 | Temp 98.2°F | Wt 267.4 lb

## 2011-08-02 DIAGNOSIS — M549 Dorsalgia, unspecified: Secondary | ICD-10-CM

## 2011-08-02 DIAGNOSIS — R109 Unspecified abdominal pain: Secondary | ICD-10-CM

## 2011-08-02 LAB — POCT URINALYSIS DIPSTICK
Leukocytes, UA: NEGATIVE
Nitrite, UA: NEGATIVE
Protein, UA: NEGATIVE
pH, UA: 6

## 2011-08-02 MED ORDER — LIDOCAINE 5 % EX PTCH: 1.0000 | MEDICATED_PATCH | CUTANEOUS | Status: AC

## 2011-08-02 MED ORDER — METAXALONE 400 MG HALF TABLET: 800.0000 mg | ORAL_TABLET | Freq: Three times a day (TID) | ORAL | Status: AC

## 2011-08-02 NOTE — Patient Instructions (Signed)
Back Pain, Adult Low back pain is very common. About 1 in 5 people have back pain.The cause of low back pain is rarely dangerous. The pain often gets better over time.About half of people with a sudden onset of back pain feel better in just 2 weeks. About 8 in 10 people feel better by 6 weeks.  CAUSES Some common causes of back pain include:  Strain of the muscles or ligaments supporting the spine.   Wear and tear (degeneration) of the spinal discs.   Arthritis.   Direct injury to the back.  DIAGNOSIS Most of the time, the direct cause of low back pain is not known.However, back pain can be treated effectively even when the exact cause of the pain is unknown.Answering your caregiver's questions about your overall health and symptoms is one of the most accurate ways to make sure the cause of your pain is not dangerous. If your caregiver needs more information, he or she may order lab work or imaging tests (X-rays or MRIs).However, even if imaging tests show changes in your back, this usually does not require surgery. HOME CARE INSTRUCTIONS For many people, back pain returns.Since low back pain is rarely dangerous, it is often a condition that people can learn to manageon their own.   Remain active. It is stressful on the back to sit or stand in one place. Do not sit, drive, or stand in one place for more than 30 minutes at a time. Take short walks on level surfaces as soon as pain allows.Try to increase the length of time you walk each day.   Do not stay in bed.Resting more than 1 or 2 days can delay your recovery.   Do not avoid exercise or work.Your body is made to move.It is not dangerous to be active, even though your back may hurt.Your back will likely heal faster if you return to being active before your pain is gone.   Pay attention to your body when you bend and lift. Many people have less discomfortwhen lifting if they bend their knees, keep the load close to their  bodies,and avoid twisting. Often, the most comfortable positions are those that put less stress on your recovering back.   Find a comfortable position to sleep. Use a firm mattress and lie on your side with your knees slightly bent. If you lie on your back, put a pillow under your knees.   Only take over-the-counter or prescription medicines as directed by your caregiver. Over-the-counter medicines to reduce pain and inflammation are often the most helpful.Your caregiver may prescribe muscle relaxant drugs.These medicines help dull your pain so you can more quickly return to your normal activities and healthy exercise.   Put ice on the injured area.   Put ice in a plastic bag.   Place a towel between your skin and the bag.   Leave the ice on for 15 to 20 minutes, 3 to 4 times a day for the first 2 to 3 days. After that, ice and heat may be alternated to reduce pain and spasms.   Ask your caregiver about trying back exercises and gentle massage. This may be of some benefit.   Avoid feeling anxious or stressed.Stress increases muscle tension and can worsen back pain.It is important to recognize when you are anxious or stressed and learn ways to manage it.Exercise is a great option.  SEEK MEDICAL CARE IF:  You have pain that is not relieved with rest or medicine.   You have   pain that does not improve in 1 week.   You have new symptoms.   You are generally not feeling well.  SEEK IMMEDIATE MEDICAL CARE IF:   You have pain that radiates from your back into your legs.   You develop new bowel or bladder control problems.   You have unusual weakness or numbness in your arms or legs.   You develop nausea or vomiting.   You develop abdominal pain.   You feel faint.  Document Released: 03/04/2005 Document Revised: 02/21/2011 Document Reviewed: 07/23/2010 [employer] Patient Information 2012 [employer]. 

## 2011-08-03 NOTE — Assessment & Plan Note (Signed)
New.  Start abx.  Increase fluid intake.

## 2011-08-03 NOTE — Assessment & Plan Note (Signed)
Deteriorated- likely due to superimposed UTI.  Started seeing spine specialist and had meds switched yesterday after receiving injxn.  Will defer musculoskeletal management to them but will tx UTI.  Pt expressed understanding and is in agreement w/ plan.

## 2011-08-04 ENCOUNTER — Encounter: Payer: Self-pay | Admitting: Family Medicine

## 2011-08-04 NOTE — Progress Notes (Signed)
  Subjective:    Patient ID: [name], female    DOB: July 07, 1933, 78 y.o.   MRN: [id number]  HPI Pt is here with family member c/o increasing back pain.  Pt sees ortho and pain management and recently had an injection with no relief.  Daughter concerned that it could be a kidney stone.  Pt states pain is in R flank and has moved to front.  UA neg.  No other complaints.    Review of Systems As above    Objective:   Physical Exam  Constitutional: She is oriented to person, place, and time. She appears well-developed and well-nourished.  Abdominal: Soft. There is no tenderness. There is no rebound and no guarding.  Musculoskeletal:       Pain with palp R flank   Neurological: She is alert and oriented to person, place, and time.  Skin: Skin is warm and dry. No rash noted. No erythema.  Psychiatric: She has a normal mood and affect. Her behavior is normal. Judgment and thought content normal.          Assessment & Plan:  Back pain--- con't pain meds,  Skelaxin                  UA neg                  Pt and family requesting [place] to look for kidney stone                   Strain urine                   CT urogram

## 2011-08-05 ENCOUNTER — Ambulatory Visit (HOSPITAL_BASED_OUTPATIENT_CLINIC_OR_DEPARTMENT_OTHER)
Admission: RE | Admit: 2011-08-05 | Discharge: 2011-08-05 | Disposition: A | Discharge status: Home / Self Care | Payer: Medicare Other | Source: Ambulatory Visit | Attending: Family Medicine | Admitting: Family Medicine

## 2011-08-05 DIAGNOSIS — R109 Unspecified abdominal pain: Secondary | ICD-10-CM | POA: Insufficient documentation

## 2011-08-05 DIAGNOSIS — N200 Calculus of kidney: Secondary | ICD-10-CM | POA: Insufficient documentation

## 2011-08-06 ENCOUNTER — Other Ambulatory Visit: Payer: Self-pay | Admitting: *Deleted

## 2011-08-06 NOTE — Telephone Encounter (Signed)
error 

## 2011-09-27 ENCOUNTER — Ambulatory Visit (INDEPENDENT_AMBULATORY_CARE_PROVIDER_SITE_OTHER): Payer: Medicare Other | Admitting: Family Medicine

## 2011-09-27 ENCOUNTER — Encounter: Payer: Self-pay | Admitting: Family Medicine

## 2011-09-27 VITALS — BP 140/78 | HR 75 | Temp 98.2°F | Wt 274.2 lb

## 2011-09-27 DIAGNOSIS — N281 Cyst of kidney, acquired: Secondary | ICD-10-CM

## 2011-09-27 DIAGNOSIS — N200 Calculus of kidney: Secondary | ICD-10-CM

## 2011-09-27 DIAGNOSIS — M545 Low back pain: Secondary | ICD-10-CM

## 2011-09-27 DIAGNOSIS — Q619 Cystic kidney disease, unspecified: Secondary | ICD-10-CM

## 2011-09-27 DIAGNOSIS — E785 Hyperlipidemia, unspecified: Secondary | ICD-10-CM

## 2011-09-27 DIAGNOSIS — Z87440 Personal history of urinary (tract) infections: Secondary | ICD-10-CM

## 2011-09-27 DIAGNOSIS — I10 Essential (primary) hypertension: Secondary | ICD-10-CM

## 2011-09-27 DIAGNOSIS — N390 Urinary tract infection, site not specified: Secondary | ICD-10-CM

## 2011-09-27 LAB — POCT URINALYSIS DIPSTICK
Blood, UA: NEGATIVE
Leukocytes, UA: NEGATIVE
Nitrite, UA: NEGATIVE
Urobilinogen, UA: 0.2
pH, UA: 6

## 2011-09-27 LAB — CBC WITH DIFFERENTIAL/PLATELET
Basophils Absolute: 0 10*3/uL (ref 0.0–0.1)
Eosinophils Relative: 3.4 % (ref 0.0–5.0)
HCT: 37.3 % (ref 36.0–46.0)
Lymphs Abs: 1.6 10*3/uL (ref 0.7–4.0)
Monocytes Absolute: 0.6 10*3/uL (ref 0.1–1.0)
Monocytes Relative: 10.9 % (ref 3.0–12.0)
Neutrophils Relative %: 55.5 % (ref 43.0–77.0)
Platelets: 311 10*3/uL (ref 150.0–400.0)
RDW: 14.5 % (ref 11.5–14.6)
WBC: 5.4 10*3/uL (ref 4.5–10.5)

## 2011-09-27 LAB — LIPID PANEL
Cholesterol: 231 mg/dL — ABNORMAL HIGH (ref 0–200)
Total CHOL/HDL Ratio: 4
Triglycerides: 95 mg/dL (ref 0.0–149.0)
VLDL: 19 mg/dL (ref 0.0–40.0)

## 2011-09-27 LAB — BASIC METABOLIC PANEL
BUN: 19 mg/dL (ref 6–23)
Creatinine, Ser: 1 mg/dL (ref 0.4–1.2)
GFR: 71.04 mL/min (ref >60.00)
Glucose, Bld: 96 mg/dL (ref 70–99)

## 2011-09-27 LAB — HEPATIC FUNCTION PANEL
ALT: 4 U/L (ref 0–35)
Bilirubin, Direct: 0 mg/dL (ref 0.0–0.3)
Total Bilirubin: 0.4 mg/dL (ref 0.3–1.2)

## 2011-09-27 NOTE — Progress Notes (Signed)
  Subjective:    Patient ID: [name], female    DOB: Feb 12, 1934, 78 y.o.   MRN: [id number]  HPI Pt here to f/u back pain which is worse.   Pt and her daughter went to urology on there own and were told the tiny stone was in no way related to her pain.  She has been through a series of shots with ortho and has a f/u next week.  She states the shots worked for a little while but then pain comes right back.   Pt daughter is concerned about her kidneys and bladder and abd and wonders if there could be something there but we went over the CT again and there was nothing in abd /pelvis that could cause the pain.  Pain is still in R low back and does radiated sometimes to R thigh.  Pt is having more difficulty walking.   Review of Systems As above    Objective:   Physical Exam  Constitutional: She is oriented to person, place, and time. She appears well-developed and well-nourished.  Musculoskeletal:       Pain with any movement in R low back and weakness in r leg.  Neurological: She is alert and oriented to person, place, and time.  Psychiatric: She has a normal mood and affect. Her behavior is normal. Judgment and thought content normal.          Assessment & Plan:

## 2011-09-27 NOTE — Assessment & Plan Note (Signed)
F/u ortho for "burning of the nerve"  Per pt and daughters

## 2011-09-27 NOTE — Assessment & Plan Note (Signed)
con't meds stable 

## 2011-09-27 NOTE — Assessment & Plan Note (Signed)
Recheck urine today  

## 2011-10-02 ENCOUNTER — Other Ambulatory Visit: Payer: Self-pay | Admitting: Family Medicine

## 2011-10-04 ENCOUNTER — Other Ambulatory Visit: Payer: Self-pay | Admitting: Family Medicine

## 2011-10-31 ENCOUNTER — Telehealth: Payer: Self-pay | Admitting: Internal Medicine

## 2011-10-31 NOTE — Telephone Encounter (Signed)
Patient has remote GI history 15 years ago.  She can't remember who it was with.  She is having abdominal pain and is requesting an appt.  She will come in and see Dr. Jarold Motto on 11/19/11.  I have mailed her new patient paperwork

## 2011-11-19 ENCOUNTER — Encounter: Payer: Self-pay | Admitting: Gastroenterology

## 2011-11-19 ENCOUNTER — Ambulatory Visit (INDEPENDENT_AMBULATORY_CARE_PROVIDER_SITE_OTHER): Payer: Medicare Other | Admitting: Gastroenterology

## 2011-11-19 VITALS — BP 128/72 | HR 76 | Ht 64.0 in | Wt 279.8 lb

## 2011-11-19 DIAGNOSIS — M549 Dorsalgia, unspecified: Secondary | ICD-10-CM

## 2011-11-19 DIAGNOSIS — R109 Unspecified abdominal pain: Secondary | ICD-10-CM

## 2011-11-19 DIAGNOSIS — G8929 Other chronic pain: Secondary | ICD-10-CM

## 2011-11-19 DIAGNOSIS — K5730 Diverticulosis of large intestine without perforation or abscess without bleeding: Secondary | ICD-10-CM

## 2011-11-19 MED ORDER — HYOSCYAMINE SULFATE 0.125 MG SL SUBL: 0.1250 mg | SUBLINGUAL_TABLET | SUBLINGUAL | Status: DC | PRN

## 2011-11-19 NOTE — Progress Notes (Signed)
History of Present Illness:  This is a very complicated 79 year old African American female with morbid obesity who has chronic scoliosis and chronic back pain referred for evaluation of right sided abdominal pain which allegedly has been present for the last 6 months. This pain apparently occurs several times a day and is described as a sharp radiating pain from her back with associated nausea and occasional emesis. There no real precipitating or alleviating elements otherwise. The patient has had numerous evaluations which are been unremarkable including CT scan may of this past year which showed small kidney stones, diverticulosis, and degenerative disease of the lumbar spine with previous surgery. Urologic evaluation otherwise is been unremarkable. Patient denies chronic dyspepsia, reflux symptoms, or any specific hepatobiliary or lower gastrointestinal complaints. Her stools have been guaiac negative. The patient and her daughter relates she has daily bowel movements without melena or hematochezia.. She has chronic atypical chest pain and is followed by cardiology, and has benign essential hypertension without evidence of coronary artery disease. She denies abuse of NSAIDs, alcohol or cigarettes. Despite all these complaints is been no anorexia or weight loss. She is status post hysterectomy. Problems include chronic anxiety and depression, essential tremor, and chronic fatigue with deconditioning.   I have reviewed this patient's present history, medical and surgical past history, allergies and medications.     ROS: The remainder of the 10 point ROS is negative... chronic low back pain, chronic anxiety, diffuse myalgias with occasional urinary incontinency. She denies any symptoms of congestive heart failure at this time. There also is no history of chart review and lab review of peripheral vascular disease. She's had several CT scans that were reviewed with questionable cholesterol gallstones in 2001.  I cannot see any history of hepatitis, pancreatitis, or diverticulitis.     Physical Exam: Morbidly obese patient in no acute distress. She is with her daughter throughout this exam and interview. Blood pressure 120/72, pulse 76, and weight 279 pounds with a BMI of 48.03. General well developed well nourished patient in no acute distress, appearing their stated age Eyes PERRLA, no icterus, fundoscopic exam per opthamologist Skin no lesions noted Neck supple, no adenopathy, no thyroid enlargement, no tenderness Chest clear to percussion and auscultation Heart no significant murmurs, gallops or rubs noted Abdomen no hepatosplenomegaly masses or tenderness, BS normal.  Extremities no acute joint lesions, edema, phlebitis or evidence of cellulitis. Neurologic patient oriented x 3, cranial nerves intact, no focal neurologic deficits noted. Psychological mental status normal and normal affect.  Assessment and plan: This patient has pain from her low back area radiating in a radicular manner into her abdomen, and I suspect all of her pain is musculoskeletal with neurogenic irritation. I have set her up for ultrasound the gallbladder, and she may need CCK-HIDA scan to be complete. Review of her labs shows no evidence of abnormal liver function test, enlarged liver, or vascular disease of her intestines. Apparently this patient has a history of chronic pain syndrome and previous narcotic use, and I doubt we will uncover any new diagnosis. If this workup is negative I would suggest referral to a pain clinic for multilevel treatment of her pain. She is a poor candidate for endoscopic exams, and really otherwise has no evidence of anemia, blood in her stools, or GI symptoms. I have given her some sublingual Levsin to use on a when necessary basis until her gallbladder workup is complete  Encounter Diagnosis  Name Primary?  . Abdominal pain Yes

## 2011-11-19 NOTE — Patient Instructions (Addendum)
You have been scheduled for an abdominal ultrasound at [facility] (1st floor of hospital) on 11-20-11 at 11 am. Please arrive 15 minutes prior to your appointment for registration. Make certain not to have anything to eat or drink 6 hours prior to your appointment. Should you need to reschedule your appointment, please contact radiology at [phone]. We have sent the following medications to your pharmacy for you to pick up at your convenience: generic Levsin  CC: Loreen Freud, M.D.

## 2011-11-20 ENCOUNTER — Other Ambulatory Visit: Payer: Self-pay | Admitting: Gastroenterology

## 2011-11-20 ENCOUNTER — Ambulatory Visit (HOSPITAL_COMMUNITY)
Admission: RE | Admit: 2011-11-20 | Discharge: 2011-11-20 | Disposition: A | Discharge status: Home / Self Care | Payer: Medicare Other | Source: Ambulatory Visit | Attending: Gastroenterology | Admitting: Gastroenterology

## 2011-11-20 DIAGNOSIS — R109 Unspecified abdominal pain: Secondary | ICD-10-CM

## 2011-11-22 ENCOUNTER — Telehealth: Payer: Self-pay | Admitting: Gastroenterology

## 2011-11-22 NOTE — Telephone Encounter (Signed)
Needs cck-hida scan...copy i care.Marland KitchenMarland Kitchenprobably needs PAIN CLINIC REFERRAL   Informed pt of her u/s results and Dr Norval Gable orders for a CCK HIDA scan. Explained what the scan is for and gave her her appt time, NPO status and location; pt stated understanding.

## 2011-11-25 ENCOUNTER — Encounter (HOSPITAL_COMMUNITY)
Admission: RE | Admit: 2011-11-25 | Discharge: 2011-11-25 | Disposition: A | Discharge status: Home / Self Care | Payer: Medicare Other | Source: Ambulatory Visit | Attending: Gastroenterology | Admitting: Gastroenterology

## 2011-11-25 DIAGNOSIS — R109 Unspecified abdominal pain: Secondary | ICD-10-CM | POA: Insufficient documentation

## 2011-11-25 MED ORDER — TECHNETIUM TC 99M MEBROFENIN IV KIT
5.5000 | PACK | Freq: Once | INTRAVENOUS | Status: AC | PRN
  Administered 2011-11-25: 6 via INTRAVENOUS

## 2011-11-27 ENCOUNTER — Telehealth: Payer: Self-pay | Admitting: Gastroenterology

## 2011-11-27 ENCOUNTER — Other Ambulatory Visit: Payer: Self-pay | Admitting: Family Medicine

## 2011-11-27 DIAGNOSIS — R109 Unspecified abdominal pain: Secondary | ICD-10-CM

## 2011-11-28 NOTE — Telephone Encounter (Signed)
Patient scheduled to see Dr. Chevis Pretty on 12/10/11 @ 9:10am, arrive @ 8:40. If you have any questions please call [phone].  Informed pt of appt and mailed her a map for [facility].

## 2011-11-28 NOTE — Telephone Encounter (Signed)
Notes Recorded by Mardella Layman, MD on 11/25/2011 at 4:38 PM She has abnormal CCK HIDA scan. She is a poor surgical risk, but I do think she deserves a surgical opinion. Please schedule her with [facility], and copy her workup and my notes to them please. Informed pt of Dr Norval Gable recommendations. She stated understanding and would like to speak with a surgeon; she has no preference.

## 2011-11-29 ENCOUNTER — Telehealth: Payer: Self-pay | Admitting: Gastroenterology

## 2011-11-29 NOTE — Telephone Encounter (Signed)
Dr Jarold Motto, can you please explain what is wrong with gall bladder so I can inform the pt? Usually the radiologist will list the norms and I can give a little info, but I can't with this HIDA CCK. Also, pt wants to know why she is a poor surgical risk; is it more than her weight? Thanks.

## 2011-11-29 NOTE — Telephone Encounter (Signed)
lmom for daughter to call back

## 2011-12-02 NOTE — Telephone Encounter (Signed)
Sorry. Is her weight the only reason she is a poor surgical candidate? Thanks.

## 2011-12-02 NOTE — Telephone Encounter (Signed)
Morbid obesity and chronic back pain.Marland Kitchen

## 2011-12-02 NOTE — Telephone Encounter (Signed)
Low gallbladder ejection fraction is associated with disease gallbladder. She probably has small stones not seen on ultrasound. This is the only etiology of her right upper quadrant pain we can uncover. She is a poor surgical risk, but probably should have surgical consultation in any case. There is no other medical therapy we can order her for gallbladder disease. I would tell her to check with her primary care doctor for further followup.

## 2011-12-02 NOTE — Telephone Encounter (Signed)
Informed pt and her daughter of Dr Jarold Motto explanations of her problems and why she is a poor risk for surgery, but it is up to the Surgeon whether she can have surgery. Pt and her daughter stated understanding and are aware of the date to see Dr Carolynne Edouard.

## 2011-12-10 ENCOUNTER — Ambulatory Visit (INDEPENDENT_AMBULATORY_CARE_PROVIDER_SITE_OTHER): Payer: Medicare Other | Admitting: General Surgery

## 2011-12-10 ENCOUNTER — Encounter (INDEPENDENT_AMBULATORY_CARE_PROVIDER_SITE_OTHER): Payer: Self-pay | Admitting: General Surgery

## 2011-12-10 VITALS — BP 130/86 | HR 72 | Temp 96.9°F | Resp 18 | Ht 65.5 in | Wt 270.2 lb

## 2011-12-10 DIAGNOSIS — K828 Other specified diseases of gallbladder: Secondary | ICD-10-CM | POA: Insufficient documentation

## 2011-12-10 NOTE — Patient Instructions (Signed)
Call if you want to pursue gallbladder surgery

## 2011-12-10 NOTE — Progress Notes (Signed)
Subjective:     Patient ID: [name], female   DOB: 29-Apr-1933, 78 y.o.   MRN: [id number]  HPI We're asked to see the patient in consultation by Dr. Jarold Motto to evaluate her for biliary dyskinesia. The patient is a 78 year old black female who has been experiencing back pain that radiates around to her abdomen on the right side for at least the last 6 months. The pain seems to come and go. The pain does not occur every day. When the pain occurs it lasts for about 10 or 15 minutes and then subsides. The pain has been associated with some nausea and she's had a couple episodes of vomiting in the last 6 months. The pain is not brought on by food intake. As part of her workup she had a CT and ultrasound neither of which showed gallstones or gallbladder wall thickening. She did have a HIDA scan that showed a low gallbladder ejection fraction at 19%.  Review of Systems  Constitutional: Negative.   HENT: Negative.   Eyes: Negative.   Respiratory: Negative.   Cardiovascular: Negative.   Gastrointestinal: Positive for nausea and abdominal pain.  Genitourinary: Negative.   Musculoskeletal: Positive for back pain.  Skin: Negative.   Neurological: Negative.   Hematological: Negative.   Psychiatric/Behavioral: Negative.        Objective:   Physical Exam  Constitutional: She is oriented to person, place, and time.       Obese black female  HENT:  Head: Normocephalic and atraumatic.  Eyes: Conjunctivae normal and EOM are normal. Pupils are equal, round, and reactive to light.  Neck: Normal range of motion. Neck supple.  Cardiovascular: Normal rate, regular rhythm and normal heart sounds.   Pulmonary/Chest: Effort normal and breath sounds normal.  Abdominal: Soft. Bowel sounds are normal. She exhibits no mass. There is no tenderness.  Musculoskeletal: Normal range of motion.  Neurological: She is alert and oriented to person, place, and time.  Skin: Skin is warm and dry.  Psychiatric: She  has a normal mood and affect. Her behavior is normal.       Assessment:     The patient is having some nonspecific back and right-sided abdominal pain. She does have an abnormal HIDA scan which suggest that the gallbladder could be the source of her pain. Unfortunately the pattern of her pain is atypical for a biliary source and therefore I cannot guarantee that removing her gallbladder will make her better. I have discussed this with her and her family in detail and at this point the patient does not favor surgery.    Plan:     At this point the patient and family will consider the option for gallbladder surgery understanding that there is no guarantee that it will make her feel better. They will call back if they elect to schedule surgery. I have discussed with them in detail the risks and benefits of the operation as well as some of the technical aspects.

## 2011-12-30 ENCOUNTER — Other Ambulatory Visit: Payer: Self-pay | Admitting: Family Medicine

## 2012-01-01 NOTE — Telephone Encounter (Signed)
Rx sent.    MW 

## 2012-01-16 ENCOUNTER — Ambulatory Visit (INDEPENDENT_AMBULATORY_CARE_PROVIDER_SITE_OTHER): Payer: Medicare Other | Admitting: *Deleted

## 2012-01-16 DIAGNOSIS — Z23 Encounter for immunization: Secondary | ICD-10-CM

## 2012-03-30 ENCOUNTER — Other Ambulatory Visit: Payer: Self-pay | Admitting: Family Medicine

## 2012-04-27 ENCOUNTER — Other Ambulatory Visit: Payer: Self-pay | Admitting: Family Medicine

## 2012-05-27 ENCOUNTER — Other Ambulatory Visit: Payer: Self-pay | Admitting: Family Medicine

## 2012-06-28 ENCOUNTER — Other Ambulatory Visit: Payer: Self-pay | Admitting: Family Medicine

## 2012-07-27 ENCOUNTER — Other Ambulatory Visit: Payer: Self-pay | Admitting: Family Medicine

## 2012-08-26 ENCOUNTER — Other Ambulatory Visit: Payer: Self-pay | Admitting: Family Medicine

## 2012-09-17 ENCOUNTER — Encounter: Payer: Self-pay | Admitting: Family Medicine

## 2012-09-17 ENCOUNTER — Ambulatory Visit (INDEPENDENT_AMBULATORY_CARE_PROVIDER_SITE_OTHER): Payer: Medicare HMO | Admitting: Family Medicine

## 2012-09-17 VITALS — BP 130/80 | HR 69 | Temp 98.6°F | Ht 64.0 in | Wt 271.8 lb

## 2012-09-17 DIAGNOSIS — G20A1 Parkinson's disease without dyskinesia, without mention of fluctuations: Secondary | ICD-10-CM | POA: Insufficient documentation

## 2012-09-17 DIAGNOSIS — M545 Low back pain: Secondary | ICD-10-CM

## 2012-09-17 DIAGNOSIS — G20 Parkinson's disease: Secondary | ICD-10-CM | POA: Insufficient documentation

## 2012-09-17 DIAGNOSIS — Z1239 Encounter for other screening for malignant neoplasm of breast: Secondary | ICD-10-CM

## 2012-09-17 DIAGNOSIS — Z0000 Encounter for general adult medical examination without abnormal findings: Secondary | ICD-10-CM

## 2012-09-17 DIAGNOSIS — E2839 Other primary ovarian failure: Secondary | ICD-10-CM

## 2012-09-17 DIAGNOSIS — I10 Essential (primary) hypertension: Secondary | ICD-10-CM

## 2012-09-17 LAB — CBC WITH DIFFERENTIAL/PLATELET
Basophils Relative: 0.5 % (ref 0.0–3.0)
Eosinophils Relative: 7.4 % — ABNORMAL HIGH (ref 0.0–5.0)
HCT: 38.8 % (ref 36.0–46.0)
Lymphs Abs: 1.6 10*3/uL (ref 0.7–4.0)
MCV: 88 fl (ref 78.0–100.0)
Monocytes Absolute: 0.5 10*3/uL (ref 0.1–1.0)
Monocytes Relative: 10 % (ref 3.0–12.0)
RBC: 4.41 Mil/uL (ref 3.87–5.11)
WBC: 4.6 10*3/uL (ref 4.5–10.5)

## 2012-09-17 LAB — LIPID PANEL
Cholesterol: 226 mg/dL — ABNORMAL HIGH (ref 0–200)
Total CHOL/HDL Ratio: 5
Triglycerides: 158 mg/dL — ABNORMAL HIGH (ref 0.0–149.0)

## 2012-09-17 LAB — POCT URINALYSIS DIPSTICK
Bilirubin, UA: NEGATIVE
Glucose, UA: NEGATIVE
Ketones, UA: NEGATIVE
Leukocytes, UA: NEGATIVE
Nitrite, UA: NEGATIVE
pH, UA: 6.5

## 2012-09-17 LAB — HEPATIC FUNCTION PANEL
ALT: 8 U/L (ref 0–35)
AST: 9 U/L (ref 0–37)
Bilirubin, Direct: 0 mg/dL (ref 0.0–0.3)
Total Protein: 7.9 g/dL (ref 6.0–8.3)

## 2012-09-17 LAB — BASIC METABOLIC PANEL
Chloride: 108 mEq/L (ref 96–112)
GFR: 84.65 mL/min (ref >60.00)
Potassium: 4.1 mEq/L (ref 3.5–5.1)
Sodium: 141 mEq/L (ref 135–145)

## 2012-09-17 LAB — LDL CHOLESTEROL, DIRECT: Direct LDL: 155.7 mg/dL

## 2012-09-17 NOTE — Patient Instructions (Signed)
Preventive Care for Adults, Female A healthy lifestyle and preventive care can promote health and wellness. Preventive health guidelines for women include the following key practices.  A routine yearly physical is a good way to check with your caregiver about your health and preventive screening. It is a chance to share any concerns and updates on your health, and to receive a thorough exam.  Visit your dentist for a routine exam and preventive care every 6 months. Brush your teeth twice a day and floss once a day. Good oral hygiene prevents tooth decay and gum disease.  The frequency of eye exams is based on your age, health, family medical history, use of contact lenses, and other factors. Follow your caregiver's recommendations for frequency of eye exams.  Eat a healthy diet. Foods like vegetables, fruits, whole grains, low-fat dairy products, and lean protein foods contain the nutrients you need without too many calories. Decrease your intake of foods high in solid fats, added sugars, and salt. Eat the right amount of calories for you.Get information about a proper diet from your caregiver, if necessary.  Regular physical exercise is one of the most important things you can do for your health. Most adults should get at least 150 minutes of moderate-intensity exercise (any activity that increases your heart rate and causes you to sweat) each week. In addition, most adults need muscle-strengthening exercises on 2 or more days a week.  Maintain a healthy weight. The body mass index (BMI) is a screening tool to identify possible weight problems. It provides an estimate of body fat based on height and weight. Your caregiver can help determine your BMI, and can help you achieve or maintain a healthy weight.For adults 20 years and older:  A BMI below 18.5 is considered underweight.  A BMI of 18.5 to 24.9 is normal.  A BMI of 25 to 29.9 is considered overweight.  A BMI of 30 and above is  considered obese.  Maintain normal blood lipids and cholesterol levels by exercising and minimizing your intake of saturated fat. Eat a balanced diet with plenty of fruit and vegetables. Blood tests for lipids and cholesterol should begin at age 20 and be repeated every 5 years. If your lipid or cholesterol levels are high, you are over 50, or you are at high risk for heart disease, you may need your cholesterol levels checked more frequently.Ongoing high lipid and cholesterol levels should be treated with medicines if diet and exercise are not effective.  If you smoke, find out from your caregiver how to quit. If you do not use tobacco, do not start.  If you are pregnant, do not drink alcohol. If you are breastfeeding, be very cautious about drinking alcohol. If you are not pregnant and choose to drink alcohol, do not exceed 1 drink per day. One drink is considered to be 12 ounces (355 mL) of beer, 5 ounces (148 mL) of wine, or 1.5 ounces (44 mL) of liquor.  Avoid use of street drugs. Do not share needles with anyone. Ask for help if you need support or instructions about stopping the use of drugs.  High blood pressure causes heart disease and increases the risk of stroke. Your blood pressure should be checked at least every 1 to 2 years. Ongoing high blood pressure should be treated with medicines if weight loss and exercise are not effective.  If you are 55 to 77 years old, ask your caregiver if you should take aspirin to prevent strokes.  Diabetes   screening involves taking a blood sample to check your fasting blood sugar level. This should be done once every 3 years, after age 45, if you are within normal weight and without risk factors for diabetes. Testing should be considered at a younger age or be carried out more frequently if you are overweight and have at least 1 risk factor for diabetes.  Breast cancer screening is essential preventive care for women. You should practice "breast  self-awareness." This means understanding the normal appearance and feel of your breasts and may include breast self-examination. Any changes detected, no matter how small, should be reported to a caregiver. Women in their 20s and 30s should have a clinical breast exam (CBE) by a caregiver as part of a regular health exam every 1 to 3 years. After age 40, women should have a CBE every year. Starting at age 40, women should consider having a mammography (breast X-ray test) every year. Women who have a family history of breast cancer should talk to their caregiver about genetic screening. Women at a high risk of breast cancer should talk to their caregivers about having magnetic resonance imaging (MRI) and a mammography every year.  The Pap test is a screening test for cervical cancer. A Pap test can show cell changes on the cervix that might become cervical cancer if left untreated. A Pap test is a procedure in which cells are obtained and examined from the lower end of the uterus (cervix).  Women should have a Pap test starting at age 21.  Between ages 21 and 29, Pap tests should be repeated every 2 years.  Beginning at age 30, you should have a Pap test every 3 years as long as the past 3 Pap tests have been normal.  Some women have medical problems that increase the chance of getting cervical cancer. Talk to your caregiver about these problems. It is especially important to talk to your caregiver if a new problem develops soon after your last Pap test. In these cases, your caregiver may recommend more frequent screening and Pap tests.  The above recommendations are the same for women who have or have not gotten the vaccine for human papillomavirus (HPV).  If you had a hysterectomy for a problem that was not cancer or a condition that could lead to cancer, then you no longer need Pap tests. Even if you no longer need a Pap test, a regular exam is a good idea to make sure no other problems are  starting.  If you are between ages 65 and 70, and you have had normal Pap tests going back 10 years, you no longer need Pap tests. Even if you no longer need a Pap test, a regular exam is a good idea to make sure no other problems are starting.  If you have had past treatment for cervical cancer or a condition that could lead to cancer, you need Pap tests and screening for cancer for at least 20 years after your treatment.  If Pap tests have been discontinued, risk factors (such as a new sexual partner) need to be reassessed to determine if screening should be resumed.  The HPV test is an additional test that may be used for cervical cancer screening. The HPV test looks for the virus that can cause the cell changes on the cervix. The cells collected during the Pap test can be tested for HPV. The HPV test could be used to screen women aged 30 years and older, and should   be used in women of any age who have unclear Pap test results. After the age of 30, women should have HPV testing at the same frequency as a Pap test.  Colorectal cancer can be detected and often prevented. Most routine colorectal cancer screening begins at the age of 50 and continues through age 75. However, your caregiver may recommend screening at an earlier age if you have risk factors for colon cancer. On a yearly basis, your caregiver may provide home test kits to check for hidden blood in the stool. Use of a small camera at the end of a tube, to directly examine the colon (sigmoidoscopy or colonoscopy), can detect the earliest forms of colorectal cancer. Talk to your caregiver about this at age 50, when routine screening begins. Direct examination of the colon should be repeated every 5 to 10 years through age 75, unless early forms of pre-cancerous polyps or small growths are found.  Hepatitis C blood testing is recommended for all people born from 1945 through 1965 and any individual with known risks for hepatitis C.  Practice  safe sex. Use condoms and avoid high-risk sexual practices to reduce the spread of sexually transmitted infections (STIs). STIs include gonorrhea, chlamydia, syphilis, trichomonas, herpes, HPV, and human immunodeficiency virus (HIV). Herpes, HIV, and HPV are viral illnesses that have no cure. They can result in disability, cancer, and death. Sexually active women aged 25 and younger should be checked for chlamydia. Older women with new or multiple partners should also be tested for chlamydia. Testing for other STIs is recommended if you are sexually active and at increased risk.  Osteoporosis is a disease in which the bones lose minerals and strength with aging. This can result in serious bone fractures. The risk of osteoporosis can be identified using a bone density scan. Women ages 65 and over and women at risk for fractures or osteoporosis should discuss screening with their caregivers. Ask your caregiver whether you should take a calcium supplement or vitamin D to reduce the rate of osteoporosis.  Menopause can be associated with physical symptoms and risks. Hormone replacement therapy is available to decrease symptoms and risks. You should talk to your caregiver about whether hormone replacement therapy is right for you.  Use sunscreen with sun protection factor (SPF) of 30 or more. Apply sunscreen liberally and repeatedly throughout the day. You should seek shade when your shadow is shorter than you. Protect yourself by wearing long sleeves, pants, a wide-brimmed hat, and sunglasses year round, whenever you are outdoors.  Once a month, do a whole body skin exam, using a mirror to look at the skin on your back. Notify your caregiver of new moles, moles that have irregular borders, moles that are larger than a pencil eraser, or moles that have changed in shape or color.  Stay current with required immunizations.  Influenza. You need a dose every fall (or winter). The composition of the flu vaccine  changes each year, so being vaccinated once is not enough.  Pneumococcal polysaccharide. You need 1 to 2 doses if you smoke cigarettes or if you have certain chronic medical conditions. You need 1 dose at age 65 (or older) if you have never been vaccinated.  Tetanus, diphtheria, pertussis (Tdap, Td). Get 1 dose of Tdap vaccine if you are younger than age 65, are over 65 and have contact with an infant, are a healthcare worker, are pregnant, or simply want to be protected from whooping cough. After that, you need a Td   booster dose every 10 years. Consult your caregiver if you have not had at least 3 tetanus and diphtheria-containing shots sometime in your life or have a deep or dirty wound.  HPV. You need this vaccine if you are a woman age 26 or younger. The vaccine is given in 3 doses over 6 months.  Measles, mumps, rubella (MMR). You need at least 1 dose of MMR if you were born in 1957 or later. You may also need a second dose.  Meningococcal. If you are age 19 to 21 and a first-year college student living in a residence hall, or have one of several medical conditions, you need to get vaccinated against meningococcal disease. You may also need additional booster doses.  Zoster (shingles). If you are age 60 or older, you should get this vaccine.  Varicella (chickenpox). If you have never had chickenpox or you were vaccinated but received only 1 dose, talk to your caregiver to find out if you need this vaccine.  Hepatitis A. You need this vaccine if you have a specific risk factor for hepatitis A virus infection or you simply wish to be protected from this disease. The vaccine is usually given as 2 doses, 6 to 18 months apart.  Hepatitis B. You need this vaccine if you have a specific risk factor for hepatitis B virus infection or you simply wish to be protected from this disease. The vaccine is given in 3 doses, usually over 6 months. Preventive Services / Frequency Ages 19 to 39  Blood  pressure check.** / Every 1 to 2 years.  Lipid and cholesterol check.** / Every 5 years beginning at age 20.  Clinical breast exam.** / Every 3 years for women in their 20s and 30s.  Pap test.** / Every 2 years from ages 21 through 29. Every 3 years starting at age 30 through age 65 or 70 with a history of 3 consecutive normal Pap tests.  HPV screening.** / Every 3 years from ages 30 through ages 65 to 70 with a history of 3 consecutive normal Pap tests.  Hepatitis C blood test.** / For any individual with known risks for hepatitis C.  Skin self-exam. / Monthly.  Influenza immunization.** / Every year.  Pneumococcal polysaccharide immunization.** / 1 to 2 doses if you smoke cigarettes or if you have certain chronic medical conditions.  Tetanus, diphtheria, pertussis (Tdap, Td) immunization. / A one-time dose of Tdap vaccine. After that, you need a Td booster dose every 10 years.  HPV immunization. / 3 doses over 6 months, if you are 26 and younger.  Measles, mumps, rubella (MMR) immunization. / You need at least 1 dose of MMR if you were born in 1957 or later. You may also need a second dose.  Meningococcal immunization. / 1 dose if you are age 19 to 21 and a first-year college student living in a residence hall, or have one of several medical conditions, you need to get vaccinated against meningococcal disease. You may also need additional booster doses.  Varicella immunization.** / Consult your caregiver.  Hepatitis A immunization.** / Consult your caregiver. 2 doses, 6 to 18 months apart.  Hepatitis B immunization.** / Consult your caregiver. 3 doses usually over 6 months. Ages 40 to 64  Blood pressure check.** / Every 1 to 2 years.  Lipid and cholesterol check.** / Every 5 years beginning at age 20.  Clinical breast exam.** / Every year after age 40.  Mammogram.** / Every year beginning at age 40   and continuing for as long as you are in good health. Consult with your  caregiver.  Pap test.** / Every 3 years starting at age 30 through age 65 or 70 with a history of 3 consecutive normal Pap tests.  HPV screening.** / Every 3 years from ages 30 through ages 65 to 70 with a history of 3 consecutive normal Pap tests.  Fecal occult blood test (FOBT) of stool. / Every year beginning at age 50 and continuing until age 75. You may not need to do this test if you get a colonoscopy every 10 years.  Flexible sigmoidoscopy or colonoscopy.** / Every 5 years for a flexible sigmoidoscopy or every 10 years for a colonoscopy beginning at age 50 and continuing until age 75.  Hepatitis C blood test.** / For all people born from 1945 through 1965 and any individual with known risks for hepatitis C.  Skin self-exam. / Monthly.  Influenza immunization.** / Every year.  Pneumococcal polysaccharide immunization.** / 1 to 2 doses if you smoke cigarettes or if you have certain chronic medical conditions.  Tetanus, diphtheria, pertussis (Tdap, Td) immunization.** / A one-time dose of Tdap vaccine. After that, you need a Td booster dose every 10 years.  Measles, mumps, rubella (MMR) immunization. / You need at least 1 dose of MMR if you were born in 1957 or later. You may also need a second dose.  Varicella immunization.** / Consult your caregiver.  Meningococcal immunization.** / Consult your caregiver.  Hepatitis A immunization.** / Consult your caregiver. 2 doses, 6 to 18 months apart.  Hepatitis B immunization.** / Consult your caregiver. 3 doses, usually over 6 months. Ages 65 and over  Blood pressure check.** / Every 1 to 2 years.  Lipid and cholesterol check.** / Every 5 years beginning at age 20.  Clinical breast exam.** / Every year after age 40.  Mammogram.** / Every year beginning at age 40 and continuing for as long as you are in good health. Consult with your caregiver.  Pap test.** / Every 3 years starting at age 30 through age 65 or 70 with a 3  consecutive normal Pap tests. Testing can be stopped between 65 and 70 with 3 consecutive normal Pap tests and no abnormal Pap or HPV tests in the past 10 years.  HPV screening.** / Every 3 years from ages 30 through ages 65 or 70 with a history of 3 consecutive normal Pap tests. Testing can be stopped between 65 and 70 with 3 consecutive normal Pap tests and no abnormal Pap or HPV tests in the past 10 years.  Fecal occult blood test (FOBT) of stool. / Every year beginning at age 50 and continuing until age 75. You may not need to do this test if you get a colonoscopy every 10 years.  Flexible sigmoidoscopy or colonoscopy.** / Every 5 years for a flexible sigmoidoscopy or every 10 years for a colonoscopy beginning at age 50 and continuing until age 75.  Hepatitis C blood test.** / For all people born from 1945 through 1965 and any individual with known risks for hepatitis C.  Osteoporosis screening.** / A one-time screening for women ages 65 and over and women at risk for fractures or osteoporosis.  Skin self-exam. / Monthly.  Influenza immunization.** / Every year.  Pneumococcal polysaccharide immunization.** / 1 dose at age 65 (or older) if you have never been vaccinated.  Tetanus, diphtheria, pertussis (Tdap, Td) immunization. / A one-time dose of Tdap vaccine if you are over   65 and have contact with an infant, are a healthcare worker, or simply want to be protected from whooping cough. After that, you need a Td booster dose every 10 years.  Varicella immunization.** / Consult your caregiver.  Meningococcal immunization.** / Consult your caregiver.  Hepatitis A immunization.** / Consult your caregiver. 2 doses, 6 to 18 months apart.  Hepatitis B immunization.** / Check with your caregiver. 3 doses, usually over 6 months. ** Family history and personal history of risk and conditions may change your caregiver's recommendations. Document Released: 04/30/2001 Document Revised: 05/27/2011  Document Reviewed: 07/30/2010 [employer] Patient Information 2014 [employer]  

## 2012-09-17 NOTE — Progress Notes (Signed)
Subjective:    [name] is a 79 y.o. female who presents for Medicare Annual/Subsequent preventive examination.  Preventive Screening-Counseling & Management  Tobacco History  Smoking status  . Never Smoker   Smokeless tobacco  . Never Used     Problems Prior to Visit 1. none  Current Problems (verified) Patient Active Problem List   Diagnosis Date Noted  . Biliary dyskinesia 12/10/2011  . UTI (urinary tract infection) 07/24/2011  . Lumbar back pain 06/12/2011  . Depression 09/28/2010  . Pre-operative cardiovascular examination 07/23/2010  . CAROTID BRUIT, RIGHT 06/29/2009  . CHEST PAIN-UNSPECIFIED 01/13/2009  . SKIN RASH 10/11/2008  . EDEMA 08/25/2008  . OTHER URINARY INCONTINENCE 02/15/2008  . CHRONIC PAIN SYNDROME 12/31/2007  . BRUISE 09/14/2007  . SCOLIOSIS, LUMBAR SPINE 06/15/2007  . BACK PAIN, THORACIC REGION, RIGHT 06/11/2007  . MENISCUS TEAR 02/25/2007  . OTHER SYMPTOMS REFERABLE TO LOWER LEG JOINT 02/17/2007  . MYALGIA 02/10/2007  . CALF PAIN, RIGHT 02/10/2007  . DEPRESSION 01/01/2007  . HYPOKALEMIA, HX OF 01/01/2007  . TINEA CORPORIS 09/22/2006  . ALLERGIC DRUG REACTION, OPIOIDS 09/22/2006  . HYPERTENSION 08/20/2006  . OSTEOARTHRITIS 08/20/2006    Medications Prior to Visit Current Outpatient Prescriptions on File Prior to Visit  Medication Sig Dispense Refill  . carbidopa-levodopa (SINEMET) 25-100 MG per tablet 2 tablets 4 (four) times daily.       Marland Kitchen EXFORGE 10-320 MG per tablet TAKE 1 TABLET BY MOUTH ONCE DAILY  30 tablet  2  . olanzapine-FLUoxetine (SYMBYAX) 12-50 MG per capsule take 1 capsule by mouth every evening  30 capsule  2   No current facility-administered medications on file prior to visit.    Current Medications (verified) Current Outpatient Prescriptions  Medication Sig Dispense Refill  . amantadine (SYMMETREL) 100 MG capsule Take 100 mg by mouth 2 (two) times daily.      . carbidopa-levodopa (SINEMET) 25-100 MG per tablet 2  tablets 4 (four) times daily.       Marland Kitchen EXFORGE 10-320 MG per tablet TAKE 1 TABLET BY MOUTH ONCE DAILY  30 tablet  2  . olanzapine-FLUoxetine (SYMBYAX) 12-50 MG per capsule take 1 capsule by mouth every evening  30 capsule  2   No current facility-administered medications for this visit.     Allergies (verified) Codeine; Gabapentin; Latex; Morphine sulfate; Penicillins; and Sertraline hcl   PAST HISTORY  Family History Family History  Problem Relation Age of Onset  . Stroke Mother   . Parkinsonism Mother     Social History History  Substance Use Topics  . Smoking status: Never Smoker   . Smokeless tobacco: Never Used  . Alcohol Use: No     Are there smokers in your home (other than you)? No  Risk Factors Current exercise habits: silver sneakers 3x a week  Dietary issues discussed: na   Cardiac risk factors: advanced age (older than 92 for men, 70 for women), hypertension and obesity (BMI >= 30 kg/m2).  Depression Screen (Note: if answer to either of the following is "Yes", a more complete depression screening is indicated)   Over the past two weeks, have you felt down, depressed or hopeless? No  Over the past two weeks, have you felt little interest or pleasure in doing things? No  Have you lost interest or pleasure in daily life? No  Do you often feel hopeless? No  Do you cry easily over simple problems? No  Activities of Daily Living In your present state of health, do  you have any difficulty performing the following activities?:  Driving? No-- only short distances Managing money?  No Feeding yourself? No Getting from bed to chair? No Climbing a flight of stairs? Yes Preparing food and eating?: Yes---daughter cooks Bathing or showering? No Getting dressed: No Getting to the toilet? No Using the toilet:No Moving around from place to place: No In the past year have you fallen or had a near fall?:No   Are you sexually active?  No  Do you have more than one  partner?  No  Hearing Difficulties: No Do you often ask people to speak up or repeat themselves? No Do you experience ringing or noises in your ears? No Do you have difficulty understanding soft or whispered voices? No   Do you feel that you have a problem with memory? No  Do you often misplace items? No  Do you feel safe at home?  Yes  Cognitive Testing  Alert? Yes  Normal Appearance?Yes  Oriented to person? Yes  Place? Yes   Time? Yes  Recall of three objects?  Yes  Can perform simple calculations? Yes  Displays appropriate judgment?Yes  Can read the correct time from a watch face?Yes   Advanced Directives have been discussed with the patient? Yes  List the Names of Other Physician/Practitioners you currently use: 1.  opth--[facility] 2  Dentist--  3. Neurologist--  Dan Humphre[facility] at [facility] any recent Medical Services you may have received from other than [facility] providers in the past year (date may be approximate).  Immunization History  Administered Date(s) Administered  . Influenza Split 01/16/2012  . Influenza Whole 12/09/2007  . Pneumococcal Polysaccharide 03/18/2002    Screening Tests Health Maintenance  Topic Date Due  . Tetanus/tdap  02/24/1953  . Influenza Vaccine  11/16/2012  . Colonoscopy  07/02/2020  . Pneumococcal Polysaccharide Vaccine Age 51 And Over  Completed  . Zostavax  Addressed    All answers were reviewed with the patient and necessary referrals were made:  Loreen Freud, DO   09/17/2012   History reviewed:  She  has a past medical history of Scoliosis; Meniscus tear; Depression; Hypertension; Osteoarthritis; Myalgia; Calf pain; Hypokalemia; Other symptoms referable to lower leg joint; Back pain, thoracic; Bruise; Chronic pain syndrome; Other urinary incontinence; Edema; Skin rash; Gallstones; Chest pain; Benign familial tremor; Allergic drug reaction; and Tinea corporis. She  does not have any pertinent problems on file. She  has past surgical  history that includes Abdominal hysterectomy; Rotator cuff repair; Lumbar laminectomy/decompression microdiscectomy (2007); Knee arthroscopy; and Breast lumpectomy. Her family history includes Parkinsonism in her mother and Stroke in her mother. She  reports that she has never smoked. She has never used smokeless tobacco. She reports that she does not drink alcohol or use illicit drugs. She has a current medication list which includes the following prescription(s): amantadine, carbidopa-levodopa, exforge, and olanzapine-fluoxetine. Current Outpatient Prescriptions on File Prior to Visit  Medication Sig Dispense Refill  . carbidopa-levodopa (SINEMET) 25-100 MG per tablet 2 tablets 4 (four) times daily.       Marland Kitchen EXFORGE 10-320 MG per tablet TAKE 1 TABLET BY MOUTH ONCE DAILY  30 tablet  2  . olanzapine-FLUoxetine (SYMBYAX) 12-50 MG per capsule take 1 capsule by mouth every evening  30 capsule  2   No current facility-administered medications on file prior to visit.   She is allergic to codeine; gabapentin; latex; morphine sulfate; penicillins; and sertraline hcl.  Review of Systems  Review of Systems  Constitutional:  Negative for activity change, appetite change and fatigue.  HENT: Negative for hearing loss, congestion, tinnitus and ear discharge.   Eyes: Negative for visual disturbance (see optho due -- vision corrected to 20/20 with glasses).  Respiratory: Negative for cough, chest tightness and shortness of breath.   Cardiovascular: Negative for chest pain, palpitations and leg swelling.  Gastrointestinal: Negative for abdominal pain, diarrhea, constipation and abdominal distention.  Genitourinary: Negative for urgency, frequency, decreased urine volume and difficulty urinating.  Musculoskeletal: Negative for back pain, arthralgias and gait problem.  Skin: Negative for color change, pallor and rash.  Neurological: Negative for dizziness, light-headedness, numbness and headaches.   Hematological: Negative for adenopathy. Does not bruise/bleed easily.  Psychiatric/Behavioral: Negative for suicidal ideas, confusion, sleep disturbance, self-injury, dysphoric mood, decreased concentration and agitation.  Pt is able to read and write and can do all ADLs No risk for falling No abuse/ violence in home     Objective:     Vision by Snellen chart: opth Body mass index is 46.63 kg/(m^2). BP 130/80  Pulse 69  Temp(Src) 98.6 F (37 C) (Oral)  Ht 5\' 4"  (1.626 m)  Wt 271 lb 12.8 oz (123.288 kg)  BMI 46.63 kg/m2  SpO2 96%  BP 130/80  Pulse 69  Temp(Src) 98.6 F (37 C) (Oral)  Ht 5\' 4"  (1.626 m)  Wt 271 lb 12.8 oz (123.288 kg)  BMI 46.63 kg/m2  SpO2 96% General appearance: alert, cooperative, appears stated age and no distress Head: Normocephalic, without obvious abnormality, atraumatic Eyes: negative findings: lids and lashes normal, conjunctivae and sclerae normal and pupils equal, round, reactive to light and accomodation Ears: normal TM's and external ear canals both ears Nose: Nares normal. Septum midline. Mucosa normal. No drainage or sinus tenderness. Throat: lips, mucosa, and tongue normal; teeth and gums normal Neck: no adenopathy, no carotid bruit, no JVD, supple, symmetrical, trachea midline and thyroid not enlarged, symmetric, no tenderness/mass/nodules Back: symmetric, no curvature. ROM normal. No CVA tenderness. Lungs: clear to auscultation bilaterally Breasts: normal appearance, no masses or tenderness Heart: S1, S2 normal    +murmur  2/6 Abdomen: soft, non-tender; bowel sounds normal; no masses,  no organomegaly Pelvic: not indicated; post-menopausal, no abnormal Pap smears in past Extremities: extremities normal, atraumatic, no cyanosis or edema Pulses: 2+ and symmetric Skin: Skin color, texture, turgor normal. No rashes or lesions Lymph nodes: Cervical, supraclavicular, and axillary nodes normal. Neurologic: Alert and oriented X 3, normal  strength and tone. Normal symmetric reflexes. Normal coordination and gait Psych- no depression, no anxiety      Assessment:     cpe      Plan:     During the course of the visit the patient was educated and counseled about appropriate screening and preventive services including:    Pneumococcal vaccine   Influenza vaccine  Screening mammography  Bone densitometry screening  Colorectal cancer screening  Diabetes screening  Glaucoma screening  Advanced directives: has an advanced directive - a copy HAS NOT been provided.  Diet review for nutrition referral? Yes ____  Not Indicated _x___   Patient Instructions (the written plan) was given to the patient.  Medicare Attestation I have personally reviewed: The patient's medical and social history Their use of alcohol, tobacco or illicit drugs Their current medications and supplements The patient's functional ability including ADLs,fall risks, home safety risks, cognitive, and hearing and visual impairment Diet and physical activities Evidence for depression or mood disorders  The patient's weight, height, BMI, and visual acuity  have been recorded in the chart.  I have made referrals, counseling, and provided education to the patient based on review of the above and I have provided the patient with a written personalized care plan for preventive services.     Loreen Freud, DO   09/17/2012

## 2012-09-17 NOTE — Assessment & Plan Note (Signed)
Per neuro 

## 2012-09-18 LAB — MICROALBUMIN / CREATININE URINE RATIO
Creatinine, Urine: 124.5 mg/dL
Microalb Creat Ratio: 4 mg/g (ref 0.0–30.0)

## 2012-09-18 NOTE — Assessment & Plan Note (Signed)
Stable con't meds 

## 2012-09-18 NOTE — Assessment & Plan Note (Signed)
stable °

## 2012-09-22 ENCOUNTER — Other Ambulatory Visit: Payer: Self-pay | Admitting: Family Medicine

## 2012-09-30 ENCOUNTER — Other Ambulatory Visit: Payer: Self-pay

## 2012-09-30 MED ORDER — PRAVASTATIN SODIUM 20 MG PO TABS: 20.0000 mg | ORAL_TABLET | Freq: Every day | ORAL | Status: DC

## 2012-10-09 ENCOUNTER — Ambulatory Visit
Admission: RE | Admit: 2012-10-09 | Discharge: 2012-10-09 | Disposition: A | Discharge status: Home / Self Care | Payer: Medicare HMO | Source: Ambulatory Visit | Attending: Family Medicine | Admitting: Family Medicine

## 2012-10-09 DIAGNOSIS — E2839 Other primary ovarian failure: Secondary | ICD-10-CM

## 2012-10-09 DIAGNOSIS — Z1239 Encounter for other screening for malignant neoplasm of breast: Secondary | ICD-10-CM

## 2012-12-28 ENCOUNTER — Other Ambulatory Visit: Payer: Self-pay | Admitting: Family Medicine

## 2012-12-29 ENCOUNTER — Telehealth: Payer: Self-pay | Admitting: *Deleted

## 2012-12-29 NOTE — Telephone Encounter (Signed)
Refill for pravachol sent to [facility]

## 2013-01-19 ENCOUNTER — Ambulatory Visit (INDEPENDENT_AMBULATORY_CARE_PROVIDER_SITE_OTHER): Payer: Medicare HMO

## 2013-01-19 DIAGNOSIS — Z23 Encounter for immunization: Secondary | ICD-10-CM

## 2013-01-28 ENCOUNTER — Other Ambulatory Visit: Payer: Self-pay | Admitting: Family Medicine

## 2013-01-28 NOTE — Telephone Encounter (Signed)
Desoximetasone cream refilled

## 2013-03-27 ENCOUNTER — Other Ambulatory Visit: Payer: Self-pay | Admitting: Family Medicine

## 2013-04-05 ENCOUNTER — Other Ambulatory Visit: Payer: Self-pay | Admitting: Family Medicine

## 2013-04-25 ENCOUNTER — Other Ambulatory Visit: Payer: Self-pay | Admitting: Family Medicine

## 2013-05-24 ENCOUNTER — Other Ambulatory Visit: Payer: Self-pay | Admitting: Family Medicine

## 2013-06-21 ENCOUNTER — Other Ambulatory Visit: Payer: Self-pay | Admitting: Family Medicine

## 2013-07-01 ENCOUNTER — Encounter: Payer: Self-pay | Admitting: Family Medicine

## 2013-07-01 ENCOUNTER — Ambulatory Visit (INDEPENDENT_AMBULATORY_CARE_PROVIDER_SITE_OTHER): Payer: Medicare HMO | Admitting: Family Medicine

## 2013-07-01 VITALS — BP 138/80 | HR 90 | Temp 98.2°F | Wt 281.0 lb

## 2013-07-01 DIAGNOSIS — F319 Bipolar disorder, unspecified: Secondary | ICD-10-CM

## 2013-07-01 DIAGNOSIS — I10 Essential (primary) hypertension: Secondary | ICD-10-CM

## 2013-07-01 DIAGNOSIS — E785 Hyperlipidemia, unspecified: Secondary | ICD-10-CM

## 2013-07-01 DIAGNOSIS — E669 Obesity, unspecified: Secondary | ICD-10-CM | POA: Insufficient documentation

## 2013-07-01 DIAGNOSIS — G20 Parkinson's disease: Secondary | ICD-10-CM

## 2013-07-01 MED ORDER — OLANZAPINE-FLUOXETINE HCL 12-50 MG PO CAPS: ORAL_CAPSULE | ORAL | Status: DC

## 2013-07-01 MED ORDER — AMANTADINE HCL 100 MG PO CAPS: 100.0000 mg | ORAL_CAPSULE | Freq: Two times a day (BID) | ORAL | Status: DC

## 2013-07-01 MED ORDER — AMLODIPINE BESYLATE-VALSARTAN 10-320 MG PO TABS: ORAL_TABLET | ORAL | Status: DC

## 2013-07-01 NOTE — Patient Instructions (Signed)

## 2013-07-01 NOTE — Progress Notes (Signed)
  Subjective:    Patient here for follow-up of elevated blood pressure.  She is not exercising and is adherent to a low-salt diet.  Blood pressure is well controlled at home. Cardiac symptoms: none. Patient denies: chest pain, chest pressure/discomfort, claudication, dyspnea, exertional chest pressure/discomfort, fatigue, irregular heart beat, near-syncope, orthopnea, palpitations, paroxysmal nocturnal dyspnea, syncope and tachypnea. Cardiovascular risk factors: advanced age (older than 4455 for men, 165 for women), diabetes mellitus, dyslipidemia, hypertension, obesity (BMI >= 30 kg/m2) and sedentary lifestyle. Use of agents associated with hypertension: none. History of target organ damage: none.  The following portions of the patient's history were reviewed and updated as appropriate: allergies, current medications, past family history, past medical history, past social history, past surgical history and problem list.  Review of Systems Pertinent items are noted in HPI.     Objective:    BP 138/80  Pulse 90  Temp(Src) 98.2 F (36.8 C) (Oral)  Wt 281 lb (127.461 kg)  SpO2 95% General appearance: alert, cooperative, appears stated age and no distress Nose: Nares normal. Septum midline. Mucosa normal. No drainage or sinus tenderness. Throat: lips, mucosa, and tongue normal; teeth and gums normal Neck: no adenopathy, supple, symmetrical, trachea midline and thyroid not enlarged, symmetric, no tenderness/mass/nodules Lungs: clear to auscultation bilaterally Heart: S1, S2 normal Extremities: edema no change in edema both feet    Assessment:    Hypertension, normal blood pressure . Evidence of target organ damage: none.    Plan:    Medication: no change. Dietary sodium restriction. Regular aerobic exercise. Follow up: 6 months and as needed.   1. Parkinson disease F/u neuro - amantadine (SYMMETREL) 100 MG capsule; Take 1 capsule (100 mg total) by mouth 2 (two) times daily.  Dispense:  60 capsule; Refill: 5  2. HTN (hypertension) Stable, con't meds - amLODipine-valsartan (EXFORGE) 10-320 MG per tablet; take 1 tablet by mouth once daily  Dispense: 30 tablet; Refill: 5 - Basic metabolic panel  3. Bipolar disorder, unspecified stable - OLANZapine-FLUoxetine (SYMBYAX) 12-50 MG per capsule; take 1 capsule by mouth every evening  Dispense: 30 capsule; Refill: 5  4. Other and unspecified hyperlipidemia Check la bs - Hepatic function panel - Lipid panel

## 2013-07-01 NOTE — Progress Notes (Signed)
Pre visit review using our clinic review tool, if applicable. No additional management support is needed unless otherwise documented below in the visit note. 

## 2013-07-02 ENCOUNTER — Telehealth: Payer: Self-pay | Admitting: Family Medicine

## 2013-07-02 LAB — HEPATIC FUNCTION PANEL
ALBUMIN: 3.9 g/dL (ref 3.5–5.2)
ALK PHOS: 80 U/L (ref 39–117)
ALT: 7 U/L (ref 0–35)
AST: 11 U/L (ref 0–37)
Bilirubin, Direct: 0 mg/dL (ref 0.0–0.3)
TOTAL PROTEIN: 7.5 g/dL (ref 6.0–8.3)
Total Bilirubin: 0.6 mg/dL (ref 0.3–1.2)

## 2013-07-02 LAB — BASIC METABOLIC PANEL
BUN: 20 mg/dL (ref 6–23)
CO2: 24 meq/L (ref 19–32)
Calcium: 9.8 mg/dL (ref 8.4–10.5)
Chloride: 111 mEq/L (ref 96–112)
Creatinine, Ser: 0.9 mg/dL (ref 0.4–1.2)
GFR: 78.01 mL/min (ref >60.00)
GLUCOSE: 90 mg/dL (ref 70–99)
POTASSIUM: 4.5 meq/L (ref 3.5–5.1)
SODIUM: 145 meq/L (ref 135–145)

## 2013-07-02 LAB — LIPID PANEL
Cholesterol: 162 mg/dL (ref 0–200)
HDL: 40.7 mg/dL (ref >39.00)
LDL Cholesterol: 90 mg/dL (ref 0–99)
Total CHOL/HDL Ratio: 4
Triglycerides: 159 mg/dL — ABNORMAL HIGH (ref 0.0–149.0)
VLDL: 31.8 mg/dL (ref 0.0–40.0)

## 2013-07-02 NOTE — Telephone Encounter (Signed)
Relevant patient education assigned to patient using Emmi. ° °

## 2013-07-22 ENCOUNTER — Other Ambulatory Visit: Payer: Self-pay | Admitting: Family Medicine

## 2013-08-31 ENCOUNTER — Encounter: Payer: Self-pay | Admitting: Family Medicine

## 2013-08-31 ENCOUNTER — Telehealth: Payer: Self-pay

## 2013-08-31 ENCOUNTER — Ambulatory Visit (INDEPENDENT_AMBULATORY_CARE_PROVIDER_SITE_OTHER): Payer: Medicare HMO | Admitting: Family Medicine

## 2013-08-31 ENCOUNTER — Ambulatory Visit (HOSPITAL_BASED_OUTPATIENT_CLINIC_OR_DEPARTMENT_OTHER)
Admission: RE | Admit: 2013-08-31 | Discharge: 2013-08-31 | Disposition: A | Discharge status: Home / Self Care | Payer: Medicare HMO | Source: Ambulatory Visit | Attending: Family Medicine | Admitting: Family Medicine

## 2013-08-31 VITALS — BP 132/72 | HR 74 | Temp 97.8°F | Wt 278.0 lb

## 2013-08-31 DIAGNOSIS — M5137 Other intervertebral disc degeneration, lumbosacral region: Secondary | ICD-10-CM | POA: Insufficient documentation

## 2013-08-31 DIAGNOSIS — Q762 Congenital spondylolisthesis: Secondary | ICD-10-CM | POA: Insufficient documentation

## 2013-08-31 DIAGNOSIS — M51379 Other intervertebral disc degeneration, lumbosacral region without mention of lumbar back pain or lower extremity pain: Secondary | ICD-10-CM | POA: Insufficient documentation

## 2013-08-31 DIAGNOSIS — M549 Dorsalgia, unspecified: Secondary | ICD-10-CM

## 2013-08-31 DIAGNOSIS — IMO0002 Reserved for concepts with insufficient information to code with codable children: Secondary | ICD-10-CM | POA: Insufficient documentation

## 2013-08-31 DIAGNOSIS — M4856XA Collapsed vertebra, not elsewhere classified, lumbar region, initial encounter for fracture: Secondary | ICD-10-CM

## 2013-08-31 DIAGNOSIS — M4120 Other idiopathic scoliosis, site unspecified: Secondary | ICD-10-CM | POA: Insufficient documentation

## 2013-08-31 MED ORDER — TIZANIDINE HCL 4 MG PO TABS: 4.0000 mg | ORAL_TABLET | Freq: Four times a day (QID) | ORAL | Status: DC | PRN

## 2013-08-31 NOTE — Telephone Encounter (Signed)
Message copied by Arnette NorrisPAYNE, Chay Mazzoni P on Tue Aug 31, 2013  3:30 PM ------      Message from: Lelon PerlaLOWNE, YVONNE R      Created: Tue Aug 31, 2013  1:48 PM       ? Compression fracture- need MRI LS spine no contrast ------

## 2013-08-31 NOTE — Progress Notes (Signed)
Pre visit review using our clinic review tool, if applicable. No additional management support is needed unless otherwise documented below in the visit note. 

## 2013-08-31 NOTE — Progress Notes (Signed)
  Subjective:    [name] is a 80 y.o. female who presents for evaluation of low back pain. The patient has had recurrent self limited episodes of low back pain in the past. Symptoms have been present for several weeks and are gradually worsening.  Onset was related to / precipitated by no known injury. The pain is located in the right lumbar area and does not radiate. The pain is described as aching and occurs all day. She rates her pain as severe. Symptoms are exacerbated by movement. Symptoms are improved by rest. She has also tried nothing which provided no symptom relief. She has no other symptoms associated with the back pain. The patient has no "red flag" history indicative of complicated back pain.  The following portions of the patient's history were reviewed and updated as appropriate: allergies, current medications, past family history, past medical history, past social history, past surgical history and problem list.  Review of Systems Pertinent items are noted in HPI.    Objective:   Normal reflexes, gait, strength and negative straight-leg raise. Inspection and palpation: paraspinal tenderness noted t10-12, antalgic gait.    Assessment:    mid back pain --- xray--- + compression fracture need mri   Plan:    Natural history and expected course discussed. Questions answered. Short (2-4 day) period of relative rest recommended until acute symptoms improve. Ice to affected area as needed for local pain relief. Heat to affected area as needed for local pain relief. MRI of the affected area due to presence of possible compression fracture on xray. Muscle relaxants per medication orders.

## 2013-08-31 NOTE — Patient Instructions (Signed)
Back Pain, Adult Low back pain is very common. About 1 in 5 people have back pain.The cause of low back pain is rarely dangerous. The pain often gets better over time.About half of people with a sudden onset of back pain feel better in just 2 weeks. About 8 in 10 people feel better by 6 weeks.  CAUSES Some common causes of back pain include:  Strain of the muscles or ligaments supporting the spine.  Wear and tear (degeneration) of the spinal discs.  Arthritis.  Direct injury to the back. DIAGNOSIS Most of the time, the direct cause of low back pain is not known.However, back pain can be treated effectively even when the exact cause of the pain is unknown.Answering your caregiver's questions about your overall health and symptoms is one of the most accurate ways to make sure the cause of your pain is not dangerous. If your caregiver needs more information, he or she may order lab work or imaging tests (X-rays or MRIs).However, even if imaging tests show changes in your back, this usually does not require surgery. HOME CARE INSTRUCTIONS For many people, back pain returns.Since low back pain is rarely dangerous, it is often a condition that people can learn to manageon their own.   Remain active. It is stressful on the back to sit or stand in one place. Do not sit, drive, or stand in one place for more than 30 minutes at a time. Take short walks on level surfaces as soon as pain allows.Try to increase the length of time you walk each day.  Do not stay in bed.Resting more than 1 or 2 days can delay your recovery.  Do not avoid exercise or work.Your body is made to move.It is not dangerous to be active, even though your back may hurt.Your back will likely heal faster if you return to being active before your pain is gone.  Pay attention to your body when you bend and lift. Many people have less discomfortwhen lifting if they bend their knees, keep the load close to their bodies,and  avoid twisting. Often, the most comfortable positions are those that put less stress on your recovering back.  Find a comfortable position to sleep. Use a firm mattress and lie on your side with your knees slightly bent. If you lie on your back, put a pillow under your knees.  Only take over-the-counter or prescription medicines as directed by your caregiver. Over-the-counter medicines to reduce pain and inflammation are often the most helpful.Your caregiver may prescribe muscle relaxant drugs.These medicines help dull your pain so you can more quickly return to your normal activities and healthy exercise.  Put ice on the injured area.  Put ice in a plastic bag.  Place a towel between your skin and the bag.  Leave the ice on for 15-20 minutes, 03-04 times a day for the first 2 to 3 days. After that, ice and heat may be alternated to reduce pain and spasms.  Ask your caregiver about trying back exercises and gentle massage. This may be of some benefit.  Avoid feeling anxious or stressed.Stress increases muscle tension and can worsen back pain.It is important to recognize when you are anxious or stressed and learn ways to manage it.Exercise is a great option. SEEK MEDICAL CARE IF:  You have pain that is not relieved with rest or medicine.  You have pain that does not improve in 1 week.  You have new symptoms.  You are generally not feeling well. SEEK   IMMEDIATE MEDICAL CARE IF:   You have pain that radiates from your back into your legs.  You develop new bowel or bladder control problems.  You have unusual weakness or numbness in your arms or legs.  You develop nausea or vomiting.  You develop abdominal pain.  You feel faint. Document Released: 03/04/2005 Document Revised: 09/03/2011 Document Reviewed: 07/23/2010 Exit[employer] Patient Information 2014 [employer].  

## 2013-08-31 NOTE — Telephone Encounter (Signed)
Discussed with patient and she voiced understanding. Order has been placed       KP

## 2013-09-04 ENCOUNTER — Ambulatory Visit (HOSPITAL_BASED_OUTPATIENT_CLINIC_OR_DEPARTMENT_OTHER)
Admission: RE | Admit: 2013-09-04 | Discharge: 2013-09-04 | Disposition: A | Discharge status: Home / Self Care | Payer: Medicare HMO | Source: Ambulatory Visit | Attending: Family Medicine | Admitting: Family Medicine

## 2013-09-04 DIAGNOSIS — Z981 Arthrodesis status: Secondary | ICD-10-CM | POA: Insufficient documentation

## 2013-09-04 DIAGNOSIS — M8448XA Pathological fracture, other site, initial encounter for fracture: Secondary | ICD-10-CM | POA: Insufficient documentation

## 2013-09-04 DIAGNOSIS — M4856XA Collapsed vertebra, not elsewhere classified, lumbar region, initial encounter for fracture: Secondary | ICD-10-CM

## 2013-09-08 ENCOUNTER — Other Ambulatory Visit: Payer: Self-pay

## 2013-09-08 DIAGNOSIS — M5136 Other intervertebral disc degeneration, lumbar region: Secondary | ICD-10-CM

## 2013-09-08 DIAGNOSIS — M48061 Spinal stenosis, lumbar region without neurogenic claudication: Secondary | ICD-10-CM

## 2013-09-08 DIAGNOSIS — M5126 Other intervertebral disc displacement, lumbar region: Secondary | ICD-10-CM

## 2013-10-24 ENCOUNTER — Other Ambulatory Visit: Payer: Self-pay | Admitting: Family Medicine

## 2013-11-23 ENCOUNTER — Other Ambulatory Visit: Payer: Self-pay | Admitting: Family Medicine

## 2013-11-30 ENCOUNTER — Other Ambulatory Visit: Payer: Self-pay

## 2013-11-30 DIAGNOSIS — Z1231 Encounter for screening mammogram for malignant neoplasm of breast: Secondary | ICD-10-CM

## 2013-12-09 ENCOUNTER — Ambulatory Visit
Admission: RE | Admit: 2013-12-09 | Discharge: 2013-12-09 | Disposition: A | Discharge status: Home / Self Care | Payer: Medicare HMO | Source: Ambulatory Visit

## 2013-12-09 DIAGNOSIS — Z1231 Encounter for screening mammogram for malignant neoplasm of breast: Secondary | ICD-10-CM

## 2013-12-21 ENCOUNTER — Other Ambulatory Visit: Payer: Self-pay | Admitting: Family Medicine

## 2013-12-30 ENCOUNTER — Encounter: Payer: Self-pay | Admitting: Family

## 2013-12-30 ENCOUNTER — Ambulatory Visit (INDEPENDENT_AMBULATORY_CARE_PROVIDER_SITE_OTHER): Payer: Medicare HMO | Admitting: Family

## 2013-12-30 VITALS — BP 130/90 | HR 90 | Temp 98.2°F | Resp 16 | Ht 65.0 in | Wt 286.8 lb

## 2013-12-30 DIAGNOSIS — M519 Unspecified thoracic, thoracolumbar and lumbosacral intervertebral disc disorder: Secondary | ICD-10-CM | POA: Diagnosis not present

## 2013-12-30 MED ORDER — OXYCODONE-ACETAMINOPHEN 5-325 MG PO TABS: 1.0000 | ORAL_TABLET | Freq: Three times a day (TID) | ORAL | Status: DC | PRN

## 2013-12-30 MED ORDER — METHYLPREDNISOLONE (PAK) 4 MG PO TABS: ORAL_TABLET | ORAL | Status: DC

## 2013-12-30 NOTE — Progress Notes (Signed)
Pre visit review using our clinic review tool, if applicable. No additional management support is needed unless otherwise documented below in the visit note. 

## 2013-12-30 NOTE — Progress Notes (Signed)
Subjective:    Patient ID: [name] GivenJoan M Macleod, female    DOB: 01/09/1934, 80 y.o.   MRN: [id number]  HPI:  [name] is a 80 y.o. female who presents today for back spasm. Her daughter is present for the visit with her permission.  Been going on for about 2 months. Was previously prescribed Zanaflex which does not work. Prefers not to have surgery. Previous MRI reveals multiple bulging discs ranging from mild to moderate with moderate to severe stenosis. Currently walks with a cane and lives with her daughter who is her caretaker. Previous back surgery performed by orthopedics and was set up an appointment with neurosurgery, but did not go.  Currently pain is located on only the right side of her back with no radiation to the lower extremity. The pain however has increased in intensity now only relieved with periods of sitting. Denies bowel or bladder changes.  Allergies  Allergen Reactions  . Codeine   . Gabapentin Swelling    Caused Bilateral feet swelling  . Latex     REACTION: Reaction not known  . Morphine Sulfate   . Penicillins   . Sertraline Hcl    Current Outpatient Prescriptions on File Prior to Visit  Medication Sig Dispense Refill  . amantadine (SYMMETREL) 100 MG capsule Take 1 capsule (100 mg total) by mouth 2 (two) times daily.  60 capsule  5  . amLODipine-valsartan (EXFORGE) 10-320 MG per tablet take 1 tablet by mouth once daily  30 tablet  5  . carbidopa-levodopa (SINEMET) 25-100 MG per tablet 2 tablets 4 (four) times daily.       Marland Kitchen. desoximetasone (TOPICORT) 0.05 % cream apply to affected area twice a day  30 g  2  . pravastatin (PRAVACHOL) 20 MG tablet take 1 tablet by mouth once daily *REPEAT LABS ARE DUE NOW  30 tablet  0  . tiZANidine (ZANAFLEX) 4 MG tablet Take 1 tablet (4 mg total) by mouth every 6 (six) hours as needed for muscle spasms.  30 tablet  0   No current facility-administered medications on file prior to visit.   Past Medical History  Diagnosis  Date  . Scoliosis   . Meniscus tear   . Depression   . Hypertension   . Osteoarthritis   . Myalgia   . Calf pain   . Hypokalemia   . Other symptoms referable to lower leg joint   . Back pain, thoracic   . Bruise   . Chronic pain syndrome   . Other urinary incontinence   . Edema   . Skin rash   . Gallstones   . Chest pain     -- Myoview negative 2007  . Benign familial tremor   . Allergic drug reaction     Opioids  . Tinea corporis     Review of Systems  See HPI    Objective:     BP 130/90  Pulse 90  Temp(Src) 98.2 F (36.8 C) (Oral)  Resp 16  Ht 5\' 5"  (1.651 m)  Wt 286 lb 12.8 oz (130.092 kg)  BMI 47.73 kg/m2  SpO2 94% Nursing note and vital signs reviewed.  Physical Exam  Constitutional: She is oriented to person, place, and time.  Obese female seated in the chair with a cane next to her, appears her stated age, is dressed appropriately and is in NAD.  Cardiovascular: Normal rate, regular rhythm and normal heart sounds.   Pulmonary/Chest: Effort normal and breath sounds normal.  Musculoskeletal:  No obvious deformity, discoloration or deformity of lumbar spine noted. Palpable tenderness right lateral to L1-L3. Sensation and pulses intact distally. Straight leg raise negative.  Neurological: She is alert and oriented to person, place, and time.  Skin: Skin is warm and dry.  Psychiatric: She has a normal mood and affect. Her behavior is normal. Judgment and thought content normal.        Assessment & Plan:

## 2013-12-30 NOTE — Assessment & Plan Note (Addendum)
Discussed the limitations of medicine in fixing the bulging discs. Will refer to neurosurgery. Start medrol dose pack. Start Percocet per patient request to be used as needed. Discussed allergy to codeine and morphine and patient indicated she has taken before with no problems.  Pt acknowledged plan and is in agreement.

## 2013-12-30 NOTE — Patient Instructions (Signed)
Thank you for choosing [facility].  Summary/Instructions:   Your referral has been made for Dr. Franky Machoabbell. You should be hearing from us in less than a week.  Please take the Prednisone per the instructions. Use Percocet only as needed for breakthrough pain.   Follow up with Dr. Laury AxonLowne as needed.

## 2014-01-20 ENCOUNTER — Other Ambulatory Visit: Payer: Self-pay | Admitting: Family Medicine

## 2014-02-20 ENCOUNTER — Other Ambulatory Visit: Payer: Self-pay | Admitting: Family Medicine

## 2014-03-21 ENCOUNTER — Other Ambulatory Visit: Payer: Self-pay | Admitting: Family Medicine

## 2014-04-19 ENCOUNTER — Telehealth: Payer: Self-pay | Admitting: Family Medicine

## 2014-04-19 NOTE — Telephone Encounter (Signed)
Pravastatin and exforge refills sent to pharmacy. Pt is past due for follow up with Dr Laury AxonLowne. Please call pt to arrange appt soon.

## 2014-04-20 NOTE — Telephone Encounter (Signed)
Mailbox full, will try again later

## 2014-04-21 NOTE — Telephone Encounter (Signed)
Appointment scheduled for 05/03/14

## 2014-04-21 NOTE — Telephone Encounter (Signed)
Left message with lady for patient to return my call.

## 2014-05-03 ENCOUNTER — Ambulatory Visit (INDEPENDENT_AMBULATORY_CARE_PROVIDER_SITE_OTHER): Payer: Medicare HMO | Admitting: Family Medicine

## 2014-05-03 ENCOUNTER — Encounter: Payer: Self-pay | Admitting: Family Medicine

## 2014-05-03 VITALS — BP 142/86 | HR 68 | Temp 99.3°F | Wt 288.2 lb

## 2014-05-03 DIAGNOSIS — Z23 Encounter for immunization: Secondary | ICD-10-CM

## 2014-05-03 DIAGNOSIS — E785 Hyperlipidemia, unspecified: Secondary | ICD-10-CM | POA: Insufficient documentation

## 2014-05-03 DIAGNOSIS — E8881 Metabolic syndrome: Secondary | ICD-10-CM

## 2014-05-03 DIAGNOSIS — G894 Chronic pain syndrome: Secondary | ICD-10-CM

## 2014-05-03 DIAGNOSIS — I10 Essential (primary) hypertension: Secondary | ICD-10-CM

## 2014-05-03 NOTE — Assessment & Plan Note (Signed)
Stable con't exforge

## 2014-05-03 NOTE — Assessment & Plan Note (Signed)
Per Ns--  Pain management

## 2014-05-03 NOTE — Progress Notes (Signed)
Pre visit review using our clinic review tool, if applicable. No additional management support is needed unless otherwise documented below in the visit note. 

## 2014-05-03 NOTE — Patient Instructions (Signed)

## 2014-05-03 NOTE — Progress Notes (Signed)
Subjective:    Patient ID: [name], female    DOB: Jul 18, 1933, 81 y.o.   MRN: [id number]  HPI  Patient here for f/u back pain--she is seeing pain management,  htn and cholesterol.    Past Medical History  Diagnosis Date  . Scoliosis   . Meniscus tear   . Depression   . Hypertension   . Osteoarthritis   . Myalgia   . Calf pain   . Hypokalemia   . Other symptoms referable to lower leg joint   . Back pain, thoracic   . Bruise   . Chronic pain syndrome   . Other urinary incontinence   . Edema   . Skin rash   . Gallstones   . Chest pain     -- Myoview negative 2007  . Benign familial tremor   . Allergic drug reaction     Opioids  . Tinea corporis    Current Outpatient Prescriptions  Medication Sig Dispense Refill  . amLODipine-valsartan (EXFORGE) 10-320 MG per tablet take 1 tablet by mouth once daily 30 tablet 2  . carbidopa-levodopa (SINEMET) 25-100 MG per tablet 2 tablets 4 (four) times daily.     Marland Kitchen desoximetasone (TOPICORT) 0.05 % cream apply to affected area twice a day 30 g 2  . OLANZapine-FLUoxetine (SYMBYAX) 12-50 MG per capsule take 1 capsule by mouth every evening 30 capsule 1  . pravastatin (PRAVACHOL) 20 MG tablet take 1 tablet by mouth once daily OFFICE VISIT WITH FASTING LABS DUE NOW 30 tablet 2  . amantadine (SYMMETREL) 100 MG capsule Take 1 capsule (100 mg total) by mouth 2 (two) times daily. (Patient not taking: Reported on 05/03/2014) 60 capsule 5   No current facility-administered medications for this visit.     Review of Systems  Constitutional: Negative for activity change, appetite change, fatigue and unexpected weight change.  Respiratory: Negative for cough and shortness of breath.   Cardiovascular: Negative for chest pain and palpitations.  Psychiatric/Behavioral: Negative for behavioral problems and dysphoric mood. The patient is not nervous/anxious.        Objective:    Physical Exam  Constitutional: She is oriented to person,  place, and time. She appears well-developed and well-nourished. No distress.  HENT:  Right Ear: External ear normal.  Left Ear: External ear normal.  Nose: Nose normal.  Mouth/Throat: Oropharynx is clear and moist.  Eyes: EOM are normal. Pupils are equal, round, and reactive to light.  Neck: Normal range of motion. Neck supple.  Cardiovascular: Normal rate, regular rhythm and normal heart sounds.   No murmur heard. Pulmonary/Chest: Effort normal and breath sounds normal. No respiratory distress. She has no wheezes. She has no rales. She exhibits no tenderness.  Musculoskeletal: She exhibits edema.  Neurological: She is alert and oriented to person, place, and time.  Psychiatric: She has a normal mood and affect. Her behavior is normal. Judgment and thought content normal.    BP 142/86 mmHg  Pulse 68  Temp(Src) 99.3 F (37.4 C) (Oral)  Wt 288 lb 3.2 oz (130.727 kg)  SpO2 95% Wt Readings from Last 3 Encounters:  05/03/14 288 lb 3.2 oz (130.727 kg)  12/30/13 286 lb 12.8 oz (130.092 kg)  08/31/13 278 lb (126.1 kg)       Mm Digital Screening Bilateral  12/09/2013   CLINICAL DATA:  Screening.  EXAM: DIGITAL SCREENING BILATERAL MAMMOGRAM WITH CAD  COMPARISON:  Previous exam(s).  ACR Breast Density Category b: There are scattered areas of fibroglandular  density.  FINDINGS: There are no findings suspicious for malignancy. Images were processed with CAD.  IMPRESSION: No mammographic evidence of malignancy. A result letter of this screening mammogram will be mailed directly to the patient.  RECOMMENDATION: Screening mammogram in one year. (Code:SM-B-01Y)  BI-RADS CATEGORY  1: Negative.   Electronically Signed   By: Britta MccreedySusan  Turner M.D.   On: 12/09/2013 17:26       Assessment & Plan:   Problem List Items Addressed This Visit    Hyperlipidemia   Relevant Orders   Basic metabolic panel   CBC with Differential/Platelet   Hemoglobin A1c   Hepatic function panel   Lipid panel    Microalbumin / creatinine urine ratio   POCT urinalysis dipstick   Essential hypertension - Primary    Stable con't exforge      Relevant Orders   Basic metabolic panel   CBC with Differential/Platelet   Hemoglobin A1c   Hepatic function panel   Lipid panel   Microalbumin / creatinine urine ratio   POCT urinalysis dipstick   CHRONIC PAIN SYNDROME    Per Ns--  Pain management       Other Visit Diagnoses    Metabolic syndrome        Relevant Orders    Basic metabolic panel    CBC with Differential/Platelet    Hemoglobin A1c    Hepatic function panel    Lipid panel    Microalbumin / creatinine urine ratio    POCT urinalysis dipstick    Need for pneumococcal vaccination        Relevant Orders    Pneumococcal conjugate vaccine 13-valent (Completed)        Loreen FreudYvonne Lowne, DO

## 2014-05-03 NOTE — Assessment & Plan Note (Signed)
Check labs con't pravastatin 

## 2014-05-11 ENCOUNTER — Other Ambulatory Visit (INDEPENDENT_AMBULATORY_CARE_PROVIDER_SITE_OTHER): Payer: Medicare HMO

## 2014-05-11 DIAGNOSIS — I10 Essential (primary) hypertension: Secondary | ICD-10-CM

## 2014-05-11 DIAGNOSIS — E8881 Metabolic syndrome: Secondary | ICD-10-CM

## 2014-05-11 DIAGNOSIS — E785 Hyperlipidemia, unspecified: Secondary | ICD-10-CM

## 2014-05-11 LAB — LIPID PANEL
CHOL/HDL RATIO: 3
Cholesterol: 154 mg/dL (ref 0–200)
HDL: 55 mg/dL (ref >39.00)
LDL Cholesterol: 86 mg/dL (ref 0–99)
NONHDL: 99
Triglycerides: 66 mg/dL (ref 0.0–149.0)
VLDL: 13.2 mg/dL (ref 0.0–40.0)

## 2014-05-11 LAB — BASIC METABOLIC PANEL
BUN: 21 mg/dL (ref 6–23)
CALCIUM: 9.3 mg/dL (ref 8.4–10.5)
CO2: 29 mEq/L (ref 19–32)
Chloride: 106 mEq/L (ref 96–112)
Creatinine, Ser: 0.86 mg/dL (ref 0.40–1.20)
GFR: 82.03 mL/min (ref >60.00)
Glucose, Bld: 96 mg/dL (ref 70–99)
Potassium: 4.2 mEq/L (ref 3.5–5.1)
SODIUM: 139 meq/L (ref 135–145)

## 2014-05-11 LAB — HEPATIC FUNCTION PANEL
ALT: 16 U/L (ref 0–35)
AST: 13 U/L (ref 0–37)
Albumin: 4 g/dL (ref 3.5–5.2)
Alkaline Phosphatase: 69 U/L (ref 39–117)
BILIRUBIN DIRECT: 0.1 mg/dL (ref 0.0–0.3)
BILIRUBIN TOTAL: 0.4 mg/dL (ref 0.2–1.2)
Total Protein: 7.1 g/dL (ref 6.0–8.3)

## 2014-05-11 LAB — CBC WITH DIFFERENTIAL/PLATELET
Basophils Absolute: 0 10*3/uL (ref 0.0–0.1)
Basophils Relative: 0.6 % (ref 0.0–3.0)
Eosinophils Absolute: 0.2 10*3/uL (ref 0.0–0.7)
Eosinophils Relative: 4.5 % (ref 0.0–5.0)
HCT: 37.3 % (ref 36.0–46.0)
HEMOGLOBIN: 12.5 g/dL (ref 12.0–15.0)
LYMPHS PCT: 35.4 % (ref 12.0–46.0)
Lymphs Abs: 1.9 10*3/uL (ref 0.7–4.0)
MCHC: 33.5 g/dL (ref 30.0–36.0)
MCV: 87.1 fl (ref 78.0–100.0)
MONOS PCT: 8.4 % (ref 3.0–12.0)
Monocytes Absolute: 0.5 10*3/uL (ref 0.1–1.0)
Neutro Abs: 2.8 10*3/uL (ref 1.4–7.7)
Neutrophils Relative %: 51.1 % (ref 43.0–77.0)
Platelets: 316 10*3/uL (ref 150.0–400.0)
RBC: 4.28 Mil/uL (ref 3.87–5.11)
RDW: 14.7 % (ref 11.5–15.5)
WBC: 5.5 10*3/uL (ref 4.0–10.5)

## 2014-05-11 LAB — MICROALBUMIN / CREATININE URINE RATIO
Creatinine,U: 129.5 mg/dL
Microalb Creat Ratio: 0.6 mg/g (ref 0.0–30.0)
Microalb, Ur: 0.8 mg/dL (ref 0.0–1.9)

## 2014-05-11 LAB — HEMOGLOBIN A1C: Hgb A1c MFr Bld: 6.2 % (ref 4.6–6.5)

## 2014-05-17 ENCOUNTER — Other Ambulatory Visit: Payer: Self-pay | Admitting: Family Medicine

## 2014-07-19 ENCOUNTER — Other Ambulatory Visit: Payer: Self-pay | Admitting: Family Medicine

## 2014-09-08 ENCOUNTER — Other Ambulatory Visit: Payer: Self-pay | Admitting: Family Medicine

## 2014-11-16 ENCOUNTER — Other Ambulatory Visit: Payer: Self-pay | Admitting: Family Medicine

## 2015-01-16 ENCOUNTER — Other Ambulatory Visit: Payer: Self-pay | Admitting: Family Medicine

## 2015-01-18 ENCOUNTER — Ambulatory Visit (INDEPENDENT_AMBULATORY_CARE_PROVIDER_SITE_OTHER): Payer: Medicare PPO | Admitting: Behavioral Health

## 2015-01-18 DIAGNOSIS — Z23 Encounter for immunization: Secondary | ICD-10-CM

## 2015-01-18 NOTE — Progress Notes (Signed)
Pre visit review using our clinic review tool, if applicable. No additional management support is needed unless otherwise documented below in the visit note. 

## 2015-01-23 ENCOUNTER — Other Ambulatory Visit: Payer: Self-pay

## 2015-01-23 DIAGNOSIS — Z1231 Encounter for screening mammogram for malignant neoplasm of breast: Secondary | ICD-10-CM

## 2015-02-14 ENCOUNTER — Ambulatory Visit
Admission: RE | Admit: 2015-02-14 | Discharge: 2015-02-14 | Disposition: A | Discharge status: Home / Self Care | Payer: Medicare PPO | Source: Ambulatory Visit

## 2015-02-14 DIAGNOSIS — Z1231 Encounter for screening mammogram for malignant neoplasm of breast: Secondary | ICD-10-CM

## 2015-02-15 ENCOUNTER — Other Ambulatory Visit: Payer: Self-pay | Admitting: Family Medicine

## 2015-02-15 NOTE — Telephone Encounter (Signed)
Medication filled to pharmacy as requested.   

## 2015-03-16 ENCOUNTER — Other Ambulatory Visit: Payer: Self-pay | Admitting: Family Medicine

## 2015-04-19 ENCOUNTER — Telehealth: Payer: Self-pay | Admitting: Family Medicine

## 2015-04-19 NOTE — Telephone Encounter (Signed)
[id number] - [place], [place] - [address]  Pt states her pharmacy has contacted [place] twice since Monday. Pt is out of OLANZapine-FLUoxetine.

## 2015-04-20 MED ORDER — OLANZAPINE-FLUOXETINE HCL 12-50 MG PO CAPS: 1.0000 | ORAL_CAPSULE | Freq: Every evening | ORAL | Status: DC

## 2015-04-20 NOTE — Telephone Encounter (Signed)
RX faxed however she is due for an apt. Please schedule/Fasting.    KP

## 2015-04-20 NOTE — Telephone Encounter (Signed)
Phone # won't accept unidentified calls, cell # VM not setup - sent MC msg to schedule appt

## 2015-05-17 ENCOUNTER — Other Ambulatory Visit: Payer: Self-pay

## 2015-05-17 MED ORDER — OLANZAPINE-FLUOXETINE HCL 12-50 MG PO CAPS: 1.0000 | ORAL_CAPSULE | Freq: Every evening | ORAL | Status: DC

## 2015-07-13 ENCOUNTER — Other Ambulatory Visit: Payer: Self-pay | Admitting: Family Medicine

## 2015-07-14 NOTE — Telephone Encounter (Signed)
30 day supply sent to pharmacy. Pt has not been seen since 04/2014 and is past due for follow up.  Please call pt to schedule appt now as we will be unable to provide further refills until pt is seen. Thanks!

## 2015-07-19 ENCOUNTER — Other Ambulatory Visit: Payer: Self-pay

## 2015-07-19 MED ORDER — PRAVASTATIN SODIUM 20 MG PO TABS: 20.0000 mg | ORAL_TABLET | Freq: Every day | ORAL | Status: DC

## 2015-07-19 MED ORDER — AMLODIPINE BESYLATE-VALSARTAN 10-320 MG PO TABS: 1.0000 | ORAL_TABLET | Freq: Every day | ORAL | Status: DC

## 2015-07-19 NOTE — Telephone Encounter (Signed)
I have tried to call the patient to schedule an Office visit and there was not answer or VM to leave a message. 30 days sent and a letter has been mailed.    KP

## 2015-08-13 ENCOUNTER — Other Ambulatory Visit: Payer: Self-pay | Admitting: Family Medicine

## 2015-08-16 ENCOUNTER — Other Ambulatory Visit: Payer: Self-pay

## 2015-08-16 MED ORDER — AMLODIPINE BESYLATE-VALSARTAN 10-320 MG PO TABS: 1.0000 | ORAL_TABLET | Freq: Every day | ORAL | Status: DC

## 2015-08-18 ENCOUNTER — Encounter: Payer: Self-pay | Admitting: Family Medicine

## 2015-08-18 ENCOUNTER — Ambulatory Visit (INDEPENDENT_AMBULATORY_CARE_PROVIDER_SITE_OTHER): Payer: Medicare Other | Admitting: Family Medicine

## 2015-08-18 VITALS — BP 134/62 | HR 70 | Temp 98.5°F | Ht 64.0 in | Wt 272.0 lb

## 2015-08-18 DIAGNOSIS — I10 Essential (primary) hypertension: Secondary | ICD-10-CM | POA: Diagnosis not present

## 2015-08-18 DIAGNOSIS — M25512 Pain in left shoulder: Secondary | ICD-10-CM | POA: Diagnosis not present

## 2015-08-18 DIAGNOSIS — F329 Major depressive disorder, single episode, unspecified: Secondary | ICD-10-CM | POA: Diagnosis not present

## 2015-08-18 DIAGNOSIS — F32A Depression, unspecified: Secondary | ICD-10-CM

## 2015-08-18 DIAGNOSIS — E785 Hyperlipidemia, unspecified: Secondary | ICD-10-CM

## 2015-08-18 LAB — POCT URINALYSIS DIPSTICK
BILIRUBIN UA: NEGATIVE
GLUCOSE UA: NEGATIVE
Ketones, UA: NEGATIVE
LEUKOCYTES UA: NEGATIVE
NITRITE UA: NEGATIVE
Protein, UA: NEGATIVE
RBC UA: NEGATIVE
Spec Grav, UA: >=1.03
UROBILINOGEN UA: 0.2
pH, UA: 5.5

## 2015-08-18 LAB — CBC WITH DIFFERENTIAL/PLATELET
BASOS ABS: 0 10*3/uL (ref 0.0–0.1)
Basophils Relative: 0.4 % (ref 0.0–3.0)
EOS ABS: 0.3 10*3/uL (ref 0.0–0.7)
Eosinophils Relative: 5.2 % — ABNORMAL HIGH (ref 0.0–5.0)
HEMATOCRIT: 38.1 % (ref 36.0–46.0)
Hemoglobin: 12.5 g/dL (ref 12.0–15.0)
LYMPHS PCT: 32.2 % (ref 12.0–46.0)
Lymphs Abs: 1.7 10*3/uL (ref 0.7–4.0)
MCHC: 32.8 g/dL (ref 30.0–36.0)
MCV: 85.4 fl (ref 78.0–100.0)
MONO ABS: 0.6 10*3/uL (ref 0.1–1.0)
Monocytes Relative: 10.4 % (ref 3.0–12.0)
NEUTROS ABS: 2.8 10*3/uL (ref 1.4–7.7)
Neutrophils Relative %: 51.8 % (ref 43.0–77.0)
Platelets: 354 10*3/uL (ref 150.0–400.0)
RBC: 4.47 Mil/uL (ref 3.87–5.11)
RDW: 14.9 % (ref 11.5–15.5)
WBC: 5.3 10*3/uL (ref 4.0–10.5)

## 2015-08-18 LAB — COMPREHENSIVE METABOLIC PANEL
ALBUMIN: 4.1 g/dL (ref 3.5–5.2)
ALT: 1 U/L (ref 0–35)
AST: 8 U/L (ref 0–37)
Alkaline Phosphatase: 101 U/L (ref 39–117)
BILIRUBIN TOTAL: 0.4 mg/dL (ref 0.2–1.2)
BUN: 22 mg/dL (ref 6–23)
CALCIUM: 9.2 mg/dL (ref 8.4–10.5)
CHLORIDE: 106 meq/L (ref 96–112)
CO2: 28 mEq/L (ref 19–32)
CREATININE: 0.98 mg/dL (ref 0.40–1.20)
GFR: 70.32 mL/min (ref >60.00)
Glucose, Bld: 107 mg/dL — ABNORMAL HIGH (ref 70–99)
Potassium: 4 mEq/L (ref 3.5–5.1)
SODIUM: 142 meq/L (ref 135–145)
TOTAL PROTEIN: 7.2 g/dL (ref 6.0–8.3)

## 2015-08-18 LAB — LIPID PANEL
CHOL/HDL RATIO: 4
CHOLESTEROL: 143 mg/dL (ref 0–200)
HDL: 39.1 mg/dL (ref >39.00)
LDL Cholesterol: 82 mg/dL (ref 0–99)
NonHDL: 104.01
TRIGLYCERIDES: 110 mg/dL (ref 0.0–149.0)
VLDL: 22 mg/dL (ref 0.0–40.0)

## 2015-08-18 MED ORDER — PRAVASTATIN SODIUM 20 MG PO TABS: 20.0000 mg | ORAL_TABLET | Freq: Every day | ORAL | Status: DC

## 2015-08-18 MED ORDER — DICLOFENAC SODIUM 1 % TD GEL: 2.0000 g | Freq: Four times a day (QID) | TRANSDERMAL | Status: DC

## 2015-08-18 MED ORDER — AMLODIPINE BESYLATE-VALSARTAN 10-320 MG PO TABS: 1.0000 | ORAL_TABLET | Freq: Every day | ORAL | Status: DC

## 2015-08-18 MED ORDER — OLANZAPINE-FLUOXETINE HCL 12-50 MG PO CAPS: ORAL_CAPSULE | ORAL | Status: DC

## 2015-08-18 NOTE — Progress Notes (Signed)
Pre visit review using our clinic review tool, if applicable. No additional management support is needed unless otherwise documented below in the visit note. 

## 2015-08-18 NOTE — Patient Instructions (Signed)

## 2015-08-18 NOTE — Progress Notes (Signed)
Patient ID: [name], female    DOB: 09/01/1933  Age: 82 y.o. MRN: [id number]    Subjective:  Subjective HPI [name] GivenJoan M [name] presents for f/u bp and cholesterol.  She is also c/o L shoulder pain -- no known injury No chest pain or sob  Her daughter is with her.   Review of Systems  Constitutional: Negative for diaphoresis, appetite change, fatigue and unexpected weight change.  Eyes: Negative for pain, redness and visual disturbance.  Respiratory: Negative for cough, chest tightness, shortness of breath and wheezing.   Cardiovascular: Negative for chest pain, palpitations and leg swelling.  Endocrine: Negative for cold intolerance, heat intolerance, polydipsia, polyphagia and polyuria.  Genitourinary: Negative for dysuria, frequency and difficulty urinating.  Musculoskeletal: Positive for arthralgias.  Neurological: Negative for dizziness, light-headedness, numbness and headaches.    History Past Medical History  Diagnosis Date  . Scoliosis   . Meniscus tear   . Depression   . Hypertension   . Osteoarthritis   . Myalgia   . Calf pain   . Hypokalemia   . Other symptoms referable to lower leg joint   . Back pain, thoracic   . Bruise   . Chronic pain syndrome   . Other urinary incontinence   . Edema   . Skin rash   . Gallstones   . Chest pain     -- Myoview negative 2007  . Benign familial tremor   . Allergic drug reaction     Opioids  . Tinea corporis     She has past surgical history that includes Abdominal hysterectomy; Rotator cuff repair; Lumbar laminectomy/decompression microdiscectomy (2007); Knee arthroscopy; and Breast lumpectomy.   Her family history includes Parkinsonism in her mother; Stroke in her mother.She reports that she has never smoked. She has never used smokeless tobacco. She reports that she does not drink alcohol or use illicit drugs.  Current Outpatient Prescriptions on File Prior to Visit  Medication Sig Dispense Refill  . amantadine  (SYMMETREL) 100 MG capsule Take 1 capsule (100 mg total) by mouth 2 (two) times daily. 60 capsule 5  . carbidopa-levodopa (SINEMET) 25-100 MG per tablet Take 5 tablets by mouth 4 (four) times daily.     Marland Kitchen. desoximetasone (TOPICORT) 0.05 % cream apply to affected area twice a day 60 g 1   No current facility-administered medications on file prior to visit.     Objective:  Objective Physical Exam  Constitutional: She is oriented to person, place, and time. She appears well-developed and well-nourished.  HENT:  Head: Normocephalic and atraumatic.  Eyes: Conjunctivae and EOM are normal.  Neck: Normal range of motion. Neck supple. No JVD present. Carotid bruit is not present. No thyromegaly present.  Cardiovascular: Normal rate, regular rhythm and normal heart sounds.   No murmur heard. Pulmonary/Chest: Effort normal and breath sounds normal. No respiratory distress. She has no wheezes. She has no rales. She exhibits no tenderness.  Musculoskeletal: She exhibits tenderness. She exhibits no edema.       Left shoulder: She exhibits decreased range of motion, tenderness and pain.  Neurological: She is alert and oriented to person, place, and time.  Psychiatric: She has a normal mood and affect.  Nursing note and vitals reviewed.  BP 134/62 mmHg  Pulse 70  Temp(Src) 98.5 F (36.9 C) (Oral)  Ht 5\' 4"  (1.626 m)  Wt 272 lb (123.378 kg)  BMI 46.67 kg/m2  SpO2 97% Wt Readings from Last 3 Encounters:  08/18/15 272 lb (123.378  kg)  05/03/14 288 lb 3.2 oz (130.727 kg)  12/30/13 286 lb 12.8 oz (130.092 kg)       Mm Digital Screening Bilateral  02/14/2015  CLINICAL DATA:  Screening. EXAM: DIGITAL SCREENING BILATERAL MAMMOGRAM WITH CAD COMPARISON:  Previous exam(s). ACR Breast Density Category b: There are scattered areas of fibroglandular density. FINDINGS: There are no findings suspicious for malignancy. Images were processed with CAD. IMPRESSION: No mammographic evidence of malignancy. A  result letter of this screening mammogram will be mailed directly to the patient. RECOMMENDATION: Screening mammogram in one year. (Code:SM-B-01Y) BI-RADS CATEGORY  1: Negative. Electronically Signed   By: Amie Portland M.D.   On: 02/14/2015 15:59     Assessment & Plan:  Plan I have changed Ms. Gudino's amLODipine-valsartan and OLANZapine-FLUoxetine. I am also having her start on diclofenac sodium. Additionally, I am having her maintain her carbidopa-levodopa, amantadine, desoximetasone, and pravastatin.  Meds ordered this encounter  Medications  . amLODipine-valsartan (EXFORGE) 10-320 MG tablet    Sig: Take 1 tablet by mouth daily.    Dispense:  90 tablet    Refill:  1  . OLANZapine-FLUoxetine (SYMBYAX) 12-50 MG capsule    Sig: take 1 capsule by mouth every evening    Dispense:  90 capsule    Refill:  1  . pravastatin (PRAVACHOL) 20 MG tablet    Sig: Take 1 tablet (20 mg total) by mouth daily. Office visit due now    Dispense:  90 tablet    Refill:  1  . diclofenac sodium (VOLTAREN) 1 % GEL    Sig: Apply 2 g topically 4 (four) times daily.    Dispense:  100 g    Refill:  1    Problem List Items Addressed This Visit    Depression   Relevant Medications   OLANZapine-FLUoxetine (SYMBYAX) 12-50 MG capsule   Essential hypertension - Primary   Relevant Medications   amLODipine-valsartan (EXFORGE) 10-320 MG tablet   pravastatin (PRAVACHOL) 20 MG tablet   Other Relevant Orders   CBC with Differential/Platelet (Completed)   Lipid panel (Completed)   POCT urinalysis dipstick (Completed)   Comprehensive metabolic panel (Completed)   Hyperlipidemia   Relevant Medications   amLODipine-valsartan (EXFORGE) 10-320 MG tablet   pravastatin (PRAVACHOL) 20 MG tablet   Other Relevant Orders   CBC with Differential/Platelet (Completed)   Lipid panel (Completed)   POCT urinalysis dipstick (Completed)   Comprehensive metabolic panel (Completed)    Other Visit Diagnoses    Left shoulder  pain        Relevant Medications    diclofenac sodium (VOLTAREN) 1 % GEL       Follow-up: Return in about 6 months (around 02/17/2016), or if symptoms worsen or fail to improve, for annual exam, fasting.  Donato Schultz, DO

## 2015-08-25 ENCOUNTER — Telehealth: Payer: Self-pay | Admitting: *Deleted

## 2015-08-25 NOTE — Telephone Encounter (Signed)
PA initiated on [email or web address], awaiting determination. JG//CMA 

## 2015-08-28 NOTE — Telephone Encounter (Signed)
PA approved through 03/17/2016. Approval letter from [employer] sent for scanning. JG//CMA

## 2015-08-29 ENCOUNTER — Other Ambulatory Visit: Payer: Self-pay | Admitting: Family Medicine

## 2015-09-12 ENCOUNTER — Telehealth: Payer: Self-pay

## 2015-09-12 NOTE — Telephone Encounter (Signed)
Handicapped Placard form mailed to the patient.     KP

## 2016-01-08 ENCOUNTER — Other Ambulatory Visit: Payer: Self-pay | Admitting: Family Medicine

## 2016-01-08 DIAGNOSIS — Z1231 Encounter for screening mammogram for malignant neoplasm of breast: Secondary | ICD-10-CM

## 2016-01-12 ENCOUNTER — Encounter: Payer: Self-pay | Admitting: Family Medicine

## 2016-01-12 ENCOUNTER — Ambulatory Visit (INDEPENDENT_AMBULATORY_CARE_PROVIDER_SITE_OTHER): Payer: Medicare Other | Admitting: Family Medicine

## 2016-01-12 ENCOUNTER — Ambulatory Visit (HOSPITAL_BASED_OUTPATIENT_CLINIC_OR_DEPARTMENT_OTHER)
Admission: RE | Admit: 2016-01-12 | Discharge: 2016-01-12 | Disposition: A | Discharge status: Home / Self Care | Payer: Medicare Other | Source: Ambulatory Visit | Attending: Family Medicine | Admitting: Family Medicine

## 2016-01-12 VITALS — BP 132/68 | HR 66 | Temp 98.1°F | Resp 17 | Ht 64.0 in | Wt 273.0 lb

## 2016-01-12 DIAGNOSIS — M25512 Pain in left shoulder: Secondary | ICD-10-CM

## 2016-01-12 DIAGNOSIS — M19012 Primary osteoarthritis, left shoulder: Secondary | ICD-10-CM | POA: Insufficient documentation

## 2016-01-12 DIAGNOSIS — Z23 Encounter for immunization: Secondary | ICD-10-CM | POA: Diagnosis not present

## 2016-01-12 NOTE — Patient Instructions (Signed)

## 2016-01-12 NOTE — Progress Notes (Signed)
Pre visit review using our clinic review tool, if applicable. No additional management support is needed unless otherwise documented below in the visit note. 

## 2016-01-13 NOTE — Progress Notes (Signed)
Patient ID: [name], female    DOB: 05/04/1933  Age: 82 y.o. MRN: [id number]    Subjective:  Subjective  HPI [name] presents for c/o back and shoulder blade pain.  No chest pain.  No known injury  Review of Systems  Constitutional: Negative for appetite change, diaphoresis, fatigue and unexpected weight change.  Eyes: Negative for pain, redness and visual disturbance.  Respiratory: Negative for cough, chest tightness, shortness of breath and wheezing.   Cardiovascular: Negative for chest pain, palpitations and leg swelling.  Endocrine: Negative for cold intolerance, heat intolerance, polydipsia, polyphagia and polyuria.  Genitourinary: Negative for difficulty urinating, dysuria and frequency.  Neurological: Negative for dizziness, light-headedness, numbness and headaches.    History Past Medical History:  Diagnosis Date  . Allergic drug reaction    Opioids  . Back pain, thoracic   . Benign familial tremor   . Bruise   . Calf pain   . Chest pain    -- Myoview negative 2007  . Chronic pain syndrome   . Depression   . Edema   . Gallstones   . Hypertension   . Hypokalemia   . Meniscus tear   . Myalgia   . Osteoarthritis   . Other symptoms referable to lower leg joint   . Other urinary incontinence   . Scoliosis   . Skin rash   . Tinea corporis     She has a past surgical history that includes Abdominal hysterectomy; Rotator cuff repair; Lumbar laminectomy/decompression microdiscectomy (2007); Knee arthroscopy; and Breast lumpectomy.   Her family history includes Parkinsonism in her mother; Stroke in her mother.She reports that she has never smoked. She has never used smokeless tobacco. She reports that she does not drink alcohol or use drugs.  Current Outpatient Prescriptions on File Prior to Visit  Medication Sig Dispense Refill  . amantadine (SYMMETREL) 100 MG capsule Take 1 capsule (100 mg total) by mouth 2 (two) times daily. 60 capsule 5  .  amLODipine-valsartan (EXFORGE) 10-320 MG tablet Take 1 tablet by mouth daily. 90 tablet 1  . carbidopa-levodopa (SINEMET) 25-100 MG per tablet Take 5 tablets by mouth 4 (four) times daily.     Marland Kitchen. desoximetasone (TOPICORT) 0.05 % cream apply to affected area twice a day 60 g 1  . OLANZapine-FLUoxetine (SYMBYAX) 12-50 MG capsule take 1 capsule by mouth every evening 90 capsule 1  . pravastatin (PRAVACHOL) 20 MG tablet Take 1 tablet (20 mg total) by mouth daily. Office visit due now 90 tablet 1   No current facility-administered medications on file prior to visit.      Objective:  Objective  Physical Exam  Constitutional: She is oriented to person, place, and time. She appears well-developed and well-nourished.  HENT:  Head: Normocephalic and atraumatic.  Eyes: Conjunctivae and EOM are normal.  Neck: Normal range of motion. Neck supple. No JVD present. Carotid bruit is not present. No thyromegaly present.  Cardiovascular: Normal rate, regular rhythm and normal heart sounds.   No murmur heard. Pulmonary/Chest: Effort normal and breath sounds normal. No respiratory distress. She has no wheezes. She has no rales. She exhibits no tenderness.  Musculoskeletal: She exhibits tenderness. She exhibits no edema.       Left shoulder: She exhibits decreased range of motion, tenderness and pain.       Arms: Neurological: She is alert and oriented to person, place, and time.  Psychiatric: She has a normal mood and affect.  Nursing note and vitals reviewed.  BP 132/68 (BP Location: Right Arm, Patient Position: Sitting, Cuff Size: Large)   Pulse 66   Temp 98.1 F (36.7 C) (Oral)   Resp 17   Ht 5\' 4"  (1.626 m)   Wt 273 lb (123.8 kg)   SpO2 98%   BMI 46.86 kg/m  Wt Readings from Last 3 Encounters:  01/12/16 273 lb (123.8 kg)  08/18/15 272 lb (123.4 kg)  05/03/14 288 lb 3.2 oz (130.7 kg)       Dg Shoulder Left  Result Date: 01/12/2016 CLINICAL DATA:  Pt with left shoulder pain with painful  limited ROM, chronicNo prior sx, had rotator cuff repair to right shoulder EXAM: LEFT SHOULDER - 2+ VIEW COMPARISON:  None. FINDINGS: No fracture or bone lesion.  No dislocation. Mild spurring is noted at the [facility]. AC joint is normally spaced and aligned. Mild spurring is noted from the inferior glenoid and inferior humeral head. There are small ossified densities, 1 of which projects along the inferior left humeral head and the other anterior to the left glenoid. Bones are demineralized. Soft tissues are unremarkable. IMPRESSION: 1. No fracture or acute finding. 2. Mild glenohumeral joint arthropathic changes. Possible intra-articular ossified bodies. 3. Mild AC joint osteoarthritis. Electronically Signed   By: Amie Portlandavid  Ormond M.D.   On: 01/12/2016 13:57     Assessment & Plan:  Plan  I have discontinued [name]'s diclofenac sodium. I am also having her maintain her carbidopa-levodopa, amantadine, desoximetasone, amLODipine-valsartan, OLANZapine-FLUoxetine, and pravastatin.  No orders of the defined types were placed in this encounter.   Problem List Items Addressed This Visit    None    Visit Diagnoses    Left shoulder pain, unspecified chronicity    -  Primary   Relevant Orders   Ambulatory referral to Sports Medicine   DG Shoulder Left (Completed)   Encounter for immunization       Relevant Orders   Flu vaccine HIGH DOSE PF (Completed)   Ambulatory referral to Sports Medicine      Follow-up: No Follow-up on file.  Donato SchultzYvonne R Lowne Chase, DO

## 2016-01-14 ENCOUNTER — Other Ambulatory Visit: Payer: Self-pay | Admitting: Family Medicine

## 2016-01-16 NOTE — Telephone Encounter (Signed)
Pt requesting refill of topicort. Last rx was 08/2014. Is it ok to send refills?

## 2016-01-17 ENCOUNTER — Encounter: Payer: Self-pay | Admitting: Family Medicine

## 2016-01-17 ENCOUNTER — Ambulatory Visit (INDEPENDENT_AMBULATORY_CARE_PROVIDER_SITE_OTHER): Payer: Medicare Other | Admitting: Family Medicine

## 2016-01-17 ENCOUNTER — Ambulatory Visit: Payer: Medicare Other | Admitting: Family Medicine

## 2016-01-17 VITALS — BP 170/84 | HR 80 | Ht 66.0 in | Wt 270.0 lb

## 2016-01-17 DIAGNOSIS — G8929 Other chronic pain: Secondary | ICD-10-CM

## 2016-01-17 DIAGNOSIS — M25512 Pain in left shoulder: Secondary | ICD-10-CM | POA: Diagnosis not present

## 2016-01-17 MED ORDER — METHYLPREDNISOLONE ACETATE 40 MG/ML IJ SUSP
40.0000 mg | Freq: Once | INTRAMUSCULAR | Status: AC
  Administered 2016-01-17: 40 mg via INTRA_ARTICULAR

## 2016-01-17 NOTE — Patient Instructions (Signed)
Your shoulder pain is due to a combination of arthritis and rotator cuff impingement. Try to avoid painful activities (overhead activities, lifting with extended arm) as much as possible. These are the different medications you can take for pain: Tylenol 500mg  1-2 tabs three times a day for pain. Aleve 1-2 tabs twice a day with food Glucosamine sulfate 750mg  twice a day is a supplement that may help. Capsaicin, aspercreme, or biofreeze topically up to four times a day may also help with pain. Cortisone injections are an option - you were given this today. Consider physical therapy with transition to home exercise program. Do home exercise program with theraband and scapular stabilization exercises daily - these are very important for long term relief even if an injection was given.  Work your way up to 3 sets of 10 once a day. If not improving at follow-up we will consider further imaging, physical therapy, and/or nitro patches. Heat or ice 15 minutes at a time 3-4 times a day as needed to help with pain. Follow up with me in 1 month.

## 2016-01-19 DIAGNOSIS — M25512 Pain in left shoulder: Secondary | ICD-10-CM | POA: Insufficient documentation

## 2016-01-19 NOTE — Progress Notes (Signed)
PCP and consultation requested by: Donato SchultzYvonne R Lowne Chase, DO  Subjective:   HPI: Patient is a 82 y.o. female here for left shoulder pain.  Patient reports she's had left shoulder pain for at least 3 months. No acute injury or trauma. Pain is 2/10 at rest, anterior, sharp - up to 10/10 at times. No night pain. Worse lying on this side though. Can radiate down to elbow. No recent increase in activity level. No skin changes, numbness. Tried aleve, voltaren gel  Past Medical History:  Diagnosis Date  . Allergic drug reaction    Opioids  . Back pain, thoracic   . Benign familial tremor   . Bruise   . Calf pain   . Chest pain    -- Myoview negative 2007  . Chronic pain syndrome   . Depression   . Edema   . Gallstones   . Hypertension   . Hypokalemia   . Meniscus tear   . Myalgia   . Osteoarthritis   . Other symptoms referable to lower leg joint   . Other urinary incontinence   . Scoliosis   . Skin rash   . Tinea corporis     Current Outpatient Prescriptions on File Prior to Visit  Medication Sig Dispense Refill  . amantadine (SYMMETREL) 100 MG capsule Take 1 capsule (100 mg total) by mouth 2 (two) times daily. 60 capsule 5  . amLODipine-valsartan (EXFORGE) 10-320 MG tablet Take 1 tablet by mouth daily. 90 tablet 1  . carbidopa-levodopa (SINEMET) 25-100 MG per tablet Take 5 tablets by mouth 4 (four) times daily.     Marland Kitchen. desoximetasone (TOPICORT) 0.05 % cream APPLY TO AFFECTED AREA TWICE A DAY 60 g 1  . OLANZapine-FLUoxetine (SYMBYAX) 12-50 MG capsule take 1 capsule by mouth every evening 90 capsule 1  . pravastatin (PRAVACHOL) 20 MG tablet Take 1 tablet (20 mg total) by mouth daily. Office visit due now 90 tablet 1   No current facility-administered medications on file prior to visit.     Past Surgical History:  Procedure Laterality Date  . ABDOMINAL HYSTERECTOMY    . BREAST LUMPECTOMY    . KNEE ARTHROSCOPY     Bilateral  . LUMBAR LAMINECTOMY/DECOMPRESSION  MICRODISCECTOMY  2007   L3-4,4-5, L5hemilaminectomy with leopard cage internal fiaxation L3-5  . ROTATOR CUFF REPAIR     Right    Allergies  Allergen Reactions  . Codeine Nausea And Vomiting  . Gabapentin Swelling    Caused Bilateral feet swelling  . Latex Other (See Comments)    Burns the skin  . Morphine Sulfate Nausea And Vomiting  . Sertraline Hcl     Unknown reaction  . Penicillins Rash    Social History   Social History  . Marital status: Widowed    Spouse name: N/A  . Number of children: 3  . Years of education: 819   Occupational History  . retired--administator [employer]   Social History Main Topics  . Smoking status: Never Smoker  . Smokeless tobacco: Never Used  . Alcohol use No  . Drug use: No  . Sexual activity: No   Other Topics Concern  . Not on file   Social History Narrative  . No narrative on file    Family History  Problem Relation Age of Onset  . Stroke Mother   . Parkinsonism Mother     BP (!) 170/84   Pulse 80   Ht 5\' 6"  (1.676 m)   Wt 270 lb (  122.5 kg)   BMI 43.58 kg/m   Review of Systems: See HPI above.    Objective:  Physical Exam:  Gen: NAD, comfortable in exam room  Left shoulder: No swelling, ecchymoses.  No gross deformity. No TTP. Pain on abduction and flexion past 110 degrees.  ER 45 degrees but equal bilaterally. Positive Hawkins, Neers. Negative Yergasons. Strength 5/5 with empty can and resisted internal/external rotation.  Pain empty can. NV intact distally.  Right shoulder: FROM without pain.    Assessment & Plan:  1. Left shoulder pain - independently reviewed radiographs - moderate glenohumeral DJD but no acute abnormalities.  Pain 2/2 impingement and glenohumeral DJD.  Discussed tylenol, nsaids, glucosamine, topical medications.  Combination injection given as well.  Shown home exercises to do daily.  F/u in 1 month.  After informed written consent, patient was seated on exam table.  Left shoulder was prepped with alcohol swab and utilizing posterior approach, patient's left shoulder was injected with 6:2 marcaine:depomedrol with half in the subacromial space and half in glenohumeral space.  Patient tolerated the procedure well without immediate complications.

## 2016-01-19 NOTE — Assessment & Plan Note (Signed)
independently reviewed radiographs - moderate glenohumeral DJD but no acute abnormalities.  Pain 2/2 impingement and glenohumeral DJD.  Discussed tylenol, nsaids, glucosamine, topical medications.  Combination injection given as well.  Shown home exercises to do daily.  F/u in 1 month.  After informed written consent, patient was seated on exam table. Left shoulder was prepped with alcohol swab and utilizing posterior approach, patient's left shoulder was injected with 6:2 marcaine:depomedrol with half in the subacromial space and half in glenohumeral space.  Patient tolerated the procedure well without immediate complications.

## 2016-02-10 ENCOUNTER — Other Ambulatory Visit: Payer: Self-pay | Admitting: Family Medicine

## 2016-02-10 DIAGNOSIS — I10 Essential (primary) hypertension: Secondary | ICD-10-CM

## 2016-02-10 DIAGNOSIS — E785 Hyperlipidemia, unspecified: Secondary | ICD-10-CM

## 2016-02-10 DIAGNOSIS — F329 Major depressive disorder, single episode, unspecified: Secondary | ICD-10-CM

## 2016-02-10 DIAGNOSIS — F32A Depression, unspecified: Secondary | ICD-10-CM

## 2016-02-16 ENCOUNTER — Ambulatory Visit: Payer: Medicare Other

## 2016-02-20 ENCOUNTER — Ambulatory Visit: Payer: Medicare Other | Admitting: Family Medicine

## 2016-02-26 NOTE — Progress Notes (Signed)
Pre visit review using our clinic review tool, if applicable. No additional management support is needed unless otherwise documented below in the visit note. 

## 2016-02-26 NOTE — Progress Notes (Signed)
Subjective:   [name] is a 82 y.o. female who presents for Medicare Annual (Subsequent) preventive examination.  Review of Systems:  No ROS.  Medicare Wellness Visit.  Cardiac Risk Factors include: advanced age (>7255men, 39>65 women);hypertension;obesity (BMI >30kg/m2);sedentary lifestyle Sleep patterns: Sleeps about 7 or 8 hrs per night. Feels well rested.  Home Safety/Smoke Alarms:  Smoke detectors, security alarm, and carbon monoxide detectors in place.  Living environment; residence and Firearm Safety: Lives at home with daughter in 1 story home. No firearms. Feels safe. Seat Belt Safety/Bike Helmet: Wears seatbelt sometimes.    Counseling:   Eye Exam- Wears glasses all of the time. [place]. Dental- Follows with Dr. Burr MedicoSharon Stokes.   Female:   Pap- n/a     Mammo- Last on 02/14/15:BI-RADS CATEGORY  1: Negative. F/u in 1 yr per report. Dexa scan-  Last on 10/09/12. Osteopenia. F/u in 2 years per report.   CCS- Last colonoscopy pt reports normal and she was told she no longer needed screening.       Objective:     Vitals: BP (!) 127/56 (BP Location: Right Arm, Patient Position: Sitting, Cuff Size: Large)   Pulse 67   Ht 5\' 6"  (1.676 m)   Wt 280 lb 6.4 oz (127.2 kg)   SpO2 100%   BMI 45.26 kg/m   Body mass index is 45.26 kg/m.   Tobacco History  Smoking Status  . Never Smoker  Smokeless Tobacco  . Never Used     Counseling given: No   Past Medical History:  Diagnosis Date  . Allergic drug reaction    Opioids  . Back pain, thoracic   . Benign familial tremor   . Bruise   . Calf pain   . Chest pain    -- Myoview negative 2007  . Chronic pain syndrome   . Depression   . Edema   . Gallstones   . Hypertension   . Hypokalemia   . Meniscus tear   . Myalgia   . Osteoarthritis   . Other symptoms referable to lower leg joint   . Other urinary incontinence   . Scoliosis   . Skin rash   . Tinea corporis    Past Surgical  History:  Procedure Laterality Date  . ABDOMINAL HYSTERECTOMY    . BREAST LUMPECTOMY    . KNEE ARTHROSCOPY     Bilateral  . LUMBAR LAMINECTOMY/DECOMPRESSION MICRODISCECTOMY  2007   L3-4,4-5, L5hemilaminectomy with leopard cage internal fiaxation L3-5  . ROTATOR CUFF REPAIR     Right   Family History  Problem Relation Age of Onset  . Stroke Mother   . Parkinsonism Mother    History  Sexual Activity  . Sexual activity: No    Outpatient Encounter Prescriptions as of 02/27/2016  Medication Sig  . amantadine (SYMMETREL) 100 MG capsule Take 1 capsule (100 mg total) by mouth 2 (two) times daily. (Patient taking differently: Take 100 mg by mouth daily. )  . amLODipine-valsartan (EXFORGE) 10-320 MG tablet take 1 tablet by mouth once daily  . carbidopa-levodopa (SINEMET) 25-100 MG per tablet Take 5 tablets by mouth 4 (four) times daily.   Marland Kitchen. desoximetasone (TOPICORT) 0.05 % cream APPLY TO AFFECTED AREA TWICE A DAY  . OLANZapine-FLUoxetine (SYMBYAX) 12-50 MG capsule take 1 capsule by mouth every evening  . pravastatin (PRAVACHOL) 20 MG tablet take 1 tablet by mouth once daily **OFFICE VISIT DUE NOW**  . primidone (MYSOLINE) 50 MG tablet  No facility-administered encounter medications on file as of 02/27/2016.     Activities of Daily Living In your present state of health, do you have any difficulty performing the following activities: 02/27/2016 08/18/2015  Hearing? N N  Vision? N N  Difficulty concentrating or making decisions? Malvin JohnsY Y  Walking or climbing stairs? Y Y  Dressing or bathing? N N  Doing errands, shopping? Malvin JohnsY Y  Preparing Food and eating ? N -  Using the Toilet? N -  In the past six months, have you accidently leaked urine? Y -  Do you have problems with loss of bowel control? N -  Managing your Medications? N -  Managing your Finances? Y -  Housekeeping or managing your Housekeeping? Y -  Some recent data might be hidden    Patient Care Team: Donato SchultzYvonne R Lowne Chase,  DO as PCP - General    Assessment:    Physical assessment deferred to PCP.  Exercise Activities and Dietary recommendations Current Exercise Habits: Home exercise routine, Time (Minutes): 10, Frequency (Times/Week): 3, Weekly Exercise (Minutes/Week): 30, Intensity: Mild   Diet-24 HOUR RECALL Breakfast: muffin, banana, [place]key bacon, and milk Lunch: occasional snack Dinner:  Chilli, slaw, cornbread Drinks one glass of water per day.     Goals    None     Fall Risk Fall Risk  02/27/2016 08/18/2015 05/03/2014 09/17/2012  Falls in the past year? No No No No   Depression Screen PHQ 2/9 Scores 02/27/2016 08/18/2015 05/03/2014 09/17/2012  PHQ - 2 Score 0 0 0 0     Cognitive Function MMSE - Mini Mental State Exam 02/27/2016  Orientation to time 5  Orientation to Place 0  Registration 3  Attention/ Calculation 5  Recall 2  Language- name 2 objects 2  Language- repeat 1  Language- follow 3 step command 3  Language- read & follow direction 1  Write a sentence 1  Copy design 0  Total score 23        Immunization History  Administered Date(s) Administered  . Influenza Split 01/16/2012  . Influenza Whole 12/09/2007  . Influenza, High Dose Seasonal PF 01/19/2013, 01/18/2015, 01/12/2016  . Influenza-Unspecified 12/30/2013  . Pneumococcal Conjugate-13 05/03/2014  . Pneumococcal Polysaccharide-23 03/18/2002   Screening Tests Health Maintenance  Topic Date Due  . TETANUS/TDAP  08/17/2016 (Originally 02/24/1953)  . INFLUENZA VACCINE  Completed  . DEXA SCAN  Completed  . ZOSTAVAX  Addressed  . PNA vac Low Risk Adult  Completed      Plan:     Bring a copy of your advance directives to your next office visit. Continue to eat heart healthy diet (full of fruits, vegetables, whole grains, lean protein, water--limit salt, fat, and sugar intake) and increase physical activity as tolerated. Happy Holidays.  During the course of the visit the patient was educated and counseled about the  following appropriate screening and preventive services:   Vaccines to include Pneumoccal, Influenza, Hepatitis B, Td, Zostavax, HCV  Cardiovascular Disease  Colorectal cancer screening  Bone density screening  Diabetes screening  Glaucoma screening  Mammography/PAP  Nutrition counseling   Patient Instructions (the written plan) was given to the patient.   Avon GullyBritt, Gina Leblond Angel, [place]RN  02/27/2016

## 2016-02-27 ENCOUNTER — Encounter: Payer: Self-pay | Admitting: *Deleted

## 2016-02-27 ENCOUNTER — Ambulatory Visit (INDEPENDENT_AMBULATORY_CARE_PROVIDER_SITE_OTHER): Payer: Medicare Other | Admitting: *Deleted

## 2016-02-27 VITALS — BP 127/56 | HR 67 | Ht 66.0 in | Wt 280.4 lb

## 2016-02-27 DIAGNOSIS — Z0000 Encounter for general adult medical examination without abnormal findings: Secondary | ICD-10-CM

## 2016-02-27 NOTE — Progress Notes (Signed)
reviewed

## 2016-02-27 NOTE — Patient Instructions (Signed)
Bring a copy of your advance directives to your next office visit. Continue to eat heart healthy diet (full of fruits, vegetables, whole grains, lean protein, water--limit salt, fat, and sugar intake) and increase physical activity as tolerated. Happy Holidays!! 

## 2016-03-20 ENCOUNTER — Ambulatory Visit: Payer: Medicare Other | Admitting: Medical

## 2016-05-15 ENCOUNTER — Telehealth: Payer: Self-pay | Admitting: Family Medicine

## 2016-05-15 NOTE — Telephone Encounter (Signed)
[employer] called regarding patient PA for OLANZapine-FLUoxetine (SYMBYAX) 12-50 MG capsule  She would like a call back as she needs further information for the PA please advise  Pa#- [id number]- [id number]  Phone: [phone]

## 2016-05-16 NOTE — Telephone Encounter (Signed)
Initiated PA by calling number below. Was informed this medication does not need a PA. They are doing a Tier exception to get at a lower price. Should hear back in 24 hours PA # is [id number] Will go to a pharmacist for approval.

## 2016-05-16 NOTE — Telephone Encounter (Signed)
Family needs alternative or PA.

## 2016-05-17 NOTE — Telephone Encounter (Signed)
This medication has been denied.  Attempted to do a tier exception but was not approved.  Advise on alternative.

## 2016-05-17 NOTE — Telephone Encounter (Signed)
We can split it up-- olanzapine 10 mg and prozac 40 mg ---- the doses are slightly different-- we can adjust them if they need to be increased #30 each 2 refills

## 2016-05-17 NOTE — Telephone Encounter (Signed)
Update medication list/called the patient both numbers no answer and no voice mail. There is also no pharmacy in this patients chart so need to find out where to send it.

## 2016-05-20 NOTE — Telephone Encounter (Signed)
Called both numbers in chart no answer/no voice mail set up .

## 2016-05-21 NOTE — Telephone Encounter (Signed)
Have attempted to call this patient/family regarding this PA denial and PCP instructions. No one answers/no voice mail set up on all numbers.

## 2016-06-24 ENCOUNTER — Telehealth: Payer: Self-pay | Admitting: Family Medicine

## 2016-06-24 ENCOUNTER — Other Ambulatory Visit: Payer: Self-pay

## 2016-06-24 DIAGNOSIS — F32A Depression, unspecified: Secondary | ICD-10-CM

## 2016-06-24 DIAGNOSIS — F329 Major depressive disorder, single episode, unspecified: Secondary | ICD-10-CM

## 2016-06-24 MED ORDER — OLANZAPINE-FLUOXETINE HCL 12-50 MG PO CAPS: 1.0000 | ORAL_CAPSULE | Freq: Every evening | ORAL | 1 refills | Status: DC

## 2016-06-24 NOTE — Telephone Encounter (Signed)
Can one  you print this rx off for me  PC

## 2016-06-24 NOTE — Progress Notes (Unsigned)
Wants to know if there is a generic for this medication. Patients daughter Loistine Simas [phone] cell#.   I printed a nw rx for patient to pick up and shop around.  Please advise  PC

## 2016-06-24 NOTE — Progress Notes (Signed)
See note from 2/28.

## 2016-06-25 MED ORDER — OLANZAPINE-FLUOXETINE HCL 12-50 MG PO CAPS: 1.0000 | ORAL_CAPSULE | Freq: Every evening | ORAL | 1 refills | Status: DC

## 2016-06-25 NOTE — Telephone Encounter (Signed)
Faxed in hardcopy for Olanzapine/fluoxetine See telephone note dated 05/15/2016

## 2016-06-28 NOTE — Telephone Encounter (Signed)
Daughter came in today (06/28/16) to pickup hardcopy to shop around for the best price for this prescription. She was informed that it had been faxed already, but did still have the hardcopy that was given to her to shop/find best price.

## 2016-07-30 ENCOUNTER — Telehealth: Payer: Self-pay

## 2016-07-30 NOTE — Telephone Encounter (Signed)
error 

## 2016-08-05 ENCOUNTER — Other Ambulatory Visit: Payer: Self-pay | Admitting: Family Medicine

## 2016-08-05 DIAGNOSIS — E785 Hyperlipidemia, unspecified: Secondary | ICD-10-CM

## 2016-08-11 ENCOUNTER — Other Ambulatory Visit: Payer: Self-pay | Admitting: Family Medicine

## 2016-08-11 DIAGNOSIS — I10 Essential (primary) hypertension: Secondary | ICD-10-CM

## 2016-08-15 ENCOUNTER — Telehealth: Payer: Self-pay | Admitting: Family Medicine

## 2016-08-15 NOTE — Telephone Encounter (Signed)
Advise on this request 

## 2016-08-15 NOTE — Telephone Encounter (Signed)
See phone note from 2/28

## 2016-08-15 NOTE — Telephone Encounter (Signed)
Caller name: Loistine SimasJohanna Relationship to patient: Daughter Can be reached: [phone] [facility] [address] [place] - [phone] [facility] [address] [phone] (Phone) [phone] (Fax)     Reason for call: Daughter is requesting that Rx be separated for OLANZapine-FLUoxetine (SYMBYAX) 12-50 MG capsule [[id number]][[id number]]   Request that Rx be sent for Olanzapine 12 mg and another Rx for Fluoxetine 50 mgs because of the cost.

## 2016-08-16 MED ORDER — FLUOXETINE HCL 40 MG PO CAPS: 40.0000 mg | ORAL_CAPSULE | Freq: Every day | ORAL | 3 refills | Status: DC

## 2016-08-16 MED ORDER — OLANZAPINE 10 MG PO TABS: 10.0000 mg | ORAL_TABLET | Freq: Every day | ORAL | 3 refills | Status: DC

## 2016-08-16 NOTE — Telephone Encounter (Signed)
Informed the daughter of medication sent in.

## 2016-08-16 NOTE — Telephone Encounter (Signed)
Did see 2/28 note. Sent in split/meds to pharmacy

## 2016-09-09 ENCOUNTER — Other Ambulatory Visit: Payer: Self-pay | Admitting: Family Medicine

## 2016-09-09 DIAGNOSIS — E785 Hyperlipidemia, unspecified: Secondary | ICD-10-CM

## 2016-09-22 ENCOUNTER — Other Ambulatory Visit: Payer: Self-pay | Admitting: Family Medicine

## 2016-09-22 DIAGNOSIS — E785 Hyperlipidemia, unspecified: Secondary | ICD-10-CM

## 2016-10-09 ENCOUNTER — Other Ambulatory Visit: Payer: Self-pay | Admitting: Family Medicine

## 2016-10-09 DIAGNOSIS — E785 Hyperlipidemia, unspecified: Secondary | ICD-10-CM

## 2016-10-24 ENCOUNTER — Other Ambulatory Visit: Payer: Self-pay | Admitting: Family Medicine

## 2016-10-24 DIAGNOSIS — E785 Hyperlipidemia, unspecified: Secondary | ICD-10-CM

## 2016-11-11 ENCOUNTER — Other Ambulatory Visit: Payer: Self-pay | Admitting: Family Medicine

## 2016-11-11 DIAGNOSIS — E785 Hyperlipidemia, unspecified: Secondary | ICD-10-CM

## 2016-11-14 ENCOUNTER — Encounter: Payer: Self-pay | Admitting: Family Medicine

## 2016-11-14 ENCOUNTER — Ambulatory Visit (INDEPENDENT_AMBULATORY_CARE_PROVIDER_SITE_OTHER): Payer: Medicare Other | Admitting: Family Medicine

## 2016-11-14 ENCOUNTER — Ambulatory Visit (HOSPITAL_BASED_OUTPATIENT_CLINIC_OR_DEPARTMENT_OTHER)
Admission: RE | Admit: 2016-11-14 | Discharge: 2016-11-14 | Disposition: A | Discharge status: Home / Self Care | Payer: Medicare Other | Source: Ambulatory Visit | Attending: Family Medicine | Admitting: Family Medicine

## 2016-11-14 VITALS — BP 134/86 | HR 95 | Temp 98.4°F | Ht 66.0 in

## 2016-11-14 DIAGNOSIS — M79674 Pain in right toe(s): Secondary | ICD-10-CM | POA: Insufficient documentation

## 2016-11-14 DIAGNOSIS — F320 Major depressive disorder, single episode, mild: Secondary | ICD-10-CM

## 2016-11-14 DIAGNOSIS — G20 Parkinson's disease: Secondary | ICD-10-CM

## 2016-11-14 DIAGNOSIS — I10 Essential (primary) hypertension: Secondary | ICD-10-CM

## 2016-11-14 DIAGNOSIS — E785 Hyperlipidemia, unspecified: Secondary | ICD-10-CM | POA: Diagnosis not present

## 2016-11-14 DIAGNOSIS — Z23 Encounter for immunization: Secondary | ICD-10-CM

## 2016-11-14 DIAGNOSIS — S92511A Displaced fracture of proximal phalanx of right lesser toe(s), initial encounter for closed fracture: Secondary | ICD-10-CM | POA: Diagnosis not present

## 2016-11-14 DIAGNOSIS — X58XXXA Exposure to other specified factors, initial encounter: Secondary | ICD-10-CM | POA: Diagnosis not present

## 2016-11-14 MED ORDER — AMLODIPINE BESYLATE-VALSARTAN 10-320 MG PO TABS: 1.0000 | ORAL_TABLET | Freq: Every day | ORAL | 1 refills | Status: DC

## 2016-11-14 MED ORDER — PRAVASTATIN SODIUM 20 MG PO TABS: 20.0000 mg | ORAL_TABLET | Freq: Every day | ORAL | 1 refills | Status: DC

## 2016-11-14 NOTE — Patient Instructions (Signed)

## 2016-11-14 NOTE — Progress Notes (Signed)
Patient ID: [name], female    DOB: 04/24/1933  Age: 83 y.o. MRN: [id number]    Subjective:  Subjective  HPI [name] presents for f/u bp and cholesterol  She also c/o pain r 5th toe-- she kicked a chair on Monday and it has been swollen and painful since.  Her daughter is with her  Review of Systems  Constitutional: Negative for appetite change, diaphoresis, fatigue and unexpected weight change.  Eyes: Negative for pain, redness and visual disturbance.  Respiratory: Negative for cough, chest tightness, shortness of breath and wheezing.   Cardiovascular: Negative for chest pain, palpitations and leg swelling.  Endocrine: Negative for cold intolerance, heat intolerance, polydipsia, polyphagia and polyuria.  Genitourinary: Negative for difficulty urinating, dysuria and frequency.  Neurological: Negative for dizziness, light-headedness, numbness and headaches.  Psychiatric/Behavioral: Negative for agitation, behavioral problems, confusion and dysphoric mood. The patient is not nervous/anxious.     History Past Medical History:  Diagnosis Date  . Allergic drug reaction    Opioids  . Back pain, thoracic   . Benign familial tremor   . Bruise   . Calf pain   . Chest pain    -- Myoview negative 2007  . Chronic pain syndrome   . Depression   . Edema   . Gallstones   . Hypertension   . Hypokalemia   . Meniscus tear   . Myalgia   . Osteoarthritis   . Other symptoms referable to lower leg joint   . Other urinary incontinence   . Scoliosis   . Skin rash   . Tinea corporis     She has a past surgical history that includes Abdominal hysterectomy; Rotator cuff repair; Lumbar laminectomy/decompression microdiscectomy (2007); Knee arthroscopy; and Breast lumpectomy.   Her family history includes Parkinsonism in her mother; Stroke in her mother.She reports that she has never smoked. She has never used smokeless tobacco. She reports that she does not drink alcohol or use  drugs.  Current Outpatient Prescriptions on File Prior to Visit  Medication Sig Dispense Refill  . amantadine (SYMMETREL) 100 MG capsule Take 1 capsule (100 mg total) by mouth 2 (two) times daily. (Patient taking differently: Take 100 mg by mouth daily. ) 60 capsule 5  . carbidopa-levodopa (SINEMET) 25-100 MG per tablet Take 5 tablets by mouth 4 (four) times daily.     Marland Kitchen. desoximetasone (TOPICORT) 0.05 % cream APPLY TO AFFECTED AREA TWICE A DAY 60 g 1  . FLUoxetine (PROZAC) 40 MG capsule Take 1 capsule (40 mg total) by mouth daily. 30 capsule 3  . OLANZapine (ZYPREXA) 10 MG tablet Take 1 tablet (10 mg total) by mouth daily. 30 tablet 3  . primidone (MYSOLINE) 50 MG tablet   0   No current facility-administered medications on file prior to visit.      Objective:  Objective  Physical Exam  Constitutional: She is oriented to person, place, and time. She appears well-developed and well-nourished.  HENT:  Head: Normocephalic and atraumatic.  Eyes: Conjunctivae and EOM are normal.  Neck: Normal range of motion. Neck supple. No JVD present. Carotid bruit is not present. No thyromegaly present.  Cardiovascular: Normal rate, regular rhythm and normal heart sounds.   No murmur heard. Pulmonary/Chest: Effort normal and breath sounds normal. No respiratory distress. She has no wheezes. She has no rales. She exhibits no tenderness.  Musculoskeletal: She exhibits no edema.  Neurological: She is alert and oriented to person, place, and time.  Psychiatric: She has  a normal mood and affect. Her behavior is normal. Judgment and thought content normal.  Nursing note and vitals reviewed.  BP 134/86 (BP Location: Left Wrist, Patient Position: Sitting, Cuff Size: Normal)   Pulse 95   Temp 98.4 F (36.9 C) (Oral)   Ht 5\' 6"  (1.676 m)   SpO2 95%  Wt Readings from Last 3 Encounters:  02/27/16 280 lb 6.4 oz (127.2 kg)  01/17/16 270 lb (122.5 kg)  01/12/16 273 lb (123.8 kg)       Dg Shoulder  Left  Result Date: 01/12/2016 CLINICAL DATA:  Pt with left shoulder pain with painful limited ROM, chronicNo prior sx, had rotator cuff repair to right shoulder EXAM: LEFT SHOULDER - 2+ VIEW COMPARISON:  None. FINDINGS: No fracture or bone lesion.  No dislocation. Mild spurring is noted at the [facility] joint. AC joint is normally spaced and aligned. Mild spurring is noted from the inferior glenoid and inferior humeral head. There are small ossified densities, 1 of which projects along the inferior left humeral head and the other anterior to the left glenoid. Bones are demineralized. Soft tissues are unremarkable. IMPRESSION: 1. No fracture or acute finding. 2. Mild glenohumeral joint arthropathic changes. Possible intra-articular ossified bodies. 3. Mild AC joint osteoarthritis. Electronically Signed   By: Amie Portland M.D.   On: 01/12/2016 13:57     Assessment & Plan:  Plan  I have changed Ms. Pang's amLODipine-valsartan and pravastatin. I am also having her maintain her carbidopa-levodopa, amantadine, desoximetasone, primidone, FLUoxetine, and OLANZapine.  Meds ordered this encounter  Medications  . amLODipine-valsartan (EXFORGE) 10-320 MG tablet    Sig: Take 1 tablet by mouth daily.    Dispense:  90 tablet    Refill:  1  . pravastatin (PRAVACHOL) 20 MG tablet    Sig: Take 1 tablet (20 mg total) by mouth daily.    Dispense:  90 tablet    Refill:  1    Problem List Items Addressed This Visit      Unprioritized   Depression    Stable Doing well with meds      Essential hypertension    Well controlled, no changes to meds. Encouraged heart healthy diet such as the DASH diet and exercise as tolerated.       Relevant Medications   amLODipine-valsartan (EXFORGE) 10-320 MG tablet   pravastatin (PRAVACHOL) 20 MG tablet   Other Relevant Orders   Comprehensive metabolic panel   Hyperlipidemia    Tolerating statin, encouraged heart healthy diet, avoid trans fats, minimize simple carbs and  saturated fats. Increase exercise as tolerated      Relevant Medications   amLODipine-valsartan (EXFORGE) 10-320 MG tablet   pravastatin (PRAVACHOL) 20 MG tablet   Other Relevant Orders   Lipid Profile   Symptomatic Parkinson disease (HCC)    Per neuro       Toe pain, right - Primary    Buddy tape Post op shoe Xray today Consider ortho       Relevant Orders   DG Foot Complete Right (Completed)    Other Visit Diagnoses    Need for influenza vaccination       Relevant Orders   Flu vaccine HIGH DOSE PF (Fluzone High dose) (Completed)      Follow-up: Return in about 6 months (around 05/15/2017) for annual exam, fasting.  Donato Schultz, DO

## 2016-11-17 DIAGNOSIS — M79674 Pain in right toe(s): Secondary | ICD-10-CM | POA: Insufficient documentation

## 2016-11-17 NOTE — Assessment & Plan Note (Signed)
Buddy tape Post op shoe Xray today Consider ortho

## 2016-11-17 NOTE — Assessment & Plan Note (Signed)
Well controlled, no changes to meds. Encouraged heart healthy diet such as the DASH diet and exercise as tolerated.  °

## 2016-11-17 NOTE — Assessment & Plan Note (Signed)
Stable Doing well with meds

## 2016-11-17 NOTE — Assessment & Plan Note (Signed)
Tolerating statin, encouraged heart healthy diet, avoid trans fats, minimize simple carbs and saturated fats. Increase exercise as tolerated 

## 2016-11-17 NOTE — Assessment & Plan Note (Signed)
Per neuro 

## 2016-12-10 ENCOUNTER — Other Ambulatory Visit: Payer: Self-pay | Admitting: Family Medicine

## 2017-02-28 ENCOUNTER — Ambulatory Visit (HOSPITAL_BASED_OUTPATIENT_CLINIC_OR_DEPARTMENT_OTHER)
Admission: RE | Admit: 2017-02-28 | Discharge: 2017-02-28 | Disposition: A | Discharge status: Home / Self Care | Payer: Medicare Other | Source: Ambulatory Visit | Attending: Family Medicine | Admitting: Family Medicine

## 2017-02-28 ENCOUNTER — Encounter: Payer: Self-pay | Admitting: Family Medicine

## 2017-02-28 ENCOUNTER — Ambulatory Visit (INDEPENDENT_AMBULATORY_CARE_PROVIDER_SITE_OTHER): Payer: Medicare Other | Admitting: Family Medicine

## 2017-02-28 VITALS — BP 122/76 | HR 78 | Temp 98.5°F | Resp 16 | Ht 66.0 in | Wt 281.0 lb

## 2017-02-28 DIAGNOSIS — M546 Pain in thoracic spine: Secondary | ICD-10-CM

## 2017-02-28 DIAGNOSIS — M47814 Spondylosis without myelopathy or radiculopathy, thoracic region: Secondary | ICD-10-CM | POA: Diagnosis not present

## 2017-02-28 DIAGNOSIS — R110 Nausea: Secondary | ICD-10-CM

## 2017-02-28 DIAGNOSIS — Z981 Arthrodesis status: Secondary | ICD-10-CM | POA: Insufficient documentation

## 2017-02-28 DIAGNOSIS — M2578 Osteophyte, vertebrae: Secondary | ICD-10-CM | POA: Insufficient documentation

## 2017-02-28 DIAGNOSIS — M4185 Other forms of scoliosis, thoracolumbar region: Secondary | ICD-10-CM | POA: Insufficient documentation

## 2017-02-28 DIAGNOSIS — E785 Hyperlipidemia, unspecified: Secondary | ICD-10-CM

## 2017-02-28 DIAGNOSIS — J9811 Atelectasis: Secondary | ICD-10-CM | POA: Insufficient documentation

## 2017-02-28 LAB — COMPREHENSIVE METABOLIC PANEL
ALBUMIN: 4.1 g/dL (ref 3.5–5.2)
ALT: 3 U/L (ref 0–35)
AST: 9 U/L (ref 0–37)
Alkaline Phosphatase: 85 U/L (ref 39–117)
BILIRUBIN TOTAL: 0.4 mg/dL (ref 0.2–1.2)
BUN: 16 mg/dL (ref 6–23)
CALCIUM: 9 mg/dL (ref 8.4–10.5)
CO2: 31 mEq/L (ref 19–32)
CREATININE: 0.9 mg/dL (ref 0.40–1.20)
Chloride: 106 mEq/L (ref 96–112)
GFR: 77.28 mL/min (ref >60.00)
Glucose, Bld: 100 mg/dL — ABNORMAL HIGH (ref 70–99)
Potassium: 4.4 mEq/L (ref 3.5–5.1)
Sodium: 142 mEq/L (ref 135–145)
Total Protein: 7.1 g/dL (ref 6.0–8.3)

## 2017-02-28 LAB — LIPID PANEL
CHOL/HDL RATIO: 3
Cholesterol: 130 mg/dL (ref 0–200)
HDL: 44.2 mg/dL (ref >39.00)
LDL Cholesterol: 71 mg/dL (ref 0–99)
NonHDL: 85.5
TRIGLYCERIDES: 71 mg/dL (ref 0.0–149.0)
VLDL: 14.2 mg/dL (ref 0.0–40.0)

## 2017-02-28 LAB — H. PYLORI ANTIBODY, IGG: H Pylori IgG: NEGATIVE

## 2017-02-28 MED ORDER — TIZANIDINE HCL 4 MG PO TABS: 4.0000 mg | ORAL_TABLET | Freq: Four times a day (QID) | ORAL | 1 refills | Status: DC | PRN

## 2017-02-28 MED ORDER — RANITIDINE HCL 150 MG PO TABS: 150.0000 mg | ORAL_TABLET | Freq: Two times a day (BID) | ORAL | 5 refills | Status: DC

## 2017-02-28 NOTE — Patient Instructions (Signed)
Nausea, Adult  Nausea is the feeling of an upset stomach or having to vomit. Nausea on its own is not usually a serious concern, but it may be an early sign of a more serious medical problem. As nausea gets worse, it can lead to vomiting. If vomiting develops, or if you are not able to drink enough fluids, you are at risk of becoming dehydrated. Dehydration can make you tired and thirsty, cause you to have a dry mouth, and decrease how often you urinate. Older adults and people with other diseases or a weak immune system are at higher risk for dehydration. The main goals of treating your nausea are:  · To limit repeated nausea episodes.  · To prevent vomiting and dehydration.    Follow these instructions at home:  Follow instructions from your health care provider about how to care for yourself at home.  Eating and drinking   Follow these recommendations as told by your health care provider:  · Take an oral rehydration solution (ORS). This is a drink that is sold at pharmacies and retail stores.  · Drink clear fluids in small amounts as you are able. Clear fluids include water, ice chips, diluted fruit juice, and low-calorie sports drinks.  · Eat bland, easy-to-digest foods in small amounts as you are able. These foods include bananas, applesauce, rice, lean meats, toast, and crackers.  · Avoid drinking fluids that contain a lot of sugar or caffeine, such as energy drinks, sports drinks, and soda.  · Avoid alcohol.  · Avoid spicy or fatty foods.    General instructions   · Drink enough fluid to keep your urine clear or pale yellow.  · Wash your hands often. If soap and water are not available, use hand sanitizer.  · Make sure that all people in your household wash their hands well and often.  · Rest at home while you recover.  · Take over-the-counter and prescription medicines only as told by your health care provider.  · Breathe slowly and deeply when you feel nauseous.  · Watch your condition for any  changes.  · Keep all follow-up visits as told by your health care provider. This is important.  Contact a health care provider if:  · You have a headache.  · You have new symptoms.  · Your nausea gets worse.  · You have a fever.  · You feel light-headed or dizzy.  · You vomit.  · You cannot keep fluids down.  Get help right away if:  · You have pain in your chest, neck, arm, or jaw.  · You feel extremely weak or you faint.  · You have vomit that is bright red or looks like coffee grounds.  · You have bloody or black stools or stools that look like tar.  · You have a severe headache, a stiff neck, or both.  · You have severe pain, cramping, or bloating in your abdomen.  · You have a rash.  · You have difficulty breathing or are breathing very quickly.  · Your heart is beating very quickly.  · Your skin feels cold and clammy.  · You feel confused.  · You have pain when you urinate.  · You have signs of dehydration, such as:  ? Dark urine, very little, or no urine.  ? Cracked lips.  ? Dry mouth.  ? Sunken eyes.  ? Sleepiness.  ? Weakness.  These symptoms may represent a serious problem that is an emergency. Do   not wait to see if the symptoms will go away. Get medical help right away. Call your local emergency services (911 in the [place] Do not drive yourself to the hospital.  This information is not intended to replace advice given to you by your health care provider. Make sure you discuss any questions you have with your health care provider.  Document Released: 04/11/2004 Document Revised: 08/07/2015 Document Reviewed: 11/08/2014  Elsevier Interactive Patient Education © 2017 [employer]

## 2017-02-28 NOTE — Assessment & Plan Note (Signed)
Check xray Chest xray Muscle relaxer per orders Consider phy therapy MRI If no improvement

## 2017-02-28 NOTE — Progress Notes (Signed)
Patient ID: [name], female   DOB: 08/29/1933, 83 y.o.   MRN: [id number]     Subjective:  I acted as a Neurosurgeonscribe for Dr. Zola [name].  [name], CMA   Patient ID: [name], female    DOB: 09/17/1933, 83 y.o.   MRN: [id number]  Chief Complaint  Patient presents with  . Back Pain    HPI  Patient is in today for mid back pain.  Pain started about 2 months ago and has gotten worse.  No injuries.  Has not taken anything for it.  Has has some nausea and daughter is not sure if that has anything to do with the back pain.    Patient Care Team: [name], [name] CongressYvonne R, DO as PCP - General   Past Medical History:  Diagnosis Date  . Allergic drug reaction    Opioids  . Back pain, thoracic   . Benign familial tremor   . Bruise   . Calf pain   . Chest pain    -- Myoview negative 2007  . Chronic pain syndrome   . Depression   . Edema   . Gallstones   . Hypertension   . Hypokalemia   . Meniscus tear   . Myalgia   . Osteoarthritis   . Other symptoms referable to lower leg joint   . Other urinary incontinence   . Scoliosis   . Skin rash   . Tinea corporis     Past Surgical History:  Procedure Laterality Date  . ABDOMINAL HYSTERECTOMY    . BREAST LUMPECTOMY    . KNEE ARTHROSCOPY     Bilateral  . LUMBAR LAMINECTOMY/DECOMPRESSION MICRODISCECTOMY  2007   L3-4,4-5, L5hemilaminectomy with leopard cage internal fiaxation L3-5  . ROTATOR CUFF REPAIR     Right    Family History  Problem Relation Age of Onset  . Stroke Mother   . Parkinsonism Mother     Social History   Socioeconomic History  . Marital status: Widowed    Spouse name: Not on file  . Number of children: 3  . Years of education: 4219  . Highest education level: Not on file  Social Needs  . Financial resource strain: Not on file  . Food insecurity - worry: Not on file  . Food insecurity - inability: Not on file  . Transportation needs - medical: Not on file  . Transportation needs - non-medical: Not  on file  Occupational History  . Occupation: Midwiferetired--administator    Employer: [employer]  Tobacco Use  . Smoking status: Never Smoker  . Smokeless tobacco: Never Used  Substance and Sexual Activity  . Alcohol use: No  . Drug use: No  . Sexual activity: No    Partners: Male  Other Topics Concern  . Not on file  Social History Narrative  . Not on file    Outpatient Medications Prior to Visit  Medication Sig Dispense Refill  . amantadine (SYMMETREL) 100 MG capsule Take 1 capsule (100 mg total) by mouth 2 (two) times daily. (Patient taking differently: Take 100 mg by mouth daily. ) 60 capsule 5  . amLODipine-valsartan (EXFORGE) 10-320 MG tablet Take 1 tablet by mouth daily. 90 tablet 1  . carbidopa-levodopa (SINEMET) 25-100 MG per tablet Take 5 tablets by mouth 4 (four) times daily.     Marland Kitchen. desoximetasone (TOPICORT) 0.05 % cream APPLY TO AFFECTED AREA TWICE A DAY 60 g 1  . FLUoxetine (PROZAC) 40 MG capsule take 1 capsule by  mouth once daily 30 capsule 3  . OLANZapine (ZYPREXA) 10 MG tablet take 1 tablet by mouth once daily 30 tablet 3  . pravastatin (PRAVACHOL) 20 MG tablet Take 1 tablet (20 mg total) by mouth daily. 90 tablet 1  . primidone (MYSOLINE) 50 MG tablet   0   No facility-administered medications prior to visit.     Allergies  Allergen Reactions  . Codeine Nausea And Vomiting  . Gabapentin Swelling    Caused Bilateral feet swelling  . Latex Other (See Comments)    Burns the skin  . Morphine Sulfate Nausea And Vomiting  . Sertraline Hcl     Unknown reaction  . Penicillins Rash    Review of Systems  Constitutional: Negative for fever and malaise/fatigue.  HENT: Negative for congestion.   Eyes: Negative for blurred vision.  Respiratory: Negative for cough and shortness of breath.   Cardiovascular: Negative for chest pain, palpitations and leg swelling.  Gastrointestinal: Positive for nausea. Negative for constipation and vomiting.  Genitourinary:  Negative for dysuria, flank pain, frequency and urgency.  Musculoskeletal: Negative for back pain.  Skin: Negative for rash.  Neurological: Negative for loss of consciousness and headaches.       Objective:    Physical Exam  Constitutional: She appears well-developed and well-nourished.  HENT:  Head: Normocephalic and atraumatic.  Eyes: Conjunctivae and EOM are normal.  Neck: Normal range of motion. Neck supple. No JVD present. Carotid bruit is not present. No thyromegaly present.  Cardiovascular: Normal rate, regular rhythm and normal heart sounds.  No murmur heard. Pulmonary/Chest: Effort normal and breath sounds normal. No respiratory distress. She has no wheezes. She has no rales. She exhibits no tenderness.  Musculoskeletal: She exhibits tenderness. She exhibits no edema.       Thoracic back: She exhibits decreased range of motion, tenderness and pain. She exhibits no edema.  Psychiatric: She has a normal mood and affect.  Nursing note and vitals reviewed.   BP 122/76 (BP Location: Left Wrist, Cuff Size: Normal)   Pulse 78   Temp 98.5 F (36.9 C) (Oral)   Resp 16   Ht 5\' 6"  (1.676 m)   Wt 281 lb (127.5 kg)   SpO2 96%   BMI 45.35 kg/m  Wt Readings from Last 3 Encounters:  02/28/17 281 lb (127.5 kg)  02/27/16 280 lb 6.4 oz (127.2 kg)  01/17/16 270 lb (122.5 kg)   BP Readings from Last 3 Encounters:  02/28/17 122/76  11/14/16 134/86  02/27/16 (!) 127/56     Immunization History  Administered Date(s) Administered  . Influenza Split 01/16/2012  . Influenza Whole 12/09/2007  . Influenza, High Dose Seasonal PF 01/19/2013, 01/18/2015, 01/12/2016, 11/14/2016  . Influenza-Unspecified 12/30/2013  . Pneumococcal Conjugate-13 05/03/2014  . Pneumococcal Polysaccharide-23 03/18/2002    Health Maintenance  Topic Date Due  . TETANUS/TDAP  02/24/1955  . INFLUENZA VACCINE  Completed  . DEXA SCAN  Completed  . PNA vac Low Risk Adult  Completed       Lab Results    Component Value Date   WBC 5.3 08/18/2015   HGB 12.5 08/18/2015   HCT 38.1 08/18/2015   MCV 85.4 08/18/2015   PLT 354.0 08/18/2015   Lab Results  Component Value Date   NA 142 02/28/2017   K 4.4 02/28/2017   CO2 31 02/28/2017   GLUCOSE 100 (H) 02/28/2017   BUN 16 02/28/2017   CREATININE 0.90 02/28/2017   BILITOT 0.4 02/28/2017   ALKPHOS 85 02/28/2017  AST 9 02/28/2017   ALT 3 02/28/2017   PROT 7.1 02/28/2017   ALBUMIN 4.1 02/28/2017   CALCIUM 9.0 02/28/2017   GFR 77.28 02/28/2017   Lab Results  Component Value Date   CHOL 130 02/28/2017   Lab Results  Component Value Date   HDL 44.20 02/28/2017   Lab Results  Component Value Date   LDLCALC 71 02/28/2017   Lab Results  Component Value Date   TRIG 71.0 02/28/2017   Lab Results  Component Value Date   CHOLHDL 3 02/28/2017   Lab Results  Component Value Date   HGBA1C 6.2 05/11/2014         Assessment & Plan:   Problem List Items Addressed This Visit      Unprioritized   BACK PAIN, THORACIC REGION, RIGHT    Check xray Chest xray Muscle relaxer per orders Consider phy therapy MRI If no improvement       Relevant Medications   tiZANidine (ZANAFLEX) 4 MG tablet   Hyperlipidemia   Relevant Orders   Lipid panel (Completed)   H. pylori antibody, IgG (Completed)   Comprehensive metabolic panel (Completed)    Other Visit Diagnoses    Acute midline thoracic back pain    -  Primary   Relevant Medications   tiZANidine (ZANAFLEX) 4 MG tablet   Other Relevant Orders   DG Thoracic Spine 2 View (Completed)   DG Chest 2 View (Completed)   Nausea       Relevant Medications   ranitidine (ZANTAC) 150 MG tablet   Other Relevant Orders   Lipid panel (Completed)   H. pylori antibody, IgG (Completed)   Comprehensive metabolic panel (Completed)      I am having Joanette Gula. Redder start on tiZANidine and ranitidine. I am also having her maintain her carbidopa-levodopa, amantadine, desoximetasone, primidone,  amLODipine-valsartan, pravastatin, FLUoxetine, and OLANZapine.  Meds ordered this encounter  Medications  . tiZANidine (ZANAFLEX) 4 MG tablet    Sig: Take 1 tablet (4 mg total) by mouth every 6 (six) hours as needed for muscle spasms.    Dispense:  30 tablet    Refill:  1  . ranitidine (ZANTAC) 150 MG tablet    Sig: Take 1 tablet (150 mg total) by mouth 2 (two) times daily.    Dispense:  60 tablet    Refill:  5  CMA served as scribe during this visit. History, Physical and Plan performed by medical provider. Documentation and orders reviewed and attested to. Donato Schultz, DO

## 2017-03-04 ENCOUNTER — Other Ambulatory Visit: Payer: Self-pay

## 2017-03-04 ENCOUNTER — Other Ambulatory Visit: Payer: Self-pay | Admitting: Family Medicine

## 2017-03-04 ENCOUNTER — Telehealth: Payer: Self-pay | Admitting: Family Medicine

## 2017-03-04 DIAGNOSIS — M4120 Other idiopathic scoliosis, site unspecified: Secondary | ICD-10-CM

## 2017-03-04 MED ORDER — PREDNISONE 20 MG PO TABS: 40.0000 mg | ORAL_TABLET | Freq: Every day | ORAL | 0 refills | Status: DC

## 2017-03-04 NOTE — Telephone Encounter (Signed)
Copied from CRM [id number]. Topic: Quick Communication - See Telephone Encounter >> Mar 04, 2017  4:22 PM Terisa Starraylor, Brittany L wrote: CRM for notification. See Telephone encounter for:   03/04/17.  Pt's daughter called and stated that her mother was in pain & that Latoya called in something for inflammation. She said she is having a lot of pain in her back. Call back is [phone].

## 2017-03-05 NOTE — Telephone Encounter (Signed)
Called Pt and she stated that she hadn't picked up or taken any of the prednisone. Pt states that its from inflammation not pain and she wanted a "pain pill". I advised Pt that since she's having inflammation, it could very well be a cause of some of the pain, she should take Rx as advised by the Dr to see if it helps. Pt states that she'd comply. I advised Pt to give us a call tomorrow in the event she's not feeling any better and we can discuss other options at that time.

## 2017-03-08 ENCOUNTER — Ambulatory Visit (HOSPITAL_BASED_OUTPATIENT_CLINIC_OR_DEPARTMENT_OTHER)
Admission: RE | Admit: 2017-03-08 | Discharge: 2017-03-08 | Disposition: A | Discharge status: Home / Self Care | Payer: Medicare Other | Source: Ambulatory Visit | Attending: Family Medicine | Admitting: Family Medicine

## 2017-03-08 DIAGNOSIS — M4120 Other idiopathic scoliosis, site unspecified: Secondary | ICD-10-CM | POA: Diagnosis not present

## 2017-03-08 DIAGNOSIS — M4804 Spinal stenosis, thoracic region: Secondary | ICD-10-CM | POA: Diagnosis not present

## 2017-03-12 ENCOUNTER — Telehealth: Payer: Self-pay | Admitting: Family Medicine

## 2017-03-12 DIAGNOSIS — M549 Dorsalgia, unspecified: Principal | ICD-10-CM

## 2017-03-12 DIAGNOSIS — R8290 Unspecified abnormal findings in urine: Secondary | ICD-10-CM

## 2017-03-12 DIAGNOSIS — G8929 Other chronic pain: Secondary | ICD-10-CM

## 2017-03-12 NOTE — Telephone Encounter (Signed)
Pt. Is asking for something for pain again. Please advise.

## 2017-03-12 NOTE — Telephone Encounter (Signed)
Copied from CRM [id number]. Topic: Quick Communication - Rx Refill/Question >> Mar 12, 2017 10:11 AM Gerrianne ScalePayne, Shakeia Krus L wrote: Has the patient contacted their pharmacy? No.   (Agent: If no, request that the patient contact the pharmacy for the refill.)medicine for extreme pain she had called last week for pain medicine for rt side pain   Preferred Pharmacy (with phone number or street name): [facility] [address] - [place], [place] - [phone] [phone] (Phone) [phone] (Fax)     Agent: Please be advised that RX refills may take up to 3 business days. We ask that you follow-up with your pharmacy.

## 2017-03-13 ENCOUNTER — Telehealth: Payer: Self-pay | Admitting: *Deleted

## 2017-03-13 MED ORDER — OXYCODONE-ACETAMINOPHEN 5-325 MG PO TABS: 1.0000 | ORAL_TABLET | Freq: Four times a day (QID) | ORAL | 0 refills | Status: DC | PRN

## 2017-03-13 NOTE — Telephone Encounter (Signed)
Called her back- spoke with her daughter [name] She an appt with [facility] next week  However she has complaint of severe pain in her back for 10 days or so and really would like some relief  Went over her MRI results  She has used oxycodone in the past and tolerated this ok Will rx oxycodone for her- cautioned pt and her daughter that this may cause drowsiness and could increase fall risk Discussed her recent spine MRI- I do suspect this is the cause of her back pain. They wonder about the renal cyst that is noted but explained that this is likely an incidental finding and not the cause of her pain   They also plan to bring in a urine sample for culture due to concern about UTI- will order this for her    Meds ordered this encounter  Medications  . oxyCODONE-acetaminophen (PERCOCET/ROXICET) 5-325 MG tablet    Sig: Take 1 tablet by mouth every 6 (six) hours as needed for severe pain.    Dispense:  20 tablet    Refill:  0

## 2017-03-13 NOTE — Telephone Encounter (Signed)
Copied from CRM [id number]. Topic: Referral - Request >> Mar 12, 2017 10:07 AM Gerrianne ScalePayne, Angela L wrote: Reason for CRM: patient daughter Loistine Simasjohanna [phone] would like for a referral to a Kidney Doctor the MRI showed that pt has a leison or cyst on kidney and want to further investigate that she has right side pain

## 2017-03-13 NOTE — Telephone Encounter (Signed)
Can you review MRI again please.  Dr. Laury AxonLowne did not say anything about this?

## 2017-03-13 NOTE — Telephone Encounter (Signed)
Copied from CRM [id number]. Topic: General - Other >> Mar 12, 2017 10:14 AM Gerrianne ScalePayne, Angela L wrote: Reason for CRM: patient daughter would like to stop by office to get a urine cup for her Mother to drop off for a urinalysis  it smells strong and dark yellow

## 2017-03-13 NOTE — Telephone Encounter (Signed)
Patient was only given prednisone

## 2017-03-20 ENCOUNTER — Ambulatory Visit (INDEPENDENT_AMBULATORY_CARE_PROVIDER_SITE_OTHER): Admitting: Surgery

## 2017-03-20 ENCOUNTER — Encounter (INDEPENDENT_AMBULATORY_CARE_PROVIDER_SITE_OTHER): Payer: Self-pay

## 2017-03-24 ENCOUNTER — Ambulatory Visit (INDEPENDENT_AMBULATORY_CARE_PROVIDER_SITE_OTHER): Payer: Medicare Other | Admitting: Specialist

## 2017-03-24 ENCOUNTER — Encounter (INDEPENDENT_AMBULATORY_CARE_PROVIDER_SITE_OTHER): Payer: Self-pay | Admitting: Specialist

## 2017-03-24 VITALS — BP 145/78 | HR 70 | Ht 65.0 in | Wt 281.0 lb

## 2017-03-24 DIAGNOSIS — M4724 Other spondylosis with radiculopathy, thoracic region: Secondary | ICD-10-CM

## 2017-03-24 DIAGNOSIS — M4185 Other forms of scoliosis, thoracolumbar region: Secondary | ICD-10-CM

## 2017-03-24 DIAGNOSIS — N2889 Other specified disorders of kidney and ureter: Secondary | ICD-10-CM | POA: Diagnosis not present

## 2017-03-24 MED ORDER — DICLOFENAC POTASSIUM 50 MG PO TABS: 50.0000 mg | ORAL_TABLET | Freq: Two times a day (BID) | ORAL | 2 refills | Status: DC

## 2017-03-24 NOTE — Patient Instructions (Addendum)
Avoid bending, stooping and avoid lifting weights greater than 10 lbs. Avoid prolong standing and walking. Avoid frequent bending and stooping  No lifting greater than 10 lbs. May use ice or moist heat for pain. Weight loss is of benefit. Handicap license is approved. Dr. Wells BlasNewton's secretary/Assistant will call to arrange for epidural steroid injection  Ultrasound of the right kidney to assess the renal cyst for any sign of malignancy. Physical therapy for a TENS unit trial and for PT.

## 2017-03-24 NOTE — Progress Notes (Signed)
Office Visit Note   Patient: [name]           Date of Birth: 11/01/1933           MRN: [id number] Visit Date: 03/24/2017              Requested by: [address], Napi Headquartersvonne R, [place] [id number] [place], [place] [id number] PCP: Zola ButtonLowne Chase, Grayling CongressYvonne R, DO   Assessment & Plan: Visit Diagnoses:  1. Other spondylosis with radiculopathy, thoracic region   2. Other form of scoliosis of thoracolumbar spine   3. Right renal mass     Plan: Avoid bending, stooping and avoid lifting weights greater than 10 lbs. Avoid prolong standing and walking. Avoid frequent bending and stooping  No lifting greater than 10 lbs. May use ice or moist heat for pain. Weight loss is of benefit. Handicap license is approved. Dr. Kemper BlasNewton's secretary/Assistant will call to arrange for epidural steroid injection  Ultrasound of the right kidney to assess the renal cyst for any sign of malignancy. Physical therapy for a TENS unit trial and for PT.   Follow-Up Instructions: Return in about 4 weeks (around 04/21/2017).   Orders:  Orders Placed This Encounter  Procedures  . Ambulatory referral to Physical Therapy  . Ambulatory referral to Physical Medicine Rehab  . [place], retroperitnl abd,  ltd   Meds ordered this encounter  Medications  . diclofenac (CATAFLAM) 50 MG tablet    Sig: Take 1 tablet (50 mg total) by mouth 2 (two) times daily.    Dispense:  60 tablet    Refill:  2      Procedures: No procedures performed   Clinical Data: No additional findings.   Subjective: Chief Complaint  Patient presents with  . Middle Back - Pain    HPI  Review of Systems  Constitutional: Negative.   HENT: Negative.   Eyes: Negative.   Respiratory: Negative.   Cardiovascular: Negative.   Gastrointestinal: Negative.   Endocrine: Negative.   Genitourinary: Negative.   Musculoskeletal: Negative.   Skin: Negative.   Allergic/Immunologic: Negative.   Neurological: Negative.   Hematological:  Negative.   Psychiatric/Behavioral: Negative.      Objective: Vital Signs: BP (!) 145/78 (BP Location: Left Arm, Patient Position: Sitting)   Pulse 70   Ht 5\' 5"  (1.651 m)   Wt 281 lb (127.5 kg)   BMI 46.76 kg/m   Physical Exam  Constitutional: She is oriented to person, place, and time. She appears well-developed and well-nourished.  HENT:  Head: Normocephalic and atraumatic.  Eyes: EOM are normal. Pupils are equal, round, and reactive to light.  Neck: Normal range of motion. Neck supple.  Pulmonary/Chest: Effort normal and breath sounds normal.  Abdominal: Soft. Bowel sounds are normal.  Neurological: She is alert and oriented to person, place, and time.  Skin: Skin is warm and dry.  Psychiatric: She has a normal mood and affect. Her behavior is normal. Judgment and thought content normal.    Back Exam   Tenderness  The patient is experiencing tenderness in the lumbar.  Range of Motion  Extension: abnormal  Flexion: normal  Lateral bend right: abnormal  Lateral bend left: abnormal  Rotation right: abnormal  Rotation left: abnormal   Muscle Strength  Right Quadriceps:  5/5  Left Quadriceps:  5/5  Right Hamstrings:  5/5  Left Hamstrings:  5/5   Tests  Straight leg raise right: negative Straight leg raise left: negative  Reflexes  Patellar: normal Achilles: normal Biceps: normal Babinski's sign: normal   Other  Toe walk: normal Heel walk: normal Sensation: normal Gait: normal  Erythema: no back redness Scars: absent      Specialty Comments:  No specialty comments available.  Imaging: No results found.   PMFS History: Patient Active Problem List   Diagnosis Date Noted  . Toe pain, right 11/17/2016  . Left shoulder pain 01/19/2016  . Hyperlipidemia 05/03/2014  . Disc disorder of lumbar region 12/30/2013  . Obesity (BMI 30-39.9) 07/01/2013  . Parkinson disease (HCC) 09/17/2012  . Biliary dyskinesia 12/10/2011  . UTI (urinary tract  infection) 07/24/2011  . Lumbar back pain 06/12/2011  . Symptomatic Parkinson disease (HCC) 02/26/2011  . Depression 09/28/2010  . Pre-operative cardiovascular examination 07/23/2010  . CAROTID BRUIT, RIGHT 06/29/2009  . CHEST PAIN-UNSPECIFIED 01/13/2009  . SKIN RASH 10/11/2008  . EDEMA 08/25/2008  . OTHER URINARY INCONTINENCE 02/15/2008  . CHRONIC PAIN SYNDROME 12/31/2007  . BRUISE 09/14/2007  . SCOLIOSIS, LUMBAR SPINE 06/15/2007  . BACK PAIN, THORACIC REGION, RIGHT 06/11/2007  . MENISCUS TEAR 10/09/2006  . OTHER SYMPTOMS REFERABLE TO LOWER LEG JOINT 02/17/2007  . MYALGIA 02/10/2007  . CALF PAIN, RIGHT 02/10/2007  . DEPRESSION 01/01/2007  . HYPOKALEMIA, HX OF 01/01/2007  . TINEA CORPORIS 09/22/2006  . ALLERGIC DRUG REACTION, OPIOIDS 09/22/2006  . Essential hypertension 08/20/2006  . OSTEOARTHRITIS 08/20/2006   Past Medical History:  Diagnosis Date  . Allergic drug reaction    Opioids  . Back pain, thoracic   . Benign familial tremor   . Bruise   . Calf pain   . Chest pain    -- Myoview negative 2007  . Chronic pain syndrome   . Depression   . Edema   . Gallstones   . Hypertension   . Hypokalemia   . Meniscus tear   . Myalgia   . Osteoarthritis   . Other symptoms referable to lower leg joint   . Other urinary incontinence   . Scoliosis   . Skin rash   . Tinea corporis     Family History  Problem Relation Age of Onset  . Stroke Mother   . Parkinsonism Mother     Past Surgical History:  Procedure Laterality Date  . ABDOMINAL HYSTERECTOMY    . BREAST LUMPECTOMY    . KNEE ARTHROSCOPY     Bilateral  . LUMBAR LAMINECTOMY/DECOMPRESSION MICRODISCECTOMY  2007   L3-4,4-5, L5hemilaminectomy with leopard cage internal fiaxation L3-5  . ROTATOR CUFF REPAIR     Right   Social History   Occupational History  . Occupation: Midwife: [employer]  Tobacco Use  . Smoking status: Never Smoker  . Smokeless tobacco: Never Used    Substance and Sexual Activity  . Alcohol use: No  . Drug use: No  . Sexual activity: No    Partners: Male

## 2017-03-25 ENCOUNTER — Telehealth (INDEPENDENT_AMBULATORY_CARE_PROVIDER_SITE_OTHER): Payer: Self-pay | Admitting: Specialist

## 2017-03-27 ENCOUNTER — Other Ambulatory Visit (INDEPENDENT_AMBULATORY_CARE_PROVIDER_SITE_OTHER): Payer: Self-pay | Admitting: Physical Medicine and Rehabilitation

## 2017-03-27 ENCOUNTER — Telehealth (INDEPENDENT_AMBULATORY_CARE_PROVIDER_SITE_OTHER): Payer: Self-pay | Admitting: Physical Medicine and Rehabilitation

## 2017-03-27 MED ORDER — DIAZEPAM 5 MG PO TABS: ORAL_TABLET | ORAL | 0 refills | Status: DC

## 2017-03-27 NOTE — Telephone Encounter (Signed)
Faxed to patient's pharmacy.  

## 2017-03-27 NOTE — Telephone Encounter (Signed)
printed

## 2017-04-07 ENCOUNTER — Ambulatory Visit (INDEPENDENT_AMBULATORY_CARE_PROVIDER_SITE_OTHER): Payer: Medicare Other | Admitting: Physical Medicine and Rehabilitation

## 2017-04-07 ENCOUNTER — Ambulatory Visit (INDEPENDENT_AMBULATORY_CARE_PROVIDER_SITE_OTHER)

## 2017-04-07 ENCOUNTER — Encounter (INDEPENDENT_AMBULATORY_CARE_PROVIDER_SITE_OTHER): Payer: Self-pay | Admitting: Physical Medicine and Rehabilitation

## 2017-04-07 VITALS — BP 143/77 | HR 72 | Temp 98.1°F

## 2017-04-07 DIAGNOSIS — M48061 Spinal stenosis, lumbar region without neurogenic claudication: Secondary | ICD-10-CM

## 2017-04-07 DIAGNOSIS — M5416 Radiculopathy, lumbar region: Secondary | ICD-10-CM | POA: Diagnosis not present

## 2017-04-07 DIAGNOSIS — M419 Scoliosis, unspecified: Secondary | ICD-10-CM | POA: Diagnosis not present

## 2017-04-07 DIAGNOSIS — M961 Postlaminectomy syndrome, not elsewhere classified: Secondary | ICD-10-CM | POA: Diagnosis not present

## 2017-04-07 MED ORDER — DEXAMETHASONE SODIUM PHOSPHATE 10 MG/ML IJ SOLN
15.0000 mg | Freq: Once | INTRAMUSCULAR | Status: AC
  Administered 2017-04-07: 15 mg

## 2017-04-07 NOTE — Progress Notes (Signed)
Pt states a sharp pain in mid back on the right side. Pt states pain has been going on for 2 months. Pt states moving makes the pain worse, being still makes the pain better. +Driver, -BT, -Dye Allergies.

## 2017-04-07 NOTE — Patient Instructions (Signed)

## 2017-04-08 NOTE — Procedures (Signed)
[name] is an 84 year old female who comes in today at the request of Dr. Otelia SergeantNitka for right T11 and T12 transforaminal epidural steroid injection for her right sided flank and low back pain with MRI showing foraminal stenosis.  She has had prior lumbar laminectomy and decompression and fusion.  Her case is complicated by Parkinson's disease, depression and chronic pain syndrome.  Thoracic Transforaminal Epidural Steroid Injection - Infraneural Approach with Fluoroscopic Guidance  Patient: [name]      Date of Birth: 10/04/1933 MRN: [id number] PCP: Donato SchultzLowne Chase, Yvonne R, DO      Visit Date: 04/07/2017   Universal Protocol:     Consent Given By: the patient  Position: PRONE   Additional Comments: Vital signs were monitored before and after the procedure. Patient was prepped and draped in the usual sterile fashion. The correct patient, procedure, and site was verified.   Injection Procedure Details:  Procedure Site One Meds Administered:  Meds ordered this encounter  Medications  . dexamethasone (DECADRON) injection 15 mg      Laterality: Right  Location/Site:  T11-12 T12-L1  Needle size: 22 G  Needle type: Spinal  Needle Placement: Transforaminal  Findings:   -Comments: There was good flow of contrast along the nerve root and under the pedicle at both foramen but the flow was very stenotic.  Procedure Details: After squaring off the end-plates of the desired vertebral level to get a true AP view, the C-arm was obliqued to the painful side so that the superior articulating process is positioned about 1/3 the length of the inferior endplate.  The needle was aimed toward the junction of the superior articular process and the transverse process of the inferior vertebrae. The needle's initial entry is in the lower third of the foramen through Kambin's triangle. The soft tissues overlying this target were infiltrated with 2-3 ml. of 1% Lidocaine without  Epinephrine.  The spinal needle was then inserted and advanced toward the target using a "trajectory" view along the fluoroscope beam.  Under AP and lateral visualization, the needle was advanced so it did not puncture dura and did not traverse medially beyond the 6 o'clock position of the pedicle. Bi-planar projections were used to confirm position. Aspiration was confirmed to be negative for CSF and/or blood. A 1-2 ml. volume of Isovue-250 was injected and flow of contrast was noted at each level. Radiographs were obtained for documentation purposes.   After attaining the desired flow of contrast documented above, a 0.5 to 1.0 ml test dose of 0.25% Marcaine was injected into each respective transforaminal space.  The patient was observed for 90 seconds post injection.  After no sensory deficits were reported, and normal lower extremity motor function was noted,   the above injectate was administered so that equal amounts of the injectate were placed at each foramen (level) into the transforaminal epidural space.   Additional Comments:  The patient tolerated the procedure well Dressing: Band-Aid    Post-procedure details: Patient was observed during the procedure. Post-procedure instructions were reviewed.  Patient left the clinic in stable condition.   Pertinent Imaging: MRI THORACIC SPINE WITHOUT CONTRAST  TECHNIQUE: Multiplanar, multisequence MR imaging of the thoracic spine was performed. No intravenous contrast was administered.  COMPARISON:  PA and lateral chest 02/28/2017.  FINDINGS: Alignment: 0.2 cm degenerative retrolisthesis T12 on L1 is identified. Otherwise maintained.  Vertebrae: No fracture or worrisome lesion. Degenerative endplate signal change is seen from T10 into the imaged lumbar spine. Scout imaging demonstrates  changes of DISH in the cervical spine from C3-C6.  Cord:  Normal signal throughout.  Paraspinal and other soft tissues: Partially  visualized T2 hyperintense lesion right kidney is likely a cyst. Otherwise negative.  Disc levels:  T1-2: Shallow disc bulge and mild ligamentum flavum thickening without central canal or foraminal stenosis.  T2-3: Very small left paracentral protrusion without central canal or foraminal stenosis.  T3-4: Shallow right paracentral protrusion without central canal or foraminal stenosis.  T4-5: Shallow right paracentral protrusion without central canal or foraminal stenosis.  T5-6: Ligamentum flavum thickening and a shallow disc bulge are identified but the central canal and foramina are open.  T6-7: Facet arthropathy and a shallow disc bulge without central canal or foraminal stenosis.  T7-8: Facet degenerative disease, shallow disc bulge and ligamentum flavum thickening without central canal stenosis. The right foramen is open. Moderate left foraminal narrowing is seen.  T8-9: Minimal left paracentral protrusion without central canal stenosis. Mild left foraminal narrowing is seen.  T9-10: Shallow disc bulge without central canal or foraminal stenosis.  T10-11: Shallow disc bulge. Shallow disc bulge. The facets are ankylosed. The central canal is open. Marked bilateral foraminal narrowing is seen.  T11-12: Shallow disc bulge and endplate spur more prominent to the right. Right worse than left facet degenerative disease. Marked right and mild to moderate left foraminal narrowing is seen. There is mild narrowing in the right subarticular recess.  T12-L1: Facet degenerative disease and a shallow disc bulge with endplate spur. The central canal and left foramen are open. Marked right foraminal narrowing is identified.  IMPRESSION: Negative for fracture or other acute abnormality.  Moderate left foraminal narrowing T7-8.  Marked bilateral foraminal narrowing T10-11.  Marked right and mild to moderate left foraminal narrowing T11-12. There is also  narrowed scratch the there is also some narrowing in the right subarticular recess at this level.  Marked right foraminal narrowing T12-L1:   Electronically Signed   By: Drusilla Kanner M.D.   On: 03/08/2017 16:03

## 2017-04-09 ENCOUNTER — Ambulatory Visit: Payer: Medicare Other | Attending: Specialist | Admitting: Physical Therapy

## 2017-04-09 ENCOUNTER — Other Ambulatory Visit: Payer: Self-pay

## 2017-04-09 ENCOUNTER — Encounter: Payer: Self-pay | Admitting: Physical Therapy

## 2017-04-09 DIAGNOSIS — R262 Difficulty in walking, not elsewhere classified: Secondary | ICD-10-CM | POA: Diagnosis present

## 2017-04-09 DIAGNOSIS — M6281 Muscle weakness (generalized): Secondary | ICD-10-CM | POA: Insufficient documentation

## 2017-04-09 DIAGNOSIS — M546 Pain in thoracic spine: Secondary | ICD-10-CM | POA: Insufficient documentation

## 2017-04-09 NOTE — Therapy (Signed)
[facility] [address] [place], [place], [address] Phone: [phone]   Fax:  [phone]  Physical Therapy Evaluation  Patient Details  Name: [name] MRN: [id number] Date of Birth: Jun 02, 1933 Referring Provider: Naaman Plummer   Encounter Date: 04/09/2017  PT End of Session - 04/09/17 1542    Visit Number  1    Date for PT Re-Evaluation  06/07/17    PT Start Time  1453    PT Stop Time  1545    PT Time Calculation (min)  52 min    Activity Tolerance  Patient tolerated treatment well    Behavior During Therapy  [facility] for tasks assessed/performed       Past Medical History:  Diagnosis Date  . Allergic drug reaction    Opioids  . Back pain, thoracic   . Benign familial tremor   . Bruise   . Calf pain   . Chest pain    -- Myoview negative 2007  . Chronic pain syndrome   . Depression   . Edema   . Gallstones   . Hypertension   . Hypokalemia   . Meniscus tear   . Myalgia   . Osteoarthritis   . Other symptoms referable to lower leg joint   . Other urinary incontinence   . Scoliosis   . Skin rash   . Tinea corporis     Past Surgical History:  Procedure Laterality Date  . ABDOMINAL HYSTERECTOMY    . BREAST LUMPECTOMY    . KNEE ARTHROSCOPY     Bilateral  . LUMBAR LAMINECTOMY/DECOMPRESSION MICRODISCECTOMY  2007   L3-4,4-5, L5hemilaminectomy with leopard cage internal fiaxation L3-5  . ROTATOR CUFF REPAIR     Right    There were no vitals filed for this visit.   Subjective Assessment - 04/09/17 1458    Subjective  Patient reports that 2 months ago she started having significant pain inthe back, she hsa a history of lumbar laminectomy and fusion in 2007.  She is unsure of a cause.  MRI shows stenosis.  She had cortisone injections on Monday.  She reports that the injections really helped.  She did have difficulty walking previously due to Parkinsons, Chronic pain syndrome and depression     Limitations  Standing;Walking;House hold activities    Patient Stated Goals  move better, be stronger    Currently in Pain?  Yes    Pain Score  0-No pain    Pain Location  Back    Pain Orientation  Mid    Pain Descriptors / Indicators  Sharp;Aching    Pain Type  Acute pain    Pain Onset  More than a month ago    Pain Frequency  Constant    Aggravating Factors   touching the mid back, any motions pain was a 10+/10, reports much less pain with since the injections    Pain Relieving Factors  get in recliner pain would decreased to a 5/10    Effect of Pain on Daily Activities  limited everything         [facility] PT Assessment - 04/09/17 0001      Assessment   Medical Diagnosis  Mid back pain    Referring Provider  Naaman Plummer    Onset Date/Surgical Date  04/07/17    Prior Therapy  none      Precautions   Precaution Comments  has had bilateral TKR      Balance  Screen   Has the patient fallen in the past 6 months  No    Has the patient had a decrease in activity level because of a fear of falling?   No    Is the patient reluctant to leave their home because of a fear of falling?   No      Home Environment   Additional Comments  Lives with a daughter, no stairs,      Prior Function   Level of Independence  Independent with household mobility with device    Vocation  Retired    Press photographer    Leisure  no exercise      Observation/Other Assessments   Other Surveys   --      Sensation   Additional Comments  O2 saturation 93%      Posture/Postural Control   Posture Comments  fwd head, rounded shoulders      ROM / Strength   AROM / PROM / Strength  AROM;Strength      AROM   Overall AROM Comments  WFL's for LE's      Strength   Overall Strength Comments  Hips 4-/5, knees 3+/5, ankles 4/5      Flexibility   Soft Tissue Assessment /Muscle Length  yes    Hamstrings  very tight HS and calves      Palpation   Palpation comment  she is very tender in the  T8-12 area, has spasms bilaterally as well      Ambulation/Gait   Gait Comments  uses a SPC, stuggles to get up from sitting but can do it on own.  Slow, drages feet, knees are in some flexion as is the trunk, her ankles roll inward L>R      Standardized Balance Assessment   Standardized Balance Assessment  Timed Up and Go Test      Timed Up and Go Test   Normal TUG (seconds)  42             Objective measurements completed on examination: See above findings.              PT Education - 04/09/17 1542    Education provided  Yes    Education Details  Seated marches, LAQ's, ankle circles and calf/HS stretches    Person(s) Educated  Patient;Child(ren)    Methods  Explanation;Demonstration;Handout    Comprehension  Verbalized understanding       PT Short Term Goals - 04/09/17 1552      PT SHORT TERM GOAL #1   Title  independent with initial HEP    Time  2    Period  Weeks    Status  New        PT Long Term Goals - 04/09/17 1553      PT LONG TERM GOAL #1   Title  report minimal pain iwth ADL's    Time  8    Period  Weeks    Status  New      PT LONG TERM GOAL #2   Title  increase LE strength to 4/5    Time  8    Period  Weeks    Status  New      PT LONG TERM GOAL #3   Title  decrease TUG time to 28 seconds    Period  Weeks    Status  New      PT LONG TERM GOAL #4   Title  get up  from sitting without difficulty    Time  8    Period  Weeks    Status  New             Plan - 04/09/17 1543    Clinical Impression Statement  Patient with onset of severe back pain 2 months ago, is unsure of a cause.  MRI shows stenosis.  She reports that the back pain is much better since the injections.  She reports difficulty with any mobility, at times needing assist to stand from sitting, also her daughter reports holding onto her when walking.  She has shuffling gait, ankles roll in.  TUG time was 42 seconds    History and Personal Factors relevant to plan  of care:  Parkinsons, Chronic pain syndrome, depression    Clinical Presentation  Evolving    Clinical Presentation due to:  had severe pain until Monday and now minimal pain after injections    Clinical Decision Making  Moderate    Rehab Potential  Good    PT Frequency  2x / week    PT Duration  8 weeks    PT Treatment/Interventions  ADLs/Self Care Home Management;Electrical Stimulation;Moist Heat;Balance training;Therapeutic exercise;Therapeutic activities;Functional mobility training;Patient/family education;Manual techniques    PT Next Visit Plan  Add exercises as tolerated, if pain returns will add modalities    Consulted and Agree with Plan of Care  Patient       Patient will benefit from skilled therapeutic intervention in order to improve the following deficits and impairments:  Abnormal gait, Decreased range of motion, Difficulty walking, Cardiopulmonary status limiting activity, Decreased activity tolerance, Decreased balance, Decreased mobility, Decreased strength, Postural dysfunction, Impaired flexibility, Pain, Obesity, Increased muscle spasms  Visit Diagnosis: Acute bilateral thoracic back pain - Plan: PT plan of care cert/re-cert  Difficulty in walking, not elsewhere classified - Plan: PT plan of care cert/re-cert  Muscle weakness (generalized) - Plan: PT plan of care cert/re-cert     Problem List Patient Active Problem List   Diagnosis Date Noted  . Toe pain, right 11/17/2016  . Left shoulder pain 01/19/2016  . Hyperlipidemia 05/03/2014  . Disc disorder of lumbar region 12/30/2013  . Obesity (BMI 30-39.9) 07/01/2013  . Parkinson disease (HCC) 09/17/2012  . Biliary dyskinesia 12/10/2011  . UTI (urinary tract infection) 07/24/2011  . Lumbar back pain 06/12/2011  . Symptomatic Parkinson disease (HCC) 02/26/2011  . Depression 09/28/2010  . Pre-operative cardiovascular examination 07/23/2010  . CAROTID BRUIT, RIGHT 06/29/2009  . CHEST PAIN-UNSPECIFIED 01/13/2009   . SKIN RASH 10/11/2008  . EDEMA 08/25/2008  . OTHER URINARY INCONTINENCE 02/15/2008  . CHRONIC PAIN SYNDROME 12/31/2007  . BRUISE 09/14/2007  . SCOLIOSIS, LUMBAR SPINE 06/15/2007  . BACK PAIN, THORACIC REGION, RIGHT 06/11/2007  . MENISCUS TEAR 02/25/2007  . OTHER SYMPTOMS REFERABLE TO LOWER LEG JOINT 02/17/2007  . MYALGIA 02/10/2007  . CALF PAIN, RIGHT 02/10/2007  . DEPRESSION 01/01/2007  . HYPOKALEMIA, HX OF 01/01/2007  . TINEA CORPORIS 09/22/2006  . ALLERGIC DRUG REACTION, OPIOIDS 09/22/2006  . Essential hypertension 08/20/2006  . OSTEOARTHRITIS 08/20/2006    Jearld LeschALBRIGHT,Bertrice Leder W., PT 04/09/2017, 3:56 PM  [facility] [address] [place], [place], [address] Phone: [phone]   Fax:  [phone]  Name: Sharol GivenJoan M Klann MRN: [id number] Date of Birth: 12/31/1933

## 2017-04-10 ENCOUNTER — Other Ambulatory Visit: Payer: Self-pay | Admitting: Family Medicine

## 2017-04-14 ENCOUNTER — Telehealth (INDEPENDENT_AMBULATORY_CARE_PROVIDER_SITE_OTHER): Payer: Self-pay | Admitting: Specialist

## 2017-04-14 ENCOUNTER — Ambulatory Visit: Payer: Medicare Other | Admitting: Physical Therapy

## 2017-04-14 ENCOUNTER — Other Ambulatory Visit (HOSPITAL_BASED_OUTPATIENT_CLINIC_OR_DEPARTMENT_OTHER): Payer: Self-pay | Admitting: Neurosurgery

## 2017-04-14 DIAGNOSIS — M4315 Spondylolisthesis, thoracolumbar region: Secondary | ICD-10-CM

## 2017-04-14 NOTE — Telephone Encounter (Signed)
Pt daughter called to ask questions about order for ultra sound of right kidney. Pt stated its been a little while and wanted to check in with the office.

## 2017-04-15 ENCOUNTER — Ambulatory Visit: Payer: Medicare Other | Admitting: Physical Therapy

## 2017-04-15 ENCOUNTER — Encounter: Payer: Self-pay | Admitting: Physical Therapy

## 2017-04-15 DIAGNOSIS — M6281 Muscle weakness (generalized): Secondary | ICD-10-CM

## 2017-04-15 DIAGNOSIS — R262 Difficulty in walking, not elsewhere classified: Secondary | ICD-10-CM

## 2017-04-15 DIAGNOSIS — M546 Pain in thoracic spine: Secondary | ICD-10-CM

## 2017-04-15 NOTE — Therapy (Signed)
[facility] [address] [place], [place], [address] Phone: [phone]   Fax:  [phone]  Physical Therapy Treatment  Patient Details  Name: [name] MRN: [id number] Date of Birth: Oct 13, 1933 Referring Provider: Naaman Plummer   Encounter Date: 04/15/2017  PT End of Session - 04/15/17 1009    Visit Number  2    Date for PT Re-Evaluation  06/07/17    PT Start Time  0934    PT Stop Time  1015    PT Time Calculation (min)  41 min       Past Medical History:  Diagnosis Date  . Allergic drug reaction    Opioids  . Back pain, thoracic   . Benign familial tremor   . Bruise   . Calf pain   . Chest pain    -- Myoview negative 2007  . Chronic pain syndrome   . Depression   . Edema   . Gallstones   . Hypertension   . Hypokalemia   . Meniscus tear   . Myalgia   . Osteoarthritis   . Other symptoms referable to lower leg joint   . Other urinary incontinence   . Scoliosis   . Skin rash   . Tinea corporis     Past Surgical History:  Procedure Laterality Date  . ABDOMINAL HYSTERECTOMY    . BREAST LUMPECTOMY    . KNEE ARTHROSCOPY     Bilateral  . LUMBAR LAMINECTOMY/DECOMPRESSION MICRODISCECTOMY  2007   L3-4,4-5, L5hemilaminectomy with leopard cage internal fiaxation L3-5  . ROTATOR CUFF REPAIR     Right    There were no vitals filed for this visit.  Subjective Assessment - 04/15/17 0934    Subjective  "So far so good"    Currently in Pain?  Yes    Pain Score  3     Pain Location  Back    Pain Orientation  Right;Mid    Pain Type  Acute pain                      [facility] Adult PT Treatment/Exercise - 04/15/17 0001      Exercises   Exercises  Lumbar      Lumbar Exercises: Aerobic   Nustep  L2 x 5 min       Lumbar Exercises: Standing   Other Standing Lumbar Exercises  Standing march with RW 2x5 each      Lumbar Exercises: Seated   Long Arc Quad on Chair  Both;2 sets;10 reps     Other Seated Lumbar Exercises  HS red 2x10; Seated Rows red tband 2x10; shoulder ext yellow tband 2x10     Other Seated Lumbar Exercises  seated march 2x10; ball squeezes 2x10;  forwatrd reaches with balll                PT Short Term Goals - 04/15/17 0939      PT SHORT TERM GOAL #1   Status  Achieved        PT Long Term Goals - 04/09/17 1553      PT LONG TERM GOAL #1   Title  report minimal pain iwth ADL's    Time  8    Period  Weeks    Status  New      PT LONG TERM GOAL #2   Title  increase LE strength to 4/5    Time  8    Period  Weeks    Status  New      PT LONG TERM GOAL #3   Title  decrease TUG time to 28 seconds    Period  Weeks    Status  New      PT LONG TERM GOAL #4   Title  get up from sitting without difficulty    Time  8    Period  Weeks    Status  New            Plan - 04/15/17 1009    Clinical Impression Statement  Pt tolerated an initial progression to seated interventions well, increase rest needed between exercises due to pt fatigue. No reports of pain with either seated or standing exercises.     Rehab Potential  Good    PT Frequency  2x / week    PT Duration  8 weeks    PT Treatment/Interventions  ADLs/Self Care Home Management;Electrical Stimulation;Moist Heat;Balance training;Therapeutic exercise;Therapeutic activities;Functional mobility training;Patient/family education;Manual techniques    PT Next Visit Plan  Add exercises as tolerated, if pain returns will add modalities       Patient will benefit from skilled therapeutic intervention in order to improve the following deficits and impairments:  Abnormal gait, Decreased range of motion, Difficulty walking, Cardiopulmonary status limiting activity, Decreased activity tolerance, Decreased balance, Decreased mobility, Decreased strength, Postural dysfunction, Impaired flexibility, Pain, Obesity, Increased muscle spasms  Visit Diagnosis: Acute bilateral thoracic back  pain  Difficulty in walking, not elsewhere classified  Muscle weakness (generalized)     Problem List Patient Active Problem List   Diagnosis Date Noted  . Toe pain, right 11/17/2016  . Left shoulder pain 01/19/2016  . Hyperlipidemia 05/03/2014  . Disc disorder of lumbar region 12/30/2013  . Obesity (BMI 30-39.9) 07/01/2013  . Parkinson disease (HCC) 09/17/2012  . Biliary dyskinesia 12/10/2011  . UTI (urinary tract infection) 07/24/2011  . Lumbar back pain 06/12/2011  . Symptomatic Parkinson disease (HCC) 02/26/2011  . Depression 09/28/2010  . Pre-operative cardiovascular examination 07/23/2010  . CAROTID BRUIT, RIGHT 06/29/2009  . CHEST PAIN-UNSPECIFIED 01/13/2009  . SKIN RASH 10/11/2008  . EDEMA 08/25/2008  . OTHER URINARY INCONTINENCE 02/15/2008  . CHRONIC PAIN SYNDROME 12/31/2007  . BRUISE 09/14/2007  . SCOLIOSIS, LUMBAR SPINE 06/15/2007  . BACK PAIN, THORACIC REGION, RIGHT 06/11/2007  . MENISCUS TEAR 02/25/2007  . OTHER SYMPTOMS REFERABLE TO LOWER LEG JOINT 02/17/2007  . MYALGIA 02/10/2007  . CALF PAIN, RIGHT 02/10/2007  . DEPRESSION 01/01/2007  . HYPOKALEMIA, HX OF 01/01/2007  . TINEA CORPORIS 09/22/2006  . ALLERGIC DRUG REACTION, OPIOIDS 09/22/2006  . Essential hypertension 08/20/2006  . OSTEOARTHRITIS 08/20/2006    Grayce Sessionsonald G Dechelle Attaway, PTA 04/15/2017, 10:11 AM  [facility] [address] [place], [place], [address] Phone: [phone]   Fax:  [phone]  Name: [name] MRN: [id number] Date of Birth: 03/13/1934

## 2017-04-16 ENCOUNTER — Ambulatory Visit: Payer: Medicare Other | Admitting: Physical Therapy

## 2017-04-16 NOTE — Telephone Encounter (Signed)
Pt daughter called to ask questions about order for ultra sound of right kidney. Pt stated its been a little while and wanted to check in with the office.---Please advise--No order in chart for this.

## 2017-04-17 ENCOUNTER — Ambulatory Visit: Payer: Medicare Other | Admitting: Physical Therapy

## 2017-04-17 ENCOUNTER — Encounter: Payer: Self-pay | Admitting: Physical Therapy

## 2017-04-17 DIAGNOSIS — M546 Pain in thoracic spine: Secondary | ICD-10-CM

## 2017-04-17 DIAGNOSIS — R262 Difficulty in walking, not elsewhere classified: Secondary | ICD-10-CM

## 2017-04-17 DIAGNOSIS — M6281 Muscle weakness (generalized): Secondary | ICD-10-CM

## 2017-04-17 NOTE — Therapy (Signed)
[facility] [address] [place], [place], [address] Phone: [phone]   Fax:  [phone]  Physical Therapy Treatment  Patient Details  Name: [name] MRN: [id number] Date of Birth: 06/26/1933 Referring Provider: Naaman PlummerFred Newton   Encounter Date: 04/17/2017  [name] End of Session - 04/17/17 1533    Visit Number  3    Date for [name] Re-Evaluation  06/07/17    [name] Start Time  1356    [name] Stop Time  1445    [name] Time Calculation (min)  49 min    Activity Tolerance  Patient tolerated treatment well    Behavior During Therapy  [name] for tasks assessed/performed       Past Medical History:  Diagnosis Date  . Allergic drug reaction    Opioids  . Back pain, thoracic   . Benign familial tremor   . Bruise   . Calf pain   . Chest pain    -- Myoview negative 2007  . Chronic pain syndrome   . Depression   . Edema   . Gallstones   . Hypertension   . Hypokalemia   . Meniscus tear   . Myalgia   . Osteoarthritis   . Other symptoms referable to lower leg joint   . Other urinary incontinence   . Scoliosis   . Skin rash   . Tinea corporis     Past Surgical History:  Procedure Laterality Date  . ABDOMINAL HYSTERECTOMY    . BREAST LUMPECTOMY    . KNEE ARTHROSCOPY     Bilateral  . LUMBAR LAMINECTOMY/DECOMPRESSION MICRODISCECTOMY  2007   L3-4,4-5, L5hemilaminectomy with leopard cage internal fiaxation L3-5  . ROTATOR CUFF REPAIR     Right    There were no vitals filed for this visit.  Subjective Assessment - 04/17/17 1352    Subjective  "My knees were a little sore"    Currently in Pain?  No/denies                      [name] Treatment/Exercise - 04/17/17 0001      Ambulation/Gait   Gait Comments  HHA 60'x2 cues to pick up fet, look up for posture and for faster speed  (Pended)       Lumbar Exercises: Aerobic   UBE (Upper Arm Bike)  Level 2 x 4 minutes 2FW/2BKW  (Pended)     Nustep  L2 x 6  min   (Pended)       Lumbar Exercises: Standing   Other Standing Lumbar Exercises  Standing march with RW with 1.5# on ankles with her toe touching a 4" step  (Pended)       Lumbar Exercises: Seated   Long Arc Quad on Chair  Both;2 sets;10 reps;Weights  (Pended)     LAQ on Chair Weights (lbs)  1.5  (Pended)     Other Seated Lumbar Exercises  HS red 2x10; Seated Rows red tband 2x10; shoulder ext yellow tband 2x10   (Pended)     Other Seated Lumbar Exercises  seated march 2x10; ball squeezes 2x10; isometric abdominal ball squeeze, weighted ball lift and rotation  (Pended)                [name] Short Term Goals - 04/17/17 1535      [name] SHORT TERM GOAL #1   Title  independent with initial HEP    Status  Achieved  [name] Long Term Goals - 04/17/17 1535      [name] LONG TERM GOAL #1   Title  report minimal pain iwth ADL's    Status  On-going            Plan - 04/17/17 1534    Clinical Impression Statement  Patient needs cues to breath during exercises and with walking, needs cues to help with posture during exercises, she also tends to drag her feet with gait, I have explained how this increases her risk for falling by tripping.      [name] Next Visit Plan  Add exercises as tolerated, check TUG    Consulted and Agree with Plan of Care  Patient       Patient will benefit from skilled therapeutic intervention in order to improve the following deficits and impairments:  Abnormal gait, Decreased range of motion, Difficulty walking, Cardiopulmonary status limiting activity, Decreased activity tolerance, Decreased balance, Decreased mobility, Decreased strength, Postural dysfunction, Impaired flexibility, Pain, Obesity, Increased muscle spasms  Visit Diagnosis: Acute bilateral thoracic back pain  Difficulty in walking, not elsewhere classified  Muscle weakness (generalized)     Problem List Patient Active Problem List   Diagnosis Date Noted  . Toe pain, right 11/17/2016  .  Left shoulder pain 01/19/2016  . Hyperlipidemia 05/03/2014  . Disc disorder of lumbar region 12/30/2013  . Obesity (BMI 30-39.9) 07/01/2013  . Parkinson disease (HCC) 09/17/2012  . Biliary dyskinesia 12/10/2011  . UTI (urinary tract infection) 07/24/2011  . Lumbar back pain 06/12/2011  . Symptomatic Parkinson disease (HCC) 02/26/2011  . Depression 09/28/2010  . Pre-operative cardiovascular examination 07/23/2010  . CAROTID BRUIT, RIGHT 06/29/2009  . CHEST PAIN-UNSPECIFIED 01/13/2009  . SKIN RASH 10/11/2008  . EDEMA 08/25/2008  . OTHER URINARY INCONTINENCE 02/15/2008  . CHRONIC PAIN SYNDROME 12/31/2007  . BRUISE 09/14/2007  . SCOLIOSIS, LUMBAR SPINE 06/15/2007  . BACK PAIN, THORACIC REGION, RIGHT 06/11/2007  . MENISCUS TEAR 02/25/2007  . OTHER SYMPTOMS REFERABLE TO LOWER LEG JOINT 02/17/2007  . MYALGIA 02/10/2007  . CALF PAIN, RIGHT 02/10/2007  . DEPRESSION 01/01/2007  . HYPOKALEMIA, HX OF 01/01/2007  . TINEA CORPORIS 09/22/2006  . ALLERGIC DRUG REACTION, OPIOIDS 09/22/2006  . Essential hypertension 08/20/2006  . OSTEOARTHRITIS 08/20/2006    [name] Lesch., [name] 04/17/2017, 3:36 PM  [facility] [address] [place], [place], [address] Phone: [phone]   Fax:  [phone]  Name: [name] MRN: [id number] Date of Birth: 10/18/1933

## 2017-04-18 ENCOUNTER — Ambulatory Visit (HOSPITAL_BASED_OUTPATIENT_CLINIC_OR_DEPARTMENT_OTHER)
Admission: RE | Admit: 2017-04-18 | Discharge: 2017-04-18 | Disposition: A | Discharge status: Home / Self Care | Payer: Medicare Other | Source: Ambulatory Visit | Attending: Neurosurgery | Admitting: Neurosurgery

## 2017-04-18 DIAGNOSIS — M4314 Spondylolisthesis, thoracic region: Secondary | ICD-10-CM | POA: Insufficient documentation

## 2017-04-18 DIAGNOSIS — M4184 Other forms of scoliosis, thoracic region: Secondary | ICD-10-CM | POA: Insufficient documentation

## 2017-04-18 DIAGNOSIS — M47814 Spondylosis without myelopathy or radiculopathy, thoracic region: Secondary | ICD-10-CM | POA: Diagnosis not present

## 2017-04-18 DIAGNOSIS — M4315 Spondylolisthesis, thoracolumbar region: Secondary | ICD-10-CM

## 2017-04-18 DIAGNOSIS — I700 Atherosclerosis of aorta: Secondary | ICD-10-CM | POA: Diagnosis not present

## 2017-04-18 DIAGNOSIS — R937 Abnormal findings on diagnostic imaging of other parts of musculoskeletal system: Secondary | ICD-10-CM | POA: Diagnosis not present

## 2017-04-21 ENCOUNTER — Encounter: Payer: Self-pay | Admitting: Physical Therapy

## 2017-04-21 ENCOUNTER — Ambulatory Visit: Payer: Medicare Other | Attending: Specialist | Admitting: Physical Therapy

## 2017-04-21 DIAGNOSIS — M6281 Muscle weakness (generalized): Secondary | ICD-10-CM | POA: Insufficient documentation

## 2017-04-21 DIAGNOSIS — R262 Difficulty in walking, not elsewhere classified: Secondary | ICD-10-CM | POA: Diagnosis present

## 2017-04-21 DIAGNOSIS — M546 Pain in thoracic spine: Secondary | ICD-10-CM | POA: Insufficient documentation

## 2017-04-21 NOTE — Therapy (Signed)
[facility] [address] [place], [place], [address] Phone: [phone]   Fax:  [phone]  Physical Therapy Treatment  Patient Details  Name: [name] MRN: [id number] Date of Birth: October 24, 1933 Referring Provider: Naaman Plummer   Encounter Date: 04/21/2017  PT End of Session - 04/21/17 1519    Visit Number  4    Date for PT Re-Evaluation  06/07/17    PT Start Time  1430    PT Stop Time  1519    PT Time Calculation (min)  49 min    Activity Tolerance  Patient tolerated treatment well    Behavior During Therapy  [facility] for tasks assessed/performed       Past Medical History:  Diagnosis Date  . Allergic drug reaction    Opioids  . Back pain, thoracic   . Benign familial tremor   . Bruise   . Calf pain   . Chest pain    -- Myoview negative 2007  . Chronic pain syndrome   . Depression   . Edema   . Gallstones   . Hypertension   . Hypokalemia   . Meniscus tear   . Myalgia   . Osteoarthritis   . Other symptoms referable to lower leg joint   . Other urinary incontinence   . Scoliosis   . Skin rash   . Tinea corporis     Past Surgical History:  Procedure Laterality Date  . ABDOMINAL HYSTERECTOMY    . BREAST LUMPECTOMY    . KNEE ARTHROSCOPY     Bilateral  . LUMBAR LAMINECTOMY/DECOMPRESSION MICRODISCECTOMY  2007   L3-4,4-5, L5hemilaminectomy with leopard cage internal fiaxation L3-5  . ROTATOR CUFF REPAIR     Right    There were no vitals filed for this visit.  Subjective Assessment - 04/21/17 1436    Subjective  "I am doing all right"    Currently in Pain?  No/denies    Pain Score  0-No pain                      [facility] Adult PT Treatment/Exercise - 04/21/17 0001      Ambulation/Gait   Gait Comments  HHA 20'x  fron and backwards.cues to pick up fet, look up for posture and for faster speed      Lumbar Exercises: Aerobic   UBE (Upper Arm Bike)  Level 2 x 4 minutes 2FW/2BKW     Nustep  L2 x 6 min       Lumbar Exercises: Standing   Other Standing Lumbar Exercises  Standing march with RW with 1.5# on ankles with her toe touching a 4" step      Lumbar Exercises: Seated   Long Arc Quad on Chair  Both;2 sets;10 reps;Weights    LAQ on Chair Weights (lbs)  1.5    Other Seated Lumbar Exercises  HS curls red 2x15; Seated Rows red tband 2x15; shoulder ext yellow tband 2x10     Other Seated Lumbar Exercises  seated march 1.5 2x10;                PT Short Term Goals - 04/17/17 1535      PT SHORT TERM GOAL #1   Title  independent with initial HEP    Status  Achieved        PT Long Term Goals - 04/17/17 1535      PT LONG TERM GOAL #1  Title  report minimal pain iwth ADL's    Status  On-going            Plan - 04/21/17 1521    Clinical Impression Statement  Pt becomes very fatigue with standing activities, Postural cues needed with backwards walking not to lean back against HHA. Cues to pick up feet when walking, pt tends to drag feet.     Rehab Potential  Good    PT Frequency  2x / week    PT Duration  8 weeks    PT Treatment/Interventions  ADLs/Self Care Home Management;Electrical Stimulation;Moist Heat;Balance training;Therapeutic exercise;Therapeutic activities;Functional mobility training;Patient/family education;Manual techniques    PT Next Visit Plan  Add exercises as tolerated, check TUG       Patient will benefit from skilled therapeutic intervention in order to improve the following deficits and impairments:  Abnormal gait, Decreased range of motion, Difficulty walking, Cardiopulmonary status limiting activity, Decreased activity tolerance, Decreased balance, Decreased mobility, Decreased strength, Postural dysfunction, Impaired flexibility, Pain, Obesity, Increased muscle spasms  Visit Diagnosis: Difficulty in walking, not elsewhere classified  Acute bilateral thoracic back pain  Muscle weakness (generalized)     Problem  List Patient Active Problem List   Diagnosis Date Noted  . Toe pain, right 11/17/2016  . Left shoulder pain 01/19/2016  . Hyperlipidemia 05/03/2014  . Disc disorder of lumbar region 12/30/2013  . Obesity (BMI 30-39.9) 07/01/2013  . Parkinson disease (HCC) 09/17/2012  . Biliary dyskinesia 12/10/2011  . UTI (urinary tract infection) 07/24/2011  . Lumbar back pain 06/12/2011  . Symptomatic Parkinson disease (HCC) 02/26/2011  . Depression 09/28/2010  . Pre-operative cardiovascular examination 07/23/2010  . CAROTID BRUIT, RIGHT 06/29/2009  . CHEST PAIN-UNSPECIFIED 01/13/2009  . SKIN RASH 10/11/2008  . EDEMA 08/25/2008  . OTHER URINARY INCONTINENCE 02/15/2008  . CHRONIC PAIN SYNDROME 12/31/2007  . BRUISE 09/14/2007  . SCOLIOSIS, LUMBAR SPINE 06/15/2007  . BACK PAIN, THORACIC REGION, RIGHT 06/11/2007  . MENISCUS TEAR July 07, 202008  . OTHER SYMPTOMS REFERABLE TO LOWER LEG JOINT 02/17/2007  . MYALGIA 02/10/2007  . CALF PAIN, RIGHT 02/10/2007  . DEPRESSION 01/01/2007  . HYPOKALEMIA, HX OF 01/01/2007  . TINEA CORPORIS 09/22/2006  . ALLERGIC DRUG REACTION, OPIOIDS 09/22/2006  . Essential hypertension 08/20/2006  . OSTEOARTHRITIS 08/20/2006    Grayce Sessionsonald G Panda Crossin, PTA 04/21/2017, 3:24 PM  [facility] [address] [place], [place], [address] Phone: [phone]   Fax:  [phone]  Name: Sharol GivenJoan M Estock MRN: [id number] Date of Birth: 05/15/1933

## 2017-04-23 ENCOUNTER — Encounter (INDEPENDENT_AMBULATORY_CARE_PROVIDER_SITE_OTHER): Payer: Self-pay | Admitting: Specialist

## 2017-04-23 ENCOUNTER — Ambulatory Visit (INDEPENDENT_AMBULATORY_CARE_PROVIDER_SITE_OTHER): Payer: Medicare Other | Admitting: Specialist

## 2017-04-23 VITALS — BP 121/72 | HR 61 | Ht 65.0 in | Wt 281.0 lb

## 2017-04-23 DIAGNOSIS — N2889 Other specified disorders of kidney and ureter: Secondary | ICD-10-CM | POA: Diagnosis not present

## 2017-04-23 DIAGNOSIS — M4724 Other spondylosis with radiculopathy, thoracic region: Secondary | ICD-10-CM | POA: Diagnosis not present

## 2017-04-23 NOTE — Patient Instructions (Signed)
Avoid bending, stooping and avoid lifting weights greater than 10 lbs. Avoid prolong standing and walking. Avoid frequent bending and stooping  No lifting greater than 10 lbs. May use ice or moist heat for pain. Weight loss is of benefit. Handicap license is approved.  

## 2017-04-23 NOTE — Progress Notes (Signed)
Office Visit Note   Patient: [name]           Date of Birth: [birth year]           MRN: [id number] Visit Date: 04/23/2017              Requested by: [address] [place], [place] [address] [place], [place] [address] PCP: Zola Button, Grayling Congress, DO   Assessment & Plan: Visit Diagnoses:  1. Renal mass, right   2. Osteoarthritis of spine with radiculopathy, thoracic region     Plan: Avoid bending, stooping and avoid lifting weights greater than 10 lbs. Avoid prolong standing and walking. Avoid frequent bending and stooping  No lifting greater than 10 lbs. May use ice or moist heat for pain. Weight loss is of benefit. Handicap license is approved.  Follow-Up Instructions: Return in about 4 weeks (around 05/21/2017).   Orders:  Orders Placed This Encounter  Procedures  . US RENAL   No orders of the defined types were placed in this encounter.     Procedures: No procedures performed   Clinical Data: No additional findings.   Subjective: Chief Complaint  Patient presents with  . Lower Back - Follow-up    CT review of LSP    HPI  Review of Systems   Objective: Vital Signs: BP 121/72 (BP Location: Left Arm, Patient Position: Sitting)   Pulse 61   Ht 5\' 5"  (1.651 m)   Wt 281 lb (127.5 kg)   BMI 46.76 kg/m   Physical Exam  Ortho Exam  Specialty Comments:  No specialty comments available.  Imaging: No results found.   PMFS History: Patient Active Problem List   Diagnosis Date Noted  . Toe pain, right 11/17/2016  . Left shoulder pain 01/19/2016  . Hyperlipidemia 05/03/2014  . Disc disorder of lumbar region 12/30/2013  . Obesity (BMI 30-39.9) 07/01/2013  . Parkinson disease (HCC) 09/17/2012  . Biliary dyskinesia 12/10/2011  . UTI (urinary tract infection) 07/24/2011  . Lumbar back pain 06/12/2011  . Symptomatic Parkinson disease (HCC) 02/26/2011  . Depression 09/28/2010  . Pre-operative cardiovascular examination 07/23/2010   . CAROTID BRUIT, RIGHT 06/29/2009  . CHEST PAIN-UNSPECIFIED 01/13/2009  . SKIN RASH 10/11/2008  . EDEMA 08/25/2008  . OTHER URINARY INCONTINENCE 02/15/2008  . CHRONIC PAIN SYNDROME 12/31/2007  . BRUISE 09/14/2007  . SCOLIOSIS, LUMBAR SPINE 06/15/2007  . BACK PAIN, THORACIC REGION, RIGHT 06/11/2007  . MENISCUS TEAR 02/25/2007  . OTHER SYMPTOMS REFERABLE TO LOWER LEG JOINT 02/17/2007  . MYALGIA 02/10/2007  . CALF PAIN, RIGHT 02/10/2007  . DEPRESSION 01/01/2007  . HYPOKALEMIA, HX OF 01/01/2007  . TINEA CORPORIS 09/22/2006  . ALLERGIC DRUG REACTION, OPIOIDS 09/22/2006  . Essential hypertension 08/20/2006  . OSTEOARTHRITIS 08/20/2006   Past Medical History:  Diagnosis Date  . Allergic drug reaction    Opioids  . Back pain, thoracic   . Benign familial tremor   . Bruise   . Calf pain   . Chest pain    -- Myoview negative 2007  . Chronic pain syndrome   . Depression   . Edema   . Gallstones   . Hypertension   . Hypokalemia   . Meniscus tear   . Myalgia   . Osteoarthritis   . Other symptoms referable to lower leg joint   . Other urinary incontinence   . Scoliosis   . Skin rash   . Tinea corporis     Family History  Problem Relation Age of Onset  . Stroke Mother   . Parkinsonism Mother     Past Surgical History:  Procedure Laterality Date  . ABDOMINAL HYSTERECTOMY    . BREAST LUMPECTOMY    . KNEE ARTHROSCOPY     Bilateral  . LUMBAR LAMINECTOMY/DECOMPRESSION MICRODISCECTOMY  2007   L3-4,4-5, L5hemilaminectomy with leopard cage internal fiaxation L3-5  . ROTATOR CUFF REPAIR     Right   Social History   Occupational History  . Occupation: Midwiferetired--administator    Employer: [employer]  Tobacco Use  . Smoking status: Never Smoker  . Smokeless tobacco: Never Used  Substance and Sexual Activity  . Alcohol use: No  . Drug use: No  . Sexual activity: No    Partners: Male

## 2017-04-24 ENCOUNTER — Ambulatory Visit: Payer: Medicare Other | Admitting: Physical Therapy

## 2017-04-24 ENCOUNTER — Ambulatory Visit (HOSPITAL_BASED_OUTPATIENT_CLINIC_OR_DEPARTMENT_OTHER)
Admission: RE | Admit: 2017-04-24 | Discharge: 2017-04-24 | Disposition: A | Discharge status: Home / Self Care | Payer: Medicare Other | Source: Ambulatory Visit | Attending: Specialist | Admitting: Specialist

## 2017-04-24 DIAGNOSIS — M546 Pain in thoracic spine: Secondary | ICD-10-CM

## 2017-04-24 DIAGNOSIS — M6281 Muscle weakness (generalized): Secondary | ICD-10-CM

## 2017-04-24 DIAGNOSIS — R262 Difficulty in walking, not elsewhere classified: Secondary | ICD-10-CM

## 2017-04-24 DIAGNOSIS — N281 Cyst of kidney, acquired: Secondary | ICD-10-CM | POA: Insufficient documentation

## 2017-04-24 DIAGNOSIS — N2889 Other specified disorders of kidney and ureter: Secondary | ICD-10-CM

## 2017-04-24 NOTE — Therapy (Signed)
[place], [place], [address] Phone: [phone]   Fax:  [phone]  Physical Therapy Treatment  Patient Details  Name: [name] MRN: [id number] Date of Birth: [birth year] Referring Provider: Laurence Spates   Encounter Date: 04/24/2017  PT End of Session - 04/24/17 1400    Visit Number  5    Date for PT Re-Evaluation  06/07/17    PT Start Time  1400    PT Stop Time  1442    PT Time Calculation (min)  42 min       Past Medical History:  Diagnosis Date  . Allergic drug reaction    Opioids  . Back pain, thoracic   . Benign familial tremor   . Bruise   . Calf pain   . Chest pain    -- Myoview negative 2007  . Chronic pain syndrome   . Depression   . Edema   . Gallstones   . Hypertension   . Hypokalemia   . Meniscus tear   . Myalgia   . Osteoarthritis   . Other symptoms referable to lower leg joint   . Other urinary incontinence   . Scoliosis   . Skin rash   . Tinea corporis     Past Surgical History:  Procedure Laterality Date  . ABDOMINAL HYSTERECTOMY    . BREAST LUMPECTOMY    . KNEE ARTHROSCOPY     Bilateral  . LUMBAR LAMINECTOMY/DECOMPRESSION MICRODISCECTOMY  2007   L3-4,4-5, L5hemilaminectomy with leopard cage internal fiaxation L3-5  . ROTATOR CUFF REPAIR     Right    There were no vitals filed for this visit.  Subjective Assessment - 04/24/17 1358    Subjective  doing alright    Currently in Pain?  No/denies         [facility] PT Assessment - 04/24/17 0001      Strength   Overall Strength Comments  hips and knees 4-/5                  [facility] Adult PT Treatment/Exercise - 04/24/17 0001      Ambulation/Gait   Gait Comments  SPC and HHA 100 feet at end of session , working in increased stride vs shuffle step      Lumbar Exercises: Aerobic   UBE (Upper Arm Bike)  Level 3x 4 minutes 2FW/2BKW    Nustep  L3 x 6 min       Lumbar Exercises: Standing    Other Standing Lumbar Exercises  standing HHA marching , hip 3 way 10 each 2#      Lumbar Exercises: Seated   Long Arc Quad on Chair  Both;2 sets;10 reps;Weights    LAQ on Chair Weights (lbs)  2#    Sit to Stand  10 reps with wt ball    Other Seated Lumbar Exercises  seated march and hip abd 2# 2x10;                PT Short Term Goals - 04/17/17 1535      PT SHORT TERM GOAL #1   Title  independent with initial HEP    Status  Achieved        PT Long Term Goals - 04/24/17 1401      PT LONG TERM GOAL #1   Title  report minimal pain iwth ADL's    Status  Partially Met      PT LONG  TERM GOAL #2   Title  increase LE strength to 4/5    Status  On-going      PT LONG TERM GOAL #3   Title  decrease TUG time to 28 seconds    Baseline  26 sec with SPC    Status  Achieved      PT LONG TERM GOAL #4   Title  get up from sitting without difficulty    Status  On-going            Plan - 04/24/17 1441    Clinical Impression Statement  pt verb amb in 180 feet with SPC with 1 standing rest break, required HHA with SPC to amb 100 feet en dof session with shuffled gait. fatigue with standing ex . TUG goal met    PT Treatment/Interventions  ADLs/Self Care Home Management;Electrical Stimulation;Moist Heat;Balance training;Therapeutic exercise;Therapeutic activities;Functional mobility training;Patient/family education;Manual techniques    PT Next Visit Plan  progress strengthening and gait       Patient will benefit from skilled therapeutic intervention in order to improve the following deficits and impairments:  Abnormal gait, Decreased range of motion, Difficulty walking, Cardiopulmonary status limiting activity, Decreased activity tolerance, Decreased balance, Decreased mobility, Decreased strength, Postural dysfunction, Impaired flexibility, Pain, Obesity, Increased muscle spasms  Visit Diagnosis: Difficulty in walking, not elsewhere classified  Acute bilateral thoracic  back pain  Muscle weakness (generalized)     Problem List Patient Active Problem List   Diagnosis Date Noted  . Toe pain, right 11/17/2016  . Left shoulder pain 01/19/2016  . Hyperlipidemia 05/03/2014  . Disc disorder of lumbar region 12/30/2013  . Obesity (BMI 30-39.9) 07/01/2013  . Parkinson disease ([place]) 09/17/2012  . Biliary dyskinesia 12/10/2011  . UTI (urinary tract infection) 07/24/2011  . Lumbar back pain 06/12/2011  . Symptomatic Parkinson disease ([place]) 02/26/2011  . Depression 09/28/2010  . Pre-operative cardiovascular examination 07/23/2010  . CAROTID BRUIT, RIGHT 06/29/2009  . CHEST PAIN-UNSPECIFIED 01/13/2009  . SKIN RASH 10/11/2008  . EDEMA 08/25/2008  . OTHER URINARY INCONTINENCE 02/15/2008  . CHRONIC PAIN SYNDROME 12/31/2007  . BRUISE 09/14/2007  . SCOLIOSIS, LUMBAR SPINE 06/15/2007  . BACK PAIN, THORACIC REGION, RIGHT 06/11/2007  . MENISCUS TEAR 02/25/2007  . OTHER SYMPTOMS REFERABLE TO LOWER LEG JOINT 02/17/2007  . MYALGIA 02/10/2007  . CALF PAIN, RIGHT 02/10/2007  . DEPRESSION 01/01/2007  . HYPOKALEMIA, HX OF 01/01/2007  . TINEA CORPORIS 09/22/2006  . ALLERGIC DRUG REACTION, OPIOIDS 09/22/2006  . Essential hypertension 08/20/2006  . OSTEOARTHRITIS 08/20/2006    [name],ANGIE PTA 04/24/2017, 2:44 PM  [place] [address] [place], [place], [address] Phone: [phone]   Fax:  [phone]  Name: [name] MRN: [id number] Date of Birth: [birth year]

## 2017-04-29 ENCOUNTER — Encounter: Payer: Self-pay | Admitting: Physical Therapy

## 2017-04-29 ENCOUNTER — Ambulatory Visit: Payer: Medicare Other | Admitting: Physical Therapy

## 2017-04-29 DIAGNOSIS — R262 Difficulty in walking, not elsewhere classified: Secondary | ICD-10-CM | POA: Diagnosis not present

## 2017-04-29 DIAGNOSIS — M6281 Muscle weakness (generalized): Secondary | ICD-10-CM

## 2017-04-29 DIAGNOSIS — M546 Pain in thoracic spine: Secondary | ICD-10-CM

## 2017-04-29 NOTE — Therapy (Signed)
[place], [place], [address] Phone: [phone]   Fax:  [phone]  Physical Therapy Treatment  Patient Details  Name: [name] MRN: [id number] Date of Birth: Jun 27, 1933 Referring Provider: Laurence Spates   Encounter Date: 04/29/2017  PT End of Session - 04/29/17 1446    Visit Number  6    Date for PT Re-Evaluation  06/07/17    PT Start Time  3903    PT Stop Time  1446    PT Time Calculation (min)  50 min    Activity Tolerance  Treatment limited secondary to medical complications (Comment)    Behavior During Therapy  [facility] for tasks assessed/performed       Past Medical History:  Diagnosis Date  . Allergic drug reaction    Opioids  . Back pain, thoracic   . Benign familial tremor   . Bruise   . Calf pain   . Chest pain    -- Myoview negative 2007  . Chronic pain syndrome   . Depression   . Edema   . Gallstones   . Hypertension   . Hypokalemia   . Meniscus tear   . Myalgia   . Osteoarthritis   . Other symptoms referable to lower leg joint   . Other urinary incontinence   . Scoliosis   . Skin rash   . Tinea corporis     Past Surgical History:  Procedure Laterality Date  . ABDOMINAL HYSTERECTOMY    . BREAST LUMPECTOMY    . KNEE ARTHROSCOPY     Bilateral  . LUMBAR LAMINECTOMY/DECOMPRESSION MICRODISCECTOMY  2007   L3-4,4-5, L5hemilaminectomy with leopard cage internal fiaxation L3-5  . ROTATOR CUFF REPAIR     Right    There were no vitals filed for this visit.  Subjective Assessment - 04/29/17 1355    Subjective  "So far so good." Pt. reports being tired and a little sore after last tx but no pain.     Currently in Pain?  No/denies    Pain Score  0-No pain                      [facility] Adult PT Treatment/Exercise - 04/29/17 0001      Ambulation/Gait   Gait Comments  SPC HHA down hallway at end of session cues for increase in stride length, looking up  and picking up feet      Lumbar Exercises: Aerobic   UBE (Upper Arm Bike)  Level 3x 4 minutes 2FW/2BKW    Nustep  L3 x 6 min       Lumbar Exercises: Standing   Other Standing Lumbar Exercises  standing marching, ABD, ext 2# 2x10 each with HHA      Lumbar Exercises: Seated   Long Arc Quad on Chair  Both;2 sets;10 reps;Weights    LAQ on Chair Weights (lbs)  2#    Sit to Stand  10 reps with red wt ball from mat    Other Seated Lumbar Exercises  ball squeeze 2x10, rows 2x10 red tband, shoulder ext 2x10 yelllow tband, forward raises with red wt ball    Other Seated Lumbar Exercises  seated march and hip abd up onto 4" step 2# 2x10;                PT Short Term Goals - 04/17/17 1535      PT SHORT TERM GOAL #1   Title  independent with initial HEP    Status  Achieved        PT Long Term Goals - 04/24/17 1401      PT LONG TERM GOAL #1   Title  report minimal pain iwth ADL's    Status  Partially Met      PT LONG TERM GOAL #2   Title  increase LE strength to 4/5    Status  On-going      PT LONG TERM GOAL #3   Title  decrease TUG time to 28 seconds    Baseline  26 sec with SPC    Status  Achieved      PT LONG TERM GOAL #4   Title  get up from sitting without difficulty    Status  On-going            Plan - 04/29/17 1446    Clinical Impression Statement  Pt. tolerated tx. Pt. did well with sit to stands off of mat holding red wt ball. Pt. amb down hallway with SPC and HHA at end of session needing cues to increase stride length and not shuffle feet and to look up when walking. Pt. was fatigued and needed to take a standing break with about 20 feet left to go. Pt. did well with all HHA ex.    Rehab Potential  Good    PT Frequency  2x / week    PT Duration  8 weeks    PT Treatment/Interventions  ADLs/Self Care Home Management;Electrical Stimulation;Moist Heat;Balance training;Therapeutic exercise;Therapeutic activities;Functional mobility training;Patient/family  education;Manual techniques    Consulted and Agree with Plan of Care  Patient       Patient will benefit from skilled therapeutic intervention in order to improve the following deficits and impairments:  Abnormal gait, Decreased range of motion, Difficulty walking, Cardiopulmonary status limiting activity, Decreased activity tolerance, Decreased balance, Decreased mobility, Decreased strength, Postural dysfunction, Impaired flexibility, Pain, Obesity, Increased muscle spasms  Visit Diagnosis: Difficulty in walking, not elsewhere classified  Acute bilateral thoracic back pain  Muscle weakness (generalized)     Problem List Patient Active Problem List   Diagnosis Date Noted  . Toe pain, right 11/17/2016  . Left shoulder pain 01/19/2016  . Hyperlipidemia 05/03/2014  . Disc disorder of lumbar region 12/30/2013  . Obesity (BMI 30-39.9) 07/01/2013  . Parkinson disease ([place]) 09/17/2012  . Biliary dyskinesia 12/10/2011  . UTI (urinary tract infection) 07/24/2011  . Lumbar back pain 06/12/2011  . Symptomatic Parkinson disease ([place]) 02/26/2011  . Depression 09/28/2010  . Pre-operative cardiovascular examination 07/23/2010  . CAROTID BRUIT, RIGHT 06/29/2009  . CHEST PAIN-UNSPECIFIED 01/13/2009  . SKIN RASH 10/11/2008  . EDEMA 08/25/2008  . OTHER URINARY INCONTINENCE 02/15/2008  . CHRONIC PAIN SYNDROME 12/31/2007  . BRUISE 09/14/2007  . SCOLIOSIS, LUMBAR SPINE 06/15/2007  . BACK PAIN, THORACIC REGION, RIGHT 06/11/2007  . MENISCUS TEAR 02/25/2007  . OTHER SYMPTOMS REFERABLE TO LOWER LEG JOINT 02/17/2007  . MYALGIA 02/10/2007  . CALF PAIN, RIGHT 02/10/2007  . DEPRESSION 01/01/2007  . HYPOKALEMIA, HX OF 01/01/2007  . TINEA CORPORIS 09/22/2006  . ALLERGIC DRUG REACTION, OPIOIDS 09/22/2006  . Essential hypertension 08/20/2006  . OSTEOARTHRITIS 08/20/2006    Juliann Pulse SPT 04/29/2017, 2:53 PM  [place] [address] [facility] [place], [place], [address] Phone: [phone]   Fax:  [phone]  Name: MAELY CLEMENTS MRN: [id number] Date of Birth: June 11, 1933

## 2017-05-01 ENCOUNTER — Other Ambulatory Visit: Payer: Self-pay | Admitting: Family Medicine

## 2017-05-01 DIAGNOSIS — E785 Hyperlipidemia, unspecified: Secondary | ICD-10-CM

## 2017-05-02 ENCOUNTER — Ambulatory Visit: Payer: Medicare Other | Admitting: Physical Therapy

## 2017-05-02 ENCOUNTER — Encounter: Payer: Self-pay | Admitting: Physical Therapy

## 2017-05-02 DIAGNOSIS — M546 Pain in thoracic spine: Secondary | ICD-10-CM

## 2017-05-02 DIAGNOSIS — M6281 Muscle weakness (generalized): Secondary | ICD-10-CM

## 2017-05-02 DIAGNOSIS — R262 Difficulty in walking, not elsewhere classified: Secondary | ICD-10-CM | POA: Diagnosis not present

## 2017-05-02 NOTE — Therapy (Signed)
[place] [address] [place], [place], [address] Phone: [phone]   Fax:  [phone]  Physical Therapy Treatment  Patient Details  Name: [name] MRN: [id number] Date of Birth: 05/24/1933 Referring Provider: Laurence Spates   Encounter Date: 05/02/2017  PT End of Session - 05/02/17 1059    Visit Number  7    Date for PT Re-Evaluation  06/07/17    PT Start Time  1016    PT Stop Time  1100    PT Time Calculation (min)  44 min    Activity Tolerance  Patient tolerated treatment well    Behavior During Therapy  [facility] for tasks assessed/performed       Past Medical History:  Diagnosis Date  . Allergic drug reaction    Opioids  . Back pain, thoracic   . Benign familial tremor   . Bruise   . Calf pain   . Chest pain    -- Myoview negative 2007  . Chronic pain syndrome   . Depression   . Edema   . Gallstones   . Hypertension   . Hypokalemia   . Meniscus tear   . Myalgia   . Osteoarthritis   . Other symptoms referable to lower leg joint   . Other urinary incontinence   . Scoliosis   . Skin rash   . Tinea corporis     Past Surgical History:  Procedure Laterality Date  . ABDOMINAL HYSTERECTOMY    . BREAST LUMPECTOMY    . KNEE ARTHROSCOPY     Bilateral  . LUMBAR LAMINECTOMY/DECOMPRESSION MICRODISCECTOMY  2007   L3-4,4-5, L5hemilaminectomy with leopard cage internal fiaxation L3-5  . ROTATOR CUFF REPAIR     Right    There were no vitals filed for this visit.  Subjective Assessment - 05/02/17 1021    Subjective  Pt. reports being tired and a little sore after last tx but no reports of increase in pain. Pt. reports back felt great. Pt. feels good today.    Currently in Pain?  No/denies    Pain Score  0-No pain                      [facility] Adult PT Treatment/Exercise - 05/02/17 0001      Ambulation/Gait   Gait Comments  SPC and HHA gait down hallway at start of PT session cues for  increase stride length and not shuffling feet      Lumbar Exercises: Aerobic   UBE (Upper Arm Bike)  L3x 4 mins 2FW/2BKW    Nustep  L3 x 6 min       Lumbar Exercises: Standing   Other Standing Lumbar Exercises  forward and back gait HHA x2 cues with gait to increase stride and pick up feet      Lumbar Exercises: Seated   Long Arc Quad on Chair  Both;2 sets;10 reps;Weights    LAQ on Chair Weights (lbs)  2#    Other Seated Lumbar Exercises  ball squeeze 2x10, rows 2x10 red tband, shoulder ext 2x10 yelllow tband    Other Seated Lumbar Exercises  seated march up onto 4" step 2x10 2#, ABD/ADD steps over noddle 3x5 2#               PT Short Term Goals - 04/17/17 1535      PT SHORT TERM GOAL #1   Title  independent with initial HEP  Status  Achieved        PT Long Term Goals - 04/24/17 1401      PT LONG TERM GOAL #1   Title  report minimal pain iwth ADL's    Status  Partially Met      PT LONG TERM GOAL #2   Title  increase LE strength to 4/5    Status  On-going      PT LONG TERM GOAL #3   Title  decrease TUG time to 28 seconds    Baseline  26 sec with SPC    Status  Achieved      PT LONG TERM GOAL #4   Title  get up from sitting without difficulty    Status  On-going            Plan - 05/02/17 1059    Clinical Impression Statement  Pt. tolerated tx well. Pt. did more walking today with HHA working on increasing stride and not shuffling feet. Pt. needs cues during ex to focus on correct muscle activation. Pt. did well with HHA gait in gym forward and backward but was very fatigued afterwards.    Rehab Potential  Good    PT Frequency  2x / week    PT Duration  8 weeks    PT Treatment/Interventions  ADLs/Self Care Home Management;Electrical Stimulation;Moist Heat;Balance training;Therapeutic exercise;Therapeutic activities;Functional mobility training;Patient/family education;Manual techniques    Consulted and Agree with Plan of Care  Patient        Patient will benefit from skilled therapeutic intervention in order to improve the following deficits and impairments:  Abnormal gait, Decreased range of motion, Difficulty walking, Cardiopulmonary status limiting activity, Decreased activity tolerance, Decreased balance, Decreased mobility, Decreased strength, Postural dysfunction, Impaired flexibility, Pain, Obesity, Increased muscle spasms  Visit Diagnosis: Difficulty in walking, not elsewhere classified  Acute bilateral thoracic back pain  Muscle weakness (generalized)     Problem List Patient Active Problem List   Diagnosis Date Noted  . Toe pain, right 11/17/2016  . Left shoulder pain 01/19/2016  . Hyperlipidemia 05/03/2014  . Disc disorder of lumbar region 12/30/2013  . Obesity (BMI 30-39.9) 07/01/2013  . Parkinson disease ([place]) 09/17/2012  . Biliary dyskinesia 12/10/2011  . UTI (urinary tract infection) 07/24/2011  . Lumbar back pain 06/12/2011  . Symptomatic Parkinson disease ([place]) 02/26/2011  . Depression 09/28/2010  . Pre-operative cardiovascular examination 07/23/2010  . CAROTID BRUIT, RIGHT 06/29/2009  . CHEST PAIN-UNSPECIFIED 01/13/2009  . SKIN RASH 10/11/2008  . EDEMA 08/25/2008  . OTHER URINARY INCONTINENCE 02/15/2008  . CHRONIC PAIN SYNDROME 12/31/2007  . BRUISE 09/14/2007  . SCOLIOSIS, LUMBAR SPINE 06/15/2007  . BACK PAIN, THORACIC REGION, RIGHT 06/11/2007  . MENISCUS TEAR 02/25/2007  . OTHER SYMPTOMS REFERABLE TO LOWER LEG JOINT 02/17/2007  . MYALGIA 02/10/2007  . CALF PAIN, RIGHT 02/10/2007  . DEPRESSION 01/01/2007  . HYPOKALEMIA, HX OF 01/01/2007  . TINEA CORPORIS 09/22/2006  . ALLERGIC DRUG REACTION, OPIOIDS 09/22/2006  . Essential hypertension 08/20/2006  . OSTEOARTHRITIS 08/20/2006    Juliann Pulse SPT 05/02/2017, 11:02 AM  [place] [address] [place], [place], [address] Phone: [phone]   Fax:   [phone]  Name: [name] MRN: [id number] Date of Birth: 06/17/1933

## 2017-05-05 ENCOUNTER — Ambulatory Visit: Payer: Medicare Other | Admitting: Physical Therapy

## 2017-05-05 ENCOUNTER — Encounter: Payer: Self-pay | Admitting: Physical Therapy

## 2017-05-05 DIAGNOSIS — M546 Pain in thoracic spine: Secondary | ICD-10-CM

## 2017-05-05 DIAGNOSIS — M6281 Muscle weakness (generalized): Secondary | ICD-10-CM

## 2017-05-05 DIAGNOSIS — R262 Difficulty in walking, not elsewhere classified: Secondary | ICD-10-CM

## 2017-05-05 NOTE — Therapy (Signed)
[place], [place], [address] Phone: [phone]   Fax:  [phone]  Physical Therapy Treatment  Patient Details  Name: [name] MRN: [id number] Date of Birth: [birth year] Referring Provider: Laurence Spates   Encounter Date: 05/05/2017  PT End of Session - 05/05/17 1433    Visit Number  8    Date for PT Re-Evaluation  06/07/17    PT Start Time  1351    PT Stop Time  1433    PT Time Calculation (min)  42 min    Activity Tolerance  Patient tolerated treatment well    Behavior During Therapy  [facility] for tasks assessed/performed       Past Medical History:  Diagnosis Date  . Allergic drug reaction    Opioids  . Back pain, thoracic   . Benign familial tremor   . Bruise   . Calf pain   . Chest pain    -- Myoview negative 2007  . Chronic pain syndrome   . Depression   . Edema   . Gallstones   . Hypertension   . Hypokalemia   . Meniscus tear   . Myalgia   . Osteoarthritis   . Other symptoms referable to lower leg joint   . Other urinary incontinence   . Scoliosis   . Skin rash   . Tinea corporis     Past Surgical History:  Procedure Laterality Date  . ABDOMINAL HYSTERECTOMY    . BREAST LUMPECTOMY    . KNEE ARTHROSCOPY     Bilateral  . LUMBAR LAMINECTOMY/DECOMPRESSION MICRODISCECTOMY  2007   L3-4,4-5, L5hemilaminectomy with leopard cage internal fiaxation L3-5  . ROTATOR CUFF REPAIR     Right    There were no vitals filed for this visit.  Subjective Assessment - 05/05/17 1355    Subjective  "Just tired"    Currently in Pain?  No/denies    Pain Score  0-No pain                      [facility] Adult PT Treatment/Exercise - 05/05/17 0001      Ambulation/Gait   Gait Comments  SPC and HHA gait down hallway at end  of PT session cues for increase stride length and not shuffling feet      Lumbar Exercises: Aerobic   UBE (Upper Arm Bike)  L3x 4 mins 2FW/2BKW     Nustep  L3 x 6 min       Lumbar Exercises: Standing   Other Standing Lumbar Exercises  side step, forward and back gait HHA x2      Lumbar Exercises: Seated   Long Arc Quad on Chair  Both;2 sets;10 reps;Weights    LAQ on Chair Weights (lbs)  3    Other Seated Lumbar Exercises  ball squeeze 2x10    Other Seated Lumbar Exercises  seated march up onto  3#, ABD/ADD steps over noddle 3x5 3 #               PT Short Term Goals - 04/17/17 1535      PT SHORT TERM GOAL #1   Title  independent with initial HEP    Status  Achieved        PT Long Term Goals - 04/24/17 1401      PT LONG TERM GOAL #1   Title  report minimal pain iwth ADL's    Status  Partially  Met      PT LONG TERM GOAL #2   Title  increase LE strength to 4/5    Status  On-going      PT LONG TERM GOAL #3   Title  decrease TUG time to 28 seconds    Baseline  26 sec with SPC    Status  Achieved      PT LONG TERM GOAL #4   Title  get up from sitting without difficulty    Status  On-going            Plan - 05/05/17 1433    Clinical Impression Statement  Pt enters clinic reporting increase fatigue. Does well with HHA x2 gait, cues's needed to increase step length. Functional activities fatigue pt quickly.     Rehab Potential  Good    PT Frequency  2x / week    PT Duration  8 weeks    PT Treatment/Interventions  ADLs/Self Care Home Management;Electrical Stimulation;Moist Heat;Balance training;Therapeutic exercise;Therapeutic activities;Functional mobility training;Patient/family education;Manual techniques    PT Next Visit Plan  progress strengthening and gait       Patient will benefit from skilled therapeutic intervention in order to improve the following deficits and impairments:  Abnormal gait, Decreased range of motion, Difficulty walking, Cardiopulmonary status limiting activity, Decreased activity tolerance, Decreased balance, Decreased mobility, Decreased strength, Postural dysfunction, Impaired  flexibility, Pain, Obesity, Increased muscle spasms  Visit Diagnosis: Acute bilateral thoracic back pain  Difficulty in walking, not elsewhere classified  Muscle weakness (generalized)     Problem List Patient Active Problem List   Diagnosis Date Noted  . Toe pain, right 11/17/2016  . Left shoulder pain 01/19/2016  . Hyperlipidemia 05/03/2014  . Disc disorder of lumbar region 12/30/2013  . Obesity (BMI 30-39.9) 07/01/2013  . Parkinson disease ([place]) 09/17/2012  . Biliary dyskinesia 12/10/2011  . UTI (urinary tract infection) 07/24/2011  . Lumbar back pain 06/12/2011  . Symptomatic Parkinson disease ([place]) 02/26/2011  . Depression 09/28/2010  . Pre-operative cardiovascular examination 07/23/2010  . CAROTID BRUIT, RIGHT 06/29/2009  . CHEST PAIN-UNSPECIFIED 01/13/2009  . SKIN RASH 10/11/2008  . EDEMA 08/25/2008  . OTHER URINARY INCONTINENCE 02/15/2008  . CHRONIC PAIN SYNDROME 12/31/2007  . BRUISE 09/14/2007  . SCOLIOSIS, LUMBAR SPINE 06/15/2007  . BACK PAIN, THORACIC REGION, RIGHT 06/11/2007  . MENISCUS TEAR 02/25/2007  . OTHER SYMPTOMS REFERABLE TO LOWER LEG JOINT 02/17/2007  . MYALGIA 02/10/2007  . CALF PAIN, RIGHT 02/10/2007  . DEPRESSION 01/01/2007  . HYPOKALEMIA, HX OF 01/01/2007  . TINEA CORPORIS 09/22/2006  . ALLERGIC DRUG REACTION, OPIOIDS 09/22/2006  . Essential hypertension 08/20/2006  . OSTEOARTHRITIS 08/20/2006    Scot Jun, PTA 05/05/2017, 2:37 PM  [place] [address] [place], [place], [address] Phone: [phone]   Fax:  [phone]  Name: [name] MRN: [id number] Date of Birth: Feb 14, 1934

## 2017-05-08 ENCOUNTER — Ambulatory Visit: Payer: Medicare Other | Admitting: Physical Therapy

## 2017-05-08 ENCOUNTER — Encounter: Payer: Self-pay | Admitting: Physical Therapy

## 2017-05-08 DIAGNOSIS — M6281 Muscle weakness (generalized): Secondary | ICD-10-CM

## 2017-05-08 DIAGNOSIS — R262 Difficulty in walking, not elsewhere classified: Secondary | ICD-10-CM | POA: Diagnosis not present

## 2017-05-08 DIAGNOSIS — M546 Pain in thoracic spine: Secondary | ICD-10-CM

## 2017-05-08 NOTE — Therapy (Signed)
[place], [place], [address] Phone: [phone]   Fax:  [phone]  Physical Therapy Treatment  Patient Details  Name: [name] MRN: [id number] Date of Birth: 09/13/1933 Referring Provider: Laurence Spates   Encounter Date: 05/08/2017  PT End of Session - 05/08/17 1422    Visit Number  9    Date for PT Re-Evaluation  06/07/17    PT Start Time  1351    PT Stop Time  1440    PT Time Calculation (min)  49 min    Activity Tolerance  Patient tolerated treatment well    Behavior During Therapy  [facility] for tasks assessed/performed       Past Medical History:  Diagnosis Date  . Allergic drug reaction    Opioids  . Back pain, thoracic   . Benign familial tremor   . Bruise   . Calf pain   . Chest pain    -- Myoview negative 2007  . Chronic pain syndrome   . Depression   . Edema   . Gallstones   . Hypertension   . Hypokalemia   . Meniscus tear   . Myalgia   . Osteoarthritis   . Other symptoms referable to lower leg joint   . Other urinary incontinence   . Scoliosis   . Skin rash   . Tinea corporis     Past Surgical History:  Procedure Laterality Date  . ABDOMINAL HYSTERECTOMY    . BREAST LUMPECTOMY    . KNEE ARTHROSCOPY     Bilateral  . LUMBAR LAMINECTOMY/DECOMPRESSION MICRODISCECTOMY  2007   L3-4,4-5, L5hemilaminectomy with leopard cage internal fiaxation L3-5  . ROTATOR CUFF REPAIR     Right    There were no vitals filed for this visit.  Subjective Assessment - 05/08/17 1356    Subjective  Patient reports that she is tired, "but making it"    Currently in Pain?  No/denies                      [facility] Adult PT Treatment/Exercise - 05/08/17 0001      Ambulation/Gait   Gait Comments  HHA with gentle pull, verbal cues to take bigger steps and not shuffle, to the car after treatment      Lumbar Exercises: Aerobic   UBE (Upper Arm Bike)  L4x 5 minutes    Nustep  L3 x 7 min asked her to try to maitain 45 steps /minute      Lumbar Exercises: Seated   Long Arc Quad on Chair  Both;2 sets;10 reps;Weights    LAQ on Chair Weights (lbs)  3    Other Seated Lumbar Exercises  ball squeeze 2x10, weighted ball overhead lift and trunk rotation    Other Seated Lumbar Exercises  seated marches 3#, abd/adduction over pool noodle, Bosu behind her going back and then doing partial sit ups               PT Short Term Goals - 04/17/17 1535      PT SHORT TERM GOAL #1   Title  independent with initial HEP    Status  Achieved        PT Long Term Goals - 05/08/17 1424      PT LONG TERM GOAL #1   Title  report minimal pain iwth ADL's    Status  Partially Met      PT LONG TERM  GOAL #4   Title  get up from sitting without difficulty    Status  Partially Met            Plan - 05/08/17 1423    Clinical Impression Statement  Patient is doing well with her effort, needs encouragement.  She reports that she feels stronger and is moving better.  She continues to shuffle her feet especially when fatigued, needing cues to take big steps and to not shuffle    PT Next Visit Plan  continue to work on her function, she was just so decontitioned when she started she is improving but slowly    Consulted and Agree with Plan of Care  Patient       Patient will benefit from skilled therapeutic intervention in order to improve the following deficits and impairments:  Abnormal gait, Decreased range of motion, Difficulty walking, Cardiopulmonary status limiting activity, Decreased activity tolerance, Decreased balance, Decreased mobility, Decreased strength, Postural dysfunction, Impaired flexibility, Pain, Obesity, Increased muscle spasms  Visit Diagnosis: Acute bilateral thoracic back pain  Difficulty in walking, not elsewhere classified  Muscle weakness (generalized)     Problem List Patient Active Problem List   Diagnosis Date Noted  . Toe  pain, right 11/17/2016  . Left shoulder pain 01/19/2016  . Hyperlipidemia 05/03/2014  . Disc disorder of lumbar region 12/30/2013  . Obesity (BMI 30-39.9) 07/01/2013  . Parkinson disease ([place]) 09/17/2012  . Biliary dyskinesia 12/10/2011  . UTI (urinary tract infection) 07/24/2011  . Lumbar back pain 06/12/2011  . Symptomatic Parkinson disease ([place]) 02/26/2011  . Depression 09/28/2010  . Pre-operative cardiovascular examination 07/23/2010  . CAROTID BRUIT, RIGHT 06/29/2009  . CHEST PAIN-UNSPECIFIED 01/13/2009  . SKIN RASH 10/11/2008  . EDEMA 08/25/2008  . OTHER URINARY INCONTINENCE 02/15/2008  . CHRONIC PAIN SYNDROME 12/31/2007  . BRUISE 09/14/2007  . SCOLIOSIS, LUMBAR SPINE 06/15/2007  . BACK PAIN, THORACIC REGION, RIGHT 06/11/2007  . MENISCUS TEAR 02/25/2007  . OTHER SYMPTOMS REFERABLE TO LOWER LEG JOINT 02/17/2007  . MYALGIA 02/10/2007  . CALF PAIN, RIGHT 02/10/2007  . DEPRESSION 01/01/2007  . HYPOKALEMIA, HX OF 01/01/2007  . TINEA CORPORIS 09/22/2006  . ALLERGIC DRUG REACTION, OPIOIDS 09/22/2006  . Essential hypertension 08/20/2006  . OSTEOARTHRITIS 08/20/2006    Sumner Boast., PT 05/08/2017, 2:25 PM  [place] [address] [place], [place], [address] Phone: [phone]   Fax:  [phone]  Name: [name] MRN: [id number] Date of Birth: 1933-06-20

## 2017-05-15 ENCOUNTER — Ambulatory Visit: Payer: Medicare Other | Admitting: Physical Therapy

## 2017-05-15 ENCOUNTER — Encounter: Payer: Self-pay | Admitting: Physical Therapy

## 2017-05-15 DIAGNOSIS — R262 Difficulty in walking, not elsewhere classified: Secondary | ICD-10-CM | POA: Diagnosis not present

## 2017-05-15 DIAGNOSIS — M6281 Muscle weakness (generalized): Secondary | ICD-10-CM

## 2017-05-15 DIAGNOSIS — M546 Pain in thoracic spine: Secondary | ICD-10-CM

## 2017-05-15 NOTE — Therapy (Signed)
[facility] [address] [place], [place], [address] Phone: [phone]   Fax:  [phone]  Physical Therapy Treatment  Patient Details  Name: [name] MRN: [id number] Date of Birth: 01/21/1934 Referring Provider: Naaman PlummerFred Newton   Encounter Date: 05/15/2017  PT End of Session - 05/15/17 1137    Visit Number  10    Date for PT Re-Evaluation  06/07/17    PT Start Time  1100    PT Stop Time  1145    PT Time Calculation (min)  45 min    Activity Tolerance  Patient tolerated treatment well    Behavior During Therapy  [facility] for tasks assessed/performed       Past Medical History:  Diagnosis Date  . Allergic drug reaction    Opioids  . Back pain, thoracic   . Benign familial tremor   . Bruise   . Calf pain   . Chest pain    -- Myoview negative 2007  . Chronic pain syndrome   . Depression   . Edema   . Gallstones   . Hypertension   . Hypokalemia   . Meniscus tear   . Myalgia   . Osteoarthritis   . Other symptoms referable to lower leg joint   . Other urinary incontinence   . Scoliosis   . Skin rash   . Tinea corporis     Past Surgical History:  Procedure Laterality Date  . ABDOMINAL HYSTERECTOMY    . BREAST LUMPECTOMY    . KNEE ARTHROSCOPY     Bilateral  . LUMBAR LAMINECTOMY/DECOMPRESSION MICRODISCECTOMY  2007   L3-4,4-5, L5hemilaminectomy with leopard cage internal fiaxation L3-5  . ROTATOR CUFF REPAIR     Right    There were no vitals filed for this visit.  Subjective Assessment - 05/15/17 1058    Subjective  "so far so good"    Currently in Pain?  No/denies    Pain Score  0-No pain                      [facility] Adult PT Treatment/Exercise - 05/15/17 0001      Lumbar Exercises: Aerobic   UBE (Upper Arm Bike)  L4x 5 minutes    Nustep  L3 x 5 min asked her to try to maitain 45 steps /minute      Lumbar Exercises: Standing   Row  20 reps;Theraband;Both    Theraband Level  (Row)  Level 3 (Green)    Other Standing Lumbar Exercises  OHP weight ball; HHA x2 forward, backward, and side step     Other Standing Lumbar Exercises  sit to stand elevated UBE seat x10 x5 ; standing march 3lb 2x10      Lumbar Exercises: Seated   Long Arc Quad on Chair  Both;2 sets;Weights;15 reps    LAQ on Chair Weights (lbs)  3    Other Seated Lumbar Exercises  ball squeeze 2x10    Other Seated Lumbar Exercises  HS curls green tband 2x15                PT Short Term Goals - 04/17/17 1535      PT SHORT TERM GOAL #1   Title  independent with initial HEP    Status  Achieved        PT Long Term Goals - 05/15/17 1139      PT LONG TERM GOAL #1   Title  report minimal pain iwth ADL's    Status  Achieved      PT LONG TERM GOAL #2   Title  increase LE strength to 4/5    Status  On-going            Plan - 05/15/17 1139    Clinical Impression Statement  More interventions in standing. Pt becomes very fatigue wit functional activities. Continues to need cues not to shuffle feet and take bigger steps with gait. Shuffling increase as pt fatigues.     Rehab Potential  Good    PT Frequency  2x / week    PT Duration  8 weeks    PT Treatment/Interventions  ADLs/Self Care Home Management;Electrical Stimulation;Moist Heat;Balance training;Therapeutic exercise;Therapeutic activities;Functional mobility training;Patient/family education;Manual techniques    PT Next Visit Plan  continue to work on her function, she was just so decontitioned when she started she is improving but slowly       Patient will benefit from skilled therapeutic intervention in order to improve the following deficits and impairments:  Abnormal gait, Decreased range of motion, Difficulty walking, Cardiopulmonary status limiting activity, Decreased activity tolerance, Decreased balance, Decreased mobility, Decreased strength, Postural dysfunction, Impaired flexibility, Pain, Obesity, Increased muscle  spasms  Visit Diagnosis: Difficulty in walking, not elsewhere classified  Acute bilateral thoracic back pain  Muscle weakness (generalized)     Problem List Patient Active Problem List   Diagnosis Date Noted  . Toe pain, right 11/17/2016  . Left shoulder pain 01/19/2016  . Hyperlipidemia 05/03/2014  . Disc disorder of lumbar region 12/30/2013  . Obesity (BMI 30-39.9) 07/01/2013  . Parkinson disease (HCC) 09/17/2012  . Biliary dyskinesia 12/10/2011  . UTI (urinary tract infection) 07/24/2011  . Lumbar back pain 06/12/2011  . Symptomatic Parkinson disease (HCC) 02/26/2011  . Depression 09/28/2010  . Pre-operative cardiovascular examination 07/23/2010  . CAROTID BRUIT, RIGHT 06/29/2009  . CHEST PAIN-UNSPECIFIED 01/13/2009  . SKIN RASH 10/11/2008  . EDEMA 08/25/2008  . OTHER URINARY INCONTINENCE 02/15/2008  . CHRONIC PAIN SYNDROME 12/31/2007  . BRUISE 09/14/2007  . SCOLIOSIS, LUMBAR SPINE 06/15/2007  . BACK PAIN, THORACIC REGION, RIGHT 06/11/2007  . MENISCUS TEAR Feb 27, 202008  . OTHER SYMPTOMS REFERABLE TO LOWER LEG JOINT 02/17/2007  . MYALGIA 02/10/2007  . CALF PAIN, RIGHT 02/10/2007  . DEPRESSION 01/01/2007  . HYPOKALEMIA, HX OF 01/01/2007  . TINEA CORPORIS 09/22/2006  . ALLERGIC DRUG REACTION, OPIOIDS 09/22/2006  . Essential hypertension 08/20/2006  . OSTEOARTHRITIS 08/20/2006    Grayce Sessions, PTA 05/15/2017, 11:40 AM  [facility] [place] [address] [place], [place], [address] Phone: [phone]   Fax:  [phone]  Name: [name] MRN: [id number] Date of Birth: 01-Oct-1933

## 2017-05-19 ENCOUNTER — Ambulatory Visit: Payer: Medicare Other | Attending: Specialist | Admitting: Physical Therapy

## 2017-05-19 ENCOUNTER — Encounter: Payer: Self-pay | Admitting: Physical Therapy

## 2017-05-19 DIAGNOSIS — M6281 Muscle weakness (generalized): Secondary | ICD-10-CM | POA: Diagnosis present

## 2017-05-19 DIAGNOSIS — M546 Pain in thoracic spine: Secondary | ICD-10-CM

## 2017-05-19 DIAGNOSIS — R262 Difficulty in walking, not elsewhere classified: Secondary | ICD-10-CM

## 2017-05-19 NOTE — Therapy (Signed)
[facility] [address] [place], [place], [address] Phone: [phone]   Fax:  [phone]  Physical Therapy Treatment  Patient Details  Name: [name] MRN: [id number] Date of Birth: 1933/10/27 Referring Provider: Naaman Plummer   Encounter Date: 05/19/2017  PT End of Session - 05/19/17 1513    Visit Number  11    Date for PT Re-Evaluation  06/07/17    PT Start Time  1430    PT Stop Time  1513    PT Time Calculation (min)  43 min    Activity Tolerance  Patient tolerated treatment well    Behavior During Therapy  [facility] for tasks assessed/performed       Past Medical History:  Diagnosis Date  . Allergic drug reaction    Opioids  . Back pain, thoracic   . Benign familial tremor   . Bruise   . Calf pain   . Chest pain    -- Myoview negative 2007  . Chronic pain syndrome   . Depression   . Edema   . Gallstones   . Hypertension   . Hypokalemia   . Meniscus tear   . Myalgia   . Osteoarthritis   . Other symptoms referable to lower leg joint   . Other urinary incontinence   . Scoliosis   . Skin rash   . Tinea corporis     Past Surgical History:  Procedure Laterality Date  . ABDOMINAL HYSTERECTOMY    . BREAST LUMPECTOMY    . KNEE ARTHROSCOPY     Bilateral  . LUMBAR LAMINECTOMY/DECOMPRESSION MICRODISCECTOMY  2007   L3-4,4-5, L5hemilaminectomy with leopard cage internal fiaxation L3-5  . ROTATOR CUFF REPAIR     Right    There were no vitals filed for this visit.  Subjective Assessment - 05/19/17 1439    Subjective  Pt reports that she is doing good, just out of breath    Currently in Pain?  No/denies    Pain Score  0-No pain         [facility] PT Assessment - 05/19/17 0001      Strength   Overall Strength Comments  hips and knees 4/5                  [facility] Adult PT Treatment/Exercise - 05/19/17 0001      Ambulation/Gait   Gait Comments  HHA with gentle pull, verbal cues to take  bigger steps and not shuffle, 63ft, 38ft       Lumbar Exercises: Aerobic   Nustep  L3 x 6 min asked her to try to maitain 45 steps /minute      Lumbar Exercises: Standing   Row  20 reps;Theraband;Both    Theraband Level (Row)  Level 3 (Green)    Other Standing Lumbar Exercises  OHP red ball       Lumbar Exercises: Seated   Sit to Stand  5 reps airex on mat table x3 UUE support                PT Short Term Goals - 04/17/17 1535      PT SHORT TERM GOAL #1   Title  independent with initial HEP    Status  Achieved        PT Long Term Goals - 05/19/17 1446      PT LONG TERM GOAL #2   Title  increase LE strength to 4/5  Plan - 05/19/17 1513    Clinical Impression Statement  Pt becomes very fatigue with gait and standing interventions. No reports of pain. Postural cues's needed to keep head up with standing rows and walking. Reports no functional limitation at home. Daughter voiced concern about pt leaning when walking.    Rehab Potential  Good    PT Frequency  2x / week    PT Duration  8 weeks    PT Treatment/Interventions  ADLs/Self Care Home Management;Electrical Stimulation;Moist Heat;Balance training;Therapeutic exercise;Therapeutic activities;Functional mobility training;Patient/family education;Manual techniques    PT Next Visit Plan  continue to work on her function, she was just so deconditioned when she started she is improving but slowly       Patient will benefit from skilled therapeutic intervention in order to improve the following deficits and impairments:  Abnormal gait, Decreased range of motion, Difficulty walking, Cardiopulmonary status limiting activity, Decreased activity tolerance, Decreased balance, Decreased mobility, Decreased strength, Postural dysfunction, Impaired flexibility, Pain, Obesity, Increased muscle spasms  Visit Diagnosis: Acute bilateral thoracic back pain  Difficulty in walking, not elsewhere classified  Muscle  weakness (generalized)     Problem List Patient Active Problem List   Diagnosis Date Noted  . Toe pain, right 11/17/2016  . Left shoulder pain 01/19/2016  . Hyperlipidemia 05/03/2014  . Disc disorder of lumbar region 12/30/2013  . Obesity (BMI 30-39.9) 07/01/2013  . Parkinson disease (HCC) 09/17/2012  . Biliary dyskinesia 12/10/2011  . UTI (urinary tract infection) 07/24/2011  . Lumbar back pain 06/12/2011  . Symptomatic Parkinson disease (HCC) 02/26/2011  . Depression 09/28/2010  . Pre-operative cardiovascular examination 07/23/2010  . CAROTID BRUIT, RIGHT 06/29/2009  . CHEST PAIN-UNSPECIFIED 01/13/2009  . SKIN RASH 10/11/2008  . EDEMA 08/25/2008  . OTHER URINARY INCONTINENCE 02/15/2008  . CHRONIC PAIN SYNDROME 12/31/2007  . BRUISE 09/14/2007  . SCOLIOSIS, LUMBAR SPINE 06/15/2007  . BACK PAIN, THORACIC REGION, RIGHT 06/11/2007  . MENISCUS TEAR 2020-06-2206  . OTHER SYMPTOMS REFERABLE TO LOWER LEG JOINT 02/17/2007  . MYALGIA 02/10/2007  . CALF PAIN, RIGHT 02/10/2007  . DEPRESSION 01/01/2007  . HYPOKALEMIA, HX OF 01/01/2007  . TINEA CORPORIS 09/22/2006  . ALLERGIC DRUG REACTION, OPIOIDS 09/22/2006  . Essential hypertension 08/20/2006  . OSTEOARTHRITIS 08/20/2006    Grayce Sessionsonald G Jamaris Biernat, PTA 05/19/2017, 3:16 PM  [facility] [address] [place], [place], [address] Phone: [phone]   Fax:  [phone]  Name: Sharol GivenJoan M Mudgett MRN: [id number] Date of Birth: 02/15/1934

## 2017-05-21 ENCOUNTER — Ambulatory Visit: Payer: Medicare Other | Admitting: Physical Therapy

## 2017-05-21 ENCOUNTER — Encounter: Payer: Self-pay | Admitting: Physical Therapy

## 2017-05-21 DIAGNOSIS — M546 Pain in thoracic spine: Secondary | ICD-10-CM | POA: Diagnosis not present

## 2017-05-21 DIAGNOSIS — M6281 Muscle weakness (generalized): Secondary | ICD-10-CM

## 2017-05-21 DIAGNOSIS — R262 Difficulty in walking, not elsewhere classified: Secondary | ICD-10-CM

## 2017-05-21 NOTE — Therapy (Signed)
[facility] [address] [place], [place], [address] Phone: [phone]   Fax:  [phone]  Physical Therapy Treatment  Patient Details  Name: [name] MRN: [id number] Date of Birth: 01/19/1934 Referring Provider: Naaman Plummer   Encounter Date: 05/21/2017  PT End of Session - 05/21/17 1518    Visit Number  12    Date for PT Re-Evaluation  06/07/17    PT Start Time  1430    PT Stop Time  1518    PT Time Calculation (min)  48 min    Activity Tolerance  Patient tolerated treatment well    Behavior During Therapy  [facility] for tasks assessed/performed       Past Medical History:  Diagnosis Date  . Allergic drug reaction    Opioids  . Back pain, thoracic   . Benign familial tremor   . Bruise   . Calf pain   . Chest pain    -- Myoview negative 2007  . Chronic pain syndrome   . Depression   . Edema   . Gallstones   . Hypertension   . Hypokalemia   . Meniscus tear   . Myalgia   . Osteoarthritis   . Other symptoms referable to lower leg joint   . Other urinary incontinence   . Scoliosis   . Skin rash   . Tinea corporis     Past Surgical History:  Procedure Laterality Date  . ABDOMINAL HYSTERECTOMY    . BREAST LUMPECTOMY    . KNEE ARTHROSCOPY     Bilateral  . LUMBAR LAMINECTOMY/DECOMPRESSION MICRODISCECTOMY  2007   L3-4,4-5, L5hemilaminectomy with leopard cage internal fiaxation L3-5  . ROTATOR CUFF REPAIR     Right    There were no vitals filed for this visit.  Subjective Assessment - 05/21/17 1439    Subjective  "So, so my knees and my back hurt"    Currently in Pain?  Yes    Pain Score  6     Pain Location  -- knees and back                      [facility] Adult PT Treatment/Exercise - 05/21/17 0001      Ambulation/Gait   Gait Comments  HHA with gentle pull, verbal cues to take bigger steps and not shuffle, 33ft      High Level Balance   High Level Balance Activities  Side  stepping    High Level Balance Comments  HHA x2       Lumbar Exercises: Aerobic   UBE (Upper Arm Bike)  L4x 32frd/3rev      Lumbar Exercises: Standing   Other Standing Lumbar Exercises  Standing reaches with red ball 2x10    Other Standing Lumbar Exercises  standing march 3lb 2x10 HHA x2;       Lumbar Exercises: Seated   Long Arc Quad on Chair  Both;2 sets;Weights;15 reps    LAQ on Chair Weights (lbs)  3               PT Short Term Goals - 04/17/17 1535      PT SHORT TERM GOAL #1   Title  independent with initial HEP    Status  Achieved        PT Long Term Goals - 05/19/17 1446      PT LONG TERM GOAL #2   Title  increase LE strength to 4/5  Plan - 05/21/17 1519    Clinical Impression Statement  HHAx2 for all standing activities requiring mobility. Reports that she felt like she was going to fall with standing reaches. Fatigues quick with functional activities. Ankles pronate when weight bearing. Pt reports L ankle pain with passive inversion.    Rehab Potential  Good    PT Frequency  2x / week    PT Duration  8 weeks    PT Treatment/Interventions  ADLs/Self Care Home Management;Electrical Stimulation;Moist Heat;Balance training;Therapeutic exercise;Therapeutic activities;Functional mobility training;Patient/family education;Manual techniques    PT Next Visit Plan  continue to work on her function, she was just so decontitioned when she started she is improving but slowly       Patient will benefit from skilled therapeutic intervention in order to improve the following deficits and impairments:  Abnormal gait, Decreased range of motion, Difficulty walking, Cardiopulmonary status limiting activity, Decreased activity tolerance, Decreased balance, Decreased mobility, Decreased strength, Postural dysfunction, Impaired flexibility, Pain, Obesity, Increased muscle spasms  Visit Diagnosis: Difficulty in walking, not elsewhere classified  Acute bilateral  thoracic back pain  Muscle weakness (generalized)     Problem List Patient Active Problem List   Diagnosis Date Noted  . Toe pain, right 11/17/2016  . Left shoulder pain 01/19/2016  . Hyperlipidemia 05/03/2014  . Disc disorder of lumbar region 12/30/2013  . Obesity (BMI 30-39.9) 07/01/2013  . Parkinson disease (HCC) 09/17/2012  . Biliary dyskinesia 12/10/2011  . UTI (urinary tract infection) 07/24/2011  . Lumbar back pain 06/12/2011  . Symptomatic Parkinson disease (HCC) 02/26/2011  . Depression 09/28/2010  . Pre-operative cardiovascular examination 07/23/2010  . CAROTID BRUIT, RIGHT 06/29/2009  . CHEST PAIN-UNSPECIFIED 01/13/2009  . SKIN RASH 10/11/2008  . EDEMA 08/25/2008  . OTHER URINARY INCONTINENCE 02/15/2008  . CHRONIC PAIN SYNDROME 12/31/2007  . BRUISE 09/14/2007  . SCOLIOSIS, LUMBAR SPINE 06/15/2007  . BACK PAIN, THORACIC REGION, RIGHT 06/11/2007  . MENISCUS TEAR 02/25/2007  . OTHER SYMPTOMS REFERABLE TO LOWER LEG JOINT 02/17/2007  . MYALGIA 02/10/2007  . CALF PAIN, RIGHT 02/10/2007  . DEPRESSION 01/01/2007  . HYPOKALEMIA, HX OF 01/01/2007  . TINEA CORPORIS 09/22/2006  . ALLERGIC DRUG REACTION, OPIOIDS 09/22/2006  . Essential hypertension 08/20/2006  . OSTEOARTHRITIS 08/20/2006    Grayce Sessionsonald G Jarrad Mclees, PTA 05/21/2017, 3:23 PM  [facility] [address] [place], [place], [address] Phone: [phone]   Fax:  [phone]  Name: [name] MRN: [id number] Date of Birth: 08/20/1933

## 2017-05-22 ENCOUNTER — Ambulatory Visit (INDEPENDENT_AMBULATORY_CARE_PROVIDER_SITE_OTHER): Admitting: Specialist

## 2017-05-27 ENCOUNTER — Encounter: Payer: Self-pay | Admitting: Physical Therapy

## 2017-05-27 ENCOUNTER — Ambulatory Visit: Payer: Medicare Other | Admitting: Physical Therapy

## 2017-05-27 DIAGNOSIS — M6281 Muscle weakness (generalized): Secondary | ICD-10-CM

## 2017-05-27 DIAGNOSIS — R262 Difficulty in walking, not elsewhere classified: Secondary | ICD-10-CM

## 2017-05-27 DIAGNOSIS — M546 Pain in thoracic spine: Secondary | ICD-10-CM | POA: Diagnosis not present

## 2017-05-27 NOTE — Therapy (Signed)
[facility] [address] [place], [place], [address] Phone: [phone]   Fax:  [phone]  Physical Therapy Treatment  Patient Details  Name: [name] MRN: [id number] Date of Birth: 12/27/1933 Referring Provider: Naaman PlummerFred Newton   Encounter Date: 05/27/2017  PT End of Session - 05/27/17 1435    Visit Number  13    Date for PT Re-Evaluation  06/07/17    PT Start Time  1345    PT Stop Time  1430    PT Time Calculation (min)  45 min    Activity Tolerance  Patient tolerated treatment well;Patient limited by fatigue    Behavior During Therapy  [facility] for tasks assessed/performed       Past Medical History:  Diagnosis Date  . Allergic drug reaction    Opioids  . Back pain, thoracic   . Benign familial tremor   . Bruise   . Calf pain   . Chest pain    -- Myoview negative 2007  . Chronic pain syndrome   . Depression   . Edema   . Gallstones   . Hypertension   . Hypokalemia   . Meniscus tear   . Myalgia   . Osteoarthritis   . Other symptoms referable to lower leg joint   . Other urinary incontinence   . Scoliosis   . Skin rash   . Tinea corporis     Past Surgical History:  Procedure Laterality Date  . ABDOMINAL HYSTERECTOMY    . BREAST LUMPECTOMY    . KNEE ARTHROSCOPY     Bilateral  . LUMBAR LAMINECTOMY/DECOMPRESSION MICRODISCECTOMY  2007   L3-4,4-5, L5hemilaminectomy with leopard cage internal fiaxation L3-5  . ROTATOR CUFF REPAIR     Right    There were no vitals filed for this visit.  Subjective Assessment - 05/27/17 1351    Subjective  "So, so" Pt reports that shew has been having a cough    Currently in Pain?  No/denies    Pain Score  0-No pain                      [facility] Adult PT Treatment/Exercise - 05/27/17 0001      Lumbar Exercises: Aerobic   Nustep  L2 x 8 min      Lumbar Exercises: Seated   Long Arc Quad on Chair  Both;2 sets;Weights;15 reps    LAQ on Chair  Weights (lbs)  3    Sit to Stand  5 reps    Other Seated Lumbar Exercises  seated rows green tband 2x15; ball squeezes 2x15; Seated OHP 2x10; Shoulder ext red 2x10    Other Seated Lumbar Exercises  HS curls green tband 2x15                PT Short Term Goals - 04/17/17 1535      PT SHORT TERM GOAL #1   Title  independent with initial HEP    Status  Achieved        PT Long Term Goals - 05/27/17 1436      PT LONG TERM GOAL #1   Title  report minimal pain iwth ADL's    Status  Achieved      PT LONG TERM GOAL #2   Title  increase LE strength to 4/5      PT LONG TERM GOAL #3   Title  decrease TUG time to 28 seconds  Status  Achieved            Plan - 05/27/17 1435    Clinical Impression Statement  Pt enters clinic reporting that se sis not feel well so limited interventions in standing. All interventions performs well and pt did not report any increase in pain.     Rehab Potential  Good    PT Frequency  2x / week    PT Duration  8 weeks    PT Treatment/Interventions  ADLs/Self Care Home Management;Electrical Stimulation;Moist Heat;Balance training;Therapeutic exercise;Therapeutic activities;Functional mobility training;Patient/family education;Manual techniques    PT Next Visit Plan  continue to work on her function, she was just so decontitioned when she started she is improving but slowly       Patient will benefit from skilled therapeutic intervention in order to improve the following deficits and impairments:  Abnormal gait, Decreased range of motion, Difficulty walking, Cardiopulmonary status limiting activity, Decreased activity tolerance, Decreased balance, Decreased mobility, Decreased strength, Postural dysfunction, Impaired flexibility, Pain, Obesity, Increased muscle spasms  Visit Diagnosis: Muscle weakness (generalized)  Acute bilateral thoracic back pain  Difficulty in walking, not elsewhere classified     Problem List Patient Active  Problem List   Diagnosis Date Noted  . Toe pain, right 11/17/2016  . Left shoulder pain 01/19/2016  . Hyperlipidemia 05/03/2014  . Disc disorder of lumbar region 12/30/2013  . Obesity (BMI 30-39.9) 07/01/2013  . Parkinson disease (HCC) 09/17/2012  . Biliary dyskinesia 12/10/2011  . UTI (urinary tract infection) 07/24/2011  . Lumbar back pain 06/12/2011  . Symptomatic Parkinson disease (HCC) 02/26/2011  . Depression 09/28/2010  . Pre-operative cardiovascular examination 07/23/2010  . CAROTID BRUIT, RIGHT 06/29/2009  . CHEST PAIN-UNSPECIFIED 01/13/2009  . SKIN RASH 10/11/2008  . EDEMA 08/25/2008  . OTHER URINARY INCONTINENCE 02/15/2008  . CHRONIC PAIN SYNDROME 12/31/2007  . BRUISE 09/14/2007  . SCOLIOSIS, LUMBAR SPINE 06/15/2007  . BACK PAIN, THORACIC REGION, RIGHT 06/11/2007  . MENISCUS TEAR 12-30-2006  . OTHER SYMPTOMS REFERABLE TO LOWER LEG JOINT 02/17/2007  . MYALGIA 02/10/2007  . CALF PAIN, RIGHT 02/10/2007  . DEPRESSION 01/01/2007  . HYPOKALEMIA, HX OF 01/01/2007  . TINEA CORPORIS 09/22/2006  . ALLERGIC DRUG REACTION, OPIOIDS 09/22/2006  . Essential hypertension 08/20/2006  . OSTEOARTHRITIS 08/20/2006    Grayce Sessions, PTA 05/27/2017, 2:37 PM  [facility] [place] [facility] [address] [place], [place], [address] Phone: [phone]   Fax:  [phone]  Name: [name] MRN: [id number] Date of Birth: 01-08-1934

## 2017-05-29 ENCOUNTER — Ambulatory Visit: Payer: Medicare Other | Admitting: Physical Therapy

## 2017-05-29 ENCOUNTER — Telehealth (INDEPENDENT_AMBULATORY_CARE_PROVIDER_SITE_OTHER): Payer: Self-pay | Admitting: Specialist

## 2017-05-29 ENCOUNTER — Encounter: Payer: Self-pay | Admitting: Physical Therapy

## 2017-05-29 DIAGNOSIS — M546 Pain in thoracic spine: Secondary | ICD-10-CM

## 2017-05-29 DIAGNOSIS — M6281 Muscle weakness (generalized): Secondary | ICD-10-CM

## 2017-05-29 DIAGNOSIS — R262 Difficulty in walking, not elsewhere classified: Secondary | ICD-10-CM

## 2017-05-29 NOTE — Therapy (Signed)
[facility] [place], [place], [address] Phone: [phone]   Fax:  [phone]  Physical Therapy Treatment  Patient Details  Name: [name] MRN: [id number] Date of Birth: 09/19/1933 Referring Provider: Laurence Spates   Encounter Date: 05/29/2017  PT End of Session - 05/29/17 1431    Visit Number  14    Date for PT Re-Evaluation  06/07/17    PT Start Time  0802    PT Stop Time  1431    PT Time Calculation (min)  42 min    Activity Tolerance  Patient tolerated treatment well;Patient limited by fatigue    Behavior During Therapy  [facility] for tasks assessed/performed       Past Medical History:  Diagnosis Date  . Allergic drug reaction    Opioids  . Back pain, thoracic   . Benign familial tremor   . Bruise   . Calf pain   . Chest pain    -- Myoview negative 2007  . Chronic pain syndrome   . Depression   . Edema   . Gallstones   . Hypertension   . Hypokalemia   . Meniscus tear   . Myalgia   . Osteoarthritis   . Other symptoms referable to lower leg joint   . Other urinary incontinence   . Scoliosis   . Skin rash   . Tinea corporis     Past Surgical History:  Procedure Laterality Date  . ABDOMINAL HYSTERECTOMY    . BREAST LUMPECTOMY    . KNEE ARTHROSCOPY     Bilateral  . LUMBAR LAMINECTOMY/DECOMPRESSION MICRODISCECTOMY  2007   L3-4,4-5, L5hemilaminectomy with leopard cage internal fiaxation L3-5  . ROTATOR CUFF REPAIR     Right    There were no vitals filed for this visit.  Subjective Assessment - 05/29/17 1350    Subjective  "Doing all right"    Currently in Pain?  No/denies    Pain Score  0-No pain         [facility] PT Assessment - 05/29/17 0001      Strength   Overall Strength Comments  hips and knees 4/5                  [facility] Adult PT Treatment/Exercise - 05/29/17 0001      Lumbar Exercises: Aerobic   UBE (Upper Arm Bike)  L4x 32fd/3rev    Nustep  L4 x 6  min      Lumbar Exercises: Standing   Row  Theraband;Both;15 reps x2    Theraband Level (Row)  Level 3 (Green)    Other Standing Lumbar Exercises  Standing OHP yellow ball  x10 x5; standing  reaches     Other Standing Lumbar Exercises  Alt box taps 6in SPC HHA x1 2x10       Lumbar Exercises: Seated   Other Seated Lumbar Exercises  HS curls green tband 2x15                PT Short Term Goals - 04/17/17 1535      PT SHORT TERM GOAL #1   Title  independent with initial HEP    Status  Achieved        PT Long Term Goals - 05/29/17 1434      PT LONG TERM GOAL #4   Title  get up from sitting without difficulty    Status  Partially Met  Plan - 05/29/17 1435    Clinical Impression Statement  Pt has progressed completing some of her LTG's. Pt more tolerance to standing interventions although she fatigues quick and needs a seated rest break between sets. She reports difficulty standing at times and reaching for objects.     Rehab Potential  Good    PT Frequency  2x / week    PT Duration  8 weeks    PT Treatment/Interventions  ADLs/Self Care Home Management;Electrical Stimulation;Moist Heat;Balance training;Therapeutic exercise;Therapeutic activities;Functional mobility training;Patient/family education;Manual techniques    PT Next Visit Plan  continue to work on her function, she was just so deconditioned when she started she is improving but slowly       Patient will benefit from skilled therapeutic intervention in order to improve the following deficits and impairments:  Abnormal gait, Decreased range of motion, Difficulty walking, Cardiopulmonary status limiting activity, Decreased activity tolerance, Decreased balance, Decreased mobility, Decreased strength, Postural dysfunction, Impaired flexibility, Pain, Obesity, Increased muscle spasms  Visit Diagnosis: Acute bilateral thoracic back pain  Muscle weakness (generalized)  Difficulty in walking, not  elsewhere classified     Problem List Patient Active Problem List   Diagnosis Date Noted  . Toe pain, right 11/17/2016  . Left shoulder pain 01/19/2016  . Hyperlipidemia 05/03/2014  . Disc disorder of lumbar region 12/30/2013  . Obesity (BMI 30-39.9) 07/01/2013  . Parkinson disease ([place]) 09/17/2012  . Biliary dyskinesia 12/10/2011  . UTI (urinary tract infection) 07/24/2011  . Lumbar back pain 06/12/2011  . Symptomatic Parkinson disease ([place]) 02/26/2011  . Depression 09/28/2010  . Pre-operative cardiovascular examination 07/23/2010  . CAROTID BRUIT, RIGHT 06/29/2009  . CHEST PAIN-UNSPECIFIED 01/13/2009  . SKIN RASH 10/11/2008  . EDEMA 08/25/2008  . OTHER URINARY INCONTINENCE 02/15/2008  . CHRONIC PAIN SYNDROME 12/31/2007  . BRUISE 09/14/2007  . SCOLIOSIS, LUMBAR SPINE 06/15/2007  . BACK PAIN, THORACIC REGION, RIGHT 06/11/2007  . MENISCUS TEAR 02/25/2007  . OTHER SYMPTOMS REFERABLE TO LOWER LEG JOINT 02/17/2007  . MYALGIA 02/10/2007  . CALF PAIN, RIGHT 02/10/2007  . DEPRESSION 01/01/2007  . HYPOKALEMIA, HX OF 01/01/2007  . TINEA CORPORIS 09/22/2006  . ALLERGIC DRUG REACTION, OPIOIDS 09/22/2006  . Essential hypertension 08/20/2006  . OSTEOARTHRITIS 08/20/2006    Scot Jun 05/29/2017, 2:37 PM  [place] [address] [address] [place], [place], [address] Phone: [phone]   Fax:  [phone]  Name: JAMACIA JESTER MRN: [id number] Date of Birth: [birth year]

## 2017-05-29 NOTE — Telephone Encounter (Signed)
Please advise on meds, see note

## 2017-05-29 NOTE — Telephone Encounter (Signed)
Patient's daughter Loistine Simas(Johanna) called advised the medication (Diclofenac) is no longer available per the pharmacist. Loistine SimasJohanna asked if there is another medication that Dr.Nitka can prescribe for her mother. The number to contact Loistine SimasJohanna is [phone]

## 2017-05-30 ENCOUNTER — Other Ambulatory Visit (INDEPENDENT_AMBULATORY_CARE_PROVIDER_SITE_OTHER): Payer: Self-pay | Admitting: Specialist

## 2017-05-30 MED ORDER — DICLOFENAC SODIUM 75 MG PO TBEC: 75.0000 mg | DELAYED_RELEASE_TABLET | Freq: Two times a day (BID) | ORAL | 3 refills | Status: DC

## 2017-05-30 NOTE — Telephone Encounter (Signed)
Tried to call daughter's cell no answer, I sent in a Rx for diclofenac 75 mg as the pharmacist has told the daughter that The cataflam is no longer available. Hopefully generic diclofenac in not similarly affected. jen

## 2017-06-02 ENCOUNTER — Ambulatory Visit: Payer: Medicare Other | Admitting: Physical Therapy

## 2017-06-02 ENCOUNTER — Encounter: Payer: Self-pay | Admitting: Physical Therapy

## 2017-06-02 DIAGNOSIS — M546 Pain in thoracic spine: Secondary | ICD-10-CM | POA: Diagnosis not present

## 2017-06-02 DIAGNOSIS — M6281 Muscle weakness (generalized): Secondary | ICD-10-CM

## 2017-06-02 DIAGNOSIS — R262 Difficulty in walking, not elsewhere classified: Secondary | ICD-10-CM

## 2017-06-02 NOTE — Therapy (Signed)
[place], [place], [address] Phone: [phone]   Fax:  [phone]  Physical Therapy Treatment  Patient Details  Name: [name] MRN: [id number] Date of Birth: [birth year] Referring Provider: Laurence [name]   Encounter Date: 06/02/2017  PT End of Session - 06/02/17 1443    Visit Number  15    Date for PT Re-Evaluation  06/07/17    PT Start Time  1350    PT Stop Time  1432    PT Time Calculation (min)  42 min    Activity Tolerance  Patient tolerated treatment well;Patient limited by fatigue    Behavior During Therapy  [facility] for tasks assessed/performed       Past Medical History:  Diagnosis Date  . Allergic drug reaction    Opioids  . Back pain, thoracic   . Benign familial tremor   . Bruise   . Calf pain   . Chest pain    -- Myoview negative 2007  . Chronic pain syndrome   . Depression   . Edema   . Gallstones   . Hypertension   . Hypokalemia   . Meniscus tear   . Myalgia   . Osteoarthritis   . Other symptoms referable to lower leg joint   . Other urinary incontinence   . Scoliosis   . Skin rash   . Tinea corporis     Past Surgical History:  Procedure Laterality Date  . ABDOMINAL HYSTERECTOMY    . BREAST LUMPECTOMY    . KNEE ARTHROSCOPY     Bilateral  . LUMBAR LAMINECTOMY/DECOMPRESSION MICRODISCECTOMY  2007   L3-4,4-5, L5hemilaminectomy with leopard cage internal fiaxation L3-5  . ROTATOR CUFF REPAIR     Right    There were no vitals filed for this visit.  Subjective Assessment - 06/02/17 1354    Subjective  Pt reports that things are fine    Currently in Pain?  Yes    Pain Score  4     Pain Location  Knee    Pain Orientation  Right;Left                      [facility] Adult PT Treatment/Exercise - 06/02/17 0001      Ambulation/Gait   Gait Comments  HHA with gentle pull, verbal cues to take bigger steps and not shuffle, 39f      Lumbar  Exercises: Aerobic   UBE (Upper Arm Bike)  L4x 372f/3rev    Nustep  L4 x 4 min      Lumbar Exercises: Standing   Row  Theraband;Both;15 reps x2    Theraband Level (Row)  Level 3 (Green)    Other Standing Lumbar Exercises  Standing OHP yellow ball  x10 x5; standing  reaches     Other Standing Lumbar Exercises  Alt box taps 6in HHA x2 2x10       Lumbar Exercises: Seated   Long Arc Quad on Chair  Both;2 sets;Weights;15 reps    LAQ on Chair Weights (lbs)  3    Sit to Stand  5 reps    Other Seated Lumbar Exercises  HS curls blue tband 2x15                PT Short Term Goals - 04/17/17 1535      PT SHORT TERM GOAL #1   Title  independent with initial HEP    Status  Achieved  PT Long Term Goals - 05/29/17 1434      PT LONG TERM GOAL #4   Title  get up from sitting without difficulty    Status  Partially Met            Plan - 06/02/17 1505    Clinical Impression Statement  Pt with a little more fatigue due to the increase standing interventions. She reports increase I knee pain from been up on them more. Difficulty with standing OHP. Cues to take bigger steps with gait     PT Frequency  2x / week    PT Treatment/Interventions  ADLs/Self Care Home Management;Electrical Stimulation;Moist Heat;Balance training;Therapeutic exercise;Therapeutic activities;Functional mobility training;Patient/family education;Manual techniques    PT Next Visit Plan  continue to work on her function, she was just so deconditioned when she started she is improving but slowly       Patient will benefit from skilled therapeutic intervention in order to improve the following deficits and impairments:  Abnormal gait, Decreased range of motion, Difficulty walking, Cardiopulmonary status limiting activity, Decreased activity tolerance, Decreased balance, Decreased mobility, Decreased strength, Postural dysfunction, Impaired flexibility, Pain, Obesity, Increased muscle spasms  Visit  Diagnosis: Muscle weakness (generalized)  Acute bilateral thoracic back pain  Difficulty in walking, not elsewhere classified     Problem List Patient Active Problem List   Diagnosis Date Noted  . Toe pain, right 11/17/2016  . Left shoulder pain 01/19/2016  . Hyperlipidemia 05/03/2014  . Disc disorder of lumbar region 12/30/2013  . Obesity (BMI 30-39.9) 07/01/2013  . Parkinson disease ([place]) 09/17/2012  . Biliary dyskinesia 12/10/2011  . UTI (urinary tract infection) 07/24/2011  . Lumbar back pain 06/12/2011  . Symptomatic Parkinson disease ([place]) 02/26/2011  . Depression 09/28/2010  . Pre-operative cardiovascular examination 07/23/2010  . CAROTID BRUIT, RIGHT 06/29/2009  . CHEST PAIN-UNSPECIFIED 01/13/2009  . SKIN RASH 10/11/2008  . EDEMA 08/25/2008  . OTHER URINARY INCONTINENCE 02/15/2008  . CHRONIC PAIN SYNDROME 12/31/2007  . BRUISE 09/14/2007  . SCOLIOSIS, LUMBAR SPINE 06/15/2007  . BACK PAIN, THORACIC REGION, RIGHT 06/11/2007  . MENISCUS TEAR 02/25/2007  . OTHER SYMPTOMS REFERABLE TO LOWER LEG JOINT 02/17/2007  . MYALGIA 02/10/2007  . CALF PAIN, RIGHT 02/10/2007  . DEPRESSION 01/01/2007  . HYPOKALEMIA, HX OF 01/01/2007  . TINEA CORPORIS 09/22/2006  . ALLERGIC DRUG REACTION, OPIOIDS 09/22/2006  . Essential hypertension 08/20/2006  . OSTEOARTHRITIS 08/20/2006    Scot Jun, PTA 06/02/2017, 3:10 PM  [place] [address] [place], [place], [address] Phone: [phone]   Fax:  [phone]  Name: [name] MRN: [id number] Date of Birth: [birth year]

## 2017-06-04 ENCOUNTER — Encounter: Payer: Self-pay | Admitting: Physical Therapy

## 2017-06-04 ENCOUNTER — Ambulatory Visit: Payer: Medicare Other | Admitting: Physical Therapy

## 2017-06-04 DIAGNOSIS — M6281 Muscle weakness (generalized): Secondary | ICD-10-CM

## 2017-06-04 DIAGNOSIS — M546 Pain in thoracic spine: Secondary | ICD-10-CM

## 2017-06-04 DIAGNOSIS — R262 Difficulty in walking, not elsewhere classified: Secondary | ICD-10-CM

## 2017-06-04 NOTE — Therapy (Signed)
[place], [place], [address] Phone: [phone]   Fax:  [phone]  Physical Therapy Treatment  Patient Details  Name: [name] MRN: [id number] Date of Birth: 1933/10/28 Referring Provider: Laurence Spates   Encounter Date: 06/04/2017  PT End of Session - 06/04/17 1425    Visit Number  16    Date for PT Re-Evaluation  06/07/17    PT Start Time  1345    PT Stop Time  1426    PT Time Calculation (min)  41 min    Activity Tolerance  Patient tolerated treatment well;Patient limited by fatigue    Behavior During Therapy  [facility] for tasks assessed/performed       Past Medical History:  Diagnosis Date  . Allergic drug reaction    Opioids  . Back pain, thoracic   . Benign familial tremor   . Bruise   . Calf pain   . Chest pain    -- Myoview negative 2007  . Chronic pain syndrome   . Depression   . Edema   . Gallstones   . Hypertension   . Hypokalemia   . Meniscus tear   . Myalgia   . Osteoarthritis   . Other symptoms referable to lower leg joint   . Other urinary incontinence   . Scoliosis   . Skin rash   . Tinea corporis     Past Surgical History:  Procedure Laterality Date  . ABDOMINAL HYSTERECTOMY    . BREAST LUMPECTOMY    . KNEE ARTHROSCOPY     Bilateral  . LUMBAR LAMINECTOMY/DECOMPRESSION MICRODISCECTOMY  2007   L3-4,4-5, L5hemilaminectomy with leopard cage internal fiaxation L3-5  . ROTATOR CUFF REPAIR     Right    There were no vitals filed for this visit.  Subjective Assessment - 06/04/17 1352    Subjective  "All right, little pain in my knee"    Currently in Pain?  Yes    Pain Score  5     Pain Location  Knee    Pain Orientation  Right                      [facility] Adult PT Treatment/Exercise - 06/04/17 0001      Lumbar Exercises: Aerobic   UBE (Upper Arm Bike)  L4x 22fd/2rev    Nustep  L4 x 6 min      Lumbar Exercises: Standing   Row   Theraband;Both;15 reps x2    Theraband Level (Row)  Level 4 (Blue)    Other Standing Lumbar Exercises  standing march 2x10      Lumbar Exercises: Seated   Long Arc Quad on Chair  Both;2 sets;Weights;15 reps    LAQ on Chair Weights (lbs)  3    Sit to Stand  5 reps x2 from elevated ube    Other Seated Lumbar Exercises  HS curls blue tband 2x15                PT Short Term Goals - 04/17/17 1535      PT SHORT TERM GOAL #1   Title  independent with initial HEP    Status  Achieved        PT Long Term Goals - 06/04/17 1426      PT LONG TERM GOAL #1   Title  report minimal pain iwth ADL's    Status  Achieved  PT LONG TERM GOAL #2   Title  increase LE strength to 4/5    Status  Achieved      PT LONG TERM GOAL #3   Title  decrease TUG time to 28 seconds    Status  Achieved      PT LONG TERM GOAL #4   Title  get up from sitting without difficulty    Status  Achieved            Plan - 06/04/17 1426    Clinical Impression Statement  Pt has progressed, all goals met. She does reports some knee pain.    Rehab Potential  Good    PT Frequency  2x / week    PT Duration  8 weeks    PT Treatment/Interventions  ADLs/Self Care Home Management;Electrical Stimulation;Moist Heat;Balance training;Therapeutic exercise;Therapeutic activities;Functional mobility training;Patient/family education;Manual techniques    PT Next Visit Plan  D/C PT       Patient will benefit from skilled therapeutic intervention in order to improve the following deficits and impairments:  Abnormal gait, Decreased range of motion, Difficulty walking, Cardiopulmonary status limiting activity, Decreased activity tolerance, Decreased balance, Decreased mobility, Decreased strength, Postural dysfunction, Impaired flexibility, Pain, Obesity, Increased muscle spasms  Visit Diagnosis: Muscle weakness (generalized)  Difficulty in walking, not elsewhere classified  Acute bilateral thoracic back  pain     Problem List Patient Active Problem List   Diagnosis Date Noted  . Toe pain, right 11/17/2016  . Left shoulder pain 01/19/2016  . Hyperlipidemia 05/03/2014  . Disc disorder of lumbar region 12/30/2013  . Obesity (BMI 30-39.9) 07/01/2013  . Parkinson disease ([place]) 09/17/2012  . Biliary dyskinesia 12/10/2011  . UTI (urinary tract infection) 07/24/2011  . Lumbar back pain 06/12/2011  . Symptomatic Parkinson disease ([place]) 02/26/2011  . Depression 09/28/2010  . Pre-operative cardiovascular examination 07/23/2010  . CAROTID BRUIT, RIGHT 06/29/2009  . CHEST PAIN-UNSPECIFIED 01/13/2009  . SKIN RASH 10/11/2008  . EDEMA 08/25/2008  . OTHER URINARY INCONTINENCE 02/15/2008  . CHRONIC PAIN SYNDROME 12/31/2007  . BRUISE 09/14/2007  . SCOLIOSIS, LUMBAR SPINE 06/15/2007  . BACK PAIN, THORACIC REGION, RIGHT 06/11/2007  . MENISCUS TEAR 02/25/2007  . OTHER SYMPTOMS REFERABLE TO LOWER LEG JOINT 02/17/2007  . MYALGIA 02/10/2007  . CALF PAIN, RIGHT 02/10/2007  . DEPRESSION 01/01/2007  . HYPOKALEMIA, HX OF 01/01/2007  . TINEA CORPORIS 09/22/2006  . ALLERGIC DRUG REACTION, OPIOIDS 09/22/2006  . Essential hypertension 08/20/2006  . OSTEOARTHRITIS 08/20/2006   PHYSICAL THERAPY DISCHARGE SUMMARY  Visits from Start of Care: 16 Plan: Patient agrees to discharge.  Patient goals were met. Patient is being discharged due to meeting the stated rehab goals.  ?????     Scot Jun, PTA 06/04/2017, 2:27 PM  [place], [place], [address] Phone: [phone]   Fax:  [phone]  Name: [name] MRN: [id number] Date of Birth: 1933-04-10

## 2017-06-11 ENCOUNTER — Encounter: Payer: Medicare Other | Admitting: Physical Therapy

## 2017-06-18 ENCOUNTER — Encounter: Payer: Self-pay | Admitting: Podiatry

## 2017-06-18 ENCOUNTER — Other Ambulatory Visit: Payer: Self-pay | Admitting: Podiatry

## 2017-06-18 ENCOUNTER — Ambulatory Visit (INDEPENDENT_AMBULATORY_CARE_PROVIDER_SITE_OTHER): Payer: Medicare Other

## 2017-06-18 ENCOUNTER — Ambulatory Visit (INDEPENDENT_AMBULATORY_CARE_PROVIDER_SITE_OTHER): Payer: Medicare Other | Admitting: Podiatry

## 2017-06-18 DIAGNOSIS — M7752 Other enthesopathy of left foot: Secondary | ICD-10-CM | POA: Diagnosis not present

## 2017-06-18 DIAGNOSIS — M778 Other enthesopathies, not elsewhere classified: Secondary | ICD-10-CM

## 2017-06-18 DIAGNOSIS — M779 Enthesopathy, unspecified: Secondary | ICD-10-CM

## 2017-06-18 DIAGNOSIS — R52 Pain, unspecified: Secondary | ICD-10-CM

## 2017-06-20 NOTE — Progress Notes (Signed)
   HPI: 84 year old female presenting today as a new patient with a chief complaint of intermittent cramping pain to the lateral aspect of the left foot that began a few months ago. Walking increases the pain. She has not done anything for treatment. Patient is here for further evaluation and treatment.    Past Medical History:  Diagnosis Date  . Allergic drug reaction    Opioids  . Back pain, thoracic   . Benign familial tremor   . Bruise   . Calf pain   . Chest pain    -- Myoview negative 2007  . Chronic pain syndrome   . Depression   . Edema   . Gallstones   . Hypertension   . Hypokalemia   . Meniscus tear   . Myalgia   . Osteoarthritis   . Other symptoms referable to lower leg joint   . Other urinary incontinence   . Scoliosis   . Skin rash   . Tinea corporis      Physical Exam: General: The patient is alert and oriented x3 in no acute distress.  Dermatology: Skin is warm, dry and supple bilateral lower extremities. Negative for open lesions or macerations.  Vascular: Palpable pedal pulses bilaterally. No edema or erythema noted. Capillary refill within normal limits.  Neurological: Epicritic and protective threshold grossly intact bilaterally.   Musculoskeletal Exam: Pain on palpation to the left lateral foot. Range of motion within normal limits to all pedal and ankle joints bilateral. Muscle strength 5/5 in all groups bilateral.   Radiographic Exam:  Normal osseous mineralization. Joint spaces preserved. No fracture/dislocation/boney destruction.    Assessment: 1. Capsulitis left lateral foot    Plan of Care:  1. Patient evaluated. X-Rays reviewed.  2. Recommended New Balance walking shoes, not flat shoes. 3. Injection of 0.5 mLs Celestone Soluspan injected into the left lateral foot.  4. Return to clinic as needed.       Felecia ShellingBrent M. Violanda Bobeck, DPM [facility]  Dr. Felecia ShellingBrent M. Haydyn Liddell, DPM    [address]                                         [place], [place] [address]                Office [phone]  Fax [phone]

## 2017-07-31 ENCOUNTER — Other Ambulatory Visit: Payer: Self-pay | Admitting: Family Medicine

## 2017-08-05 ENCOUNTER — Other Ambulatory Visit: Payer: Self-pay | Admitting: Family Medicine

## 2017-08-21 ENCOUNTER — Other Ambulatory Visit: Payer: Self-pay | Admitting: Family Medicine

## 2017-08-21 DIAGNOSIS — I10 Essential (primary) hypertension: Secondary | ICD-10-CM

## 2017-08-26 ENCOUNTER — Other Ambulatory Visit: Payer: Self-pay | Admitting: Family Medicine

## 2017-08-26 DIAGNOSIS — R110 Nausea: Secondary | ICD-10-CM

## 2017-09-09 ENCOUNTER — Ambulatory Visit: Payer: Self-pay

## 2017-09-09 NOTE — Telephone Encounter (Signed)
Pt.'s daughter reports pt. Has been vomiting x 2 weeks. Mainly "Phlegm" per daughter. Is keeping medications and fluids down. Has not had much of an appetite. No one in the family has been sick. No diarrhea. Daughter is concerned because of pt.'s Parkinson's. Appointment made for tomorrow. Instructed if symptoms worsen to go to ED. Verbalizes understanding.  Reason for Disposition . [1] MILD or MODERATE vomiting AND [2] present > 48 hours (2 days) (Exception: mild vomiting with associated diarrhea)  Answer Assessment - Initial Assessment Questions 1. VOMITING SEVERITY: "How many times have you vomited in the past 24 hours?"     - MILD:  1 - 2 times/day    - MODERATE: 3 - 5 times/day, decreased oral intake without significant weight loss or symptoms of dehydration    - SEVERE: 6 or more times/day, vomits everything or nearly everything, with significant weight loss, symptoms of dehydration      2 2. ONSET: "When did the vomiting begin?"      2 weeks ago 3. FLUIDS: "What fluids or food have you vomited up today?" "Have you been able to keep any fluids down?"     Yes - keeping fluids down 4. ABDOMINAL PAIN: "Are your having any abdominal pain?" If yes : "How bad is it and what does it feel like?" (e.g., crampy, dull, intermittent, constant)      Yes - queazy 5. DIARRHEA: "Is there any diarrhea?" If so, ask: "How many times today?"      No 6. CONTACTS: "Is there anyone else in the family with the same symptoms?"      No 7. CAUSE: "What do you think is causing your vomiting?"     Unsure 8. HYDRATION STATUS: "Any signs of dehydration?" (e.g., dry mouth [not only dry lips], too weak to stand) "When did you last urinate?"     No 9. OTHER SYMPTOMS: "Do you have any other symptoms?" (e.g., fever, headache, vertigo, vomiting blood or coffee grounds, recent head injury)     Weakness, lightheaded 10. PREGNANCY: "Is there any chance you are pregnant?" "When was your last menstrual period?"        No  Protocols used: [facility] Inc[facility]OM[facility]ING-[facility]-AH

## 2017-09-10 ENCOUNTER — Ambulatory Visit (INDEPENDENT_AMBULATORY_CARE_PROVIDER_SITE_OTHER): Payer: Medicare Other | Admitting: Family

## 2017-09-10 ENCOUNTER — Encounter: Payer: Self-pay | Admitting: Family

## 2017-09-10 VITALS — BP 126/82 | HR 72 | Temp 98.7°F | Resp 18 | Wt 274.0 lb

## 2017-09-10 DIAGNOSIS — R110 Nausea: Secondary | ICD-10-CM

## 2017-09-10 DIAGNOSIS — R1013 Epigastric pain: Secondary | ICD-10-CM

## 2017-09-10 LAB — COMPREHENSIVE METABOLIC PANEL
ALT: 4 U/L (ref 0–35)
AST: 6 U/L (ref 0–37)
Albumin: 4.3 g/dL (ref 3.5–5.2)
Alkaline Phosphatase: 88 U/L (ref 39–117)
BILIRUBIN TOTAL: 0.5 mg/dL (ref 0.2–1.2)
BUN: 21 mg/dL (ref 6–23)
CHLORIDE: 105 meq/L (ref 96–112)
CO2: 29 meq/L (ref 19–32)
Calcium: 9.2 mg/dL (ref 8.4–10.5)
Creatinine, Ser: 0.93 mg/dL (ref 0.40–1.20)
GFR: 74.31 mL/min (ref >60.00)
GLUCOSE: 105 mg/dL — AB (ref 70–99)
POTASSIUM: 4.7 meq/L (ref 3.5–5.1)
Sodium: 141 mEq/L (ref 135–145)
Total Protein: 6.8 g/dL (ref 6.0–8.3)

## 2017-09-10 LAB — CBC WITH DIFFERENTIAL/PLATELET
BASOS PCT: 0.5 % (ref 0.0–3.0)
Basophils Absolute: 0 10*3/uL (ref 0.0–0.1)
EOS PCT: 8.2 % — AB (ref 0.0–5.0)
Eosinophils Absolute: 0.4 10*3/uL (ref 0.0–0.7)
HCT: 36.7 % (ref 36.0–46.0)
Hemoglobin: 11.9 g/dL — ABNORMAL LOW (ref 12.0–15.0)
LYMPHS ABS: 1.1 10*3/uL (ref 0.7–4.0)
Lymphocytes Relative: 24.5 % (ref 12.0–46.0)
MCHC: 32.5 g/dL (ref 30.0–36.0)
MCV: 88.7 fl (ref 78.0–100.0)
MONOS PCT: 12.2 % — AB (ref 3.0–12.0)
Monocytes Absolute: 0.6 10*3/uL (ref 0.1–1.0)
NEUTROS ABS: 2.6 10*3/uL (ref 1.4–7.7)
NEUTROS PCT: 54.6 % (ref 43.0–77.0)
PLATELETS: 303 10*3/uL (ref 150.0–400.0)
RBC: 4.14 Mil/uL (ref 3.87–5.11)
RDW: 16.1 % — ABNORMAL HIGH (ref 11.5–15.5)
WBC: 4.7 10*3/uL (ref 4.0–10.5)

## 2017-09-10 LAB — LIPASE: Lipase: 8 U/L — ABNORMAL LOW (ref 11.0–59.0)

## 2017-09-10 MED ORDER — PANTOPRAZOLE SODIUM 40 MG PO TBEC: 40.0000 mg | DELAYED_RELEASE_TABLET | Freq: Every day | ORAL | 3 refills | Status: DC

## 2017-09-10 NOTE — Progress Notes (Signed)
Subjective:    Patient ID: [name], female    DOB: September 06, 1933, 84 y.o.   MRN: [id number]  HPI   [name] is an 84 yr old female with chief complaint of intermittent vomitting. Reports that it has been going on for "a couple of months." Does not vomit every day but has + nausea every day.  She reports some epigastric "funny queasy feeling." Reports that her BMs are normal. Denies black/bloody stools. Denies coffee ground emesis or hematemesis.  Most of the time her vomiting is after meals.  She reports that she does have gerd symptoms which she has "just about every day."  She takes zantac BID. She does take diclofenac bid for chronic back pain.   Review of Systems    see HPI  Past Medical History:  Diagnosis Date  . Allergic drug reaction    Opioids  . Back pain, thoracic   . Benign familial tremor   . Bruise   . Calf pain   . Chest pain    -- Myoview negative 2007  . Chronic pain syndrome   . Depression   . Edema   . Gallstones   . Hypertension   . Hypokalemia   . Meniscus tear   . Myalgia   . Osteoarthritis   . Other symptoms referable to lower leg joint   . Other urinary incontinence   . Scoliosis   . Skin rash   . Tinea corporis      Social History   Socioeconomic History  . Marital status: Widowed    Spouse name: Not on file  . Number of children: 3  . Years of education: 41  . Highest education level: Not on file  Occupational History  . Occupation: Midwife: [employer]  Social Needs  . Financial resource strain: Not on file  . Food insecurity:    Worry: Not on file    Inability: Not on file  . Transportation needs:    Medical: Not on file    Non-medical: Not on file  Tobacco Use  . Smoking status: Never Smoker  . Smokeless tobacco: Never Used  Substance and Sexual Activity  . Alcohol use: No  . Drug use: No  . Sexual activity: Never    Partners: Male  Lifestyle  . Physical activity:    Days  per week: Not on file    Minutes per session: Not on file  . Stress: Not on file  Relationships  . Social connections:    Talks on phone: Not on file    Gets together: Not on file    Attends religious service: Not on file    Active member of club or organization: Not on file    Attends meetings of clubs or organizations: Not on file    Relationship status: Not on file  . Intimate partner violence:    Fear of current or ex partner: Not on file    Emotionally abused: Not on file    Physically abused: Not on file    Forced sexual activity: Not on file  Other Topics Concern  . Not on file  Social History Narrative  . Not on file    Past Surgical History:  Procedure Laterality Date  . ABDOMINAL HYSTERECTOMY    . BREAST LUMPECTOMY    . KNEE ARTHROSCOPY     Bilateral  . LUMBAR LAMINECTOMY/DECOMPRESSION MICRODISCECTOMY  2007   L3-4,4-5, L5hemilaminectomy with leopard cage internal fiaxation L3-5  .  ROTATOR CUFF REPAIR     Right    Family History  Problem Relation Age of Onset  . Stroke Mother   . Parkinsonism Mother     Allergies  Allergen Reactions  . Codeine Nausea And Vomiting  . Gabapentin Swelling    Caused Bilateral feet swelling  . Latex Other (See Comments)    Burns the skin  . Morphine Sulfate Nausea And Vomiting  . Sertraline Hcl     Unknown reaction  . Penicillins Rash    Current Outpatient Medications on File Prior to Visit  Medication Sig Dispense Refill  . amantadine (SYMMETREL) 100 MG capsule Take 1 capsule (100 mg total) by mouth 2 (two) times daily. (Patient taking differently: Take 100 mg by mouth daily. ) 60 capsule 5  . amLODipine-valsartan (EXFORGE) 10-320 MG tablet TAKE 1 TABLET BY MOUTH ONCE DAILY 90 tablet 0  . carbidopa-levodopa (SINEMET) 25-100 MG per tablet Take 5 tablets by mouth 4 (four) times daily.     Marland Kitchen. desoximetasone (TOPICORT) 0.05 % cream APPLY TO AFFECTED AREA TWICE A DAY 60 g 1  . FLUoxetine (PROZAC) 40 MG capsule TAKE 1 CAPSULE  BY MOUTH EVERY DAY 30 capsule 0  . OLANZapine (ZYPREXA) 10 MG tablet TAKE 1 TABLET BY MOUTH EVERY DAY 30 tablet 0  . pravastatin (PRAVACHOL) 20 MG tablet take 1 tablet by mouth once daily 90 tablet 1  . primidone (MYSOLINE) 50 MG tablet Take 100 mg by mouth at bedtime.   0  . ranitidine (ZANTAC) 150 MG tablet TAKE 1 TABLET BY MOUTH TWICE DAILY 60 tablet 0   No current facility-administered medications on file prior to visit.     BP 126/82 (BP Location: Left Arm) Comment (Cuff Size): thigh  Pulse 72   Temp 98.7 F (37.1 C) (Oral)   Resp 18   Wt 274 lb (124.3 kg)   SpO2 97%   BMI 45.60 kg/m    Objective:   Physical Exam  Constitutional: She appears well-developed and well-nourished.  Cardiovascular: Normal rate, regular rhythm and normal heart sounds.  No murmur heard. Pulmonary/Chest: Effort normal and breath sounds normal. No respiratory distress. She has no wheezes.  Abdominal: Soft. Bowel sounds are normal. She exhibits no distension. There is no tenderness.  Skin: Skin is warm and dry.  Psychiatric: She has a normal mood and affect. Her behavior is normal. Judgment and thought content normal.          Assessment & Plan:  Nausea/epigastric pain- I am concerned that she may have an ulcer/gastritis from her NSAID use. Advised her to d/c diclofenac, and instead switch to tylenol.  Also, will d/c zantac and start protonix.  Obtain CMET, lipase, CBC and H pylori breath test. If symptoms do not improve with the above recommendations and if work up negative, would plan referral to GI.

## 2017-09-10 NOTE — Patient Instructions (Addendum)
Stop diclofenac and ranitidine. Start protonix once daily for acid.  For arthritis pain you may add tylenol 1000mg  by mouth 3 times daily- do not exceed this amount. Call if new/worsening symptoms. Go to the ER if you develop severe abdominal pain.

## 2017-09-11 ENCOUNTER — Ambulatory Visit (HOSPITAL_BASED_OUTPATIENT_CLINIC_OR_DEPARTMENT_OTHER)
Admission: RE | Admit: 2017-09-11 | Discharge: 2017-09-11 | Disposition: A | Discharge status: Home / Self Care | Payer: Medicare Other | Source: Ambulatory Visit | Attending: Family | Admitting: Family

## 2017-09-11 DIAGNOSIS — I77811 Abdominal aortic ectasia: Secondary | ICD-10-CM | POA: Insufficient documentation

## 2017-09-11 DIAGNOSIS — N281 Cyst of kidney, acquired: Secondary | ICD-10-CM | POA: Insufficient documentation

## 2017-09-11 DIAGNOSIS — R1013 Epigastric pain: Secondary | ICD-10-CM | POA: Insufficient documentation

## 2017-09-11 DIAGNOSIS — K828 Other specified diseases of gallbladder: Secondary | ICD-10-CM | POA: Diagnosis not present

## 2017-09-11 LAB — H. PYLORI BREATH TEST: H. pylori Breath Test: NOT DETECTED

## 2017-09-13 ENCOUNTER — Encounter: Payer: Self-pay | Admitting: Family

## 2017-09-13 DIAGNOSIS — I77819 Aortic ectasia, unspecified site: Secondary | ICD-10-CM

## 2017-09-13 HISTORY — DX: Aortic ectasia, unspecified site: I77.819

## 2017-09-25 ENCOUNTER — Other Ambulatory Visit (INDEPENDENT_AMBULATORY_CARE_PROVIDER_SITE_OTHER): Payer: Self-pay | Admitting: Specialist

## 2017-09-25 ENCOUNTER — Other Ambulatory Visit: Payer: Self-pay | Admitting: Family Medicine

## 2017-09-25 ENCOUNTER — Encounter: Payer: Self-pay | Admitting: Family Medicine

## 2017-09-25 ENCOUNTER — Ambulatory Visit (INDEPENDENT_AMBULATORY_CARE_PROVIDER_SITE_OTHER): Payer: Medicare Other | Admitting: Family Medicine

## 2017-09-25 VITALS — BP 144/71 | HR 67 | Temp 98.6°F | Resp 16 | Ht 65.0 in | Wt 270.6 lb

## 2017-09-25 DIAGNOSIS — R1013 Epigastric pain: Secondary | ICD-10-CM

## 2017-09-25 DIAGNOSIS — M170 Bilateral primary osteoarthritis of knee: Secondary | ICD-10-CM | POA: Diagnosis not present

## 2017-09-25 DIAGNOSIS — R110 Nausea: Secondary | ICD-10-CM

## 2017-09-25 DIAGNOSIS — I77819 Aortic ectasia, unspecified site: Secondary | ICD-10-CM | POA: Diagnosis not present

## 2017-09-25 MED ORDER — PANTOPRAZOLE SODIUM 40 MG PO TBEC: 40.0000 mg | DELAYED_RELEASE_TABLET | Freq: Two times a day (BID) | ORAL | 3 refills | Status: DC

## 2017-09-25 MED ORDER — DICLOFENAC SODIUM 1 % TD GEL: 4.0000 g | Freq: Four times a day (QID) | TRANSDERMAL | 2 refills | Status: DC

## 2017-09-25 NOTE — Assessment & Plan Note (Signed)
D/w pt  Will repeat in 1 year and pt and daughter request

## 2017-09-25 NOTE — Patient Instructions (Signed)
Food Choices for Gastroesophageal Reflux Disease, Adult When you have gastroesophageal reflux disease (GERD), the foods you eat and your eating habits are very important. Choosing the right foods can help ease your discomfort. What guidelines do I need to follow?  Choose fruits, vegetables, whole grains, and low-fat dairy products.  Choose low-fat meat, fish, and poultry.  Limit fats such as oils, salad dressings, butter, nuts, and avocado.  Keep a food diary. This helps you identify foods that cause symptoms.  Avoid foods that cause symptoms. These may be different for everyone.  Eat small meals often instead of 3 large meals a day.  Eat your meals slowly, in a place where you are relaxed.  Limit fried foods.  Cook foods using methods other than frying.  Avoid drinking alcohol.  Avoid drinking large amounts of liquids with your meals.  Avoid bending over or lying down until 2-3 hours after eating. What foods are not recommended? These are some foods and drinks that may make your symptoms worse: Vegetables  Tomatoes. Tomato juice. Tomato and spaghetti sauce. Chili peppers. Onion and garlic. Horseradish. Fruits  Oranges, grapefruit, and lemon (fruit and juice). Meats  High-fat meats, fish, and poultry. This includes hot dogs, ribs, ham, sausage, salami, and bacon. Dairy  Whole milk and chocolate milk. Sour cream. Cream. Butter. Ice cream. Cream cheese. Drinks  Coffee and tea. Bubbly (carbonated) drinks or energy drinks. Condiments  Hot sauce. Barbecue sauce. Sweets/Desserts  Chocolate and cocoa. Donuts. Peppermint and spearmint. Fats and Oils  High-fat foods. This includes French fries and potato chips. Other  Vinegar. Strong spices. This includes black pepper, white pepper, red pepper, cayenne, curry powder, cloves, ginger, and chili powder. The items listed above may not be a complete list of foods and drinks to avoid. Contact your dietitian for more information.    This information is not intended to replace advice given to you by your health care provider. Make sure you discuss any questions you have with your health care provider. Document Released: 09/03/2011 Document Revised: 08/10/2015 Document Reviewed: 01/06/2013 Elsevier Interactive Patient Education  2017 Elsevier Inc.  

## 2017-09-25 NOTE — Telephone Encounter (Signed)
diclofenac refill request 

## 2017-09-25 NOTE — Progress Notes (Signed)
Patient ID: [name], female   DOB: [birth year], 84 y.o.   MRN: [id number]     Subjective:  I acted as a Neurosurgeon for Dr. Zola Button.  Apolonio Schneiders, CMA   Patient ID: [name], female    DOB: 1933/10/11, 84 y.o.   MRN: [id number]  Chief Complaint  Patient presents with  . follow up epigastric pain    still having nausea  . concerns about [place]    HPI  Patient is in today for follow epigastric pain.  She is still having nausea.  Patient Care Team: Zola Button, Grayling Congress, DO as PCP - General   Past Medical History:  Diagnosis Date  . Allergic drug reaction    Opioids  . 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 Back pain, thoracic   . Benign familial tremor   . Bruise   . Calf pain   . Chest pain    -- Myoview negative 2007  . Chronic pain syndrome   . Depression   . Edema   . Gallstones   . Hypertension   . Hypokalemia   . Meniscus tear   . Myalgia   . Osteoarthritis   . Other symptoms referable to lower leg joint   . Other urinary incontinence   . Scoliosis   . Skin rash   . Tinea corporis     Past Surgical History:  Procedure Laterality Date  . ABDOMINAL HYSTERECTOMY    . BREAST LUMPECTOMY    . KNEE ARTHROSCOPY     Bilateral  . LUMBAR LAMINECTOMY/DECOMPRESSION MICRODISCECTOMY  2007   L3-4,4-5, L5hemilaminectomy with leopard cage internal fiaxation L3-5  . ROTATOR CUFF REPAIR     Right    Family History  Problem Relation Age of Onset  . Stroke Mother   . Parkinsonism Mother     Social History   Socioeconomic History  . Marital status: Widowed    Spouse name: Not on file  . Number of children: 3  . Years of education: 16  . Highest education level: Not on file  Occupational History  . Occupation: Midwife: [employer]  Social Needs  . Financial resource strain: Not on file  . Food insecurity:    Worry: Not on file    Inability: Not on file  . Transportation  needs:    Medical: Not on file    Non-medical: Not on file  Tobacco Use  . Smoking status: Never Smoker  . Smokeless tobacco: Never Used  Substance and Sexual Activity  . Alcohol use: No  . Drug use: No  . Sexual activity: Never    Partners: Male  Lifestyle  . Physical activity:    Days per week: Not on file    Minutes per session: Not on file  . Stress: Not on file  Relationships  . Social connections:    Talks on phone: Not on file    Gets together: Not on file    Attends religious service: Not on file    Active member of club or organization: Not on file    Attends meetings of clubs or organizations: Not on file    Relationship status: Not on file  . Intimate partner violence:    Fear of current or ex partner: Not on file    Emotionally abused: Not on file    Physically abused: Not on file    Forced sexual activity: Not on file  Other Topics Concern  . Not on file  Social History Narrative  . Not on file    Outpatient Medications Prior to Visit  Medication Sig Dispense Refill  . amantadine (SYMMETREL) 100 MG capsule Take 1 capsule (100 mg total) by mouth 2 (two) times daily. (Patient taking differently: Take 100 mg by mouth daily. ) 60 capsule 5  . amLODipine-valsartan (EXFORGE) 10-320 MG tablet TAKE 1 TABLET BY MOUTH ONCE DAILY 90 tablet 0  . carbidopa-levodopa (SINEMET) 25-100 MG per tablet Take 5 tablets by mouth 4 (four) times daily.     Marland Kitchen. desoximetasone (TOPICORT) 0.05 % cream APPLY TO AFFECTED AREA TWICE A DAY 60 g 1  . pravastatin (PRAVACHOL) 20 MG tablet take 1 tablet by mouth once daily 90 tablet 1  . primidone (MYSOLINE) 50 MG tablet Take 100 mg by mouth at bedtime.   0  . FLUoxetine (PROZAC) 40 MG capsule TAKE 1 CAPSULE BY MOUTH EVERY DAY 30 capsule 0  . OLANZapine (ZYPREXA) 10 MG tablet TAKE 1 TABLET BY MOUTH EVERY DAY 30 tablet 0  . pantoprazole (PROTONIX) 40 MG tablet Take 1 tablet (40 mg total) by mouth daily. 30 tablet 3  . ranitidine (ZANTAC) 150 MG  tablet TAKE 1 TABLET BY MOUTH TWICE DAILY 60 tablet 0   No facility-administered medications prior to visit.     Allergies  Allergen Reactions  . Codeine Nausea And Vomiting  . Gabapentin Swelling    Caused Bilateral feet swelling  . Latex Other (See Comments)    Burns the skin  . Morphine Sulfate Nausea And Vomiting  . Sertraline Hcl     Unknown reaction  . Penicillins Rash    Review of Systems  Constitutional: Negative for fever and malaise/fatigue.  HENT: Negative for congestion.   Eyes: Negative for blurred vision.  Respiratory: Negative for cough and shortness of breath.   Cardiovascular: Negative for chest pain, palpitations and leg swelling.  Gastrointestinal: Positive for nausea. Negative for vomiting.  Musculoskeletal: Negative for back pain.  Skin: Negative for rash.  Neurological: Negative for loss of consciousness and headaches.       Objective:    Physical Exam  Constitutional: She is oriented to person, place, and time. She appears well-developed and well-nourished.  HENT:  Head: Normocephalic and atraumatic.  Eyes: Conjunctivae and EOM are normal.  Neck: Normal range of motion. Neck supple. No JVD present. Carotid bruit is not present. No thyromegaly present.  Cardiovascular: Normal rate, regular rhythm and normal heart sounds.  No murmur heard. Pulmonary/Chest: Effort normal and breath sounds normal. No respiratory distress. She has no wheezes. She has no rales. She exhibits no tenderness.  Musculoskeletal: She exhibits no edema.  Neurological: She is alert and oriented to person, place, and time.  Psychiatric: She has a normal mood and affect.  Nursing note and vitals reviewed.   BP (!) 144/71   Pulse 67   Temp 98.6 F (37 C) (Oral)   Resp 16   Ht 5\' 5"  (1.651 m)   Wt 270 lb 9.6 oz (122.7 kg)   SpO2 97%   BMI 45.03 kg/m  Wt Readings from Last 3 Encounters:  09/25/17 270 lb 9.6 oz (122.7 kg)  09/10/17 274 lb (124.3 kg)  04/23/17 281 lb  (127.5 kg)   BP Readings from Last 3 Encounters:  09/25/17 (!) 144/71  09/10/17 126/82  04/23/17 121/72     Immunization History  Administered Date(s) Administered  . Influenza Split 01/16/2012  . Influenza Whole  12/09/2007  . Influenza, High Dose Seasonal PF 01/19/2013, 01/18/2015, 01/12/2016, 11/14/2016  . Influenza-Unspecified 12/30/2013  . Pneumococcal Conjugate-13 05/03/2014  . Pneumococcal Polysaccharide-23 03/18/2002    Health Maintenance  Topic Date Due  . TETANUS/TDAP  02/24/1955  . INFLUENZA VACCINE  10/16/2017  . DEXA SCAN  Completed  . PNA vac Low Risk Adult  Completed       Lab Results  Component Value Date   WBC 4.7 09/10/2017   HGB 11.9 (L) 09/10/2017   HCT 36.7 09/10/2017   MCV 88.7 09/10/2017   PLT 303.0 09/10/2017   Lab Results  Component Value Date   NA 141 09/10/2017   K 4.7 09/10/2017   CO2 29 09/10/2017   GLUCOSE 105 (H) 09/10/2017   BUN 21 09/10/2017   CREATININE 0.93 09/10/2017   BILITOT 0.5 09/10/2017   ALKPHOS 88 09/10/2017   AST 6 09/10/2017   ALT 4 09/10/2017   PROT 6.8 09/10/2017   ALBUMIN 4.3 09/10/2017   CALCIUM 9.2 09/10/2017   GFR 74.31 09/10/2017   Lab Results  Component Value Date   CHOL 130 02/28/2017   Lab Results  Component Value Date   HDL 44.20 02/28/2017   Lab Results  Component Value Date   LDLCALC 71 02/28/2017   Lab Results  Component Value Date   TRIG 71.0 02/28/2017   Lab Results  Component Value Date   CHOLHDL 3 02/28/2017   Lab Results  Component Value Date   HGBA1C 6.2 05/11/2014         Assessment & Plan:   Problem List Items Addressed This Visit      Unprioritized   Aortic ectasia (HCC)    D/w pt  Will repeat in 1 year and pt and daughter request      Dyspepsia - Primary    Take protonix bid Bland diet --- see HO Refer to [facility] reviewed       Relevant Medications   pantoprazole (PROTONIX) 40 MG tablet   Other Relevant Orders   Ambulatory referral to  Gastroenterology    Other Visit Diagnoses    Bilateral primary osteoarthritis of knee       Relevant Medications   diclofenac sodium (VOLTAREN) 1 % GEL   Nausea       Relevant Orders   Ambulatory referral to Gastroenterology      I have discontinued Joanette Gula. Killough's ranitidine. I have also changed her pantoprazole. Additionally, I am having her start on diclofenac sodium. Lastly, I am having her maintain her carbidopa-levodopa, amantadine, desoximetasone, primidone, pravastatin, and amLODipine-valsartan.  Meds ordered this encounter  Medications  . diclofenac sodium (VOLTAREN) 1 % GEL    Sig: Apply 4 g topically 4 (four) times daily.    Dispense:  100 g    Refill:  2  . pantoprazole (PROTONIX) 40 MG tablet    Sig: Take 1 tablet (40 mg total) by mouth 2 (two) times daily.    Dispense:  60 tablet    Refill:  3    CMA served as scribe during this visit. History, Physical and Plan performed by medical provider. Documentation and orders reviewed and attested to.  Donato Schultz, DO

## 2017-09-25 NOTE — Assessment & Plan Note (Signed)
Take protonix bid Bland diet --- see HO Refer to [facility] reviewed

## 2017-10-10 ENCOUNTER — Ambulatory Visit (INDEPENDENT_AMBULATORY_CARE_PROVIDER_SITE_OTHER): Payer: Medicare Other | Admitting: Gastroenterology

## 2017-10-10 ENCOUNTER — Encounter: Payer: Self-pay | Admitting: Gastroenterology

## 2017-10-10 VITALS — BP 118/68 | HR 61 | Ht 65.0 in | Wt 269.5 lb

## 2017-10-10 DIAGNOSIS — R110 Nausea: Secondary | ICD-10-CM

## 2017-10-10 DIAGNOSIS — R1013 Epigastric pain: Secondary | ICD-10-CM | POA: Diagnosis not present

## 2017-10-10 NOTE — Progress Notes (Signed)
Chief Complaint: Epigastric pain  Referring Provider:  Zola ButtonLowne Chase, Grayling CongressYvonne R, *      ASSESSMENT AND PLAN;   #1.  Epigastric pain: neg US 2019, negative H. pylori breath test, normal CBC, CMP and lipase.  Off all nonsteroidals. Epigastric pain. D/d PUD, GERD, gastritis, nonulcer dyspepsia, gastroparesis, musculoskeletal etiology, r/o gallbladder or pancreatic problems. Plan: -Continue Protonix 40 mg p.o. twice daily. -Proceed with EGD for further evaluation. -If negative, will consider HIDA scan with ejection fraction possibly followed by a CT scan of the abdomen pelvis.  If still with problems, will consider solid-phase gastric emptying scan -I have reassured the patient and patient's daughter. -Avoid nonsteroidals for now.  #2.  Associated nausea. #3.  GERD   HPI:    [name] is a 84 y.o. female  With epigastric pain associated with nausea over the last 2 months Denies having any significant exacerbation with food. No melena or hematochezia Has been on diclofenac which has been stopped Zantac switched to Protonix 40 mg p.o. twice daily with some relief. No odynophagia or dysphagia No recent weight loss No fever chills, jaundice dark urine or pale stools. Denies having any diarrhea constipation. Never had EGD. Had colonoscopy several years ago by Dr. Jarold MottoPatterson which was negative.   Past Medical History:  Diagnosis Date  . Allergic drug reaction    Opioids  . 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 Back pain, thoracic   . Benign familial tremor   . Bruise   . Calf pain   . Chest pain    -- Myoview negative 2007  . Chronic pain syndrome   . Depression   . Edema   . Gallstones   . Hypertension   . Hypokalemia   . Meniscus tear   . Myalgia   . Osteoarthritis   . Other symptoms referable to lower leg joint   . Other urinary incontinence   . Scoliosis   . Skin rash   . Tinea corporis     Past Surgical  History:  Procedure Laterality Date  . ABDOMINAL HYSTERECTOMY    . BREAST LUMPECTOMY    . KNEE ARTHROSCOPY     Bilateral  . LUMBAR LAMINECTOMY/DECOMPRESSION MICRODISCECTOMY  2007   L3-4,4-5, L5hemilaminectomy with leopard cage internal fiaxation L3-5  . ROTATOR CUFF REPAIR     Right    Family History  Problem Relation Age of Onset  . Stroke Mother   . Parkinsonism Mother     Social History  Retired Programmer, systemseducator, has 3 girls Tobacco Use  . Smoking status: Never Smoker  . Smokeless tobacco: Never Used  Substance Use Topics  . Alcohol use: No  . Drug use: No    Current Outpatient Medications  Medication Sig Dispense Refill  . amantadine (SYMMETREL) 100 MG capsule Take 1 capsule (100 mg total) by mouth 2 (two) times daily. (Patient taking differently: Take 100 mg by mouth daily. ) 60 capsule 5  . amLODipine-valsartan (EXFORGE) 10-320 MG tablet TAKE 1 TABLET BY MOUTH ONCE DAILY 90 tablet 0  . carbidopa-levodopa (SINEMET) 25-100 MG per tablet Take 5 tablets by mouth 4 (four) times daily.     Marland Kitchen. desoximetasone (TOPICORT) 0.05 % cream APPLY TO AFFECTED AREA TWICE A DAY 60 g 1  . diclofenac (VOLTAREN) 75 MG EC tablet TAKE 1 TABLET BY MOUTH TWICE DAILY WITH FOOD\ 60 tablet 0  . diclofenac sodium (VOLTAREN) 1 % GEL Apply 4 g topically  4 (four) times daily. 100 g 2  . FLUoxetine (PROZAC) 40 MG capsule TAKE 1 CAPSULE BY MOUTH EVERY DAY 90 capsule 1  . OLANZapine (ZYPREXA) 10 MG tablet TAKE 1 TABLET BY MOUTH EVERY DAY 90 tablet 1  . pantoprazole (PROTONIX) 40 MG tablet Take 1 tablet (40 mg total) by mouth 2 (two) times daily. 60 tablet 3  . pravastatin (PRAVACHOL) 20 MG tablet take 1 tablet by mouth once daily 90 tablet 1  . primidone (MYSOLINE) 50 MG tablet Take 100 mg by mouth at bedtime.   0   No current facility-administered medications for this visit.     Allergies  Allergen Reactions  . Codeine Nausea And Vomiting  . Gabapentin Swelling    Caused Bilateral feet swelling  . Latex  Other (See Comments)    Burns the skin  . Morphine Sulfate Nausea And Vomiting  . Sertraline Hcl     Unknown reaction  . Penicillins Rash    Review of Systems:  Constitutional: Denies fever, chills, diaphoresis, appetite change and fatigue.  HEENT: Denies photophobia, eye pain, redness, hearing loss, ear pain, congestion, sore throat, rhinorrhea, sneezing, mouth sores, neck pain, neck stiffness and tinnitus.   Respiratory: Denies SOB, DOE, cough, chest tightness,  and wheezing.   Cardiovascular: Denies chest pain, palpitations and leg swelling.  Genitourinary: Denies dysuria, urgency, frequency, hematuria, flank pain and difficulty urinating.  Musculoskeletal: Denies myalgias, back pain, joint swelling, arthralgias and has  gait problem, has Parkinson's.  Skin: No rash.  Neurological: Denies dizziness, seizures, syncope, weakness, light-headedness, numbness and headaches.  Hematological: Denies adenopathy. Easy bruising, personal or family bleeding history  Psychiatric/Behavioral: No anxiety or depression     Physical Exam:    BP 118/68   Pulse 61   Ht 5\' 5"  (1.651 m)   Wt 269 lb 8 oz (122.2 kg)   SpO2 94%   BMI 44.85 kg/m  Filed Weights   10/10/17 1045  Weight: 269 lb 8 oz (122.2 kg)   Constitutional:  Well-developed, in no acute distress. Psychiatric: Normal mood and affect. Behavior is normal. HEENT: Pupils normal.  Conjunctivae are normal. No scleral icterus. Neck supple.  Cardiovascular: Normal rate, regular rhythm. No edema Pulmonary/chest: Effort normal and breath sounds normal. No wheezing, rales or rhonchi. Abdominal: Soft, nondistended. Nontender. Bowel sounds active throughout. There are no masses palpable. No hepatomegaly. Rectal:  defered Neurological: Alert and oriented to person place and time. Skin: Skin is warm and dry. No rashes noted.  Data Reviewed: I have personally reviewed following labs and imaging studies  CBC: CBC Latest Ref Rng & Units  09/10/2017 08/18/2015 05/11/2014  WBC 4.0 - 10.5 K/uL 4.7 5.3 5.5  Hemoglobin 12.0 - 15.0 g/dL 11.9(L) 12.5 12.5  Hematocrit 36.0 - 46.0 % 36.7 38.1 37.3  Platelets 150.0 - 400.0 K/uL 303.0 354.0 316.0    CMP: CMP Latest Ref Rng & Units 09/10/2017 02/28/2017 08/18/2015  Glucose 70 - 99 mg/dL 161(W) 960(A) 540(J)  BUN 6 - 23 mg/dL 21 16 22   Creatinine 0.40 - 1.20 mg/dL 8.11 9.14 7.82  Sodium 135 - 145 mEq/L 141 142 142  Potassium 3.5 - 5.1 mEq/L 4.7 4.4 4.0  Chloride 96 - 112 mEq/L 105 106 106  CO2 19 - 32 mEq/L 29 31 28   Calcium 8.4 - 10.5 mg/dL 9.2 9.0 9.2  Total Protein 6.0 - 8.3 g/dL 6.8 7.1 7.2  Total Bilirubin 0.2 - 1.2 mg/dL 0.5 0.4 0.4  Alkaline Phos 39 - 117 U/L 88  85 101  AST 0 - 37 U/L 6 9 8   ALT 0 - 35 U/L 4 3 1      Radiology Studies: US Abdomen Complete  Result Date: 09/11/2017 CLINICAL DATA:  Epigastric pain. EXAM: ABDOMEN ULTRASOUND COMPLETE COMPARISON:  Abdominal ultrasound dated November 20, 2011. FINDINGS: Gallbladder: Small amount of sludge. No gallstones or wall thickening visualized. No sonographic Murphy sign noted by sonographer. Common bile duct: Diameter: 7 mm, normal for age. Liver: No focal lesion identified. Increased in parenchymal echogenicity. Portal vein is patent on color Doppler imaging with normal direction of blood flow towards the liver. IVC: No abnormality visualized. Pancreas: Visualized portion unremarkable. Spleen: Size and appearance within normal limits. Right Kidney: Length: 12.6 cm. Echogenicity within normal limits. No mass or hydronephrosis visualized. Two simple cysts measuring up to 6.6 cm. Left Kidney: Length: 12.0 cm. Echogenicity within normal limits. No mass or hydronephrosis visualized. Two simple cysts measuring up to 4.1 cm. Abdominal aorta: No aneurysm visualized. Mild ectasia of the suprarenal abdominal aorta, measuring 2.5 cm. Other findings: None. IMPRESSION: 1. Gallbladder sludge. No sonographic evidence of acute cholecystitis. 2.  Bilateral renal cysts. 3. Ectasia of the suprarenal abdominal aorta, measuring 2.5 cm. At risk for aneurysm development. Recommend followup by ultrasound in 5 years. This recommendation follows ACR consensus guidelines: White Paper of the ACR Incidental Findings Committee II on Vascular Findings. J Am Coll Radiol 2013; 10:789-794. Electronically Signed   By: Obie Dredge M.D.   On: 09/11/2017 17:12   Seen in presence of patient's daughter.    Edman Circle, MD 10/10/2017, 10:56 AM  Cc: Zola Button, Grayling Congress, *

## 2017-10-10 NOTE — Patient Instructions (Addendum)
If you are age 82 or older, your body mass index should be between 23-30. Your Body mass index is 44.85 kg/m. If this is out of the aforementioned range listed, please consider follow up with your Primary Care Provider.  If you are age 82 or younger, your body mass index should be between 19-25. Your Body mass index is 44.85 kg/m. If this is out of the aformentioned range listed, please consider follow up with your Primary Care Provider.   You have been scheduled for an endoscopy. Please follow written instructions given to you at your visit today. If you use inhalers (even only as needed), please bring them with you on the day of your procedure. Your physician has requested that you go to www.startemmi.com and enter the access code given to you at your visit today. This web site gives a general overview about your procedure. However, you should still follow specific instructions given to you by our office regarding your preparation for the procedure.  Thank you,  Dr. Lynann Bolognaajesh Gupta

## 2017-10-15 ENCOUNTER — Ambulatory Visit (AMBULATORY_SURGERY_CENTER): Payer: Medicare Other | Admitting: Gastroenterology

## 2017-10-15 ENCOUNTER — Encounter: Payer: Self-pay | Admitting: Gastroenterology

## 2017-10-15 VITALS — BP 190/79 | HR 60 | Temp 98.2°F | Resp 19 | Ht 65.0 in | Wt 269.0 lb

## 2017-10-15 DIAGNOSIS — R1013 Epigastric pain: Secondary | ICD-10-CM | POA: Diagnosis present

## 2017-10-15 DIAGNOSIS — K317 Polyp of stomach and duodenum: Secondary | ICD-10-CM

## 2017-10-15 DIAGNOSIS — K2990 Gastroduodenitis, unspecified, without bleeding: Secondary | ICD-10-CM | POA: Diagnosis not present

## 2017-10-15 DIAGNOSIS — K2970 Gastritis, unspecified, without bleeding: Secondary | ICD-10-CM | POA: Diagnosis not present

## 2017-10-15 MED ORDER — SODIUM CHLORIDE 0.9 % IV SOLN: 500.0000 mL | Freq: Once | INTRAVENOUS | Status: DC

## 2017-10-15 NOTE — Progress Notes (Signed)
Pt's states no medical or surgical changes since previsit or office visit. 

## 2017-10-15 NOTE — Addendum Note (Signed)
Addended by: Ginette PitmanSCOTT, Elison Worrel W on: 10/15/2017 05:30 PM   Modules accepted: Orders

## 2017-10-15 NOTE — Progress Notes (Signed)
Called to room to assist during endoscopic procedure.  Patient ID and intended procedure confirmed with present staff. Received instructions for my participation in the procedure from the performing physician.  

## 2017-10-15 NOTE — Patient Instructions (Signed)
YOU HAD AN ENDOSCOPIC PROCEDURE TODAY AT THE [facility]:   Refer to the procedure report that was given to you for any specific questions about what was found during the examination.  If the procedure report does not answer your questions, please call your gastroenterologist to clarify.  If you requested that your care partner not be given the details of your procedure findings, then the procedure report has been included in a sealed envelope for you to review at your convenience later.  YOU SHOULD EXPECT: Some feelings of bloating in the abdomen. Passage of more gas than usual.  Walking can help get rid of the air that was put into your GI tract during the procedure and reduce the bloating. If you had a lower endoscopy (such as a colonoscopy or flexible sigmoidoscopy) you may notice spotting of blood in your stool or on the toilet paper. If you underwent a bowel prep for your procedure, you may not have a normal bowel movement for a few days.  Please Note:  You might notice some irritation and congestion in your nose or some drainage.  This is from the oxygen used during your procedure.  There is no need for concern and it should clear up in a day or so.  SYMPTOMS TO REPORT IMMEDIATELY:   Following upper endoscopy (EGD)  Vomiting of blood or coffee ground material  New chest pain or pain under the shoulder blades  Painful or persistently difficult swallowing  New shortness of breath  Fever of 100F or higher  Black, tarry-looking stools  For urgent or emergent issues, a gastroenterologist can be reached at any hour by calling [phone].   DIET:  We do recommend a small meal at first, but then you may proceed to your regular diet.  Drink plenty of fluids but you should avoid alcoholic beverages for 24 hours.  ACTIVITY:  You should plan to take it easy for the rest of today and you should NOT DRIVE or use heavy machinery until tomorrow (because of the sedation medicines used  during the test).    FOLLOW UP: Our staff will call the number listed on your records the next business day following your procedure to check on you and address any questions or concerns that you may have regarding the information given to you following your procedure. If we do not reach you, we will leave a message.  However, if you are feeling well and you are not experiencing any problems, there is no need to return our call.  We will assume that you have returned to your regular daily activities without incident.  If any biopsies were taken you will be contacted by phone or by letter within the next 1-3 weeks.  Please call us at [phone] if you have not heard about the biopsies in 3 weeks.    SIGNATURES/CONFIDENTIALITY: You and/or your care partner have signed paperwork which will be entered into your electronic medical record.  These signatures attest to the fact that that the information above on your After Visit Summary has been reviewed and is understood.  Full responsibility of the confidentiality of this discharge information lies with you and/or your care-partner.  Gastritis, hiatal hernia  No Ibuprofen, naproxen or other non steroidal anti inflammatory meds for 5 days.

## 2017-10-15 NOTE — Op Note (Signed)
[place] Patient Name: [name] Procedure Date: 10/15/2017 4:34 PM MRN: [id number] Endoscopist: Jackquline Denmark , MD Age: 84 Referring MD:  Date of Birth: May 30, 1933 Gender: Female Account #: [id number] Procedure:                Upper GI endoscopy Indications:              Epigastric abdominal pain Medicines:                Monitored Anesthesia Care Procedure:                Pre-Anesthesia Assessment:                           - Prior to the procedure, a History and Physical                            was performed, and patient medications and                            allergies were reviewed. The patient is competent.                            The risks and benefits of the procedure and the                            sedation options and risks were discussed with the                            patient. All questions were answered and informed                            consent was obtained. Patient identification and                            proposed procedure were verified by the physician                            in the procedure room. Mental Status Examination:                            alert and oriented. Prophylactic Antibiotics: The                            patient does not require prophylactic antibiotics.                            Prior Anticoagulants: The patient has taken no                            previous anticoagulant or antiplatelet agents. ASA                            Grade Assessment: III - A patient with severe  systemic disease. After reviewing the risks and                            benefits, the patient was deemed in satisfactory                            condition to undergo the procedure. The anesthesia                            plan was to use monitored anesthesia care (MAC).                            Immediately prior to administration of medications,                            the patient was  re-assessed for adequacy to receive                            sedatives. The heart rate, respiratory rate, oxygen                            saturations, blood pressure, adequacy of pulmonary                            ventilation, and response to care were monitored                            throughout the procedure. The physical status of                            the patient was re-assessed after the procedure.                           After obtaining informed consent, the endoscope was                            passed under direct vision. Throughout the                            procedure, the patient's blood pressure, pulse, and                            oxygen saturations were monitored continuously. The                            Model GIF-[id number]) scope was introduced                            through the mouth, and advanced to the second part                            of duodenum. The upper GI endoscopy was  accomplished without difficulty. The patient                            tolerated the procedure well. Scope In: Scope Out: Findings:                 A small transient hiatal hernia was present.                           Diffuse mild inflammation characterized by                            congestion (edema) and erythema was found in the                            stomach. Biopsies were taken with a cold forceps                            for histology. Estimated blood loss was minimal.                           5-6, 6 to 8 mm sessile polyps with no stigmata of                            recent bleeding were found in the gastric body. Two                            polyps were removed with a hot snare. Resection and                            retrieval were complete. Estimated blood loss was                            minimal.                           The exam was otherwise without abnormality. Complications:            No  immediate complications. Estimated Blood Loss:     Estimated blood loss was minimal. Impression:               - Small hiatal hernia.                           - Mild gastritis.                           - Gastric polyps (status post polypectomy -2) Recommendation:           - Patient has a contact number available for                            emergencies. The signs and symptoms of potential                            delayed complications were discussed with the  patient. Return to normal activities tomorrow.                            Written discharge instructions were provided to the                            patient.                           - Resume previous diet.                           - Continue present medications.                           - Await pathology results.                           - No ibuprofen, naproxen, or other non-steroidal                            anti-inflammatory drugs for 5 days after polyp                            removal.                           - Return to GI clinic PRN. Jackquline Denmark, MD 10/15/2017 4:50:02 PM This report has been signed electronically.

## 2017-10-15 NOTE — Progress Notes (Signed)
A/ox3 pleased with MAC, report to RN 

## 2017-10-16 ENCOUNTER — Telehealth: Payer: Self-pay | Admitting: *Deleted

## 2017-10-16 NOTE — Telephone Encounter (Signed)
  Follow up Call-  Call back number 10/15/2017  Post procedure Call Back phone  # [phone]  Permission to leave phone message No  Some recent data might be hidden     Patient questions:  Do you have a fever, pain , or abdominal swelling? No. Pain Score  0 *  Have you tolerated food without any problems? Yes.    Have you been able to return to your normal activities? Yes.    Do you have any questions about your discharge instructions: Diet   No. Medications  No. Follow up visit  No.  Do you have questions or concerns about your Care? No.  Actions: * If pain score is 4 or above: No action needed, pain <4.

## 2017-10-18 ENCOUNTER — Telehealth: Payer: Self-pay | Admitting: Gastroenterology

## 2017-10-18 NOTE — Telephone Encounter (Signed)
This patient's daughter called tonight. She states the patient woke up with pain in between her shoulder blades in her back which is quite bothersome, and wonders if related to her EGD she had on 7/31. Chart reviewed, she had an EGD with hot snare of 2 small gastric polyps, otherwise no other significant pathology. The patient denies any nausea / vomiting / abdominal pain / shortness of breath / fever, just only has pain in her mid back that is persistent. Took an antacid but no better. Pain just started tonight and woke her from sleep.  Unclear what is causing her pain based on phone call. While possible, this is not typical for procedural related complication given this is mostly back pain, and specifically she denies any new abdominal pains, etc.. Not sure if this is musculoskeletal or cardiac or other. I recommend if the pain is that severe she should go to the ER for further evaluation and workup. Daughter agreed with the plan.

## 2017-10-25 ENCOUNTER — Other Ambulatory Visit (INDEPENDENT_AMBULATORY_CARE_PROVIDER_SITE_OTHER): Payer: Self-pay | Admitting: Specialist

## 2017-10-27 NOTE — Telephone Encounter (Signed)
Diclofenac refill request, can you please advise?

## 2017-11-02 ENCOUNTER — Encounter: Payer: Self-pay | Admitting: Gastroenterology

## 2017-11-04 ENCOUNTER — Other Ambulatory Visit: Payer: Self-pay | Admitting: Family Medicine

## 2017-11-04 DIAGNOSIS — E785 Hyperlipidemia, unspecified: Secondary | ICD-10-CM

## 2017-11-11 ENCOUNTER — Other Ambulatory Visit: Payer: Self-pay | Admitting: Family Medicine

## 2017-11-11 DIAGNOSIS — I10 Essential (primary) hypertension: Secondary | ICD-10-CM

## 2017-12-23 ENCOUNTER — Encounter: Payer: Self-pay | Admitting: Family Medicine

## 2017-12-23 ENCOUNTER — Ambulatory Visit (INDEPENDENT_AMBULATORY_CARE_PROVIDER_SITE_OTHER): Payer: Medicare Other | Admitting: Family Medicine

## 2017-12-23 VITALS — BP 126/76 | HR 70 | Temp 98.2°F | Resp 16 | Ht 65.0 in | Wt 254.0 lb

## 2017-12-23 DIAGNOSIS — G629 Polyneuropathy, unspecified: Secondary | ICD-10-CM

## 2017-12-23 DIAGNOSIS — R1013 Epigastric pain: Secondary | ICD-10-CM | POA: Diagnosis not present

## 2017-12-23 DIAGNOSIS — Z23 Encounter for immunization: Secondary | ICD-10-CM | POA: Diagnosis not present

## 2017-12-23 DIAGNOSIS — K449 Diaphragmatic hernia without obstruction or gangrene: Secondary | ICD-10-CM

## 2017-12-23 MED ORDER — PANTOPRAZOLE SODIUM 40 MG PO TBEC: 40.0000 mg | DELAYED_RELEASE_TABLET | Freq: Two times a day (BID) | ORAL | 3 refills | Status: AC

## 2017-12-23 NOTE — Patient Instructions (Signed)
Food Choices for Gastroesophageal Reflux Disease, Adult When you have gastroesophageal reflux disease (GERD), the foods you eat and your eating habits are very important. Choosing the right foods can help ease your discomfort. What guidelines do I need to follow?  Choose fruits, vegetables, whole grains, and low-fat dairy products.  Choose low-fat meat, fish, and poultry.  Limit fats such as oils, salad dressings, butter, nuts, and avocado.  Keep a food diary. This helps you identify foods that cause symptoms.  Avoid foods that cause symptoms. These may be different for everyone.  Eat small meals often instead of 3 large meals a day.  Eat your meals slowly, in a place where you are relaxed.  Limit fried foods.  Cook foods using methods other than frying.  Avoid drinking alcohol.  Avoid drinking large amounts of liquids with your meals.  Avoid bending over or lying down until 2-3 hours after eating. What foods are not recommended? These are some foods and drinks that may make your symptoms worse: Vegetables  Tomatoes. Tomato juice. Tomato and spaghetti sauce. Chili peppers. Onion and garlic. Horseradish. Fruits  Oranges, grapefruit, and lemon (fruit and juice). Meats  High-fat meats, fish, and poultry. This includes hot dogs, ribs, ham, sausage, salami, and bacon. Dairy  Whole milk and chocolate milk. Sour cream. Cream. Butter. Ice cream. Cream cheese. Drinks  Coffee and tea. Bubbly (carbonated) drinks or energy drinks. Condiments  Hot sauce. Barbecue sauce. Sweets/Desserts  Chocolate and cocoa. Donuts. Peppermint and spearmint. Fats and Oils  High-fat foods. This includes French fries and potato chips. Other  Vinegar. Strong spices. This includes black pepper, white pepper, red pepper, cayenne, curry powder, cloves, ginger, and chili powder. The items listed above may not be a complete list of foods and drinks to avoid. Contact your dietitian for more information.    This information is not intended to replace advice given to you by your health care provider. Make sure you discuss any questions you have with your health care provider. Document Released: 09/03/2011 Document Revised: 08/10/2015 Document Reviewed: 01/06/2013 Elsevier Interactive Patient Education  2017 Elsevier Inc.  

## 2017-12-23 NOTE — Progress Notes (Signed)
Patient ID: [name], female    DOB: 1934/01/22  Age: 84 y.o. MRN: [id number]    Subjective:  Subjective  HPI [name] presents for c/o worsening gerd symptoms/ epigastric pain .  Pt has not been able to eat / drink.due to pain.  She had egd with dr Chales Abrahams --- + hiatal hernia and benign polyps.    Review of Systems  Constitutional: Negative for fever.  HENT: Negative for congestion.   Respiratory: Negative for shortness of breath.   Cardiovascular: Negative for chest pain, palpitations and leg swelling.  Gastrointestinal: Positive for abdominal pain and nausea. Negative for abdominal distention and blood in stool.  Genitourinary: Negative for dysuria and frequency.  Skin: Negative for rash.  Allergic/Immunologic: Negative for environmental allergies.  Neurological: Negative for dizziness and headaches.  Psychiatric/Behavioral: The patient is not nervous/anxious.     History Past Medical History:  Diagnosis Date  . Allergic drug reaction    Opioids  . 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 Back pain, thoracic   . Benign familial tremor   . Bruise   . Calf pain   . Chest pain    -- Myoview negative 2007  . Chronic pain syndrome   . Depression   . Edema   . Gallstones   . Hypertension   . Hypokalemia   . Meniscus tear   . Myalgia   . Osteoarthritis   . Other symptoms referable to lower leg joint   . Other urinary incontinence   . Scoliosis   . Skin rash   . Tinea corporis     She has a past surgical history that includes Abdominal hysterectomy; Rotator cuff repair; Lumbar laminectomy/decompression microdiscectomy (2007); Knee arthroscopy; and Breast lumpectomy.   Her family history includes Parkinsonism in her mother; Stroke in her mother.She reports that she has never smoked. She has never used smokeless tobacco. She reports that she does not drink alcohol or use drugs.  Current Outpatient Medications on File  Prior to Visit  Medication Sig Dispense Refill  . amantadine (SYMMETREL) 100 MG capsule Take 1 capsule (100 mg total) by mouth 2 (two) times daily. (Patient taking differently: Take 100 mg by mouth daily. ) 60 capsule 5  . amLODipine-valsartan (EXFORGE) 10-320 MG tablet TAKE 1 TABLET BY MOUTH EVERY DAY 90 tablet 1  . carbidopa-levodopa (SINEMET) 25-100 MG per tablet Take 4 tablets by mouth 4 (four) times daily.     Marland Kitchen desoximetasone (TOPICORT) 0.05 % cream APPLY TO AFFECTED AREA TWICE A DAY 60 g 1  . diclofenac (VOLTAREN) 75 MG EC tablet TAKE 1 TABLET BY MOUTH TWICE DAILY WITH FOOD 180 tablet 0  . FLUoxetine (PROZAC) 40 MG capsule TAKE 1 CAPSULE BY MOUTH EVERY DAY 90 capsule 1  . OLANZapine (ZYPREXA) 10 MG tablet TAKE 1 TABLET BY MOUTH EVERY DAY 90 tablet 1  . pravastatin (PRAVACHOL) 20 MG tablet TAKE 1 TABLET BY MOUTH ONCE DAILY 90 tablet 0  . primidone (MYSOLINE) 50 MG tablet Take 100 mg by mouth at bedtime.   0   No current facility-administered medications on file prior to visit.      Objective:  Objective  Physical Exam  Constitutional: She is oriented to person, place, and time. She appears well-developed and well-nourished.  HENT:  Head: Normocephalic and atraumatic.  Eyes: Conjunctivae and EOM are normal.  Neck: Normal range of motion. Neck supple. No JVD present. Carotid bruit is not present.  No thyromegaly present.  Cardiovascular: Normal rate, regular rhythm and normal heart sounds.  No murmur heard. Pulmonary/Chest: Effort normal and breath sounds normal. No respiratory distress. She has no wheezes. She has no rales. She exhibits no tenderness.  Abdominal: There is tenderness in the epigastric area. There is guarding. There is no rebound. No hernia.  Musculoskeletal: She exhibits no edema.  Neurological: She is alert and oriented to person, place, and time.  Psychiatric: She has a normal mood and affect.  Nursing note and vitals reviewed.  BP 126/76 (BP Location: Left  Wrist, Cuff Size: Normal)   Pulse 70   Temp 98.2 F (36.8 C) (Oral)   Resp 16   Ht 5\' 5"  (1.651 m)   Wt 254 lb (115.2 kg)   SpO2 94%   BMI 42.27 kg/m  Wt Readings from Last 3 Encounters:  12/23/17 254 lb (115.2 kg)  10/15/17 269 lb (122 kg)  10/10/17 269 lb 8 oz (122.2 kg)       US Abdomen Complete  Result Date: 09/11/2017 CLINICAL DATA:  Epigastric pain. EXAM: ABDOMEN ULTRASOUND COMPLETE COMPARISON:  Abdominal ultrasound dated November 20, 2011. FINDINGS: Gallbladder: Small amount of sludge. No gallstones or wall thickening visualized. No sonographic Murphy sign noted by sonographer. Common bile duct: Diameter: 7 mm, normal for age. Liver: No focal lesion identified. Increased in parenchymal echogenicity. Portal vein is patent on color Doppler imaging with normal direction of blood flow towards the liver. IVC: No abnormality visualized. Pancreas: Visualized portion unremarkable. Spleen: Size and appearance within normal limits. Right Kidney: Length: 12.6 cm. Echogenicity within normal limits. No mass or hydronephrosis visualized. Two simple cysts measuring up to 6.6 cm. Left Kidney: Length: 12.0 cm. Echogenicity within normal limits. No mass or hydronephrosis visualized. Two simple cysts measuring up to 4.1 cm. Abdominal aorta: No aneurysm visualized. Mild ectasia of the suprarenal abdominal aorta, measuring 2.5 cm. Other findings: None. IMPRESSION: 1. Gallbladder sludge. No sonographic evidence of acute cholecystitis. 2. Bilateral renal cysts. 3. Ectasia of the suprarenal abdominal aorta, measuring 2.5 cm. At risk for aneurysm development. Recommend followup by ultrasound in 5 years. This recommendation follows ACR consensus guidelines: White Paper of the ACR Incidental Findings Committee II on Vascular Findings. J Am Coll Radiol 2013; 10:789-794. Electronically Signed   By: Obie Dredge M.D.   On: 09/11/2017 17:12     Assessment & Plan:  Plan  I have discontinued Tamma Brigandi. Bergeron's  diclofenac sodium. I have also changed her pantoprazole. Additionally, I am having her maintain her carbidopa-levodopa, amantadine, desoximetasone, primidone, FLUoxetine, OLANZapine, diclofenac, pravastatin, and amLODipine-valsartan.  Meds ordered this encounter  Medications  . pantoprazole (PROTONIX) 40 MG tablet    Sig: Take 1 tablet (40 mg total) by mouth 2 (two) times daily before a meal.    Dispense:  60 tablet    Refill:  3    Problem List Items Addressed This Visit      Unprioritized   Dyspepsia   Relevant Medications   pantoprazole (PROTONIX) 40 MG tablet    Other Visit Diagnoses    Epigastric pain    -  Primary   Relevant Orders   CBC with Differential/Platelet   H. pylori antibody, IgG   Comprehensive metabolic panel   Amylase   Lipase   Hiatal hernia       Neuropathy       Relevant Orders   Vitamin D (25 hydroxy)   Vitamin B12    restart the protonix---  Take bid  con't zantac or pepcid  F/u GI If pain worsens overnight---go to ER  Follow-up: Return if symptoms worsen or fail to improve.  Donato Schultz, DO

## 2017-12-24 LAB — COMPREHENSIVE METABOLIC PANEL
ALK PHOS: 105 U/L (ref 39–117)
ALT: 1 U/L (ref 0–35)
AST: 3 U/L (ref 0–37)
Albumin: 4.3 g/dL (ref 3.5–5.2)
BUN: 17 mg/dL (ref 6–23)
CHLORIDE: 103 meq/L (ref 96–112)
CO2: 29 mEq/L (ref 19–32)
CREATININE: 0.98 mg/dL (ref 0.40–1.20)
Calcium: 9.6 mg/dL (ref 8.4–10.5)
GFR: 69.9 mL/min (ref >60.00)
GLUCOSE: 95 mg/dL (ref 70–99)
Potassium: 4.5 mEq/L (ref 3.5–5.1)
SODIUM: 142 meq/L (ref 135–145)
TOTAL PROTEIN: 7 g/dL (ref 6.0–8.3)
Total Bilirubin: 0.6 mg/dL (ref 0.2–1.2)

## 2017-12-24 LAB — CBC WITH DIFFERENTIAL/PLATELET
BASOS ABS: 0 10*3/uL (ref 0.0–0.1)
Basophils Relative: 0.8 % (ref 0.0–3.0)
EOS ABS: 0.3 10*3/uL (ref 0.0–0.7)
Eosinophils Relative: 5.5 % — ABNORMAL HIGH (ref 0.0–5.0)
HCT: 39.2 % (ref 36.0–46.0)
HEMOGLOBIN: 12.5 g/dL (ref 12.0–15.0)
LYMPHS PCT: 24.3 % (ref 12.0–46.0)
Lymphs Abs: 1.3 10*3/uL (ref 0.7–4.0)
MCHC: 31.8 g/dL (ref 30.0–36.0)
MCV: 88.9 fl (ref 78.0–100.0)
MONO ABS: 0.7 10*3/uL (ref 0.1–1.0)
Monocytes Relative: 12.4 % — ABNORMAL HIGH (ref 3.0–12.0)
Neutro Abs: 3 10*3/uL (ref 1.4–7.7)
Neutrophils Relative %: 57 % (ref 43.0–77.0)
Platelets: 234 10*3/uL (ref 150.0–400.0)
RBC: 4.41 Mil/uL (ref 3.87–5.11)
RDW: 17.5 % — ABNORMAL HIGH (ref 11.5–15.5)
WBC: 5.3 10*3/uL (ref 4.0–10.5)

## 2017-12-24 LAB — H. PYLORI ANTIBODY, IGG: H PYLORI IGG: NEGATIVE

## 2017-12-24 LAB — VITAMIN B12: Vitamin B-12: 198 pg/mL — ABNORMAL LOW (ref 211–911)

## 2017-12-24 LAB — AMYLASE: Amylase: 25 U/L — ABNORMAL LOW (ref 27–131)

## 2017-12-24 LAB — LIPASE: Lipase: 15 U/L (ref 11.0–59.0)

## 2017-12-24 LAB — VITAMIN D 25 HYDROXY (VIT D DEFICIENCY, FRACTURES): VITD: 22.14 ng/mL — AB (ref 30.00–100.00)

## 2017-12-24 NOTE — Addendum Note (Signed)
Addended by: Thelma Barge D on: 12/24/2017 08:26 AM   Modules accepted: Orders

## 2017-12-28 ENCOUNTER — Encounter (HOSPITAL_COMMUNITY): Payer: Self-pay | Admitting: *Deleted

## 2017-12-28 ENCOUNTER — Emergency Department (HOSPITAL_COMMUNITY): Payer: Medicare Other

## 2017-12-28 ENCOUNTER — Inpatient Hospital Stay (HOSPITAL_COMMUNITY)
Admission: EM | Admit: 2017-12-28 | Discharge: 2018-01-02 | DRG: 193 | Disposition: A | Discharge status: Skilled Nursing Facility | Payer: Medicare Other | Attending: Internal Medicine | Admitting: Internal Medicine

## 2017-12-28 ENCOUNTER — Other Ambulatory Visit: Payer: Self-pay

## 2017-12-28 DIAGNOSIS — Z823 Family history of stroke: Secondary | ICD-10-CM

## 2017-12-28 DIAGNOSIS — Z888 Allergy status to other drugs, medicaments and biological substances status: Secondary | ICD-10-CM

## 2017-12-28 DIAGNOSIS — F329 Major depressive disorder, single episode, unspecified: Secondary | ICD-10-CM | POA: Diagnosis present

## 2017-12-28 DIAGNOSIS — Z885 Allergy status to narcotic agent status: Secondary | ICD-10-CM

## 2017-12-28 DIAGNOSIS — G894 Chronic pain syndrome: Secondary | ICD-10-CM | POA: Diagnosis present

## 2017-12-28 DIAGNOSIS — J9600 Acute respiratory failure, unspecified whether with hypoxia or hypercapnia: Secondary | ICD-10-CM | POA: Diagnosis present

## 2017-12-28 DIAGNOSIS — R9401 Abnormal electroencephalogram [EEG]: Secondary | ICD-10-CM | POA: Diagnosis present

## 2017-12-28 DIAGNOSIS — J181 Lobar pneumonia, unspecified organism: Principal | ICD-10-CM | POA: Diagnosis present

## 2017-12-28 DIAGNOSIS — E785 Hyperlipidemia, unspecified: Secondary | ICD-10-CM | POA: Diagnosis present

## 2017-12-28 DIAGNOSIS — R112 Nausea with vomiting, unspecified: Secondary | ICD-10-CM | POA: Diagnosis not present

## 2017-12-28 DIAGNOSIS — R0602 Shortness of breath: Secondary | ICD-10-CM | POA: Diagnosis present

## 2017-12-28 DIAGNOSIS — K219 Gastro-esophageal reflux disease without esophagitis: Secondary | ICD-10-CM | POA: Diagnosis present

## 2017-12-28 DIAGNOSIS — R630 Anorexia: Secondary | ICD-10-CM | POA: Diagnosis present

## 2017-12-28 DIAGNOSIS — E559 Vitamin D deficiency, unspecified: Secondary | ICD-10-CM | POA: Diagnosis present

## 2017-12-28 DIAGNOSIS — Z91040 Latex allergy status: Secondary | ICD-10-CM

## 2017-12-28 DIAGNOSIS — Z79899 Other long term (current) drug therapy: Secondary | ICD-10-CM

## 2017-12-28 DIAGNOSIS — R5381 Other malaise: Secondary | ICD-10-CM | POA: Diagnosis present

## 2017-12-28 DIAGNOSIS — Z90710 Acquired absence of both cervix and uterus: Secondary | ICD-10-CM

## 2017-12-28 DIAGNOSIS — I10 Essential (primary) hypertension: Secondary | ICD-10-CM | POA: Diagnosis present

## 2017-12-28 DIAGNOSIS — E876 Hypokalemia: Secondary | ICD-10-CM | POA: Diagnosis present

## 2017-12-28 DIAGNOSIS — R7989 Other specified abnormal findings of blood chemistry: Secondary | ICD-10-CM | POA: Diagnosis not present

## 2017-12-28 DIAGNOSIS — I77819 Aortic ectasia, unspecified site: Secondary | ICD-10-CM | POA: Diagnosis present

## 2017-12-28 DIAGNOSIS — F0280 Dementia in other diseases classified elsewhere without behavioral disturbance: Secondary | ICD-10-CM | POA: Diagnosis present

## 2017-12-28 DIAGNOSIS — R55 Syncope and collapse: Secondary | ICD-10-CM | POA: Diagnosis not present

## 2017-12-28 DIAGNOSIS — E538 Deficiency of other specified B group vitamins: Secondary | ICD-10-CM | POA: Diagnosis present

## 2017-12-28 DIAGNOSIS — G250 Essential tremor: Secondary | ICD-10-CM | POA: Diagnosis present

## 2017-12-28 DIAGNOSIS — Z882 Allergy status to sulfonamides status: Secondary | ICD-10-CM

## 2017-12-28 DIAGNOSIS — Z820 Family history of epilepsy and other diseases of the nervous system: Secondary | ICD-10-CM

## 2017-12-28 DIAGNOSIS — G20 Parkinson's disease: Secondary | ICD-10-CM | POA: Diagnosis present

## 2017-12-28 DIAGNOSIS — Z880 Allergy status to penicillin: Secondary | ICD-10-CM | POA: Diagnosis not present

## 2017-12-28 DIAGNOSIS — Z6841 Body Mass Index (BMI) 40.0 and over, adult: Secondary | ICD-10-CM | POA: Diagnosis not present

## 2017-12-28 DIAGNOSIS — N179 Acute kidney failure, unspecified: Secondary | ICD-10-CM | POA: Diagnosis not present

## 2017-12-28 DIAGNOSIS — J189 Pneumonia, unspecified organism: Secondary | ICD-10-CM | POA: Diagnosis present

## 2017-12-28 LAB — URINALYSIS, ROUTINE W REFLEX MICROSCOPIC
BILIRUBIN URINE: NEGATIVE
Glucose, UA: NEGATIVE mg/dL
HGB URINE DIPSTICK: NEGATIVE
KETONES UR: 5 mg/dL — AB
NITRITE: NEGATIVE
PROTEIN: NEGATIVE mg/dL
Specific Gravity, Urine: 1.015 (ref 1.005–1.030)
pH: 7 (ref 5.0–8.0)

## 2017-12-28 LAB — CBC WITH DIFFERENTIAL/PLATELET
ABS IMMATURE GRANULOCYTES: 0.02 10*3/uL (ref 0.00–0.07)
BASOS ABS: 0 10*3/uL (ref 0.0–0.1)
Basophils Relative: 0 %
Eosinophils Absolute: 0.4 10*3/uL (ref 0.0–0.5)
Eosinophils Relative: 6 %
HCT: 37.2 % (ref 36.0–46.0)
HEMOGLOBIN: 11.9 g/dL — AB (ref 12.0–15.0)
Immature Granulocytes: 0 %
LYMPHS ABS: 1 10*3/uL (ref 0.7–4.0)
Lymphocytes Relative: 18 %
MCH: 28.2 pg (ref 26.0–34.0)
MCHC: 32 g/dL (ref 30.0–36.0)
MCV: 88.2 fL (ref 80.0–100.0)
MONO ABS: 0.7 10*3/uL (ref 0.1–1.0)
Monocytes Relative: 13 %
NEUTROS ABS: 3.6 10*3/uL (ref 1.7–7.7)
Neutrophils Relative %: 63 %
Platelets: 230 10*3/uL (ref 150–400)
RBC: 4.22 MIL/uL (ref 3.87–5.11)
RDW: 16.8 % — ABNORMAL HIGH (ref 11.5–15.5)
WBC: 5.8 10*3/uL (ref 4.0–10.5)
nRBC: 0 % (ref 0.0–0.2)

## 2017-12-28 LAB — COMPREHENSIVE METABOLIC PANEL
ALBUMIN: 4 g/dL (ref 3.5–5.0)
ALT: 5 U/L (ref 0–44)
AST: 6 U/L — AB (ref 15–41)
Alkaline Phosphatase: 106 U/L (ref 38–126)
Anion gap: 12 (ref 5–15)
BUN: 16 mg/dL (ref 8–23)
CHLORIDE: 105 mmol/L (ref 98–111)
CO2: 27 mmol/L (ref 22–32)
Calcium: 9.2 mg/dL (ref 8.9–10.3)
Creatinine, Ser: 0.91 mg/dL (ref 0.44–1.00)
GFR calc Af Amer: >60 mL/min (ref >60)
GFR, EST NON AFRICAN AMERICAN: 58 mL/min — AB (ref >60)
Glucose, Bld: 105 mg/dL — ABNORMAL HIGH (ref 70–99)
POTASSIUM: 3.9 mmol/L (ref 3.5–5.1)
Sodium: 144 mmol/L (ref 135–145)
Total Bilirubin: 0.8 mg/dL (ref 0.3–1.2)
Total Protein: 7.2 g/dL (ref 6.5–8.1)

## 2017-12-28 LAB — MAGNESIUM: Magnesium: 1.5 mg/dL — ABNORMAL LOW (ref 1.7–2.4)

## 2017-12-28 LAB — BRAIN NATRIURETIC PEPTIDE: B Natriuretic Peptide: 27 pg/mL (ref 0.0–100.0)

## 2017-12-28 LAB — TROPONIN I

## 2017-12-28 MED ORDER — SODIUM CHLORIDE 0.9 % IV SOLN
500.0000 mg | Freq: Once | INTRAVENOUS | Status: DC
  Filled 2017-12-28: qty 500

## 2017-12-28 MED ORDER — ALBUTEROL SULFATE (2.5 MG/3ML) 0.083% IN NEBU
5.0000 mg | INHALATION_SOLUTION | Freq: Once | RESPIRATORY_TRACT | Status: AC
  Administered 2017-12-28: 5 mg via RESPIRATORY_TRACT
  Filled 2017-12-28: qty 6

## 2017-12-28 MED ORDER — ONDANSETRON HCL 4 MG/2ML IJ SOLN
4.0000 mg | Freq: Once | INTRAMUSCULAR | Status: AC
  Administered 2017-12-28: 4 mg via INTRAVENOUS

## 2017-12-28 MED ORDER — DOXYCYCLINE HYCLATE 100 MG PO TABS
100.0000 mg | ORAL_TABLET | Freq: Once | ORAL | Status: AC
  Administered 2017-12-28: 100 mg via ORAL
  Filled 2017-12-28: qty 1

## 2017-12-28 MED ORDER — DOXYCYCLINE HYCLATE 100 MG PO CAPS: 100.0000 mg | ORAL_CAPSULE | Freq: Two times a day (BID) | ORAL | 0 refills | Status: DC

## 2017-12-28 MED ORDER — VITAMIN B-12 1000 MCG PO TABS
2000.0000 ug | ORAL_TABLET | Freq: Every day | ORAL | Status: DC
  Administered 2017-12-29 – 2018-01-02 (×5): 2000 ug via ORAL
  Filled 2017-12-28 (×5): qty 2

## 2017-12-28 MED ORDER — PRIMIDONE 50 MG PO TABS
100.0000 mg | ORAL_TABLET | Freq: Every day | ORAL | Status: DC
  Administered 2017-12-28 – 2018-01-01 (×5): 100 mg via ORAL
  Filled 2017-12-28 (×5): qty 2

## 2017-12-28 MED ORDER — ENOXAPARIN SODIUM 60 MG/0.6ML ~~LOC~~ SOLN
60.0000 mg | SUBCUTANEOUS | Status: DC
  Administered 2017-12-28 – 2018-01-01 (×5): 60 mg via SUBCUTANEOUS
  Filled 2017-12-28 (×5): qty 0.6

## 2017-12-28 MED ORDER — OLANZAPINE 5 MG PO TABS
10.0000 mg | ORAL_TABLET | Freq: Every day | ORAL | Status: DC
  Administered 2017-12-29 – 2018-01-02 (×5): 10 mg via ORAL
  Filled 2017-12-28 (×5): qty 2

## 2017-12-28 MED ORDER — FAMOTIDINE 20 MG PO TABS
20.0000 mg | ORAL_TABLET | Freq: Two times a day (BID) | ORAL | Status: DC
  Administered 2017-12-28 – 2018-01-02 (×10): 20 mg via ORAL
  Filled 2017-12-28 (×10): qty 1

## 2017-12-28 MED ORDER — ONDANSETRON HCL 4 MG/2ML IJ SOLN
4.0000 mg | Freq: Four times a day (QID) | INTRAMUSCULAR | Status: DC | PRN
  Filled 2017-12-28: qty 2

## 2017-12-28 MED ORDER — PRAVASTATIN SODIUM 20 MG PO TABS
20.0000 mg | ORAL_TABLET | Freq: Every day | ORAL | Status: DC
  Administered 2017-12-29 – 2018-01-02 (×5): 20 mg via ORAL
  Filled 2017-12-28 (×5): qty 1

## 2017-12-28 MED ORDER — LEVOFLOXACIN IN D5W 750 MG/150ML IV SOLN
750.0000 mg | INTRAVENOUS | Status: DC
  Administered 2017-12-28: 750 mg via INTRAVENOUS
  Filled 2017-12-28: qty 150

## 2017-12-28 MED ORDER — SODIUM CHLORIDE 0.9% FLUSH
3.0000 mL | Freq: Two times a day (BID) | INTRAVENOUS | Status: DC
  Administered 2017-12-28 – 2018-01-02 (×9): 3 mL via INTRAVENOUS

## 2017-12-28 MED ORDER — CARBIDOPA-LEVODOPA 25-100 MG PO TABS
4.0000 | ORAL_TABLET | Freq: Four times a day (QID) | ORAL | Status: DC
  Administered 2017-12-28 – 2018-01-02 (×18): 4 via ORAL
  Filled 2017-12-28 (×18): qty 4

## 2017-12-28 MED ORDER — FLUOXETINE HCL 20 MG PO CAPS
40.0000 mg | ORAL_CAPSULE | Freq: Every day | ORAL | Status: DC
  Administered 2017-12-29 – 2018-01-02 (×5): 40 mg via ORAL
  Filled 2017-12-28 (×5): qty 2

## 2017-12-28 MED ORDER — AMANTADINE HCL 100 MG PO CAPS
100.0000 mg | ORAL_CAPSULE | Freq: Every day | ORAL | Status: DC
  Administered 2017-12-28 – 2018-01-01 (×5): 100 mg via ORAL
  Filled 2017-12-28 (×5): qty 1

## 2017-12-28 MED ORDER — SODIUM CHLORIDE 0.9 % IV BOLUS
1000.0000 mL | Freq: Once | INTRAVENOUS | Status: AC
  Administered 2017-12-28: 1000 mL via INTRAVENOUS

## 2017-12-28 MED ORDER — PANTOPRAZOLE SODIUM 40 MG PO TBEC
40.0000 mg | DELAYED_RELEASE_TABLET | Freq: Two times a day (BID) | ORAL | Status: DC
  Administered 2017-12-29 – 2018-01-02 (×9): 40 mg via ORAL
  Filled 2017-12-28 (×9): qty 1

## 2017-12-28 NOTE — ED Notes (Signed)
Patient transported to X-ray 

## 2017-12-28 NOTE — H&P (Signed)
History and Physical    [name] ZOX:[id number] DOB: Oct 19, 1933 DOA: 12/28/2017  PCP: Donato Schultz, DO  Patient coming from: home  I have personally briefly reviewed patient's old medical records in [facility]  Chief Complaint: SOB and malaise  HPI: [name] is a 84 y.o. female with medical history significant of parkinsonism, depression, hypertension, multiple other medical problems presenting with shortness of breath and malaise.   She notes that her symptoms started 2 to 3 weeks ago.  She notes shortness of breath primarily with exertion that has progressively gotten worse.  She can barely walk to the couch.  In addition she notes generalized weakness.  She is unable to stand for a prolonged period of time and this has also gotten progressively worse.  She has a history significant for Parkinson's and gets around with a walker.  She lives with her daughter, [name], at home.  She denies any fevers, notes chills, denies chest pain, denies abdominal pain.  She did have an episode of emesis here in the ED after taking 1 of her antibiotics.  She has chronic edema.  She denies any sick contacts or recent travel.    ED Course: Labs, imaging, abx.  Admit for CAP.  Review of Systems: As per HPI otherwise 10 point review of systems negative.   Past Medical History:  Diagnosis Date  . Allergic drug reaction    Opioids  . 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 Back pain, thoracic   . Benign familial tremor   . Bruise   . Calf pain   . Chest pain    -- Myoview negative 2007  . Chronic pain syndrome   . Depression   . Edema   . Gallstones   . Hypertension   . Hypokalemia   . Meniscus tear   . Myalgia   . Osteoarthritis   . Other symptoms referable to lower leg joint   . Other urinary incontinence   . Scoliosis   . Skin rash   . Tinea corporis     Past Surgical History:  Procedure Laterality Date  .  ABDOMINAL HYSTERECTOMY    . BREAST LUMPECTOMY    . KNEE ARTHROSCOPY     Bilateral  . LUMBAR LAMINECTOMY/DECOMPRESSION MICRODISCECTOMY  2007   L3-4,4-5, L5hemilaminectomy with leopard cage internal fiaxation L3-5  . ROTATOR CUFF REPAIR     Right     reports that she has never smoked. She has never used smokeless tobacco. She reports that she does not drink alcohol or use drugs.  Allergies  Allergen Reactions  . Codeine Nausea And Vomiting  . Gabapentin Swelling    Caused Bilateral feet swelling  . Latex Other (See Comments)    Burns the skin  . Morphine Sulfate Nausea And Vomiting  . Sertraline Hcl     Unknown reaction  . Penicillins Other (See Comments)    Has patient had a PCN reaction causing immediate rash, facial/tongue/throat swelling, SOB or lightheadedness with hypotension: Unknown Has patient had a PCN reaction causing severe rash involving mucus membranes or skin necrosis: Unknown Has patient had a PCN reaction that required hospitalization: Unknown Has patient had a PCN reaction occurring within the last 10 years: Unknown If all of the above answers are "NO", then may proceed with Cephalosporin use.     Family History  Problem Relation Age of Onset  . Stroke Mother   . Parkinsonism Mother  Prior to Admission medications   Medication Sig Start Date End Date Taking? Authorizing Provider  amantadine (SYMMETREL) 100 MG capsule Take 1 capsule (100 mg total) by mouth 2 (two) times daily. Patient taking differently: Take 100 mg by mouth at bedtime.  07/01/13  Yes Lowne Chase, Yvonne R, DO  amLODipine-valsartan (EXFORGE) 10-320 MG tablet TAKE 1 TABLET BY MOUTH EVERY DAY 11/13/17  Yes Seabron Spates R, DO  carbidopa-levodopa (SINEMET) 25-100 MG per tablet Take 4 tablets by mouth 4 (four) times daily.  07/14/10  Yes [provider]  Cholecalciferol (VITAMIN D3 PO) Take 1 tablet by mouth daily.   Yes [provider]  Cyanocobalamin (B-12) 2000 MCG TABS  Take 2,000 mg by mouth daily.   Yes [provider]  desoximetasone (TOPICORT) 0.05 % cream APPLY TO AFFECTED AREA TWICE A DAY Patient taking differently: Apply 1 application topically 2 (two) times daily.  01/16/16  Yes Seabron Spates R, DO  famotidine (PEPCID) 20 MG tablet Take 20 mg by mouth 2 (two) times daily.   Yes [provider]  FLUoxetine (PROZAC) 40 MG capsule TAKE 1 CAPSULE BY MOUTH EVERY DAY 09/25/17  Yes Zola Button, Yvonne R, DO  OLANZapine (ZYPREXA) 10 MG tablet TAKE 1 TABLET BY MOUTH EVERY DAY 09/25/17  Yes Seabron Spates R, DO  pantoprazole (PROTONIX) 40 MG tablet Take 1 tablet (40 mg total) by mouth 2 (two) times daily before a meal. 12/23/17  Yes Zola Button, Yvonne R, DO  pravastatin (PRAVACHOL) 20 MG tablet TAKE 1 TABLET BY MOUTH ONCE DAILY 11/04/17  Yes Seabron Spates R, DO  primidone (MYSOLINE) 50 MG tablet Take 100 mg by mouth at bedtime.  01/14/16  Yes [provider]  doxycycline (VIBRAMYCIN) 100 MG capsule Take 1 capsule (100 mg total) by mouth 2 (two) times daily for 7 days. 12/28/17 01/04/18  Gerhard Munch, MD    Physical Exam: Vitals:   12/28/17 1230 12/28/17 1430 12/28/17 1500 12/28/17 1734  BP: (!) 173/81 (!) 152/78 128/87 (!) 158/88  Pulse: 63 86 84 79  Resp: 13 (!) 22 12 13   Temp:    97.6 F (36.4 C)  TempSrc:    Oral  SpO2: 100% 100% 100% 100%  Weight:      Height:        Constitutional: NAD, calm, comfortable Vitals:   12/28/17 1230 12/28/17 1430 12/28/17 1500 12/28/17 1734  BP: (!) 173/81 (!) 152/78 128/87 (!) 158/88  Pulse: 63 86 84 79  Resp: 13 (!) 22 12 13   Temp:    97.6 F (36.4 C)  TempSrc:    Oral  SpO2: 100% 100% 100% 100%  Weight:      Height:       Eyes: PERRL, lids and conjunctivae normal ENMT: Mucous membranes are moist. Posterior pharynx clear of any exudate or lesions.Normal dentition.  Neck: normal, supple, no masses, no thyromegaly Respiratory: clear to auscultation bilaterally, no  wheezing, no crackles. Normal respiratory effort. No accessory muscle use.  Cardiovascular: Regular rate and rhythm, no murmurs / rubs / gallops. No extremity edema. 2+ pedal pulses. No carotid bruits.  Abdomen: no tenderness, no masses palpated. No hepatosplenomegaly. Bowel sounds positive.  Musculoskeletal: no clubbing / cyanosis. No joint deformity upper and lower extremities. Good ROM, no contractures. Normal muscle tone.  Skin: no rashes, lesions, ulcers. No induration Neurologic: CN 2-12 grossly intact. Sensation intact.  Moving all extremities equally.  Tremor present. Psychiatric: Normal judgment and insight. Alert and oriented  x 3. Normal mood.   Labs on Admission: I have personally reviewed following labs and imaging studies  CBC: Recent Labs  Lab 12/23/17 1701 12/28/17 1217  WBC 5.3 5.8  NEUTROABS 3.0 3.6  HGB 12.5 11.9*  HCT 39.2 37.2  MCV 88.9 88.2  PLT 234.0 230   Basic Metabolic Panel: Recent Labs  Lab 12/23/17 1701 12/28/17 1217  NA 142 144  K 4.5 3.9  CL 103 105  CO2 29 27  GLUCOSE 95 105*  BUN 17 16  CREATININE 0.98 0.91  CALCIUM 9.6 9.2   GFR: Estimated Creatinine Clearance: 61.5 mL/min (by C-G formula based on SCr of 0.91 mg/dL). Liver Function Tests: Recent Labs  Lab 12/23/17 1701 12/28/17 1217  AST 3 6*  ALT 1 5  ALKPHOS 105 106  BILITOT 0.6 0.8  PROT 7.0 7.2  ALBUMIN 4.3 4.0   Recent Labs  Lab 12/23/17 1701  LIPASE 15.0  AMYLASE 25*   No results for input(s): AMMONIA in the last 168 hours. Coagulation Profile: No results for input(s): INR, PROTIME in the last 168 hours. Cardiac Enzymes: Recent Labs  Lab 12/28/17 1217  TROPONINI <0.03   BNP (last 3 results) No results for input(s): PROBNP in the last 8760 hours. HbA1C: No results for input(s): HGBA1C in the last 72 hours. CBG: No results for input(s): GLUCAP in the last 168 hours. Lipid Profile: No results for input(s): CHOL, HDL, LDLCALC, TRIG, CHOLHDL, LDLDIRECT in the  last 72 hours. Thyroid Function Tests: No results for input(s): TSH, T4TOTAL, FREET4, T3FREE, THYROIDAB in the last 72 hours. Anemia Panel: No results for input(s): VITAMINB12, FOLATE, FERRITIN, TIBC, IRON, RETICCTPCT in the last 72 hours. Urine analysis:    Component Value Date/Time   COLORURINE YELLOW 12/28/2017 1135   APPEARANCEUR CLEAR 12/28/2017 1135   LABSPEC 1.015 12/28/2017 1135   PHURINE 7.0 12/28/2017 1135   GLUCOSEU NEGATIVE 12/28/2017 1135   HGBUR NEGATIVE 12/28/2017 1135   HGBUR negative 02/15/2008 1302   BILIRUBINUR NEGATIVE 12/28/2017 1135   BILIRUBINUR neg 08/18/2015 1459   KETONESUR 5 (A) 12/28/2017 1135   PROTEINUR NEGATIVE 12/28/2017 1135   UROBILINOGEN 0.2 08/18/2015 1459   UROBILINOGEN 0.2 08/02/2010 2217   NITRITE NEGATIVE 12/28/2017 1135   LEUKOCYTESUR TRACE (A) 12/28/2017 1135    Radiological Exams on Admission: Dg Chest 2 View  Result Date: 12/28/2017 CLINICAL DATA:  Difficulty breathing EXAM: CHEST - 2 VIEW COMPARISON:  02/28/2017 FINDINGS: Cardiac shadow is mildly enlarged but accentuated by the frontal technique. The lungs are well aerated bilaterally. Right middle lobe infiltrate is seen. No sizable effusion is noted. No bony abnormality is seen. IMPRESSION: Mild right middle lobe infiltrate. Electronically Signed   By: Alcide Clever M.D.   On: 12/28/2017 12:49    EKG: Independently reviewed. NSR.  LAD.  Compared to priors and appears similar.   Assessment/Plan Active Problems:   CAP (community acquired pneumonia)  Community Acquired Pneumonia:  Curb 65 1, but new O2 requirement.  She's had progressive SOB and malaise over the past 2-3 weeks.  CXR with mild RML infiltrate. Pt with penicillin allergy, family describes with rash, possible throat swelling.  I don't see any previous use of cephalosporin in chart.  Will use levaquin (discussed FQ risks). Blood cx, sputum cx Urine strep  Parkinson's: continue sinemet, amantadine, and  primidone  Nausea  Vomiting: occurred after pt took abx, continue to monitor  Hypertension: hold amlodipine-valsartan for now, follow BP's  Depression: continue prozac, zyprexa  Prolonged QTc: mildly  prolonged QTc, follow mag.  Given many QT prolonging meds above, follow in AM.  HLD: continue statin  GERD: continue PPI and H2 blocker, follow for deprescribing  DVT prophylaxis: lovenox  Code Status: full  Family Communication: daughters at bedside  Disposition Plan: pending  Consults called: none  Admission status: inpatient given CAP with new O2 requirement which in my medical opinion will likely require greater than 2 midnights of inpatient treatment for further evaluation and management given risk of decompensation.   Lacretia Nicks MD [facility] Pager [phone]  If 7PM-7AM, please contact night-coverage [email or web address] Password [facility]  12/28/2017, 6:26 PM

## 2017-12-28 NOTE — ED Notes (Signed)
[facility] has been called  

## 2017-12-28 NOTE — ED Notes (Signed)
ED TO INPATIENT HANDOFF REPORT  Name/Age/Gender [name] 84 y.o. female  Code Status   Home/SNF/Other Home  Chief Complaint SHoB  Level of Care/Admitting Diagnosis ED Disposition    ED Disposition Condition [place]: [facility] [100102]  Level of Care: Med-Surg [16]  Diagnosis: CAP (community acquired pneumonia) [093818]  Admitting Physician: Elodia Florence [phone]  Attending Physician: Cephus Slater, A CALDWELL [phone]  Estimated length of stay: past midnight tomorrow  Certification:: I certify this patient will need inpatient services for at least 2 midnights  PT Class (Do Not Modify): Inpatient [101]  PT Acc Code (Do Not Modify): Private [1]       Medical History Past Medical History:  Diagnosis Date  . Allergic drug reaction    Opioids  . Aortic ectasia ([place]) 09/13/2017   Supra-renal- needs follow up ultrasound in 2024  . Back pain, thoracic   . Benign familial tremor   . Bruise   . Calf pain   . Chest pain    -- Myoview negative 2007  . Chronic pain syndrome   . Depression   . Edema   . Gallstones   . Hypertension   . Hypokalemia   . Meniscus tear   . Myalgia   . Osteoarthritis   . Other symptoms referable to lower leg joint   . Other urinary incontinence   . Scoliosis   . Skin rash   . Tinea corporis     Allergies Allergies  Allergen Reactions  . Codeine Nausea And Vomiting  . Gabapentin Swelling    Caused Bilateral feet swelling  . Latex Other (See Comments)    Burns the skin  . Morphine Sulfate Nausea And Vomiting  . Sertraline Hcl     Unknown reaction  . Penicillins Other (See Comments)    Has patient had a PCN reaction causing immediate rash, facial/tongue/throat swelling, SOB or lightheadedness with hypotension: Unknown Has patient had a PCN reaction causing severe rash involving mucus membranes or skin necrosis: Unknown Has patient had a PCN reaction that required  hospitalization: Unknown Has patient had a PCN reaction occurring within the last 10 years: Unknown If all of the above answers are "NO", then may proceed with Cephalosporin use.     IV Location/Drains/Wounds Patient Lines/Drains/Airways Status   Active Line/Drains/Airways    Name:   Placement date:   Placement time:   Site:   Days:   Peripheral IV 12/28/17 Left;Anterior Forearm   12/28/17    1620    Forearm   less than 1          Labs/Imaging Results for orders placed or performed during the hospital encounter of 12/28/17 (from the past 48 hour(s))  Urinalysis, Routine w reflex microscopic     Status: Abnormal   Collection Time: 12/28/17 11:35 AM  Result Value Ref Range   Color, Urine YELLOW YELLOW   APPearance CLEAR CLEAR   Specific Gravity, Urine 1.015 1.005 - 1.030   pH 7.0 5.0 - 8.0   Glucose, UA NEGATIVE NEGATIVE mg/dL   Hgb urine dipstick NEGATIVE NEGATIVE   Bilirubin Urine NEGATIVE NEGATIVE   Ketones, ur 5 (A) NEGATIVE mg/dL   Protein, ur NEGATIVE NEGATIVE mg/dL   Nitrite NEGATIVE NEGATIVE   Leukocytes, UA TRACE (A) NEGATIVE   RBC / HPF 0-5 0 - 5 RBC/hpf   WBC, UA 0-5 0 - 5 WBC/hpf   Bacteria, UA RARE (A) NONE SEEN   Squamous  Epithelial / LPF 0-5 0 - 5    Comment: Performed at [facility], [place] [address] [place], [place] [address]  Comprehensive metabolic panel     Status: Abnormal   Collection Time: 12/28/17 12:17 PM  Result Value Ref Range   Sodium 144 135 - 145 mmol/L   Potassium 3.9 3.5 - 5.1 mmol/L   Chloride 105 98 - 111 mmol/L   CO2 27 22 - 32 mmol/L   Glucose, Bld 105 (H) 70 - 99 mg/dL   BUN 16 8 - 23 mg/dL   Creatinine, Ser 0.91 0.44 - 1.00 mg/dL   Calcium 9.2 8.9 - 10.3 mg/dL   Total Protein 7.2 6.5 - 8.1 g/dL   Albumin 4.0 3.5 - 5.0 g/dL   AST 6 (L) 15 - 41 U/L   ALT 5 0 - 44 U/L   Alkaline Phosphatase 106 38 - 126 U/L   Total Bilirubin 0.8 0.3 - 1.2 mg/dL   GFR calc non Af Amer 58 (L) >60 mL/min   GFR calc Af Amer >60 >60  mL/min    Comment: (NOTE) The eGFR has been calculated using the CKD EPI equation. This calculation has not been validated in all clinical situations. eGFR's persistently <60 mL/min signify possible Chronic Kidney Disease.    Anion gap 12 5 - 15    Comment: Performed at [facility], [place] [address] [place], [place] [address]  CBC with Differential     Status: Abnormal   Collection Time: 12/28/17 12:17 PM  Result Value Ref Range   WBC 5.8 4.0 - 10.5 K/uL   RBC 4.22 3.87 - 5.11 MIL/uL   Hemoglobin 11.9 (L) 12.0 - 15.0 g/dL   HCT 37.2 36.0 - 46.0 %   MCV 88.2 80.0 - 100.0 fL   MCH 28.2 26.0 - 34.0 pg   MCHC 32.0 30.0 - 36.0 g/dL   RDW 16.8 (H) 11.5 - 15.5 %   Platelets 230 150 - 400 K/uL   nRBC 0.0 0.0 - 0.2 %   Neutrophils Relative % 63 %   Neutro Abs 3.6 1.7 - 7.7 K/uL   Lymphocytes Relative 18 %   Lymphs Abs 1.0 0.7 - 4.0 K/uL   Monocytes Relative 13 %   Monocytes Absolute 0.7 0.1 - 1.0 K/uL   Eosinophils Relative 6 %   Eosinophils Absolute 0.4 0.0 - 0.5 K/uL   Basophils Relative 0 %   Basophils Absolute 0.0 0.0 - 0.1 K/uL   Immature Granulocytes 0 %   Abs Immature Granulocytes 0.02 0.00 - 0.07 K/uL    Comment: Performed at [facility], [place] [address] [place], [place] [address]  Troponin I     Status: None   Collection Time: 12/28/17 12:17 PM  Result Value Ref Range   Troponin I <0.03 <0.03 ng/mL    Comment: Performed at [facility], [place] [address], [place], [place] [address]  Brain natriuretic peptide     Status: None   Collection Time: 12/28/17 12:17 PM  Result Value Ref Range   B Natriuretic Peptide 27.0 0.0 - 100.0 pg/mL    Comment: Performed at [facility], [place] [address], [place], [place] [address]   Dg Chest 2 View  Result Date: 12/28/2017 CLINICAL DATA:  Difficulty breathing EXAM: CHEST - 2 VIEW COMPARISON:  02/28/2017 FINDINGS: Cardiac shadow is mildly enlarged but accentuated by  the frontal technique. The lungs are well aerated bilaterally. Right middle lobe infiltrate is  seen. No sizable effusion is noted. No bony abnormality is seen. IMPRESSION: Mild right middle lobe infiltrate. Electronically Signed   By: Inez Catalina M.D.   On: 12/28/2017 12:49   EKG Interpretation  Date/Time:  'Sunday December 28 2017 11:34:17 EDT Ventricular Rate:  70 PR Interval:    QRS Duration: 109 QT Interval:  450 QTC Calculation: 486 R Axis:   -35 Text Interpretation:  Sinus rhythm Prolonged PR interval Left axis deviation Borderline prolonged QT interval Abnormal ekg Confirmed by Lockwood, Robert ([id number]) on 12/28/2017 11:40:54 AM   Pending Labs Unresulted Labs (From admission, onward)    Start     Ordered   12/28/17 1638  Magnesium  Add-on,   R     12/28/17 1637   12/28/17 1636  HIV antibody (Routine Screening)  Once,   R     12/28/17 1637   12/28/17 1636  Culture, blood (routine x 2) Call MD if unable to obtain prior to antibiotics being given  BLOOD CULTURE X 2,   R    Comments:  If blood cultures drawn in Emergency Department - Do not draw and cancel order    12/28/17 1637   12/28/17 1636  Culture, sputum-assessment  Once,   R     12/28/17 1637   12/28/17 1636  Gram stain  Once,   R     12/28/17 1637   12/28/17 1636  Strep pneumoniae urinary antigen  Once,   R     10'$ /13/19 1637   Signed and Held  CBC  (enoxaparin (LOVENOX)    CrCl >/= 30 ml/min)  Once,   R    Comments:  Baseline for enoxaparin therapy IF NOT ALREADY DRAWN.  Notify MD if PLT < 100 K.    Signed and Held   Signed and Held  Creatinine, serum  (enoxaparin (LOVENOX)    CrCl >/= 30 ml/min)  Once,   R    Comments:  Baseline for enoxaparin therapy IF NOT ALREADY DRAWN.    Signed and Held   Signed and Held  Creatinine, serum  (enoxaparin (LOVENOX)    CrCl >/= 30 ml/min)  Weekly,   R    Comments:  while on enoxaparin therapy    Signed and Held   Signed and Held  Comprehensive metabolic panel  Tomorrow morning,    R     Signed and Held   Signed and Held  CBC  Tomorrow morning,   R     Signed and Held          Vitals/Pain Today's Vitals   12/28/17 1230 12/28/17 1430 12/28/17 1433 12/28/17 1500  BP: (!) 173/81 (!) 152/78  128/87  Pulse: 63 86  84  Resp: 13 (!) 22  12  Temp:      TempSrc:      SpO2: 100% 100%  100%  Weight:      Height:      PainSc:   0-No pain     Isolation Precautions No active isolations  Medications Medications  ondansetron (ZOFRAN) injection 4 mg (has no administration in time range)  sodium chloride 0.9 % bolus 1,000 mL (1,000 mLs Intravenous New Bag/Given 12/28/17 1637)  levofloxacin (LEVAQUIN) IVPB 750 mg (has no administration in time range)  albuterol (PROVENTIL) (2.5 MG/3ML) 0.083% nebulizer solution 5 mg (5 mg Nebulization Given 12/28/17 1228)  doxycycline (VIBRA-TABS) tablet 100 mg (100 mg Oral Given 12/28/17 1407)  ondansetron (ZOFRAN) injection 4 mg (4 mg Intravenous Given 12/28/17 1638)  Mobility walks with device

## 2017-12-28 NOTE — Progress Notes (Signed)
Pharmacy Antibiotic Note  [name] is a 84 y.o. female admitted on 12/28/2017 with pneumonia.  PMH signficant for Parkinson's Disease.  Pharmacy has been consulted for Levaquin dosing.  Plan: Levaquin 750mg  IV q24h Follow renal function  Height: 5\' 5"  (165.1 cm) Weight: 253 lb 15.5 oz (115.2 kg) IBW/kg (Calculated) : 57  Temp (24hrs), Avg:98 F (36.7 C), Min:98 F (36.7 C), Max:98 F (36.7 C)  Recent Labs  Lab 12/23/17 1701 12/28/17 1217  WBC 5.3 5.8  CREATININE 0.98 0.91    Estimated Creatinine Clearance: 61.5 mL/min (by C-G formula based on SCr of 0.91 mg/dL).    Allergies  Allergen Reactions  . Codeine Nausea And Vomiting  . Gabapentin Swelling    Caused Bilateral feet swelling  . Latex Other (See Comments)    Burns the skin  . Morphine Sulfate Nausea And Vomiting  . Sertraline Hcl     Unknown reaction  . Penicillins Other (See Comments)    Has patient had a PCN reaction causing immediate rash, facial/tongue/throat swelling, SOB or lightheadedness with hypotension: Unknown Has patient had a PCN reaction causing severe rash involving mucus membranes or skin necrosis: Unknown Has patient had a PCN reaction that required hospitalization: Unknown Has patient had a PCN reaction occurring within the last 10 years: Unknown If all of the above answers are "NO", then may proceed with Cephalosporin use.     Antimicrobials this admission: 10/13 Doxycycline x 1 dose  10/13 Levaquin  >>    Dose adjustments this admission:    Microbiology results: 10/13 BCx: sent 10/13 Sputum: sent   Thank you for allowing pharmacy to be a part of this patient's care.  Maryellen Pile, PharmD 12/28/2017 4:43 PM

## 2017-12-28 NOTE — ED Provider Notes (Addendum)
[facility]-EMERGENCY DEPT Provider Note   CSN: [id number] Arrival date & time: 12/28/17  1120     History   Chief Complaint Chief Complaint  Patient presents with  . Shortness of Breath    HPI [name] is a 84 y.o. female.  HPI Presents with her children who provide much of the HPI. Patient has Parkinson's disease, is soft-spoken, but seems to agree/corroborate details of the HPI. She presents due to worsening weakness, increasing dyspnea, time course of few weeks. No clear precipitant for the beginning of this illness, but now notes that over the past day or 2 the patient has been more weak than usual, with increasing dyspnea, particularly with exertion, minimal symptoms at rest. There is associated anorexia, nausea, but no vomiting, no diarrhea. No recent medication change, diet change.  Past Medical History:  Diagnosis Date  . Allergic drug reaction    Opioids  . 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 Back pain, thoracic   . Benign familial tremor   . Bruise   . Calf pain   . Chest pain    -- Myoview negative 2007  . Chronic pain syndrome   . Depression   . Edema   . Gallstones   . Hypertension   . Hypokalemia   . Meniscus tear   . Myalgia   . Osteoarthritis   . Other symptoms referable to lower leg joint   . Other urinary incontinence   . Scoliosis   . Skin rash   . Tinea corporis     Patient Active Problem List   Diagnosis Date Noted  . Dyspepsia 09/25/2017  . 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 Toe pain, right 11/17/2016  . Left shoulder pain 01/19/2016  . Hyperlipidemia 05/03/2014  . Disc disorder of lumbar region 12/30/2013  . Obesity (BMI 30-39.9) 07/01/2013  . Parkinson disease ([facility]) 09/17/2012  . Biliary dyskinesia 12/10/2011  . UTI (urinary tract infection) 07/24/2011  . Lumbar back pain 06/12/2011  . Symptomatic Parkinson disease ([facility]) 02/26/2011  . Depression  09/28/2010  . Pre-operative cardiovascular examination 07/23/2010  . CAROTID BRUIT, RIGHT 06/29/2009  . CHEST PAIN-UNSPECIFIED 01/13/2009  . SKIN RASH 10/11/2008  . EDEMA 08/25/2008  . OTHER URINARY INCONTINENCE 02/15/2008  . CHRONIC PAIN SYNDROME 12/31/2007  . BRUISE 09/14/2007  . SCOLIOSIS, LUMBAR SPINE 06/15/2007  . BACK PAIN, THORACIC REGION, RIGHT 06/11/2007  . MENISCUS TEAR May 06, 202008  . OTHER SYMPTOMS REFERABLE TO LOWER LEG JOINT 02/17/2007  . MYALGIA 02/10/2007  . CALF PAIN, RIGHT 02/10/2007  . DEPRESSION 01/01/2007  . HYPOKALEMIA, HX OF 01/01/2007  . TINEA CORPORIS 09/22/2006  . ALLERGIC DRUG REACTION, OPIOIDS 09/22/2006  . Essential hypertension 08/20/2006  . OSTEOARTHRITIS 08/20/2006    Past Surgical History:  Procedure Laterality Date  . ABDOMINAL HYSTERECTOMY    . BREAST LUMPECTOMY    . KNEE ARTHROSCOPY     Bilateral  . LUMBAR LAMINECTOMY/DECOMPRESSION MICRODISCECTOMY  2007   L3-4,4-5, L5hemilaminectomy with leopard cage internal fiaxation L3-5  . ROTATOR CUFF REPAIR     Right     OB History   None      Home Medications    Prior to Admission medications   Medication Sig Start Date End Date Taking? Authorizing Provider  amantadine (SYMMETREL) 100 MG capsule Take 1 capsule (100 mg total) by mouth 2 (two) times daily. Patient taking differently: Take 100 mg by mouth at bedtime.  07/01/13  Yes Zola Button,  Yvonne R, DO  amLODipine-valsartan (EXFORGE) 10-320 MG tablet TAKE 1 TABLET BY MOUTH EVERY DAY 11/13/17  Yes Seabron Spates R, DO  carbidopa-levodopa (SINEMET) 25-100 MG per tablet Take 4 tablets by mouth 4 (four) times daily.  07/14/10  Yes [provider]  Cholecalciferol (VITAMIN D3 PO) Take 1 tablet by mouth daily.   Yes [provider]  Cyanocobalamin (B-12) 2000 MCG TABS Take 2,000 mg by mouth daily.   Yes [provider]  desoximetasone (TOPICORT) 0.05 % cream APPLY TO AFFECTED AREA TWICE A DAY Patient taking  differently: Apply 1 application topically 2 (two) times daily.  01/16/16  Yes Seabron Spates R, DO  famotidine (PEPCID) 20 MG tablet Take 20 mg by mouth 2 (two) times daily.   Yes [provider]  FLUoxetine (PROZAC) 40 MG capsule TAKE 1 CAPSULE BY MOUTH EVERY DAY 09/25/17  Yes Zola Button, Yvonne R, DO  OLANZapine (ZYPREXA) 10 MG tablet TAKE 1 TABLET BY MOUTH EVERY DAY 09/25/17  Yes Seabron Spates R, DO  pantoprazole (PROTONIX) 40 MG tablet Take 1 tablet (40 mg total) by mouth 2 (two) times daily before a meal. 12/23/17  Yes Zola Button, Yvonne R, DO  pravastatin (PRAVACHOL) 20 MG tablet TAKE 1 TABLET BY MOUTH ONCE DAILY 11/04/17  Yes Seabron Spates R, DO  primidone (MYSOLINE) 50 MG tablet Take 100 mg by mouth at bedtime.  01/14/16  Yes [provider]  doxycycline (VIBRAMYCIN) 100 MG capsule Take 1 capsule (100 mg total) by mouth 2 (two) times daily for 7 days. 12/28/17 01/04/18  Gerhard Munch, MD    Family History Family History  Problem Relation Age of Onset  . Stroke Mother   . Parkinsonism Mother     Social History Social History   Tobacco Use  . Smoking status: Never Smoker  . Smokeless tobacco: Never Used  Substance Use Topics  . Alcohol use: No  . Drug use: No     Allergies   Codeine; Gabapentin; Latex; Morphine sulfate; Sertraline hcl; and Penicillins   Review of Systems Review of Systems  Constitutional:       Per HPI, otherwise negative  HENT:       Per HPI, otherwise negative  Respiratory:       Per HPI, otherwise negative  Cardiovascular:       Per HPI, otherwise negative  Gastrointestinal: Positive for nausea. Negative for vomiting.  Endocrine:       Negative aside from HPI  Genitourinary:       Neg aside from HPI   Musculoskeletal:       Per HPI, otherwise negative  Skin: Negative.   Neurological: Positive for tremors and weakness. Negative for syncope.     Physical Exam Updated Vital Signs BP (!) 154/106 (BP  Location: Left Wrist)   Pulse 70   Temp 98 F (36.7 C) (Oral)   Resp 17   Ht 5\' 5"  (1.651 m)   Wt 115.2 kg   SpO2 100%   BMI 42.26 kg/m   Physical Exam  Constitutional: She is oriented to person, place, and time. She has a sickly appearance. No distress.  Sickly yet obese elderly female awake, alert, participating minimally in the initial evaluation  HENT:  Head: Normocephalic and atraumatic.  Eyes: Conjunctivae and EOM are normal.  Cardiovascular: Normal rate and regular rhythm.  Pulmonary/Chest: No stridor. No respiratory distress. She has decreased breath sounds.  Abdominal: She exhibits no distension.  Musculoskeletal: She exhibits no  edema.  Neurological: She is alert and oriented to person, place, and time. She displays tremor. No cranial nerve deficit.  Skin: Skin is warm and dry.  Psychiatric: She is slowed and withdrawn.  Nursing note and vitals reviewed.    ED Treatments / Results  Labs (all labs ordered are listed, but only abnormal results are displayed) Labs Reviewed  COMPREHENSIVE METABOLIC PANEL - Abnormal; Notable for the following components:      Result Value   Glucose, Bld 105 (*)    AST 6 (*)    GFR calc non Af Amer 58 (*)    All other components within normal limits  CBC WITH DIFFERENTIAL/PLATELET - Abnormal; Notable for the following components:   Hemoglobin 11.9 (*)    RDW 16.8 (*)    All other components within normal limits  URINALYSIS, ROUTINE W REFLEX MICROSCOPIC - Abnormal; Notable for the following components:   Ketones, ur 5 (*)    Leukocytes, UA TRACE (*)    Bacteria, UA RARE (*)    All other components within normal limits  TROPONIN I  BRAIN NATRIURETIC PEPTIDE    EKG EKG Interpretation  Date/Time:  Sunday December 28 2017 11:34:17 EDT Ventricular Rate:  70 PR Interval:    QRS Duration: 109 QT Interval:  450 QTC Calculation: 486 R Axis:   -35 Text Interpretation:  Sinus rhythm Prolonged PR interval Left axis deviation  Borderline prolonged QT interval Abnormal ekg Confirmed by ,  ([id number]) on 12/28/2017 11:40:54 AM   Radiology Dg Chest 2 View  Result Date: 12/28/2017 CLINICAL DATA:  Difficulty breathing EXAM: CHEST - 2 VIEW COMPARISON:  02/28/2017 FINDINGS: Cardiac shadow is mildly enlarged but accentuated by the frontal technique. The lungs are well aerated bilaterally. Right middle lobe infiltrate is seen. No sizable effusion is noted. No bony abnormality is seen. IMPRESSION: Mild right middle lobe infiltrate. Electronically Signed   By: Mark  Lukens M.D.   On: 12/28/2017 12:49    Procedures Procedures (including critical care time)  Medications Ordered in ED Medications  doxycycline (VIBRA-TABS) tablet 100 mg (has no administration in time range)  albuterol (PROVENTIL) (2.5 MG/3ML) 0.083% nebulizer solution 5 mg (5 mg Nebulization Given 12/28/17 1228)  1:45 PM    Initial Impression / Assessment and Plan / ED Course  I have reviewed the triage vital signs and the nursing notes.  Pertinent labs & imaging results that were available during my care of the patient were reviewed by me and considered in my medical decision making (see chart for details).     1:45 PM On repeat exam patient is awake, alert, in no distress. No increased work of breathing, vitals generally reassuring, labs reassuring I discussed all this with the patient and her family members. We discussed concern for new right middle lobe infiltrate, initiated treatment for pneumonia,   3:40 PM Patient had episode of nausea with vomiting.  She has Artie received initial antibiotic, doxycycline, will receive azithromycin, but after this episode of vomiting, with concern for hypoxic, 88%, prior to initiation of 2 L nasal cannula, patient will require admission for this oxygen requirement, for new community acquired pneumonia.  Final Clinical Impressions(s) / ED Diagnoses   Final diagnoses:  Community acquired pneumonia of  right middle lobe of lung ([facility])    ED Discharge Orders         Ordered    doxycycline (VIBRAMYCIN) 100 MG capsule  2 times daily     10 /13/19 1344  Gerhard Munch, MD 12/28/17 1345    Gerhard Munch, MD 12/28/17 [phone]

## 2017-12-28 NOTE — ED Triage Notes (Signed)
EMS reports pt has Parkinson's disease, Breathing has become increasingly worse. Went to primary last week, nothing was done. 88% on arrival, placed on 4l [place]. Sats 100% on O2. 203/88 has not taken daily meds, 68 HR CBG 117. Family is coming.

## 2017-12-28 NOTE — Discharge Instructions (Signed)
Please be sure to monitor your condition carefully, and do not hesitate to return for concerning changes. Otherwise follow-up with your physician.

## 2017-12-28 NOTE — ED Notes (Signed)
Bed: WA20 Expected date:  Expected time:  Means of arrival:  Comments: EMS 85yr SOB

## 2017-12-29 DIAGNOSIS — R112 Nausea with vomiting, unspecified: Secondary | ICD-10-CM

## 2017-12-29 DIAGNOSIS — I10 Essential (primary) hypertension: Secondary | ICD-10-CM

## 2017-12-29 DIAGNOSIS — G20 Parkinson's disease: Secondary | ICD-10-CM

## 2017-12-29 DIAGNOSIS — R9431 Abnormal electrocardiogram [ECG] [EKG]: Secondary | ICD-10-CM | POA: Insufficient documentation

## 2017-12-29 LAB — BASIC METABOLIC PANEL
Anion gap: 12 (ref 5–15)
BUN: 16 mg/dL (ref 8–23)
CALCIUM: 9.2 mg/dL (ref 8.9–10.3)
CO2: 24 mmol/L (ref 22–32)
CREATININE: 1.08 mg/dL — AB (ref 0.44–1.00)
Chloride: 109 mmol/L (ref 98–111)
GFR calc Af Amer: 54 mL/min — ABNORMAL LOW (ref >60)
GFR calc non Af Amer: 47 mL/min — ABNORMAL LOW (ref >60)
GLUCOSE: 128 mg/dL — AB (ref 70–99)
Potassium: 3.7 mmol/L (ref 3.5–5.1)
Sodium: 145 mmol/L (ref 135–145)

## 2017-12-29 LAB — CBC
HCT: 31.7 % — ABNORMAL LOW (ref 36.0–46.0)
Hemoglobin: 9.6 g/dL — ABNORMAL LOW (ref 12.0–15.0)
MCH: 28.7 pg (ref 26.0–34.0)
MCHC: 30.3 g/dL (ref 30.0–36.0)
MCV: 94.9 fL (ref 80.0–100.0)
NRBC: 0 % (ref 0.0–0.2)
PLATELETS: 181 10*3/uL (ref 150–400)
RBC: 3.34 MIL/uL — ABNORMAL LOW (ref 3.87–5.11)
RDW: 14.7 % (ref 11.5–15.5)
WBC: 7.2 10*3/uL (ref 4.0–10.5)

## 2017-12-29 LAB — COMPREHENSIVE METABOLIC PANEL
ALK PHOS: 95 U/L (ref 38–126)
ALT: 13 U/L (ref 0–44)
AST: 14 U/L — ABNORMAL LOW (ref 15–41)
Albumin: 2.8 g/dL — ABNORMAL LOW (ref 3.5–5.0)
Anion gap: 10 (ref 5–15)
BUN: 32 mg/dL — AB (ref 8–23)
CALCIUM: 9 mg/dL (ref 8.9–10.3)
CO2: 29 mmol/L (ref 22–32)
CREATININE: 0.71 mg/dL (ref 0.44–1.00)
Chloride: 99 mmol/L (ref 98–111)
Glucose, Bld: 92 mg/dL (ref 70–99)
Potassium: 4.6 mmol/L (ref 3.5–5.1)
Sodium: 138 mmol/L (ref 135–145)
Total Bilirubin: 0.6 mg/dL (ref 0.3–1.2)
Total Protein: 6.4 g/dL — ABNORMAL LOW (ref 6.5–8.1)

## 2017-12-29 LAB — STREP PNEUMONIAE URINARY ANTIGEN: STREP PNEUMO URINARY ANTIGEN: NEGATIVE

## 2017-12-29 LAB — TROPONIN I: Troponin I: <0.03 ng/mL (ref <0.03)

## 2017-12-29 LAB — HIV ANTIBODY (ROUTINE TESTING W REFLEX): HIV Screen 4th Generation wRfx: NONREACTIVE

## 2017-12-29 MED ORDER — SODIUM CHLORIDE 0.9 % IV BOLUS
500.0000 mL | Freq: Once | INTRAVENOUS | Status: AC
  Administered 2017-12-29: 500 mL via INTRAVENOUS

## 2017-12-29 MED ORDER — SODIUM CHLORIDE 0.9 % IV SOLN
2.0000 g | INTRAVENOUS | Status: DC
  Administered 2017-12-29 – 2018-01-01 (×4): 2 g via INTRAVENOUS
  Filled 2017-12-29: qty 2
  Filled 2017-12-29: qty 20
  Filled 2017-12-29 (×3): qty 2

## 2017-12-29 MED ORDER — MAGNESIUM SULFATE 2 GM/50ML IV SOLN
2.0000 g | Freq: Once | INTRAVENOUS | Status: AC
  Administered 2017-12-29: 2 g via INTRAVENOUS
  Filled 2017-12-29: qty 50

## 2017-12-29 NOTE — Progress Notes (Signed)
Urine was tea colored and was 90 ml in the cannister of the dedicated suction device. The bladder scan yielded 0 ml. The PCP was notified.

## 2017-12-29 NOTE — Progress Notes (Signed)
[facility] PROGRESS NOTE    Progress Note  [name]  ZOX:[id number] DOB: April 12, 1933 DOA: 12/28/2017 PCP: Donato Schultz, DO     Brief Narrative:   [name] is an 84 y.o. female past medical history significant for Parkinson's, essential hypertension comes in with shortness of breath and malaise  Assessment/Plan:   CAP (community acquired pneumonia)/Nausea and vomiting: With new oxygen requirement chest x-ray showed right lower lobe infiltrate. There is a less than 5% cross-reactivity between penicillin and cephalosporin. We will start on IV Rocephin and azithromycin. Culture data is pending.  Essential hypertension Hold continue to hold antihypertensive medication.  Depression: Continue Prozac and Zyprexa.  Parkinson disease (HCC)/Symptomatic Parkinson disease (HCC): Continue Sinemet amantadine and prednisone.   DVT prophylaxis: lovenxo Family Communication:none Disposition Plan/Barrier to D/C: SNF in 2 days Code Status:     Code Status Orders  (From admission, onward)         Start     Ordered   12/28/17 1831  Full code  Continuous     12/28/17 1830        Code Status History    This patient has a current code status but no historical code status.    Advance Directive Documentation     Most Recent Value  Type of Advance Directive  Healthcare Power of Attorney  Pre-existing out of facility DNR order (yellow form or pink MOST form)  -  "MOST" Form in Place?  -        IV Access:    Peripheral IV   Procedures and diagnostic studies:   Dg Chest 2 View  Result Date: 12/28/2017 CLINICAL DATA:  Difficulty breathing EXAM: CHEST - 2 VIEW COMPARISON:  02/28/2017 FINDINGS: Cardiac shadow is mildly enlarged but accentuated by the frontal technique. The lungs are well aerated bilaterally. Right middle lobe infiltrate is seen. No sizable effusion is noted. No bony abnormality is seen. IMPRESSION: Mild right middle lobe infiltrate.  Electronically Signed   By: Alcide Clever M.D.   On: 12/28/2017 12:49     Medical Consultants:    None.  Anti-Infectives:   IV Rocephin and azithromycin  Subjective:    [name] she relates no cough with eating or drinking, she relates she feels better than yesterday.  Objective:    Vitals:   12/28/17 1829 12/28/17 2100 12/29/17 0631 12/29/17 0631  BP: (!) 159/71 124/65  129/80  Pulse: 89 91  (!) 102  Resp: 19 17  16   Temp: 99 F (37.2 C) 98.5 F (36.9 C)  98.2 F (36.8 C)  TempSrc: Oral Oral  Oral  SpO2: 100% 98%  98%  Weight:   114.6 kg   Height:        Intake/Output Summary (Last 24 hours) at 12/29/2017 0742 Last data filed at 12/29/2017 0630 Gross per 24 hour  Intake 1150 ml  Output 500 ml  Net 650 ml   Filed Weights   12/28/17 1133 12/29/17 0631  Weight: 115.2 kg 114.6 kg    Exam: General exam: In no acute distress. Respiratory system: Good air movement and clear to auscultation. Cardiovascular system: S1 & S2 heard, RRR. Gastrointestinal system: Abdomen is nondistended, soft and nontender.  Central nervous system: Alert and oriented. No focal neurological deficits. Extremities: No pedal edema. Skin: No rashes, lesions or ulcers Psychiatry: Judgement and insight appear normal. Mood & affect appropriate.    Data Reviewed:    Labs: Basic Metabolic Panel: Recent Labs  Lab  12/23/17 1701 12/28/17 1217 12/28/17 1945 12/29/17 0539  NA 142 144  --  138  K 4.5 3.9  --  4.6  CL 103 105  --  99  CO2 29 27  --  29  GLUCOSE 95 105*  --  92  BUN 17 16  --  32*  CREATININE 0.98 0.91  --  0.71  CALCIUM 9.6 9.2  --  9.0  MG  --   --  1.5*  --    GFR Estimated Creatinine Clearance: 69.7 mL/min (by C-G formula based on SCr of 0.71 mg/dL). Liver Function Tests: Recent Labs  Lab 12/23/17 1701 12/28/17 1217 12/29/17 0539  AST 3 6* 14*  ALT 1 5 13   ALKPHOS 105 106 95  BILITOT 0.6 0.8 0.6  PROT 7.0 7.2 6.4*  ALBUMIN 4.3 4.0 2.8*    Recent Labs  Lab 12/23/17 1701  LIPASE 15.0  AMYLASE 25*   No results for input(s): AMMONIA in the last 168 hours. Coagulation profile No results for input(s): INR, PROTIME in the last 168 hours.  CBC: Recent Labs  Lab 12/23/17 1701 12/28/17 1217 12/29/17 0539  WBC 5.3 5.8 7.2  NEUTROABS 3.0 3.6  --   HGB 12.5 11.9* 9.6*  HCT 39.2 37.2 31.7*  MCV 88.9 88.2 94.9  PLT 234.0 230 181   Cardiac Enzymes: Recent Labs  Lab 12/28/17 1217  TROPONINI <0.03   BNP (last 3 results) No results for input(s): PROBNP in the last 8760 hours. CBG: No results for input(s): GLUCAP in the last 168 hours. D-Dimer: No results for input(s): DDIMER in the last 72 hours. Hgb A1c: No results for input(s): HGBA1C in the last 72 hours. Lipid Profile: No results for input(s): CHOL, HDL, LDLCALC, TRIG, CHOLHDL, LDLDIRECT in the last 72 hours. Thyroid function studies: No results for input(s): TSH, T4TOTAL, T3FREE, THYROIDAB in the last 72 hours.  Invalid input(s): FREET3 Anemia work up: No results for input(s): VITAMINB12, FOLATE, FERRITIN, TIBC, IRON, RETICCTPCT in the last 72 hours. Sepsis Labs: Recent Labs  Lab 12/23/17 1701 12/28/17 1217 12/29/17 0539  WBC 5.3 5.8 7.2   Microbiology No results found for this or any previous visit (from the past 240 hour(s)).   Medications:   . amantadine  100 mg Oral QHS  . carbidopa-levodopa  4 tablet Oral QID  . enoxaparin (LOVENOX) injection  60 mg Subcutaneous Q24H  . famotidine  20 mg Oral BID  . FLUoxetine  40 mg Oral Daily  . OLANZapine  10 mg Oral Daily  . pantoprazole  40 mg Oral BID AC  . pravastatin  20 mg Oral Daily  . primidone  100 mg Oral QHS  . sodium chloride flush  3 mL Intravenous Q12H  . vitamin B-12  2,000 mcg Oral Daily   Continuous Infusions: . levofloxacin (LEVAQUIN) IV 750 mg (12/28/17 1750)     LOS: 1 day   Marinda Elk  [facility] Pager [phone]  *Please refer to [email or web address], password  [email or web address] to get updated schedule on who will round on this patient, as hospitalists switch teams weekly. If 7PM-7AM, please contact night-coverage at [email or web address], password [email or web address] for any overnight needs.  12/29/2017, 7:42 AM

## 2017-12-29 NOTE — Evaluation (Addendum)
Physical Therapy Evaluation Patient Details Name: [name] MRN: [id number] DOB: [birth year] Today's Date: 12/29/2017   History of Present Illness  84 y.o. female past medical history significant for Parkinson's, essential hypertension, chronic pain, lumbar spine surgery and admitted for CAP.  Clinical Impression  Pt admitted with above diagnosis. Pt currently with functional limitations due to the deficits listed below (see PT Problem List).  Pt will benefit from skilled PT to increase their independence and safety with mobility to allow discharge to the venue listed below.   Pt requiring min assist for mobility and currently requiring supplemental oxygen.  Pt reports her daughter works (also supposed to have surgery tomorrow) and she will not have assist if d/c home.  Currently recommend SNF at this time.  SATURATION QUALIFICATIONS: (This note is used to comply with regulatory documentation for home oxygen)  Patient Saturations on Room Air at Rest = 90%  Patient Saturations on Room Air while Ambulating = 85%  Patient Saturations on 2 Liters of oxygen while Ambulating = 93-94%  Please briefly explain why patient needs home oxygen: to improve oxygen saturations during physical exertion such as ambulation     Follow Up Recommendations SNF    Equipment Recommendations  None recommended by PT    Recommendations for Other Services       Precautions / Restrictions Precautions Precautions: Fall Precaution Comments: monitor sats      Mobility  Bed Mobility Overal bed mobility: Needs Assistance Bed Mobility: Supine to Sit     Supine to sit: Min assist;HOB elevated     General bed mobility comments: increased time and effort; slight assist for trunk  Transfers Overall transfer level: Needs assistance Equipment used: Rolling walker (2 wheeled) Transfers: Sit to/from Stand Sit to Stand: Min assist         General transfer comment: verbal cues for hand placement;  assist to rise and steady  Ambulation/Gait Ambulation/Gait assistance: Min assist Gait Distance (Feet): 20 Feet Assistive device: Rolling walker (2 wheeled) Gait Pattern/deviations: Step-through pattern;Decreased stride length;Wide base of support     General Gait Details: verbal cues for RW positioning, distance to tolerance, ambulated on 2L O2 [place] and SPO2 94%  Stairs            Wheelchair Mobility    Modified Rankin (Stroke Patients Only)       Balance Overall balance assessment: Needs assistance         Standing balance support: Bilateral upper extremity supported Standing balance-Leahy Scale: Poor Standing balance comment: requires UE support                             Pertinent Vitals/Pain Pain Assessment: No/denies pain    Home Living Family/patient expects to be discharged to:: Private residence Living Arrangements: Children(daughter)   Type of Home: House       Home Layout: One level Home Equipment: Environmental consultant - 2 wheels;Walker - 4 wheels      Prior Function Level of Independence: Independent with assistive device(s)               Hand Dominance        Extremity/Trunk Assessment        Lower Extremity Assessment Lower Extremity Assessment: Generalized weakness       Communication   Communication: No difficulties  Cognition Arousal/Alertness: Awake/alert Behavior During Therapy: WFL for tasks assessed/performed;Flat affect Overall Cognitive Status: Within Functional Limits for tasks assessed  General Comments      Exercises     Assessment/Plan    PT Assessment Patient needs continued PT services  PT Problem List Decreased strength;Decreased mobility;Decreased activity tolerance;Decreased knowledge of use of DME;Cardiopulmonary status limiting activity;Decreased balance       PT Treatment Interventions Therapeutic exercise;Gait training;DME  instruction;Therapeutic activities;Functional mobility training;Patient/family education;Balance training    PT Goals (Current goals can be found in the Care Plan section)  Acute Rehab PT Goals PT Goal Formulation: With patient Time For Goal Achievement: 01/12/18 Potential to Achieve Goals: Good    Frequency Min 3X/week   Barriers to discharge        Co-evaluation               AM-PAC PT "6 Clicks" Daily Activity  Outcome Measure Difficulty turning over in bed (including adjusting bedclothes, sheets and blankets)?: A Lot Difficulty moving from lying on back to sitting on the side of the bed? : Unable Difficulty sitting down on and standing up from a chair with arms (e.g., wheelchair, bedside commode, etc,.)?: Unable Help needed moving to and from a bed to chair (including a wheelchair)?: A Little Help needed walking in hospital room?: A Little Help needed climbing 3-5 steps with a railing? : A Lot 6 Click Score: 12    End of Session Equipment Utilized During Treatment: Gait belt;Oxygen Activity Tolerance: Patient limited by fatigue Patient left: in chair;with chair alarm set;with call bell/phone within reach;with nursing/sitter in room Nurse Communication: Mobility status(NT pushed IV pole and O2 during ambulation) PT Visit Diagnosis: Other abnormalities of gait and mobility (R26.89)    Time: 1610-9604 PT Time Calculation (min) (ACUTE ONLY): 20 min   Charges:   PT Evaluation $PT Eval Low Complexity: 1 Low         Zenovia Jarred, PT, DPT Acute Rehabilitation Services Office: [phone] Pager: [phone]  Sarajane Jews 12/29/2017, 12:29 PM

## 2017-12-29 NOTE — ED Provider Notes (Signed)
12:10 PM-CODE BLUE called to room 1403.  I responded with equipment for intubation and resuscitation.  Patient lying on floor beside commode, lethargic, pale, minimally responsive.  Patient was able to speak and open eyes on command.  Patient began to vomit.  She was rolled to her side, and did not appear to choke or have respiratory difficulty.  Patient's pulse faintly palpated by nursing staff.  Patient was in an awkward position and could not be immediately removed from the bathroom, or off the floor because of her position and weight.  Patient was maintained in this position on the floor, until staff could arrive to place her into her hospital bed.  Oxygen saturation checked on floor greater than 90%.  When she was placed in the bed, heart rate was 46.  Initial blood pressure 155 systolic.  CBG ordered and checked and was normal.  Patient placed on cardiac monitor with sinus tachycardia.  Patient did not require respiratory assistance or cardiac compressions.  Patient had several more episodes of vomiting, brown material which appeared to be stomach contents.  She does not appear to gag or choke.  Oxygen saturation remained normal.  Assessment-vasovagal episode with hypotension, bradycardia.  Vomiting secondary to body position and syncope.  Doubt aspiration, cardiac arrest, metabolic instability.  Plan-care transferred to hospital team in the room, Dr. Robb Matar.     Mancel Bale, MD 12/29/17 [phone]

## 2017-12-29 NOTE — Progress Notes (Signed)
Family called and made aware of episode.

## 2017-12-29 NOTE — Progress Notes (Signed)
Patient's vitals: [name];HR106;RR20;135/101;97% [place] on 2 L /min;MAP111. PCP on call was notified.

## 2017-12-29 NOTE — Progress Notes (Signed)
NT assisted patient to BR. NT called out for assist. Entering room pt sitting on toilet, unresponsive, color grey, faint pulse, resp irregular and shallow. Code blue initiated. Patient lifted to floor, pads applied. Code team arrived, pt more responsive but still lethargic. Pt lifted back to bed. MD at bedside. Orders initiated.  Dravin Lance A

## 2017-12-30 ENCOUNTER — Inpatient Hospital Stay (HOSPITAL_COMMUNITY)
Admit: 2017-12-30 | Discharge: 2017-12-30 | Disposition: A | Discharge status: Home / Self Care | Payer: Medicare Other | Attending: Internal Medicine | Admitting: Internal Medicine

## 2017-12-30 ENCOUNTER — Encounter (HOSPITAL_COMMUNITY): Payer: Self-pay | Admitting: Radiology

## 2017-12-30 ENCOUNTER — Inpatient Hospital Stay (HOSPITAL_COMMUNITY): Payer: Medicare Other

## 2017-12-30 DIAGNOSIS — N179 Acute kidney failure, unspecified: Secondary | ICD-10-CM

## 2017-12-30 DIAGNOSIS — R55 Syncope and collapse: Secondary | ICD-10-CM

## 2017-12-30 LAB — CBC
HCT: 37.3 % (ref 36.0–46.0)
HEMOGLOBIN: 11.3 g/dL — AB (ref 12.0–15.0)
MCH: 28.3 pg (ref 26.0–34.0)
MCHC: 30.3 g/dL (ref 30.0–36.0)
MCV: 93.5 fL (ref 80.0–100.0)
Platelets: 194 10*3/uL (ref 150–400)
RBC: 3.99 MIL/uL (ref 3.87–5.11)
RDW: 17.2 % — ABNORMAL HIGH (ref 11.5–15.5)
WBC: 7 10*3/uL (ref 4.0–10.5)
nRBC: 0 % (ref 0.0–0.2)

## 2017-12-30 LAB — CK: Total CK: 34 U/L — ABNORMAL LOW (ref 38–234)

## 2017-12-30 LAB — PROLACTIN: PROLACTIN: 110.5 ng/mL — AB (ref 4.8–23.3)

## 2017-12-30 LAB — MAGNESIUM: MAGNESIUM: 2.1 mg/dL (ref 1.7–2.4)

## 2017-12-30 MED ORDER — SODIUM CHLORIDE 0.9 % IV BOLUS: 500.0000 mL | Freq: Once | INTRAVENOUS | Status: AC

## 2017-12-30 MED ORDER — HYDRALAZINE HCL 20 MG/ML IJ SOLN
10.0000 mg | Freq: Once | INTRAMUSCULAR | Status: AC
  Administered 2017-12-30: 10 mg via INTRAVENOUS
  Filled 2017-12-30: qty 1

## 2017-12-30 MED ORDER — SODIUM CHLORIDE 0.9 % IV BOLUS
500.0000 mL | Freq: Once | INTRAVENOUS | Status: AC
  Administered 2017-12-30: 500 mL via INTRAVENOUS

## 2017-12-30 NOTE — Procedures (Signed)
ELECTROENCEPHALOGRAM REPORT   Patient: [name]       Room #: 1610  Age: 84 y.o.        Sex: female Referring Physician: David Stall Report Date:  12/30/2017        Interpreting Physician: Thana Farr  History: [name] is an 84 y.o. female with altered mental status  Medications:  Symmetrel, Sinemet, Rocephin, Pepcid, Prozac, Zyprexa, Protonix, Pravachol, Mysoline, B12  Conditions of Recording:  This is a 21 channel routine scalp EEG performed with bipolar and monopolar montages arranged in accordance to the international 10/20 system of electrode placement. One channel was dedicated to EKG recording.  The patient is in the awake and briefly drowsy state.  Description:  The waking background activity consists of a low voltage, symmetrical, fairly well organized, 7 Hz theta activity, seen from the parieto-occipital and posterior temporal regions.  Low voltage fast activity, poorly organized, is seen anteriorly and is at times superimposed on more posterior regions.  A mixture of theta and alpha rhythms are seen from the central and temporal regions. The patient drowses briefly with slowing to irregular, low voltage theta and beta activity.   Stage II sleep is not obtained. Hyperventilation and intermittent photic stimulation were not performed.   IMPRESSION: This is an abnormal EEG secondary to posterior background slowing.  This finding may be seen with a diffuse gray matter disturbance that is etiologically nonspecific, but may include a dementia, among other possibilities.  No epileptiform activity is noted.     Thana Farr, MD Neurology [phone] 12/30/2017, 7:20 PM

## 2017-12-30 NOTE — Progress Notes (Signed)
Attempted to administer the NS bolus in the Right Upper Arm PIV but the patient could not tolerate the pressure of the Bolus Rate. Attempted to restart another  PIV but was unsuccessful.  IV team was notified.

## 2017-12-30 NOTE — Progress Notes (Addendum)
[facility] PROGRESS NOTE    Progress Note  [name]  ZOX:[id number] DOB: 07/01/1933 DOA: 12/28/2017 PCP: Donato Schultz, DO     Brief Narrative:   [name] is an 84 y.o. female past medical history significant for Parkinson's, essential hypertension comes in with shortness of breath and malaise  Assessment/Plan:   CAP (community acquired pneumonia)/Nausea and vomiting: We will try to wean off oxygen, continue IV Rocephin and azithro. Culture data is negative till date Physical therapy recommended skilled nursing facility.  Essential hypertension Hold continue to hold antihypertensive medication.  Depression: Continue Prozac and Zyprexa.  Parkinson disease (HCC)/Symptomatic Parkinson disease (HCC): Continue Sinemet amantadine and prednisone.  Syncope: After a large bowel movement question vasovagal, labs after event showed negative troponins, mild rise in creatinine, magnesium is 2.1 potassium is 3.4, her prolactins was 110,, will check a CT scan of the head and the EEG rule out seizures. She has had no events on telemetry.  Mild acute kidney injury: In the setting of syncopal episode, check a CK hydrate check a basic metabolic panel in the morning.  DVT prophylaxis: lovenxo Family Communication:none Disposition Plan/Barrier to D/C: SNF in 2 days Code Status:     Code Status Orders  (From admission, onward)         Start     Ordered   12/28/17 1831  Full code  Continuous     12/28/17 1830        Code Status History    This patient has a current code status but no historical code status.    Advance Directive Documentation     Most Recent Value  Type of Advance Directive  Healthcare Power of Attorney  Pre-existing out of facility DNR order (yellow form or pink MOST form)  -  "MOST" Form in Place?  -        IV Access:    Peripheral IV   Procedures and diagnostic studies:   Dg Chest 2 View  Result Date:  12/28/2017 CLINICAL DATA:  Difficulty breathing EXAM: CHEST - 2 VIEW COMPARISON:  02/28/2017 FINDINGS: Cardiac shadow is mildly enlarged but accentuated by the frontal technique. The lungs are well aerated bilaterally. Right middle lobe infiltrate is seen. No sizable effusion is noted. No bony abnormality is seen. IMPRESSION: Mild right middle lobe infiltrate. Electronically Signed   By: Alcide Clever M.D.   On: 12/28/2017 12:49     Medical Consultants:    None.  Anti-Infectives:   IV Rocephin and azithromycin  Subjective:    [name] feels better than yesterday no events overnight..  Objective:    Vitals:   12/29/17 1405 12/29/17 2031 12/30/17 0409 12/30/17 0418  BP: 116/73 (!) 135/101 (!) 147/73   Pulse: 91 (!) 106 62   Resp: 18 20 18    Temp: 98 F (36.7 C) 98 F (36.7 C) 98.1 F (36.7 C)   TempSrc: Oral Oral Oral   SpO2: 95% 97% 96%   Weight:    115.4 kg  Height:        Intake/Output Summary (Last 24 hours) at 12/30/2017 0905 Last data filed at 12/30/2017 0647 Gross per 24 hour  Intake 989.56 ml  Output 210 ml  Net 779.56 ml   Filed Weights   12/28/17 1133 12/29/17 0631 12/30/17 0418  Weight: 115.2 kg 114.6 kg 115.4 kg    Exam: General exam: In no acute distress. Respiratory system: Good air movement and clear to auscultation. Cardiovascular system: S1 &  S2 heard, RRR. Gastrointestinal system: Abdomen is nondistended, soft and nontender.  Central nervous system: Alert and oriented. No focal neurological deficits. Extremities: No pedal edema. Skin: No rashes, lesions or ulcers Psychiatry: Judgement and insight appear normal. Mood & affect appropriate.    Data Reviewed:    Labs: Basic Metabolic Panel: Recent Labs  Lab 12/23/17 1701 12/28/17 1217 12/28/17 1945 12/29/17 0539 12/29/17 1133 12/30/17 0604  NA 142 144  --  138 145  --   K 4.5 3.9  --  4.6 3.7  --   CL 103 105  --  99 109  --   CO2 29 27  --  29 24  --   GLUCOSE 95 105*  --   92 128*  --   BUN 17 16  --  32* 16  --   CREATININE 0.98 0.91  --  0.71 1.08*  --   CALCIUM 9.6 9.2  --  9.0 9.2  --   MG  --   --  1.5*  --   --  2.1   GFR Estimated Creatinine Clearance: 51.9 mL/min (A) (by C-G formula based on SCr of 1.08 mg/dL (H)). Liver Function Tests: Recent Labs  Lab 12/23/17 1701 12/28/17 1217 12/29/17 0539  AST 3 6* 14*  ALT 1 5 13   ALKPHOS 105 106 95  BILITOT 0.6 0.8 0.6  PROT 7.0 7.2 6.4*  ALBUMIN 4.3 4.0 2.8*   Recent Labs  Lab 12/23/17 1701  LIPASE 15.0  AMYLASE 25*   No results for input(s): AMMONIA in the last 168 hours. Coagulation profile No results for input(s): INR, PROTIME in the last 168 hours.  CBC: Recent Labs  Lab 12/23/17 1701 12/28/17 1217 12/29/17 0539 12/30/17 0604  WBC 5.3 5.8 7.2 7.0  NEUTROABS 3.0 3.6  --   --   HGB 12.5 11.9* 9.6* 11.3*  HCT 39.2 37.2 31.7* 37.3  MCV 88.9 88.2 94.9 93.5  PLT 234.0 230 181 194   Cardiac Enzymes: Recent Labs  Lab 12/28/17 1217 12/29/17 1133  TROPONINI <0.03 <0.03   BNP (last 3 results) No results for input(s): PROBNP in the last 8760 hours. CBG: No results for input(s): GLUCAP in the last 168 hours. D-Dimer: No results for input(s): DDIMER in the last 72 hours. Hgb A1c: No results for input(s): HGBA1C in the last 72 hours. Lipid Profile: No results for input(s): CHOL, HDL, LDLCALC, TRIG, CHOLHDL, LDLDIRECT in the last 72 hours. Thyroid function studies: No results for input(s): TSH, T4TOTAL, T3FREE, THYROIDAB in the last 72 hours.  Invalid input(s): FREET3 Anemia work up: No results for input(s): VITAMINB12, FOLATE, FERRITIN, TIBC, IRON, RETICCTPCT in the last 72 hours. Sepsis Labs: Recent Labs  Lab 12/23/17 1701 12/28/17 1217 12/29/17 0539 12/30/17 0604  WBC 5.3 5.8 7.2 7.0   Microbiology No results found for this or any previous visit (from the past 240 hour(s)).   Medications:   . amantadine  100 mg Oral QHS  . carbidopa-levodopa  4 tablet Oral QID   . enoxaparin (LOVENOX) injection  60 mg Subcutaneous Q24H  . famotidine  20 mg Oral BID  . FLUoxetine  40 mg Oral Daily  . OLANZapine  10 mg Oral Daily  . pantoprazole  40 mg Oral BID AC  . pravastatin  20 mg Oral Daily  . primidone  100 mg Oral QHS  . sodium chloride flush  3 mL Intravenous Q12H  . vitamin B-12  2,000 mcg Oral Daily   Continuous Infusions: .  cefTRIAXone (ROCEPHIN)  IV 2 g (12/29/17 1033)     LOS: 2 days   Marinda Elk  [facility] Pager [phone]  *Please refer to [email or web address], password [email or web address] to get updated schedule on who will round on this patient, as hospitalists switch teams weekly. If 7PM-7AM, please contact night-coverage at [email or web address], password [email or web address] for any overnight needs.  12/30/2017, 9:05 AM

## 2017-12-30 NOTE — Progress Notes (Signed)
EEG complete - results pending 

## 2017-12-30 NOTE — Progress Notes (Signed)
Physical Therapy Treatment Patient Details Name: [name] MRN: [id number] DOB: 06/08/1933 Today's Date: 12/30/2017    History of Present Illness 84 y.o. female past medical history significant for Parkinson's, essential hypertension, chronic pain, lumbar spine surgery and admitted for CAP.    PT Comments    Pt assisted with ambulating however only tolerated short distance due to dizziness.  SPO2 93% on room air during ambulation.  Pt assisted to recliner end of session (see below for mobility and vitals).  Continue to recommend SNF upon d/c at this time however one of pt's daughters present in room today (observed session) and would like for pt to return home with possibly increased care.  Pt appears agreeable to either SNF or home.  Pt would best benefit from SNF for rehab.  Pt does present as a high fall risk.   Follow Up Recommendations  SNF;Supervision/Assistance - 24 hour     Equipment Recommendations  None recommended by PT    Recommendations for Other Services       Precautions / Restrictions Precautions Precautions: Fall Precaution Comments: monitor sats    Mobility  Bed Mobility Overal bed mobility: Needs Assistance Bed Mobility: Supine to Sit     Supine to sit: Min assist;HOB elevated     General bed mobility comments: increased time and effort; slight assist for trunk  Transfers Overall transfer level: Needs assistance Equipment used: Rolling walker (2 wheeled) Transfers: Sit to/from Stand Sit to Stand: Min assist         General transfer comment: verbal cues for hand placement; assist to rise and steady  Ambulation/Gait Ambulation/Gait assistance: Min assist;+2 safety/equipment Gait Distance (Feet): 20 Feet Assistive device: Rolling walker (2 wheeled) Gait Pattern/deviations: Step-through pattern;Decreased stride length;Wide base of support     General Gait Details: verbal cues for RW positioning and remaining inside RW especially with  turns, distance limited due to fatigue and pt reports moderate dizziness which did not resolve, SpO2 93% on room air during ambulation (RN notified and pt left off O2 [place]), BP 118/66 mmHg however quickly disappeared and automatically reinflated with reading 155/82 mmHg and HR 78 bpm; dizziness resolved with sitting in recliner   Stairs             Wheelchair Mobility    Modified Rankin (Stroke Patients Only)       Balance Overall balance assessment: Needs assistance         Standing balance support: Bilateral upper extremity supported Standing balance-Leahy Scale: Poor Standing balance comment: requires UE support                            Cognition Arousal/Alertness: Awake/alert Behavior During Therapy: WFL for tasks assessed/performed Overall Cognitive Status: Within Functional Limits for tasks assessed                                        Exercises      General Comments        Pertinent Vitals/Pain Pain Assessment: No/denies pain    Home Living                      Prior Function            PT Goals (current goals can now be found in the care plan section) Progress towards PT goals: Progressing toward  goals    Frequency    Min 3X/week      PT Plan Current plan remains appropriate    Co-evaluation              AM-PAC PT "6 Clicks" Daily Activity  Outcome Measure  Difficulty turning over in bed (including adjusting bedclothes, sheets and blankets)?: A Lot Difficulty moving from lying on back to sitting on the side of the bed? : Unable Difficulty sitting down on and standing up from a chair with arms (e.g., wheelchair, bedside commode, etc,.)?: Unable Help needed moving to and from a bed to chair (including a wheelchair)?: A Little Help needed walking in hospital room?: A Little Help needed climbing 3-5 steps with a railing? : A Lot 6 Click Score: 12    End of Session Equipment Utilized During  Treatment: Gait belt;Oxygen Activity Tolerance: Patient limited by fatigue Patient left: in chair;with chair alarm set;with call bell/phone within reach;with family/visitor present Nurse Communication: Mobility status PT Visit Diagnosis: Other abnormalities of gait and mobility (R26.89)     Time: 1610-9604 PT Time Calculation (min) (ACUTE ONLY): 24 min  Charges:  $Gait Training: 23-37 mins                    Zenovia Jarred, PT, DPT Acute Rehabilitation Services Office: [phone] Pager: [phone]  Sarajane Jews 12/30/2017, 12:53 PM

## 2017-12-30 NOTE — Progress Notes (Signed)
Urine output for past 10 hours was 120 ml.  PCP was notified

## 2017-12-31 ENCOUNTER — Other Ambulatory Visit: Payer: Self-pay | Admitting: *Deleted

## 2017-12-31 DIAGNOSIS — R112 Nausea with vomiting, unspecified: Secondary | ICD-10-CM

## 2017-12-31 LAB — BASIC METABOLIC PANEL
Anion gap: 9 (ref 5–15)
BUN: 10 mg/dL (ref 8–23)
CALCIUM: 8.6 mg/dL — AB (ref 8.9–10.3)
CO2: 25 mmol/L (ref 22–32)
CREATININE: 0.78 mg/dL (ref 0.44–1.00)
Chloride: 108 mmol/L (ref 98–111)
GFR calc Af Amer: >60 mL/min (ref >60)
Glucose, Bld: 100 mg/dL — ABNORMAL HIGH (ref 70–99)
Potassium: 3.5 mmol/L (ref 3.5–5.1)
Sodium: 142 mmol/L (ref 135–145)

## 2017-12-31 LAB — GLUCOSE, CAPILLARY: Glucose-Capillary: 129 mg/dL — ABNORMAL HIGH (ref 70–99)

## 2017-12-31 MED ORDER — CYANOCOBALAMIN 1000 MCG/ML IJ SOLN: INTRAMUSCULAR | 0 refills | Status: DC

## 2017-12-31 MED ORDER — "SYRINGE/NEEDLE (DISP) 25G X 5/8"" 3 ML MISC": 1 refills | Status: DC

## 2017-12-31 MED ORDER — CYANOCOBALAMIN 1000 MCG/ML IJ SOLN: INTRAMUSCULAR | 1 refills | Status: DC

## 2017-12-31 MED ORDER — VITAMIN D (ERGOCALCIFEROL) 1.25 MG (50000 UNIT) PO CAPS: 50000.0000 [IU] | ORAL_CAPSULE | ORAL | 2 refills | Status: AC

## 2017-12-31 MED ORDER — VITAMIN D-3 25 MCG (1000 UT) PO CAPS: 1.0000 | ORAL_CAPSULE | Freq: Every day | ORAL | 5 refills | Status: AC

## 2017-12-31 MED ORDER — AZITHROMYCIN 250 MG PO TABS
500.0000 mg | ORAL_TABLET | Freq: Every day | ORAL | Status: DC
  Administered 2017-12-31 – 2018-01-02 (×3): 500 mg via ORAL
  Filled 2017-12-31 (×3): qty 2

## 2017-12-31 MED ORDER — AMLODIPINE BESYLATE 10 MG PO TABS
10.0000 mg | ORAL_TABLET | Freq: Every day | ORAL | Status: DC
  Administered 2017-12-31 – 2018-01-02 (×3): 10 mg via ORAL
  Filled 2017-12-31 (×3): qty 1

## 2017-12-31 MED ORDER — IRBESARTAN 300 MG PO TABS
300.0000 mg | ORAL_TABLET | Freq: Every day | ORAL | Status: DC
  Administered 2017-12-31 – 2018-01-02 (×3): 300 mg via ORAL
  Filled 2017-12-31 (×3): qty 1

## 2017-12-31 NOTE — Clinical Social Work Placement (Signed)
CSW attempted to call patient's daughter [name]) to provide bed offers, no answer nor option to leave voicemail. CSW provided patient with bed offers and explained that patient needed to make a SNF selection os insurance authorization could be initiated. Patient agreed to make a SNF selection after speaking with daughter.  CLINICAL SOCIAL WORK PLACEMENT  NOTE  Date:  12/31/2017  Patient Details  Name: [name] MRN: [id number] Date of Birth: Jun 28, 1933  Clinical Social Work is seeking post-discharge placement for this patient at the Skilled  Nursing Facility level of care (*CSW will initial, date and re-position this form in  chart as items are completed):  Yes   Patient/family provided with [facility] Clinical Social Work Department's list of facilities offering this level of care within the geographic area requested by the patient (or if unable, by the patient's family).  Yes   Patient/family informed of their freedom to choose among providers that offer the needed level of care, that participate in Medicare, Medicaid or managed care program needed by the patient, have an available bed and are willing to accept the patient.  Yes   Patient/family informed of [facility]'s ownership interest in [facility] and [facility], as well as of the fact that they are under no obligation to receive care at these facilities.  PASRR submitted to EDS on       PASRR number received on       Existing PASRR number confirmed on 12/31/17     FL2 transmitted to all facilities in geographic area requested by pt/family on 12/31/17     FL2 transmitted to all facilities within larger geographic area on       Patient informed that his/her managed care company has contracts with or will negotiate with certain facilities, including the following:        Yes   Patient/family informed of bed offers received.  Patient chooses bed at       Physician recommends and patient chooses bed at       Patient to be transferred to   on  .  Patient to be transferred to facility by       Patient family notified on   of transfer.  Name of family member notified:        PHYSICIAN       Additional Comment:    _______________________________________________ Antionette Poles, LCSW 12/31/2017, 3:48 PM

## 2017-12-31 NOTE — Clinical Social Work Note (Signed)
Clinical Social Work Assessment  Patient Details  Name: [name] MRN: [id number] Date of Birth: 1933/03/25  Date of referral:  12/31/17               Reason for consult:  Facility Placement                Permission sought to share information with:  Facility Medical sales representative, Family Supports Permission granted to share information::  Yes, Verbal Permission Granted  Name::     Aava Deland  Agency::     Relationship::  Daughter  Contact Information:  [phone]  Housing/Transportation Living arrangements for the past 2 months:  Single Family Home Source of Information:  Patient Patient Interpreter Needed:  None Criminal Activity/Legal Involvement Pertinent to Current Situation/Hospitalization:  No - Comment as needed Significant Relationships:  Adult Children Lives with:  Adult Children(daughter - JoAnna) Do you feel safe going back to the place where you live?  (PT recommending SNF) Need for family participation in patient care:  No (Coment)  Care giving concerns:  Patient from home with daughter. Patient reported that she needs assistance with ADLs and that she has an aide that comes 2x/week. Patient reported that she uses a walker at baseline. Patient has a "past medical history significant for Parkinson's, essential hypertension, chronic pain, lumbar spine surgery and admitted for CAP."PT recommending SNF;Supervision/Assistance 24 hour.   Social Worker assessment / plan:  CSW spoke with patient at bedside regarding PT recommendation for SNF and discharge planning. Patient reported that she has been to SNF in the past and feels like she doesn't have another choice. Patient reported that her daughter that resides with her recently had surgery and cannot assist patient in the home. CSW agreed to complete patient's FL2 and follow up with bed offers. Patient granted CSW verbal permission to speak with her daughter regarding discharge planning.  CSW complete patient's FL2  and will follow up with bed offers.  CSW will continue to follow and assist with discharge planning.  Employment status:  Retired Database administrator PT Recommendations:  Skilled Nursing Facility Information / Referral to community resources:  Skilled Nursing Facility  Patient/Family's Response to care:  Patient appreciative of CSW assistance with discharge planning.   Patient/Family's Understanding of and Emotional Response to Diagnosis, Current Treatment, and Prognosis:  Patient presented calm and verbalized understanding of PT recommendation for SNF. Patient reported that she prefers to return home but does not have the support that is needed at this time. Patient verbalized plan to discharge to SNF for ST rehab when medically stable.  Emotional Assessment Appearance:  Appears stated age Attitude/Demeanor/Rapport:  Other(Cooperative) Affect (typically observed):  Appropriate, Calm Orientation:  Oriented to Self, Oriented to Situation, Oriented to Place, Oriented to  Time Alcohol / Substance use:  Not Applicable Psych involvement (Current and /or in the community):  No (Comment)  Discharge Needs  Concerns to be addressed:  Care Coordination Readmission within the last 30 days:  No Current discharge risk:  Physical Impairment, Lack of support system Barriers to Discharge:  Continued Medical Work up, [employer], LCSW 12/31/2017, 12:39 PM

## 2017-12-31 NOTE — NC FL2 (Signed)
[place] MEDICAID FL2 LEVEL OF CARE SCREENING TOOL     IDENTIFICATION  Patient Name: [name] Birthdate: 1933/08/19 Sex: female Admission Date (Current Location): 12/28/2017  [facility] and [place] Number:  Producer, television/film/video and Address:  [facility],  [address], [place] [address]      Provider Number: [id number]  Attending Physician Name and Address:  Calvert Cantor, MD  Relative Name and Phone Number:  Emillie Chasen (daughter) [phone]    Current Level of Care: Hospital Recommended Level of Care: Skilled Nursing Facility Prior Approval Number:    Date Approved/Denied:   PASRR Number: [id number]  Discharge Plan: SNF    Current Diagnoses: Patient Active Problem List   Diagnosis Date Noted  . Syncope and collapse 12/30/2017  . Nausea and vomiting 12/29/2017  . Prolonged QT interval 12/29/2017  . CAP (community acquired pneumonia) 12/28/2017  . Dyspepsia 09/25/2017  . 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 Toe pain, right 11/17/2016  . Left shoulder pain 01/19/2016  . Hyperlipidemia 05/03/2014  . Disc disorder of lumbar region 12/30/2013  . Obesity (BMI 30-39.9) 07/01/2013  . Parkinson disease (HCC) 09/17/2012  . Biliary dyskinesia 12/10/2011  . UTI (urinary tract infection) 07/24/2011  . Lumbar back pain 06/12/2011  . Symptomatic Parkinson disease (HCC) 02/26/2011  . Depression 09/28/2010  . Pre-operative cardiovascular examination 07/23/2010  . CAROTID BRUIT, RIGHT 06/29/2009  . CHEST PAIN-UNSPECIFIED 01/13/2009  . SKIN RASH 10/11/2008  . EDEMA 08/25/2008  . OTHER URINARY INCONTINENCE 02/15/2008  . CHRONIC PAIN SYNDROME 12/31/2007  . BRUISE 09/14/2007  . SCOLIOSIS, LUMBAR SPINE 06/15/2007  . BACK PAIN, THORACIC REGION, RIGHT 06/11/2007  . MENISCUS TEAR 08-Feb-202008  . OTHER SYMPTOMS REFERABLE TO LOWER LEG JOINT 02/17/2007  . MYALGIA 02/10/2007  . CALF PAIN, RIGHT 02/10/2007  . DEPRESSION 01/01/2007  . HYPOKALEMIA, HX OF  01/01/2007  . TINEA CORPORIS 09/22/2006  . ALLERGIC DRUG REACTION, OPIOIDS 09/22/2006  . Essential hypertension 08/20/2006  . OSTEOARTHRITIS 08/20/2006    Orientation RESPIRATION BLADDER Height & Weight     Self, Time, Situation, Place  Normal Incontinent Weight: 256 lb 8 oz (116.3 kg) Height:  5\' 5"  (165.1 cm)  BEHAVIORAL SYMPTOMS/MOOD NEUROLOGICAL BOWEL NUTRITION STATUS        Diet(regular)  AMBULATORY STATUS COMMUNICATION OF NEEDS Skin   Limited Assist Verbally Normal                       Personal Care Assistance Level of Assistance  Bathing, Feeding, Dressing Bathing Assistance: Maximum assistance Feeding assistance: Independent Dressing Assistance: Maximum assistance     Functional Limitations Info  Sight, Hearing, Speech Sight Info: Adequate Hearing Info: Impaired Speech Info: Adequate    SPECIAL CARE FACTORS FREQUENCY  PT (By licensed PT), OT (By licensed OT)     PT Frequency: 5x/week OT Frequency: 5x/week            Contractures Contractures Info: Not present    Additional Factors Info  Allergies, Code Status Code Status Info: Full Code Allergies Info: Codeine;Latex;Morphine Sulfate;Sertraline Hcl;Penicillins;Gabapentin           Current Medications (12/31/2017):  This is the current hospital active medication list Current Facility-Administered Medications  Medication Dose Route Frequency Provider Last Rate Last Dose  . amantadine (SYMMETREL) capsule 100 mg  100 mg Oral QHS Zigmund Daniel., MD   100 mg at 12/30/17 2108  . amLODipine (NORVASC) tablet 10 mg  10 mg Oral Daily Calvert Cantor, MD  10 mg at 12/31/17 1012   And  . irbesartan (AVAPRO) tablet 300 mg  300 mg Oral Daily Calvert Cantor, MD   300 mg at 12/31/17 1012  . azithromycin (ZITHROMAX) tablet 500 mg  500 mg Oral Daily Calvert Cantor, MD   500 mg at 12/31/17 1012  . carbidopa-levodopa (SINEMET IR) 25-100 MG per tablet immediate release 4 tablet  4 tablet Oral QID Zigmund Daniel., MD   4 tablet at 12/31/17 1012  . cefTRIAXone (ROCEPHIN) 2 g in sodium chloride 0.9 % 100 mL IVPB  2 g Intravenous Q24H Marinda Elk, MD 200 mL/hr at 12/31/17 1016 2 g at 12/31/17 1016  . enoxaparin (LOVENOX) injection 60 mg  60 mg Subcutaneous Q24H Zigmund Daniel., MD   60 mg at 12/30/17 2108  . famotidine (PEPCID) tablet 20 mg  20 mg Oral BID Zigmund Daniel., MD   20 mg at 12/31/17 1012  . FLUoxetine (PROZAC) capsule 40 mg  40 mg Oral Daily Zigmund Daniel., MD   40 mg at 12/31/17 1012  . OLANZapine (ZYPREXA) tablet 10 mg  10 mg Oral Daily Zigmund Daniel., MD   10 mg at 12/31/17 1012  . ondansetron (ZOFRAN) injection 4 mg  4 mg Intravenous Q6H PRN Zigmund Daniel., MD      . pantoprazole (PROTONIX) EC tablet 40 mg  40 mg Oral BID AC Zigmund Daniel., MD   40 mg at 12/31/17 1011  . pravastatin (PRAVACHOL) tablet 20 mg  20 mg Oral Daily Zigmund Daniel., MD   20 mg at 12/31/17 1012  . primidone (MYSOLINE) tablet 100 mg  100 mg Oral QHS Zigmund Daniel., MD   100 mg at 12/30/17 2108  . sodium chloride flush (NS) 0.9 % injection 3 mL  3 mL Intravenous Q12H Zigmund Daniel., MD   3 mL at 12/31/17 1012  . vitamin B-12 (CYANOCOBALAMIN) tablet 2,000 mcg  2,000 mcg Oral Daily Zigmund Daniel., MD   2,000 mcg at 12/31/17 1012     Discharge Medications: Please see discharge summary for a list of discharge medications.  Relevant Imaging Results:  Relevant Lab Results:   Additional Information SSN [id number]  Antionette Poles, LCSW

## 2017-12-31 NOTE — Progress Notes (Signed)
PROGRESS NOTE    [name]   ZOX:[id number]  DOB: [birth year]  DOA: 12/28/2017 PCP: Donato Schultz, DO   Brief Narrative:  [name]  is an 84 y.o. female past medical history significant for Parkinson's, essential hypertension comes in with shortness of breath and malaise. She is found to have a RML pneumonia. She notes shortness of breath and severe weakness primarily with exertion that has progressively gotten worse.  She can barely walk to the couch. She is unable to stand for a prolonged period of time and this has also gotten progressively worse as well. She vomited in the ED once.    Subjective: Cough is improving. Very weak. Not short of breath at rest. Nausea improved. Eating better but not drinking much.    Assessment & Plan:   Principal Problem:   CAP (community acquired pneumonia) - RML - Ceftriaxone being continued- will add Azithromycin today - has been weaned off of O2 at rest- will obtain ambulatory pulse ox -   Active Problems:   Essential hypertension - BP high- add back Norvasc and Avapro today    Parkinson disease - cont Sinemet and Primidone    Nausea and vomiting - resolving- oral intake improved but have had to encourage increased fluid intake    Syncope and collapse while in the bathroom - occurred in the hospital - HR noted to be in 40s at the time - ? If she was orthostatic vs vasovagal episode (occurred after large BM)  - ? If it was a seizure- Prolactin was elevated 110.5 >  CT head normal- EEG done and is pending   DVT prophylaxis: Lovenox Code Status: Full code Family Communication:  Disposition Plan: SNF vs 24 care at home-  Consultants:   none Procedures:   none Antimicrobials:  Anti-infectives (From admission, onward)   Start     Dose/Rate Route Frequency Ordered Stop   12/31/17 1000  azithromycin (ZITHROMAX) tablet 500 mg     500 mg Oral Daily 12/31/17 0856     12/29/17 0900  cefTRIAXone (ROCEPHIN) 2 g in  sodium chloride 0.9 % 100 mL IVPB     2 g 200 mL/hr over 30 Minutes Intravenous Every 24 hours 12/29/17 0744     12/28/17 1700  levofloxacin (LEVAQUIN) IVPB 750 mg  Status:  Discontinued     750 mg 100 mL/hr over 90 Minutes Intravenous Every 24 hours 12/28/17 1642 12/29/17 0744   12/28/17 1600  azithromycin (ZITHROMAX) 500 mg in sodium chloride 0.9 % 250 mL IVPB  Status:  Discontinued     500 mg 250 mL/hr over 60 Minutes Intravenous  Once 12/28/17 1545 12/28/17 1637   12/28/17 1345  doxycycline (VIBRA-TABS) tablet 100 mg     100 mg Oral  Once 12/28/17 1341 12/28/17 1407   12/28/17 0000  doxycycline (VIBRAMYCIN) 100 MG capsule     100 mg Oral 2 times daily 12/28/17 1344 01/04/18 2359       Objective: Vitals:   12/30/17 2108 12/30/17 2123 12/30/17 2305 12/31/17 0501  BP:  (!) 196/97 (!) 158/64 (!) 156/74  Pulse: 76  85 70  Resp: 18   16  Temp: 98.1 F (36.7 C)   98 F (36.7 C)  TempSrc: Oral     SpO2: 95%   95%  Weight:    116.3 kg  Height:        Intake/Output Summary (Last 24 hours) at 12/31/2017 1256 Last data filed at 12/31/2017 1100 Gross  per 24 hour  Intake 1722.81 ml  Output 750 ml  Net 972.81 ml   Filed Weights   12/29/17 0631 12/30/17 0418 12/31/17 0501  Weight: 114.6 kg 115.4 kg 116.3 kg    Examination: General exam: Appears comfortable  HEENT: PERRLA, oral mucosa moist, no sclera icterus or thrush Respiratory system: Clear to auscultation. Respiratory effort normal. Cardiovascular system: S1 & S2 heard, RRR.   Gastrointestinal system: Abdomen soft, non-tender, nondistended. Normal bowel sound. No organomegaly Central nervous system: Alert and oriented. No focal neurological deficits. Extremities: No cyanosis, clubbing or edema Skin: No rashes or ulcers Psychiatry:  Mood & affect appropriate.     Data Reviewed: I have personally reviewed following labs and imaging studies  CBC: Recent Labs  Lab 12/28/17 1217 12/29/17 0539 12/30/17 0604  WBC 5.8  7.2 7.0  NEUTROABS 3.6  --   --   HGB 11.9* 9.6* 11.3*  HCT 37.2 31.7* 37.3  MCV 88.2 94.9 93.5  PLT 230 181 194   Basic Metabolic Panel: Recent Labs  Lab 12/28/17 1217 12/28/17 1945 12/29/17 0539 12/29/17 1133 12/30/17 0604 12/31/17 0547  NA 144  --  138 145  --  142  K 3.9  --  4.6 3.7  --  3.5  CL 105  --  99 109  --  108  CO2 27  --  29 24  --  25  GLUCOSE 105*  --  92 128*  --  100*  BUN 16  --  32* 16  --  10  CREATININE 0.91  --  0.71 1.08*  --  0.78  CALCIUM 9.2  --  9.0 9.2  --  8.6*  MG  --  1.5*  --   --  2.1  --    GFR: Estimated Creatinine Clearance: 70.3 mL/min (by C-G formula based on SCr of 0.78 mg/dL). Liver Function Tests: Recent Labs  Lab 12/28/17 1217 12/29/17 0539  AST 6* 14*  ALT 5 13  ALKPHOS 106 95  BILITOT 0.8 0.6  PROT 7.2 6.4*  ALBUMIN 4.0 2.8*   No results for input(s): LIPASE, AMYLASE in the last 168 hours. No results for input(s): AMMONIA in the last 168 hours. Coagulation Profile: No results for input(s): INR, PROTIME in the last 168 hours. Cardiac Enzymes: Recent Labs  Lab 12/28/17 1217 12/29/17 1133 12/30/17 1203  CKTOTAL  --   --  34*  TROPONINI <0.03 <0.03  --    BNP (last 3 results) No results for input(s): PROBNP in the last 8760 hours. HbA1C: No results for input(s): HGBA1C in the last 72 hours. CBG: Recent Labs  Lab 12/29/17 1119  GLUCAP 129*   Lipid Profile: No results for input(s): CHOL, HDL, LDLCALC, TRIG, CHOLHDL, LDLDIRECT in the last 72 hours. Thyroid Function Tests: No results for input(s): TSH, T4TOTAL, FREET4, T3FREE, THYROIDAB in the last 72 hours. Anemia Panel: No results for input(s): VITAMINB12, FOLATE, FERRITIN, TIBC, IRON, RETICCTPCT in the last 72 hours. Urine analysis:    Component Value Date/Time   COLORURINE YELLOW 12/28/2017 1135   APPEARANCEUR CLEAR 12/28/2017 1135   LABSPEC 1.015 12/28/2017 1135   PHURINE 7.0 12/28/2017 1135   GLUCOSEU NEGATIVE 12/28/2017 1135   HGBUR NEGATIVE  12/28/2017 1135   HGBUR negative 02/15/2008 1302   BILIRUBINUR NEGATIVE 12/28/2017 1135   BILIRUBINUR neg 08/18/2015 1459   KETONESUR 5 (A) 12/28/2017 1135   PROTEINUR NEGATIVE 12/28/2017 1135   UROBILINOGEN 0.2 08/18/2015 1459   UROBILINOGEN 0.2 08/02/2010 2217  NITRITE NEGATIVE 12/28/2017 1135   LEUKOCYTESUR TRACE (A) 12/28/2017 1135   Sepsis Labs: @LABRCNTIP (procalcitonin:4,lacticidven:4) ) Recent Results (from the past 240 hour(s))  Culture, blood (routine x 2) Call MD if unable to obtain prior to antibiotics being given     Status: None (Preliminary result)   Collection Time: 12/28/17  4:36 PM  Result Value Ref Range Status   Specimen Description   Final    BLOOD BLOOD LEFT FOREARM Performed at [facility], [address] [place], [place] [address]    Special Requests   Final    BOTTLES DRAWN AEROBIC AND ANAEROBIC Blood Culture adequate volume Performed at [facility], [address] [place], [place] [address]    Culture   Final    NO GROWTH 3 DAYS Performed at [facility], [address], [place], [place] [address]    Report Status PENDING  Incomplete  Culture, blood (routine x 2) Call MD if unable to obtain prior to antibiotics being given     Status: None (Preliminary result)   Collection Time: 12/28/17  5:49 PM  Result Value Ref Range Status   Specimen Description   Final    BLOOD BLOOD RIGHT HAND Performed at [facility], [address], [place], [place] [address]    Special Requests   Final    BOTTLES DRAWN AEROBIC AND ANAEROBIC Blood Culture adequate volume Performed at [facility], [address], [place], [place] [address]    Culture   Final    NO GROWTH 3 DAYS Performed at [facility], [address] [place], [place] [address]    Report Status PENDING  Incomplete         Radiology Studies: Ct Head Wo Contrast  Result Date: 12/30/2017 CLINICAL DATA:   Syncope.  Fall yesterday.  Hit back of head EXAM: CT HEAD WITHOUT CONTRAST TECHNIQUE: Contiguous axial images were obtained from the base of the skull through the vertex without intravenous contrast. COMPARISON:  08/25/2006 FINDINGS: Brain: No evidence of acute infarction, hemorrhage, hydrocephalus, extra-axial collection or mass lesion/mass effect. Vascular: No hyperdense vessel or unexpected calcification. Skull: Normal. Negative for fracture or focal lesion. Sinuses/Orbits: No acute finding. Other: None. IMPRESSION: 1. Normal brain.  No acute intracranial abnormalities noted. Electronically Signed   By: Signa Kell M.D.   On: 12/30/2017 11:22      Scheduled Meds: . amantadine  100 mg Oral QHS  . amLODipine  10 mg Oral Daily   And  . irbesartan  300 mg Oral Daily  . azithromycin  500 mg Oral Daily  . carbidopa-levodopa  4 tablet Oral QID  . enoxaparin (LOVENOX) injection  60 mg Subcutaneous Q24H  . famotidine  20 mg Oral BID  . FLUoxetine  40 mg Oral Daily  . OLANZapine  10 mg Oral Daily  . pantoprazole  40 mg Oral BID AC  . pravastatin  20 mg Oral Daily  . primidone  100 mg Oral QHS  . sodium chloride flush  3 mL Intravenous Q12H  . vitamin B-12  2,000 mcg Oral Daily   Continuous Infusions: . cefTRIAXone (ROCEPHIN)  IV 2 g (12/31/17 1016)     LOS: 3 days    Time spent in minutes: 35    Calvert Cantor, MD [facility] Pager: [email or web address] Password [email or web address] 12/31/2017, 12:56 PM

## 2017-12-31 NOTE — Care Management Important Message (Signed)
Important Message  Patient Details  Name: TAMIEKA RANCOURT MRN: [id number] Date of Birth: 1933/08/26   Medicare Important Message Given:  Yes    Caren Macadam 12/31/2017, 11:44 AMImportant Message  Patient Details  Name: KYNLEI PIONTEK MRN: [id number] Date of Birth: 1933/06/10   Medicare Important Message Given:  Yes    Caren Macadam 12/31/2017, 11:44 AM

## 2018-01-01 DIAGNOSIS — E538 Deficiency of other specified B group vitamins: Secondary | ICD-10-CM

## 2018-01-01 DIAGNOSIS — R7989 Other specified abnormal findings of blood chemistry: Secondary | ICD-10-CM

## 2018-01-01 LAB — BASIC METABOLIC PANEL
Anion gap: 9 (ref 5–15)
BUN: 7 mg/dL — ABNORMAL LOW (ref 8–23)
CO2: 28 mmol/L (ref 22–32)
CREATININE: 0.84 mg/dL (ref 0.44–1.00)
Calcium: 8.8 mg/dL — ABNORMAL LOW (ref 8.9–10.3)
Chloride: 105 mmol/L (ref 98–111)
GFR calc non Af Amer: >60 mL/min (ref >60)
Glucose, Bld: 98 mg/dL (ref 70–99)
POTASSIUM: 3.7 mmol/L (ref 3.5–5.1)
SODIUM: 142 mmol/L (ref 135–145)

## 2018-01-01 MED ORDER — AZITHROMYCIN 250 MG PO TABS: 250.0000 mg | ORAL_TABLET | Freq: Every day | ORAL | 0 refills | Status: DC

## 2018-01-01 MED ORDER — CYANOCOBALAMIN 1000 MCG/ML IJ SOLN
1000.0000 ug | Freq: Once | INTRAMUSCULAR | Status: DC
  Filled 2018-01-01: qty 1

## 2018-01-01 MED ORDER — CEFPODOXIME PROXETIL 200 MG PO TABS: 200.0000 mg | ORAL_TABLET | Freq: Two times a day (BID) | ORAL | Status: DC

## 2018-01-01 NOTE — Clinical Social Work Placement (Signed)
Patient received and accepted bed offer at [facility]. Facility initiated [employer] authorization [employer]). Patient will need insurance authorization to discharge to SNF. CSW will continue to follow and assist with discharge planning.  CLINICAL SOCIAL WORK PLACEMENT  NOTE  Date:  01/01/2018  Patient Details  Name: [name] MRN: [id number] Date of Birth: 22-Sep-1933  Clinical Social Work is seeking post-discharge placement for this patient at the Skilled  Nursing Facility level of care (*CSW will initial, date and re-position this form in  chart as items are completed):  Yes   Patient/family provided with [facility] Clinical Social Work Department's list of facilities offering this level of care within the geographic area requested by the patient (or if unable, by the patient's family).  Yes   Patient/family informed of their freedom to choose among providers that offer the needed level of care, that participate in Medicare, Medicaid or managed care program needed by the patient, have an available bed and are willing to accept the patient.  Yes   Patient/family informed of [facility]'s ownership interest in [facility] and [facility], as well as of the fact that they are under no obligation to receive care at these facilities.  PASRR submitted to EDS on       PASRR number received on       Existing PASRR number confirmed on 12/31/17     FL2 transmitted to all facilities in geographic area requested by pt/family on 12/31/17     FL2 transmitted to all facilities within larger geographic area on       Patient informed that his/her managed care company has contracts with or will negotiate with certain facilities, including the following:        Yes   Patient/family informed of bed offers received.  Patient chooses bed at [facility]     Physician recommends and patient chooses bed at      Patient to be transferred to   on   .  Patient to be transferred to facility by       Patient family notified on   of transfer.  Name of family member notified:        PHYSICIAN       Additional Comment:    _______________________________________________ Antionette Poles, LCSW 01/01/2018, 11:26 AM

## 2018-01-01 NOTE — Care Management Note (Signed)
Case Management Note  Patient Details  Name: [name] MRN: [id number] Date of Birth: [birth year]  Subjective/Objective:Medically stable for d/c SNF.                    Action/Plan:dc SNF.   Expected Discharge Date:  01/01/18               Expected Discharge Plan:  Skilled Nursing Facility  In-House Referral:  Clinical Social Work  Discharge planning Services     Post Acute Care Choice:    Choice offered to:     DME Arranged:    DME Agency:     HH Arranged:    HH Agency:     Status of Service:  Completed, signed off  If discussed at [employer], dates discussed:    Additional Comments:  Lanier Clam, RN 01/01/2018, 11:40 AM

## 2018-01-01 NOTE — Progress Notes (Signed)
Assumed care of patient at this time. I agree with previous assessment and will continue to monitor. 

## 2018-01-01 NOTE — Progress Notes (Signed)
SATURATION QUALIFICATIONS: (This note is used to comply with regulatory documentation for home oxygen) ° °Patient Saturations on Room Air at Rest = 99% ° °Patient Saturations on Room Air while Ambulating = 99% ° °Patient Saturations on 0 Liters of oxygen while Ambulating = 99% ° °Please briefly explain why patient needs home oxygen: °

## 2018-01-01 NOTE — Discharge Summary (Signed)
Physician Discharge Summary  [name]:[id number] DOB: 11/19/1933 DOA: 12/28/2017  PCP: Donato Schultz, DO  Admit date: 12/28/2017 Discharge date: 01/01/2018  Admitted From: home  Disposition:  SNF   Recommendations for Outpatient Follow-up:  1. F/u on oral intake especially fluids 2. Stop antibiotics on 10/19  Discharge Condition:  stable   CODE STATUS:  Full code   Diet recommendation:  Heart healthy Consultations:  none    Discharge Diagnoses:  Principal Problem:   CAP (community acquired pneumonia) Active Problems:   Nausea and vomiting   Syncope and collapse   Essential hypertension   Symptomatic Parkinson disease (HCC)   Vitamin B 12 deficiency   Low vitamin D level      Brief Summary: [name] is an 84 y.o.femalepast medical history significant for Parkinson's, essential hypertension comes in with shortness of breath and malaise. She is found to have a RML pneumonia. She notes shortness of breath and severe weakness primarily with exertion that has progressively gotten worse. She can barely walk to the couch. She is unable to stand for a prolonged period of time and this has also gotten progressively worse as well. She vomited in the ED once.    Hospital Course:  Principal Problem:   CAP (community acquired pneumonia)/ acute respiratory failure - RML - given Ceftriaxone Azithromycin- will d/c with Ceftin and cont Zithromax - has been weaned off of O2 now- dyspnea improved - only has a mild cough - strep pneumo antigen negative  Active Problems:   Essential hypertension  cont Norvasc and Avapro      Parkinson disease - cont Sinemet and Primidone    Nausea and vomiting - resolving- oral intake improved      Syncope and collapse while in the bathroom - occurred in the hospital - HR noted to be in 40s at the time - ? If she was orthostatic vs vasovagal episode (occurred after large BM)  - ? If it was a seizure- Prolactin  was elevated 110.5 >  CT head normal - EEG reveals "posterior background slowing" as would be seen in dementia or other diffuse gray matter disturbance  Low Vit D and B12 levels - continue to replace and recheck levels in 1 month- I have given her a dose of s/c B12 while in the hospital as level was only 198 despite being on 2000 mcg of B12 daily   Discharge Exam: Vitals:   12/31/17 2044 01/01/18 0530  BP: (!) 161/76 137/75  Pulse: 77 71  Resp: 18 20  Temp: 98.6 F (37 C) 98.3 F (36.8 C)  SpO2: 100% 98%   Vitals:   12/31/17 0501 12/31/17 1416 12/31/17 2044 01/01/18 0530  BP: (!) 156/74 (!) 157/62 (!) 161/76 137/75  Pulse: 70 71 77 71  Resp: 16 20 18 20   Temp: 98 F (36.7 C) 98.4 F (36.9 C) 98.6 F (37 C) 98.3 F (36.8 C)  TempSrc:  Oral Oral Oral  SpO2: 95% 97% 100% 98%  Weight: 116.3 kg   115.7 kg  Height:        General: Pt is alert, awake, not in acute distress Cardiovascular: RRR, S1/S2 +, no rubs, no gallops Respiratory: CTA bilaterally, no wheezing, no rhonchi Abdominal: Soft, NT, ND, bowel sounds + Extremities: no edema, no cyanosis   Discharge Instructions  Discharge Instructions    Diet - low sodium heart healthy   Complete by:  As directed    Increase activity slowly  Complete by:  As directed      Allergies as of 01/01/2018      Reactions   Codeine Nausea And Vomiting   Gabapentin Swelling   Caused Bilateral feet swelling   Latex Other (See Comments)   Burns the skin   Morphine Sulfate Nausea And Vomiting   Sertraline Hcl    Unknown reaction   Penicillins Other (See Comments)   Has patient had a PCN reaction causing immediate rash, facial/tongue/throat swelling, SOB or lightheadedness with hypotension: Unknown Has patient had a PCN reaction causing severe rash involving mucus membranes or skin necrosis: Unknown Has patient had a PCN reaction that required hospitalization: Unknown Has patient had a PCN reaction occurring within the last 10  years: Unknown If all of the above answers are "NO", then may proceed with Cephalosporin use.      Medication List    TAKE these medications   amantadine 100 MG capsule Commonly known as:  SYMMETREL Take 1 capsule (100 mg total) by mouth 2 (two) times daily. What changed:  when to take this   amLODipine-valsartan 10-320 MG tablet Commonly known as:  EXFORGE TAKE 1 TABLET BY MOUTH EVERY DAY   azithromycin 250 MG tablet Commonly known as:  ZITHROMAX Take 1 tablet (250 mg total) by mouth daily.   B-12 2000 MCG Tabs Take 2,000 mg by mouth daily. What changed:  Another medication with the same name was added. Make sure you understand how and when to take each.   cyanocobalamin 1000 MCG/ML injection Commonly known as:  (VITAMIN B-12) Inject 1ml subq weekly for 4 weeks.  (then once a month, will send in another rx for monthly) What changed:  You were already taking a medication with the same name, and this prescription was added. Make sure you understand how and when to take each.   cyanocobalamin 1000 MCG/ML injection Commonly known as:  (VITAMIN B-12) Inject 1ml subq monthly. (please make sure fill the weekly rx first) What changed:  You were already taking a medication with the same name, and this prescription was added. Make sure you understand how and when to take each.   carbidopa-levodopa 25-100 MG tablet Commonly known as:  SINEMET IR Take 4 tablets by mouth 4 (four) times daily.   cefpodoxime 200 MG tablet Commonly known as:  VANTIN Take 1 tablet (200 mg total) by mouth 2 (two) times daily.   desoximetasone 0.05 % cream Commonly known as:  TOPICORT APPLY TO AFFECTED AREA TWICE A DAY What changed:  See the new instructions.   famotidine 20 MG tablet Commonly known as:  PEPCID Take 20 mg by mouth 2 (two) times daily.   FLUoxetine 40 MG capsule Commonly known as:  PROZAC TAKE 1 CAPSULE BY MOUTH EVERY DAY   OLANZapine 10 MG tablet Commonly known as:   ZYPREXA TAKE 1 TABLET BY MOUTH EVERY DAY   pantoprazole 40 MG tablet Commonly known as:  PROTONIX Take 1 tablet (40 mg total) by mouth 2 (two) times daily before a meal.   pravastatin 20 MG tablet Commonly known as:  PRAVACHOL TAKE 1 TABLET BY MOUTH ONCE DAILY   primidone 50 MG tablet Commonly known as:  MYSOLINE Take 100 mg by mouth at bedtime.   Vitamin D (Ergocalciferol) 50000 units Caps capsule Commonly known as:  DRISDOL Take 1 capsule (50,000 Units total) by mouth every 7 (seven) days.   VITAMIN D3 PO Take 1 tablet by mouth daily. What changed:  Another medication with the same  name was added. Make sure you understand how and when to take each.   Vitamin D-3 1000 units Caps Take 1 capsule (1,000 Units total) by mouth daily. What changed:  You were already taking a medication with the same name, and this prescription was added. Make sure you understand how and when to take each.      Follow-up Information    Schedule an appointment as soon as possible for a visit  with Zola Button, Grayling Congress, DO.   Specialty:  Family Medicine Contact information: [address] [place] [address] [phone]          Allergies  Allergen Reactions  . Codeine Nausea And Vomiting  . Gabapentin Swelling    Caused Bilateral feet swelling  . Latex Other (See Comments)    Burns the skin  . Morphine Sulfate Nausea And Vomiting  . Sertraline Hcl     Unknown reaction  . Penicillins Other (See Comments)    Has patient had a PCN reaction causing immediate rash, facial/tongue/throat swelling, SOB or lightheadedness with hypotension: Unknown Has patient had a PCN reaction causing severe rash involving mucus membranes or skin necrosis: Unknown Has patient had a PCN reaction that required hospitalization: Unknown Has patient had a PCN reaction occurring within the last 10 years: Unknown If all of the above answers are "NO", then may proceed with Cephalosporin use.       Procedures/Studies:    Dg Chest 2 View  Result Date: 12/28/2017 CLINICAL DATA:  Difficulty breathing EXAM: CHEST - 2 VIEW COMPARISON:  02/28/2017 FINDINGS: Cardiac shadow is mildly enlarged but accentuated by the frontal technique. The lungs are well aerated bilaterally. Right middle lobe infiltrate is seen. No sizable effusion is noted. No bony abnormality is seen. IMPRESSION: Mild right middle lobe infiltrate. Electronically Signed   By: Alcide Clever M.D.   On: 12/28/2017 12:49   Ct Head Wo Contrast  Result Date: 12/30/2017 CLINICAL DATA:  Syncope.  Fall yesterday.  Hit back of head EXAM: CT HEAD WITHOUT CONTRAST TECHNIQUE: Contiguous axial images were obtained from the base of the skull through the vertex without intravenous contrast. COMPARISON:  08/25/2006 FINDINGS: Brain: No evidence of acute infarction, hemorrhage, hydrocephalus, extra-axial collection or mass lesion/mass effect. Vascular: No hyperdense vessel or unexpected calcification. Skull: Normal. Negative for fracture or focal lesion. Sinuses/Orbits: No acute finding. Other: None. IMPRESSION: 1. Normal brain.  No acute intracranial abnormalities noted. Electronically Signed   By: Signa Kell M.D.   On: 12/30/2017 11:22     The results of significant diagnostics from this hospitalization (including imaging, microbiology, ancillary and laboratory) are listed below for reference.     Microbiology: Recent Results (from the past 240 hour(s))  Culture, blood (routine x 2) Call MD if unable to obtain prior to antibiotics being given     Status: None (Preliminary result)   Collection Time: 12/28/17  4:36 PM  Result Value Ref Range Status   Specimen Description   Final    BLOOD BLOOD LEFT FOREARM Performed at [facility], [address], [place], [place] [address]    Special Requests   Final    BOTTLES DRAWN AEROBIC AND ANAEROBIC Blood Culture adequate volume Performed at [facility],  [address], [place], [place] [address]    Culture   Final    NO GROWTH 4 DAYS Performed at [facility], [address] [place], [place] [address]    Report  Status PENDING  Incomplete  Culture, blood (routine x 2) Call MD if unable to obtain prior to antibiotics being given     Status: None (Preliminary result)   Collection Time: 12/28/17  5:49 PM  Result Value Ref Range Status   Specimen Description   Final    BLOOD BLOOD RIGHT HAND Performed at [facility], [address] [place], [place] [address]    Special Requests   Final    BOTTLES DRAWN AEROBIC AND ANAEROBIC Blood Culture adequate volume Performed at [facility], [address], [place], [place] [address]    Culture   Final    NO GROWTH 4 DAYS Performed at [facility], [address], [place], [place] [address]    Report Status PENDING  Incomplete     Labs: BNP (last 3 results) Recent Labs    12/28/17 1217  BNP 27.0   Basic Metabolic Panel: Recent Labs  Lab 12/28/17 1217 12/28/17 1945 12/29/17 0539 12/29/17 1133 12/30/17 0604 12/31/17 0547 01/01/18 0545  NA 144  --  138 145  --  142 142  K 3.9  --  4.6 3.7  --  3.5 3.7  CL 105  --  99 109  --  108 105  CO2 27  --  29 24  --  25 28  GLUCOSE 105*  --  92 128*  --  100* 98  BUN 16  --  32* 16  --  10 7*  CREATININE 0.91  --  0.71 1.08*  --  0.78 0.84  CALCIUM 9.2  --  9.0 9.2  --  8.6* 8.8*  MG  --  1.5*  --   --  2.1  --   --    Liver Function Tests: Recent Labs  Lab 12/28/17 1217 12/29/17 0539  AST 6* 14*  ALT 5 13  ALKPHOS 106 95  BILITOT 0.8 0.6  PROT 7.2 6.4*  ALBUMIN 4.0 2.8*   No results for input(s): LIPASE, AMYLASE in the last 168 hours. No results for input(s): AMMONIA in the last 168 hours. CBC: Recent Labs  Lab 12/28/17 1217 12/29/17 0539 12/30/17 0604  WBC 5.8 7.2 7.0  NEUTROABS 3.6  --   --   HGB 11.9* 9.6* 11.3*  HCT 37.2 31.7* 37.3  MCV 88.2 94.9 93.5  PLT 230  181 194   Cardiac Enzymes: Recent Labs  Lab 12/28/17 1217 12/29/17 1133 12/30/17 1203  CKTOTAL  --   --  34*  TROPONINI <0.03 <0.03  --    BNP: Invalid input(s): POCBNP CBG: Recent Labs  Lab 12/29/17 1119  GLUCAP 129*   D-Dimer No results for input(s): DDIMER in the last 72 hours. Hgb A1c No results for input(s): HGBA1C in the last 72 hours. Lipid Profile No results for input(s): CHOL, HDL, LDLCALC, TRIG, CHOLHDL, LDLDIRECT in the last 72 hours. Thyroid function studies No results for input(s): TSH, T4TOTAL, T3FREE, THYROIDAB in the last 72 hours.  Invalid input(s): FREET3 Anemia work up No results for input(s): VITAMINB12, FOLATE, FERRITIN, TIBC, IRON, RETICCTPCT in the last 72 hours. Urinalysis    Component Value Date/Time   COLORURINE YELLOW 12/28/2017 1135   APPEARANCEUR CLEAR 12/28/2017 1135   LABSPEC 1.015 12/28/2017 1135   PHURINE 7.0 12/28/2017 1135   GLUCOSEU NEGATIVE 12/28/2017 1135   HGBUR NEGATIVE 12/28/2017 1135   HGBUR negative 02/15/2008 1302   BILIRUBINUR NEGATIVE 12/28/2017 1135   BILIRUBINUR neg 08/18/2015 1459   KETONESUR 5 (  A) 12/28/2017 1135   PROTEINUR NEGATIVE 12/28/2017 1135   UROBILINOGEN 0.2 08/18/2015 1459   UROBILINOGEN 0.2 08/02/2010 2217   NITRITE NEGATIVE 12/28/2017 1135   LEUKOCYTESUR TRACE (A) 12/28/2017 1135   Sepsis Labs Invalid input(s): PROCALCITONIN,  WBC,  LACTICIDVEN Microbiology Recent Results (from the past 240 hour(s))  Culture, blood (routine x 2) Call MD if unable to obtain prior to antibiotics being given     Status: None (Preliminary result)   Collection Time: 12/28/17  4:36 PM  Result Value Ref Range Status   Specimen Description   Final    BLOOD BLOOD LEFT FOREARM Performed at [facility], [address] [place], [place] [address]    Special Requests   Final    BOTTLES DRAWN AEROBIC AND ANAEROBIC Blood Culture adequate volume Performed at [facility], [address] [place], [place] [address]    Culture   Final    NO GROWTH 4 DAYS Performed at [facility], [address] [place], [place] [address]    Report Status PENDING  Incomplete  Culture, blood (routine x 2) Call MD if unable to obtain prior to antibiotics being given     Status: None (Preliminary result)   Collection Time: 12/28/17  5:49 PM  Result Value Ref Range Status   Specimen Description   Final    BLOOD BLOOD RIGHT HAND Performed at [facility], [address], [place], [place] [address]    Special Requests   Final    BOTTLES DRAWN AEROBIC AND ANAEROBIC Blood Culture adequate volume Performed at [facility], [address], [place], [place] [address]    Culture   Final    NO GROWTH 4 DAYS Performed at [facility], [address] [place], [place] [address]    Report Status PENDING  Incomplete     Time coordinating discharge in minutes: 60  SIGNED:   Calvert Cantor, MD  [facility] 01/01/2018, 10:26 AM Pager   If 7PM-7AM, please contact night-coverage [email or web address] Password [email or web address]

## 2018-01-02 ENCOUNTER — Encounter (HOSPITAL_COMMUNITY): Payer: Self-pay

## 2018-01-02 LAB — CULTURE, BLOOD (ROUTINE X 2)
CULTURE: NO GROWTH
Culture: NO GROWTH
Special Requests: ADEQUATE
Special Requests: ADEQUATE

## 2018-01-02 LAB — BASIC METABOLIC PANEL
ANION GAP: 8 (ref 5–15)
BUN: 14 mg/dL (ref 8–23)
CHLORIDE: 107 mmol/L (ref 98–111)
CO2: 28 mmol/L (ref 22–32)
CREATININE: 0.84 mg/dL (ref 0.44–1.00)
Calcium: 8.8 mg/dL — ABNORMAL LOW (ref 8.9–10.3)
GFR calc non Af Amer: >60 mL/min (ref >60)
Glucose, Bld: 101 mg/dL — ABNORMAL HIGH (ref 70–99)
POTASSIUM: 3.4 mmol/L — AB (ref 3.5–5.1)
SODIUM: 143 mmol/L (ref 135–145)

## 2018-01-02 MED ORDER — CEFPODOXIME PROXETIL 200 MG PO TABS
200.0000 mg | ORAL_TABLET | Freq: Two times a day (BID) | ORAL | Status: DC
  Administered 2018-01-02: 200 mg via ORAL
  Filled 2018-01-02: qty 1

## 2018-01-02 MED ORDER — POTASSIUM CHLORIDE CRYS ER 20 MEQ PO TBCR
40.0000 meq | EXTENDED_RELEASE_TABLET | Freq: Once | ORAL | Status: AC
  Administered 2018-01-02: 40 meq via ORAL
  Filled 2018-01-02: qty 2

## 2018-01-02 NOTE — Progress Notes (Signed)
Physical Therapy Treatment Patient Details Name: [name] MRN: [id number] DOB: [birth year] Today's Date: 01/02/2018    History of Present Illness 84 y.o. female past medical history significant for Parkinson's, essential hypertension, chronic pain, lumbar spine surgery and admitted for CAP.    PT Comments    Pt able to tolerate improved ambulation distance today however required seated rest break.  Pt awaiting d/c to SNF.   Follow Up Recommendations  SNF;Supervision/Assistance - 24 hour     Equipment Recommendations  None recommended by PT    Recommendations for Other Services       Precautions / Restrictions Precautions Precautions: Fall    Mobility  Bed Mobility Overal bed mobility: Needs Assistance Bed Mobility: Supine to Sit     Supine to sit: Min assist;HOB elevated     General bed mobility comments: increased time and effort; slight assist for trunk  Transfers Overall transfer level: Needs assistance Equipment used: Rolling walker (2 wheeled) Transfers: Sit to/from Stand Sit to Stand: Min guard         General transfer comment: verbal cues for hand placement  Ambulation/Gait Ambulation/Gait assistance: Min guard;Min assist Gait Distance (Feet): 20 Feet(x2) Assistive device: Rolling walker (2 wheeled) Gait Pattern/deviations: Step-through pattern;Decreased stride length;Wide base of support     General Gait Details: verbal cues for remaining in RW, posture, SPO2 97% on room air, pt required one seated rest break due to fatigue   Stairs             Wheelchair Mobility    Modified Rankin (Stroke Patients Only)       Balance                                            Cognition Arousal/Alertness: Awake/alert Behavior During Therapy: WFL for tasks assessed/performed Overall Cognitive Status: Within Functional Limits for tasks assessed                                        Exercises       General Comments        Pertinent Vitals/Pain Pain Assessment: No/denies pain    Home Living                      Prior Function            PT Goals (current goals can now be found in the care plan section) Progress towards PT goals: Progressing toward goals    Frequency    Min 3X/week      PT Plan Current plan remains appropriate    Co-evaluation              AM-PAC PT "6 Clicks" Daily Activity  Outcome Measure  Difficulty turning over in bed (including adjusting bedclothes, sheets and blankets)?: A Lot Difficulty moving from lying on back to sitting on the side of the bed? : A Lot Difficulty sitting down on and standing up from a chair with arms (e.g., wheelchair, bedside commode, etc,.)?: A Lot Help needed moving to and from a bed to chair (including a wheelchair)?: A Little Help needed walking in hospital room?: A Little Help needed climbing 3-5 steps with a railing? : A Lot 6 Click Score: 14    End of Session  Equipment Utilized During Treatment: Gait belt Activity Tolerance: Patient tolerated treatment well Patient left: in chair;with chair alarm set;with call bell/phone within reach;with family/visitor present Nurse Communication: Mobility status PT Visit Diagnosis: Other abnormalities of gait and mobility (R26.89)     Time: 1610-9604 PT Time Calculation (min) (ACUTE ONLY): 22 min  Charges:  $Gait Training: 8-22 mins                     Zenovia Jarred, PT, DPT Acute Rehabilitation Services Office: [phone] Pager: [phone]  Sarajane Jews 01/02/2018, 1:33 PM

## 2018-01-02 NOTE — Clinical Social Work Placement (Signed)
Patient received and accepted bed offer at [facility] SN[facility]. Facility aware of discharge and confirmed bed offer [facility] insurance authorization received). PTAR contacted, patient's family notified. Patient's RN can call report to [phone] Room 809P [facility], packet complete. CSW signing off, no other needs identified at this time.  CLINICAL SOCIAL WORK PLACEMENT  NOTE  Date:  01/02/2018  Patient Details  Name: QUANTASIA STEGNER MRN: [id number] Date of Birth: 04/02/1933  Clinical Social Work is seeking post-discharge placement for this patient at the Skilled  Nursing Facility level of care (*CSW will initial, date and re-position this form in  chart as items are completed):  Yes   Patient/family provided with [facility] Clinical Social Work Department's list of facilities offering this level of care within the geographic area requested by the patient (or if unable, by the patient's family).  Yes   Patient/family informed of their freedom to choose among providers that offer the needed level of care, that participate in Medicare, Medicaid or managed care program needed by the patient, have an available bed and are willing to accept the patient.  Yes   Patient/family informed of [facility]'s ownership interest in [facility] and [facility], as well as of the fact that they are under no obligation to receive care at these facilities.  PASRR submitted to EDS on       PASRR number received on       Existing PASRR number confirmed on 12/31/17     FL2 transmitted to all facilities in geographic area requested by pt/family on 12/31/17     FL2 transmitted to all facilities within larger geographic area on       Patient informed that his/her managed care company has contracts with or will negotiate with certain facilities, including the following:        Yes   Patient/family informed of bed offers received.  Patient chooses bed at [facility]     Physician recommends and  patient chooses bed at      Patient to be transferred to [facility] on 01/02/18.  Patient to be transferred to facility by PTAR     Patient family notified on 01/02/18 of transfer.  Name of family member notified:  Venora Maples     PHYSICIAN       Additional Comment:    _______________________________________________ Antionette Poles, LCSW 01/02/2018, 10:01 AM

## 2018-01-05 ENCOUNTER — Telehealth: Payer: Self-pay

## 2018-01-05 NOTE — Telephone Encounter (Signed)
Called patients daughter states patient is in a SNF at this time. Would like a call back from provider.

## 2018-01-05 NOTE — Telephone Encounter (Signed)
Did call daughter  Pt is in snf for rehab-- still with abd pain but pepcid may be helping F/u when d/c from snf

## 2018-01-12 ENCOUNTER — Telehealth: Payer: Self-pay | Admitting: *Deleted

## 2018-01-12 DIAGNOSIS — J189 Pneumonia, unspecified organism: Secondary | ICD-10-CM

## 2018-01-12 NOTE — Telephone Encounter (Signed)
Pt is still in SNF, she will discharge tomorrow.  She would like for Dr Laury Axon to put in order and plan on getting xray after discharge  cb is [phone]

## 2018-01-12 NOTE — Telephone Encounter (Signed)
Is she still in SNF?  ----  If so the Dr in the facility would order.  If home--- ok to put order in for xray

## 2018-01-12 NOTE — Telephone Encounter (Signed)
Order put in for tomorrow when she gets discharged.

## 2018-01-12 NOTE — Telephone Encounter (Signed)
Copied from CRM [id number]. Topic: General - Other >> Jan 12, 2018 10:50 AM Trula Slade wrote: Reason for CRM:   Patient's daughter, Kerriann Kamphuis, would like a chest X-ray ordered to see if her mother is over the Pneumonia.  Patient is finished with the antibiotic and she is still coughing.

## 2018-01-14 ENCOUNTER — Ambulatory Visit (HOSPITAL_BASED_OUTPATIENT_CLINIC_OR_DEPARTMENT_OTHER)
Admission: RE | Admit: 2018-01-14 | Discharge: 2018-01-14 | Disposition: A | Discharge status: Home / Self Care | Payer: Medicare Other | Source: Ambulatory Visit | Attending: Family Medicine | Admitting: Family Medicine

## 2018-01-14 DIAGNOSIS — J189 Pneumonia, unspecified organism: Secondary | ICD-10-CM

## 2018-01-14 DIAGNOSIS — I517 Cardiomegaly: Secondary | ICD-10-CM | POA: Diagnosis not present

## 2018-01-14 DIAGNOSIS — R918 Other nonspecific abnormal finding of lung field: Secondary | ICD-10-CM | POA: Diagnosis not present

## 2018-01-16 ENCOUNTER — Other Ambulatory Visit: Payer: Self-pay

## 2018-01-16 ENCOUNTER — Telehealth: Payer: Self-pay | Admitting: Family Medicine

## 2018-01-16 MED ORDER — LEVOFLOXACIN 500 MG PO TABS: 500.0000 mg | ORAL_TABLET | Freq: Every day | ORAL | 0 refills | Status: DC

## 2018-01-16 NOTE — Telephone Encounter (Signed)
Xray was done yesterday and new abx started today Wait 2-3 weeks for another one

## 2018-01-16 NOTE — Telephone Encounter (Signed)
Copied from CRM [id number]. Topic: Quick Communication - Home Health Verbal Orders >> Jan 16, 2018  3:58 PM Mickel Baas B, [place] wrote: Caller/Agency: Vernona Rieger with [facility] Callback Number: [phone] Requesting OT/PT/Skilled Nursing/Social Work: -- Nursing, PT, OT, and aide services Frequency:  Aide: 2x a week for 2 weeks 1x a week for 1 week  Nursing: 1x a week for 1 week 2x a week for 2 weeks 1x a week for 1 week  PT: 1x a week for 1 week  OT: 1x a week for 1 week  Patient received a call stating that she still has pneumonia. Started antibiotics. Wants to know if Dr Laury Axon wants another chest x-ray? Can get portable there. Also, states that patient is having 8/10 neuropathy pain.

## 2018-01-16 NOTE — Progress Notes (Signed)
le

## 2018-01-16 NOTE — Telephone Encounter (Signed)
Please look at last part of note

## 2018-01-19 ENCOUNTER — Emergency Department (HOSPITAL_COMMUNITY): Payer: Medicare Other

## 2018-01-19 ENCOUNTER — Telehealth: Payer: Self-pay | Admitting: Family Medicine

## 2018-01-19 ENCOUNTER — Observation Stay (HOSPITAL_COMMUNITY): Payer: Medicare Other

## 2018-01-19 ENCOUNTER — Other Ambulatory Visit: Payer: Self-pay

## 2018-01-19 ENCOUNTER — Inpatient Hospital Stay (HOSPITAL_COMMUNITY)
Admission: EM | Admit: 2018-01-19 | Discharge: 2018-01-22 | DRG: 176 | Disposition: A | Discharge status: Home Health | Payer: Medicare Other | Attending: Family Medicine | Admitting: Family Medicine

## 2018-01-19 ENCOUNTER — Encounter (HOSPITAL_COMMUNITY): Payer: Self-pay | Admitting: Emergency Medicine

## 2018-01-19 DIAGNOSIS — R9431 Abnormal electrocardiogram [ECG] [EKG]: Secondary | ICD-10-CM | POA: Diagnosis present

## 2018-01-19 DIAGNOSIS — Z888 Allergy status to other drugs, medicaments and biological substances status: Secondary | ICD-10-CM

## 2018-01-19 DIAGNOSIS — E785 Hyperlipidemia, unspecified: Secondary | ICD-10-CM | POA: Diagnosis present

## 2018-01-19 DIAGNOSIS — D638 Anemia in other chronic diseases classified elsewhere: Secondary | ICD-10-CM | POA: Diagnosis present

## 2018-01-19 DIAGNOSIS — Z91040 Latex allergy status: Secondary | ICD-10-CM

## 2018-01-19 DIAGNOSIS — I160 Hypertensive urgency: Secondary | ICD-10-CM | POA: Diagnosis present

## 2018-01-19 DIAGNOSIS — Z820 Family history of epilepsy and other diseases of the nervous system: Secondary | ICD-10-CM

## 2018-01-19 DIAGNOSIS — I10 Essential (primary) hypertension: Secondary | ICD-10-CM | POA: Diagnosis present

## 2018-01-19 DIAGNOSIS — N179 Acute kidney failure, unspecified: Secondary | ICD-10-CM | POA: Diagnosis not present

## 2018-01-19 DIAGNOSIS — R531 Weakness: Secondary | ICD-10-CM

## 2018-01-19 DIAGNOSIS — Z885 Allergy status to narcotic agent status: Secondary | ICD-10-CM

## 2018-01-19 DIAGNOSIS — G20A1 Parkinson's disease without dyskinesia, without mention of fluctuations: Secondary | ICD-10-CM | POA: Diagnosis present

## 2018-01-19 DIAGNOSIS — M1990 Unspecified osteoarthritis, unspecified site: Secondary | ICD-10-CM | POA: Diagnosis present

## 2018-01-19 DIAGNOSIS — G894 Chronic pain syndrome: Secondary | ICD-10-CM | POA: Diagnosis present

## 2018-01-19 DIAGNOSIS — R0902 Hypoxemia: Secondary | ICD-10-CM | POA: Diagnosis present

## 2018-01-19 DIAGNOSIS — E860 Dehydration: Secondary | ICD-10-CM | POA: Diagnosis present

## 2018-01-19 DIAGNOSIS — Z6841 Body Mass Index (BMI) 40.0 and over, adult: Secondary | ICD-10-CM

## 2018-01-19 DIAGNOSIS — Z880 Allergy status to penicillin: Secondary | ICD-10-CM

## 2018-01-19 DIAGNOSIS — Z90710 Acquired absence of both cervix and uterus: Secondary | ICD-10-CM

## 2018-01-19 DIAGNOSIS — M4186 Other forms of scoliosis, lumbar region: Secondary | ICD-10-CM | POA: Diagnosis present

## 2018-01-19 DIAGNOSIS — E669 Obesity, unspecified: Secondary | ICD-10-CM | POA: Diagnosis present

## 2018-01-19 DIAGNOSIS — I2699 Other pulmonary embolism without acute cor pulmonale: Principal | ICD-10-CM | POA: Diagnosis present

## 2018-01-19 DIAGNOSIS — I272 Pulmonary hypertension, unspecified: Secondary | ICD-10-CM | POA: Diagnosis present

## 2018-01-19 DIAGNOSIS — G250 Essential tremor: Secondary | ICD-10-CM | POA: Diagnosis present

## 2018-01-19 DIAGNOSIS — Z823 Family history of stroke: Secondary | ICD-10-CM

## 2018-01-19 DIAGNOSIS — E538 Deficiency of other specified B group vitamins: Secondary | ICD-10-CM | POA: Diagnosis present

## 2018-01-19 DIAGNOSIS — G629 Polyneuropathy, unspecified: Secondary | ICD-10-CM | POA: Diagnosis present

## 2018-01-19 DIAGNOSIS — E440 Moderate protein-calorie malnutrition: Secondary | ICD-10-CM | POA: Diagnosis present

## 2018-01-19 DIAGNOSIS — G20 Parkinson's disease: Secondary | ICD-10-CM | POA: Diagnosis present

## 2018-01-19 DIAGNOSIS — F329 Major depressive disorder, single episode, unspecified: Secondary | ICD-10-CM | POA: Diagnosis present

## 2018-01-19 DIAGNOSIS — Z79899 Other long term (current) drug therapy: Secondary | ICD-10-CM

## 2018-01-19 LAB — CBC WITH DIFFERENTIAL/PLATELET
Abs Immature Granulocytes: 0.02 10*3/uL (ref 0.00–0.07)
Basophils Absolute: 0 10*3/uL (ref 0.0–0.1)
Basophils Relative: 0 %
EOS ABS: 0.2 10*3/uL (ref 0.0–0.5)
EOS PCT: 3 %
HEMATOCRIT: 35.6 % — AB (ref 36.0–46.0)
Hemoglobin: 11.1 g/dL — ABNORMAL LOW (ref 12.0–15.0)
IMMATURE GRANULOCYTES: 0 %
Lymphocytes Relative: 22 %
Lymphs Abs: 1.5 10*3/uL (ref 0.7–4.0)
MCH: 27.7 pg (ref 26.0–34.0)
MCHC: 31.2 g/dL (ref 30.0–36.0)
MCV: 88.8 fL (ref 80.0–100.0)
MONOS PCT: 15 %
Monocytes Absolute: 1 10*3/uL (ref 0.1–1.0)
NEUTROS PCT: 60 %
Neutro Abs: 4.1 10*3/uL (ref 1.7–7.7)
PLATELETS: 209 10*3/uL (ref 150–400)
RBC: 4.01 MIL/uL (ref 3.87–5.11)
RDW: 17.8 % — ABNORMAL HIGH (ref 11.5–15.5)
WBC: 6.8 10*3/uL (ref 4.0–10.5)
nRBC: 0 % (ref 0.0–0.2)

## 2018-01-19 LAB — COMPREHENSIVE METABOLIC PANEL
ALT: <5 U/L (ref 0–44)
ANION GAP: 13 (ref 5–15)
AST: 8 U/L — AB (ref 15–41)
Albumin: 3.7 g/dL (ref 3.5–5.0)
Alkaline Phosphatase: 89 U/L (ref 38–126)
BILIRUBIN TOTAL: 0.9 mg/dL (ref 0.3–1.2)
BUN: 18 mg/dL (ref 8–23)
CALCIUM: 8.9 mg/dL (ref 8.9–10.3)
CO2: 25 mmol/L (ref 22–32)
CREATININE: 1.3 mg/dL — AB (ref 0.44–1.00)
Chloride: 103 mmol/L (ref 98–111)
GFR calc non Af Amer: 37 mL/min — ABNORMAL LOW (ref >60)
GFR, EST AFRICAN AMERICAN: 43 mL/min — AB (ref >60)
Glucose, Bld: 94 mg/dL (ref 70–99)
Potassium: 3.5 mmol/L (ref 3.5–5.1)
Sodium: 141 mmol/L (ref 135–145)
TOTAL PROTEIN: 6.8 g/dL (ref 6.5–8.1)

## 2018-01-19 MED ORDER — ACETAMINOPHEN 650 MG RE SUPP: 650.0000 mg | Freq: Four times a day (QID) | RECTAL | Status: DC | PRN

## 2018-01-19 MED ORDER — AMANTADINE HCL 100 MG PO CAPS
100.0000 mg | ORAL_CAPSULE | Freq: Every day | ORAL | Status: DC
  Administered 2018-01-19 – 2018-01-21 (×3): 100 mg via ORAL
  Filled 2018-01-19 (×3): qty 1

## 2018-01-19 MED ORDER — PRIMIDONE 50 MG PO TABS
100.0000 mg | ORAL_TABLET | Freq: Every day | ORAL | Status: DC
  Administered 2018-01-19 – 2018-01-21 (×3): 100 mg via ORAL
  Filled 2018-01-19 (×3): qty 2

## 2018-01-19 MED ORDER — PANTOPRAZOLE SODIUM 40 MG PO TBEC
40.0000 mg | DELAYED_RELEASE_TABLET | Freq: Two times a day (BID) | ORAL | Status: DC
  Administered 2018-01-19 – 2018-01-22 (×6): 40 mg via ORAL
  Filled 2018-01-19 (×6): qty 1

## 2018-01-19 MED ORDER — HYDRALAZINE HCL 20 MG/ML IJ SOLN: 5.0000 mg | INTRAMUSCULAR | Status: DC | PRN

## 2018-01-19 MED ORDER — SODIUM CHLORIDE 0.9 % IV SOLN
INTRAVENOUS | Status: DC
  Administered 2018-01-20 (×2): via INTRAVENOUS

## 2018-01-19 MED ORDER — HYDRALAZINE HCL 20 MG/ML IJ SOLN
5.0000 mg | Freq: Once | INTRAMUSCULAR | Status: DC
  Filled 2018-01-19: qty 1

## 2018-01-19 MED ORDER — CARBIDOPA-LEVODOPA 25-100 MG PO TABS
4.0000 | ORAL_TABLET | Freq: Four times a day (QID) | ORAL | Status: DC
  Administered 2018-01-19 – 2018-01-22 (×11): 4 via ORAL
  Filled 2018-01-19 (×11): qty 4

## 2018-01-19 MED ORDER — VITAMIN D (ERGOCALCIFEROL) 1.25 MG (50000 UNIT) PO CAPS
50000.0000 [IU] | ORAL_CAPSULE | ORAL | Status: DC
  Administered 2018-01-20: 50000 [IU] via ORAL
  Filled 2018-01-19: qty 1

## 2018-01-19 MED ORDER — ACETAMINOPHEN 325 MG PO TABS: 650.0000 mg | ORAL_TABLET | Freq: Four times a day (QID) | ORAL | Status: DC | PRN

## 2018-01-19 MED ORDER — ENOXAPARIN SODIUM 40 MG/0.4ML ~~LOC~~ SOLN
40.0000 mg | Freq: Every day | SUBCUTANEOUS | Status: DC
  Administered 2018-01-20: 40 mg via SUBCUTANEOUS
  Filled 2018-01-19: qty 0.4

## 2018-01-19 MED ORDER — OLANZAPINE 5 MG PO TABS
10.0000 mg | ORAL_TABLET | Freq: Every day | ORAL | Status: DC
  Administered 2018-01-20 – 2018-01-22 (×3): 10 mg via ORAL
  Filled 2018-01-19 (×3): qty 2

## 2018-01-19 MED ORDER — ENSURE ENLIVE PO LIQD
237.0000 mL | Freq: Two times a day (BID) | ORAL | Status: DC
  Administered 2018-01-20 – 2018-01-22 (×5): 237 mL via ORAL

## 2018-01-19 MED ORDER — FLUOXETINE HCL 20 MG PO CAPS
40.0000 mg | ORAL_CAPSULE | Freq: Every day | ORAL | Status: DC
  Administered 2018-01-20 – 2018-01-22 (×3): 40 mg via ORAL
  Filled 2018-01-19 (×3): qty 2

## 2018-01-19 MED ORDER — SODIUM CHLORIDE 0.9 % IV BOLUS
1000.0000 mL | Freq: Once | INTRAVENOUS | Status: AC
  Administered 2018-01-19: 1000 mL via INTRAVENOUS

## 2018-01-19 MED ORDER — FAMOTIDINE 20 MG PO TABS
20.0000 mg | ORAL_TABLET | Freq: Two times a day (BID) | ORAL | Status: DC
  Administered 2018-01-19 – 2018-01-22 (×6): 20 mg via ORAL
  Filled 2018-01-19 (×6): qty 1

## 2018-01-19 MED ORDER — PRAVASTATIN SODIUM 20 MG PO TABS
20.0000 mg | ORAL_TABLET | Freq: Every day | ORAL | Status: DC
  Administered 2018-01-20 – 2018-01-22 (×3): 20 mg via ORAL
  Filled 2018-01-19 (×3): qty 1

## 2018-01-19 MED ORDER — AMLODIPINE BESYLATE-VALSARTAN 10-320 MG PO TABS: 1.0000 | ORAL_TABLET | Freq: Every day | ORAL | Status: DC

## 2018-01-19 NOTE — ED Provider Notes (Signed)
[facility]-EMERGENCY DEPT Provider Note   CSN: [id number] Arrival date & time: 01/19/18  1830     History   Chief Complaint Chief Complaint  Patient presents with  . Shortness of Breath  . Peripheral Neuropathy    HPI MATRICIA BEGNAUD is a 84 y.o. female.  Patient complains of weakness and dizziness.  She not been eating or drinking much for the last week.  She was hospitalized with pneumonia and in rehab and then has been home for a week  The history is provided by the patient. No language interpreter was used.  Weakness  Primary symptoms include no focal weakness. This is a new problem. The current episode started more than 2 days ago. The problem has not changed since onset.There was no focality noted. There has been no fever (No fever). Pertinent negatives include no shortness of breath, no chest pain and no headaches. There were no medications administered prior to arrival. Associated medical issues do not include trauma.    Past Medical History:  Diagnosis Date  . Allergic drug reaction    Opioids  . 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 Back pain, thoracic   . Benign familial tremor   . Bruise   . Calf pain   . Chest pain    -- Myoview negative 2007  . Chronic pain syndrome   . Depression   . Edema   . Gallstones   . Hypertension   . Hypokalemia   . Meniscus tear   . Myalgia   . Osteoarthritis   . Other symptoms referable to lower leg joint   . Other urinary incontinence   . Scoliosis   . Skin rash   . Tinea corporis     Patient Active Problem List   Diagnosis Date Noted  . Hypertensive urgency 01/19/2018  . Vitamin B 12 deficiency 01/01/2018  . Low vitamin D level 01/01/2018  . Syncope and collapse 12/30/2017  . Nausea and vomiting 12/29/2017  . Prolonged QT interval 12/29/2017  . CAP (community acquired pneumonia) 12/28/2017  . Dyspepsia 09/25/2017  . Aortic ectasia (HCC)  09/13/2017  . Toe pain, right 11/17/2016  . Left shoulder pain 01/19/2016  . Hyperlipidemia 05/03/2014  . Disc disorder of lumbar region 12/30/2013  . Obesity (BMI 30-39.9) 07/01/2013  . Biliary dyskinesia 12/10/2011  . UTI (urinary tract infection) 07/24/2011  . Lumbar back pain 06/12/2011  . Symptomatic Parkinson disease (HCC) 02/26/2011  . Depression 09/28/2010  . Pre-operative cardiovascular examination 07/23/2010  . CAROTID BRUIT, RIGHT 06/29/2009  . CHEST PAIN-UNSPECIFIED 01/13/2009  . SKIN RASH 10/11/2008  . EDEMA 08/25/2008  . OTHER URINARY INCONTINENCE 02/15/2008  . CHRONIC PAIN SYNDROME 12/31/2007  . BRUISE 09/14/2007  . SCOLIOSIS, LUMBAR SPINE 06/15/2007  . BACK PAIN, THORACIC REGION, RIGHT 06/11/2007  . MENISCUS TEAR 2020/10/1906  . OTHER SYMPTOMS REFERABLE TO LOWER LEG JOINT 02/17/2007  . MYALGIA 02/10/2007  . CALF PAIN, RIGHT 02/10/2007  . DEPRESSION 01/01/2007  . HYPOKALEMIA, HX OF 01/01/2007  . TINEA CORPORIS 09/22/2006  . ALLERGIC DRUG REACTION, OPIOIDS 09/22/2006  . Essential hypertension 08/20/2006  . OSTEOARTHRITIS 08/20/2006    Past Surgical History:  Procedure Laterality Date  . ABDOMINAL HYSTERECTOMY    . BREAST LUMPECTOMY    . KNEE ARTHROSCOPY     Bilateral  . LUMBAR LAMINECTOMY/DECOMPRESSION MICRODISCECTOMY  2007   L3-4,4-5, L5hemilaminectomy with leopard cage internal fiaxation L3-5  . ROTATOR CUFF REPAIR  Right     OB History   None      Home Medications    Prior to Admission medications   Medication Sig Start Date End Date Taking? Authorizing Provider  amantadine (SYMMETREL) 100 MG capsule Take 1 capsule (100 mg total) by mouth 2 (two) times daily. Patient taking differently: Take 100 mg by mouth at bedtime.  07/01/13  Yes Lowne Chase, Yvonne R, DO  amLODipine-valsartan (EXFORGE) 10-320 MG tablet TAKE 1 TABLET BY MOUTH EVERY DAY 11/13/17  Yes Seabron Spates R, DO  carbidopa-levodopa (SINEMET) 25-100 MG per tablet Take 4 tablets  by mouth 4 (four) times daily.  07/14/10  Yes [provider]  desoximetasone (TOPICORT) 0.05 % cream APPLY TO AFFECTED AREA TWICE A DAY Patient taking differently: Apply 1 application topically 2 (two) times daily.  01/16/16  Yes Seabron Spates R, DO  famotidine (PEPCID) 20 MG tablet Take 20 mg by mouth 2 (two) times daily.   Yes [provider]  FLUoxetine (PROZAC) 40 MG capsule TAKE 1 CAPSULE BY MOUTH EVERY DAY 09/25/17  Yes Seabron Spates R, DO  levofloxacin (LEVAQUIN) 500 MG tablet Take 1 tablet (500 mg total) by mouth daily. 01/16/18  Yes Zola Button, Yvonne R, DO  OLANZapine (ZYPREXA) 10 MG tablet TAKE 1 TABLET BY MOUTH EVERY DAY 09/25/17  Yes Seabron Spates R, DO  pantoprazole (PROTONIX) 40 MG tablet Take 1 tablet (40 mg total) by mouth 2 (two) times daily before a meal. 12/23/17  Yes Zola Button, Yvonne R, DO  pravastatin (PRAVACHOL) 20 MG tablet TAKE 1 TABLET BY MOUTH ONCE DAILY 11/04/17  Yes Seabron Spates R, DO  primidone (MYSOLINE) 50 MG tablet Take 100 mg by mouth at bedtime.  01/14/16  Yes [provider]  Vitamin D, Ergocalciferol, (DRISDOL) 50000 units CAPS capsule Take 1 capsule (50,000 Units total) by mouth every 7 (seven) days. 12/31/17  Yes Seabron Spates R, DO  azithromycin (ZITHROMAX) 250 MG tablet Take 1 tablet (250 mg total) by mouth daily. Patient not taking: Reported on 01/19/2018 01/01/18   Calvert Cantor, MD  cefpodoxime (VANTIN) 200 MG tablet Take 1 tablet (200 mg total) by mouth 2 (two) times daily. Patient not taking: Reported on 01/19/2018 01/01/18   Calvert Cantor, MD  Cholecalciferol (VITAMIN D-3) 1000 units CAPS Take 1 capsule (1,000 Units total) by mouth daily. Patient not taking: Reported on 01/19/2018 12/31/17   Seabron Spates R, DO  cyanocobalamin (,VITAMIN B-12,) 1000 MCG/ML injection Inject 1ml subq weekly for 4 weeks.  (then once a month, will send in another rx for monthly) Patient not taking: Reported on  01/19/2018 12/31/17   Seabron Spates R, DO  cyanocobalamin (,VITAMIN B-12,) 1000 MCG/ML injection Inject 1ml subq monthly. (please make sure fill the weekly rx first) Patient not taking: Reported on 01/19/2018 12/31/17   Donato Schultz, DO    Family History Family History  Problem Relation Age of Onset  . Stroke Mother   . Parkinsonism Mother     Social History Social History   Tobacco Use  . Smoking status: Never Smoker  . Smokeless tobacco: Never Used  Substance Use Topics  . Alcohol use: No  . Drug use: No     Allergies   Codeine; Gabapentin; Latex; Morphine sulfate; Sertraline hcl; and Penicillins   Review of Systems Review of Systems  Constitutional: Negative for appetite change and fatigue.  HENT: Negative for congestion, ear discharge and sinus pressure.  Eyes: Negative for discharge.  Respiratory: Negative for cough and shortness of breath.   Cardiovascular: Negative for chest pain.  Gastrointestinal: Negative for abdominal pain and diarrhea.  Genitourinary: Negative for frequency and hematuria.  Musculoskeletal: Negative for back pain.  Skin: Negative for rash.  Neurological: Positive for weakness. Negative for focal weakness, seizures and headaches.  Psychiatric/Behavioral: Negative for hallucinations.     Physical Exam Updated Vital Signs BP (!) 208/112 (BP Location: Right Arm)   Pulse 78   Temp 98.2 F (36.8 C) (Oral)   Resp 18   Ht 5\' 6"  (1.676 m)   Wt 113.4 kg   SpO2 96%   BMI 40.35 kg/m   Physical Exam  Constitutional: She is oriented to person, place, and time. She appears well-developed.  HENT:  Head: Normocephalic.  Mucous membranes dry  Eyes: Conjunctivae and EOM are normal. No scleral icterus.  Neck: Neck supple. No thyromegaly present.  Cardiovascular: Normal rate and regular rhythm. Exam reveals no gallop and no friction rub.  No murmur heard. Pulmonary/Chest: No stridor. She has no wheezes. She has no rales. She  exhibits no tenderness.  Abdominal: She exhibits no distension. There is no tenderness. There is no rebound.  Musculoskeletal: Normal range of motion. She exhibits no edema.  Lymphadenopathy:    She has no cervical adenopathy.  Neurological: She is oriented to person, place, and time. She exhibits normal muscle tone. Coordination normal.  Skin: No rash noted. No erythema.  Psychiatric: She has a normal mood and affect. Her behavior is normal.     ED Treatments / Results  Labs (all labs ordered are listed, but only abnormal results are displayed) Labs Reviewed  CBC WITH DIFFERENTIAL/PLATELET - Abnormal; Notable for the following components:      Result Value   Hemoglobin 11.1 (*)    HCT 35.6 (*)    RDW 17.8 (*)    All other components within normal limits  COMPREHENSIVE METABOLIC PANEL - Abnormal; Notable for the following components:   Creatinine, Ser 1.30 (*)    AST 8 (*)    GFR calc non Af Amer 37 (*)    GFR calc Af Amer 43 (*)    All other components within normal limits  URINALYSIS, ROUTINE W REFLEX MICROSCOPIC    EKG None  Radiology Dg Chest 2 View  Result Date: 01/19/2018 CLINICAL DATA:  Pneumonia 2 weeks ago. EXAM: CHEST - 2 VIEW COMPARISON:  01/14/2018. FINDINGS: Low lung volumes. The lungs are clear without focal pneumonia, edema, pneumothorax or pleural effusion. Right middle lobe atelectasis or scarring evident. The cardiopericardial silhouette is within normal limits for size. The visualized bony structures of the thorax are intact. Telemetry leads overlie the chest. IMPRESSION: No active cardiopulmonary disease. Electronically Signed   By: Kennith Center M.D.   On: 01/19/2018 20:03    Procedures Procedures (including critical care time)  Medications Ordered in ED Medications  sodium chloride 0.9 % bolus 1,000 mL (1,000 mLs Intravenous New Bag/Given 01/19/18 2130)  hydrALAZINE (APRESOLINE) injection 5 mg (has no administration in time range)  feeding supplement  (ENSURE ENLIVE) (ENSURE ENLIVE) liquid 237 mL (has no administration in time range)     Initial Impression / Assessment and Plan / ED Course  I have reviewed the triage vital signs and the nursing notes.  Pertinent labs & imaging results that were available during my care of the patient were reviewed by me and considered in my medical decision making (see chart for details).  Patient with moderate AKI and poorly controlled blood pressure.  She will be admitted to medicine and hydrated and her blood pressure will be monitored  Final Clinical Impressions(s) / ED Diagnoses   Final diagnoses:  AKI (acute kidney injury) [facility])    ED Discharge Orders    None       Bethann Berkshire, MD 01/19/18 2216

## 2018-01-19 NOTE — Telephone Encounter (Signed)
Daughter is concerned about waiting 2-3 weeks since she does not have much medication left and the pneumonia lingering.  Her mom is getting weaker.   Verbal orders given

## 2018-01-19 NOTE — ED Notes (Signed)
Bed: WA05 Expected date:  Expected time:  Means of arrival:  Comments: EMS-SOB 

## 2018-01-19 NOTE — Telephone Encounter (Signed)
Daughter notified 

## 2018-01-19 NOTE — ED Notes (Signed)
Patient transported to CT 

## 2018-01-19 NOTE — H&P (Signed)
History and Physical    [name] ZOX:[id number] DOB: 08-11-1933 DOA: 01/19/2018  PCP: Donato Schultz, DO  Patient coming from: Home.  Chief Complaint: Weakness.  HPI: [name] is a 84 y.o. female with history of Parkinson's disease, hypertension, anemia who was recently admitted for weakness with pneumonia discharged to rehab and eventually patient was discharged from rehab last week has been at home for last 4 to 5 days has been feeling increasingly weak and poor appetite.  Denies any abdominal pain vomiting or diarrhea.  Since patient was feeling weak and short of breath patient's daughter has insisted a repeat chest x-ray by patient's primary care physician which was done on October 31 which showed persistent infiltrate and was started on Levaquin again on November 1.  Despite taking which patient has become more weak and having poor appetite and was brought to the ER.  ED Course: In the ER patient is not in distress chest x-ray does not show any acute EKG shows normal sinus rhythm with frequent PVCs and QTC around 515 ms.  Creatinine has increased from baseline and was given 1 L normal 7 bolus initially patient also had elevated blood pressure which improved without intervention.  Given the generalized picture of weakness poor appetite good to have admitted for further hydration and monitoring.  Review of Systems: As per HPI, rest all negative.   Past Medical History:  Diagnosis Date  . Allergic drug reaction    Opioids  . 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 Back pain, thoracic   . Benign familial tremor   . Bruise   . Calf pain   . Chest pain    -- Myoview negative 2007  . Chronic pain syndrome   . Depression   . Edema   . Gallstones   . Hypertension   . Hypokalemia   . Meniscus tear   . Myalgia   . Osteoarthritis   . Other symptoms referable to lower leg joint   . Other urinary incontinence   . Scoliosis     . Skin rash   . Tinea corporis     Past Surgical History:  Procedure Laterality Date  . ABDOMINAL HYSTERECTOMY    . BREAST LUMPECTOMY    . KNEE ARTHROSCOPY     Bilateral  . LUMBAR LAMINECTOMY/DECOMPRESSION MICRODISCECTOMY  2007   L3-4,4-5, L5hemilaminectomy with leopard cage internal fiaxation L3-5  . ROTATOR CUFF REPAIR     Right     reports that she has never smoked. She has never used smokeless tobacco. She reports that she does not drink alcohol or use drugs.  Allergies  Allergen Reactions  . Codeine Nausea And Vomiting  . Gabapentin Swelling    Caused Bilateral feet swelling  . Latex Other (See Comments)    Burns the skin  . Morphine Sulfate Nausea And Vomiting  . Sertraline Hcl     Unknown reaction  . Penicillins Other (See Comments)    Has patient had a PCN reaction causing immediate rash, facial/tongue/throat swelling, SOB or lightheadedness with hypotension: Unknown Has patient had a PCN reaction causing severe rash involving mucus membranes or skin necrosis: Unknown Has patient had a PCN reaction that required hospitalization: Unknown Has patient had a PCN reaction occurring within the last 10 years: Unknown If all of the above answers are "NO", then may proceed with Cephalosporin use.     Family History  Problem Relation Age of Onset  .  Stroke Mother   . Parkinsonism Mother     Prior to Admission medications   Medication Sig Start Date End Date Taking? Authorizing Provider  amantadine (SYMMETREL) 100 MG capsule Take 1 capsule (100 mg total) by mouth 2 (two) times daily. Patient taking differently: Take 100 mg by mouth at bedtime.  07/01/13  Yes Lowne Chase, Yvonne R, DO  amLODipine-valsartan (EXFORGE) 10-320 MG tablet TAKE 1 TABLET BY MOUTH EVERY DAY 11/13/17  Yes Seabron Spates R, DO  carbidopa-levodopa (SINEMET) 25-100 MG per tablet Take 4 tablets by mouth 4 (four) times daily.  07/14/10  Yes [provider]  desoximetasone (TOPICORT) 0.05 %  cream APPLY TO AFFECTED AREA TWICE A DAY Patient taking differently: Apply 1 application topically 2 (two) times daily.  01/16/16  Yes Seabron Spates R, DO  famotidine (PEPCID) 20 MG tablet Take 20 mg by mouth 2 (two) times daily.   Yes [provider]  FLUoxetine (PROZAC) 40 MG capsule TAKE 1 CAPSULE BY MOUTH EVERY DAY 09/25/17  Yes Seabron Spates R, DO  levofloxacin (LEVAQUIN) 500 MG tablet Take 1 tablet (500 mg total) by mouth daily. 01/16/18  Yes Zola Button, Yvonne R, DO  OLANZapine (ZYPREXA) 10 MG tablet TAKE 1 TABLET BY MOUTH EVERY DAY 09/25/17  Yes Seabron Spates R, DO  pantoprazole (PROTONIX) 40 MG tablet Take 1 tablet (40 mg total) by mouth 2 (two) times daily before a meal. 12/23/17  Yes Zola Button, Yvonne R, DO  pravastatin (PRAVACHOL) 20 MG tablet TAKE 1 TABLET BY MOUTH ONCE DAILY 11/04/17  Yes Seabron Spates R, DO  primidone (MYSOLINE) 50 MG tablet Take 100 mg by mouth at bedtime.  01/14/16  Yes [provider]  Vitamin D, Ergocalciferol, (DRISDOL) 50000 units CAPS capsule Take 1 capsule (50,000 Units total) by mouth every 7 (seven) days. 12/31/17  Yes Seabron Spates R, DO  azithromycin (ZITHROMAX) 250 MG tablet Take 1 tablet (250 mg total) by mouth daily. Patient not taking: Reported on 01/19/2018 01/01/18   Calvert Cantor, MD  cefpodoxime (VANTIN) 200 MG tablet Take 1 tablet (200 mg total) by mouth 2 (two) times daily. Patient not taking: Reported on 01/19/2018 01/01/18   Calvert Cantor, MD  Cholecalciferol (VITAMIN D-3) 1000 units CAPS Take 1 capsule (1,000 Units total) by mouth daily. Patient not taking: Reported on 01/19/2018 12/31/17   Seabron Spates R, DO  cyanocobalamin (,VITAMIN B-12,) 1000 MCG/ML injection Inject 1ml subq weekly for 4 weeks.  (then once a month, will send in another rx for monthly) Patient not taking: Reported on 01/19/2018 12/31/17   Seabron Spates R, DO  cyanocobalamin (,VITAMIN B-12,) 1000 MCG/ML injection Inject  1ml subq monthly. (please make sure fill the weekly rx first) Patient not taking: Reported on 01/19/2018 12/31/17   Donato Schultz, DO    Physical Exam: Vitals:   01/19/18 2105 01/19/18 2130 01/19/18 2200 01/19/18 2209  BP: (!) 208/112 (!) 205/107 (!) 140/91 (!) 157/80  Pulse: 78 80  (!) 43  Resp: 18 17 19 19   Temp:      TempSrc:      SpO2: 96% 93%  92%  Weight:      Height:          Constitutional: Moderately built and nourished. Vitals:   01/19/18 2105 01/19/18 2130 01/19/18 2200 01/19/18 2209  BP: (!) 208/112 (!) 205/107 (!) 140/91 (!) 157/80  Pulse: 78 80  (!) 43  Resp: 18 17 19  19  Temp:      TempSrc:      SpO2: 96% 93%  92%  Weight:      Height:       Eyes: Anicteric no pallor. ENMT: No discharge from the ears eyes nose or mouth. Neck: No mass felt.  No neck rigidity but no JVD appreciated. Respiratory: No rhonchi or crepitations. Cardiovascular: S1-S2 heard no murmurs appreciated. Abdomen: Soft nontender bowel sounds present. Musculoskeletal: No edema.  No joint effusion. Skin: No rash. Neurologic: Alert awake oriented to time place and person.  Moves all extremities.  Has tremors. Psychiatric: Appears normal per normal affect.   Labs on Admission: I have personally reviewed following labs and imaging studies  CBC: Recent Labs  Lab 01/19/18 2010  WBC 6.8  NEUTROABS 4.1  HGB 11.1*  HCT 35.6*  MCV 88.8  PLT 209   Basic Metabolic Panel: Recent Labs  Lab 01/19/18 2010  NA 141  K 3.5  CL 103  CO2 25  GLUCOSE 94  BUN 18  CREATININE 1.30*  CALCIUM 8.9   GFR: Estimated Creatinine Clearance: 43.3 mL/min (A) (by C-G formula based on SCr of 1.3 mg/dL (H)). Liver Function Tests: Recent Labs  Lab 01/19/18 2010  AST 8*  ALT <5  ALKPHOS 89  BILITOT 0.9  PROT 6.8  ALBUMIN 3.7   No results for input(s): LIPASE, AMYLASE in the last 168 hours. No results for input(s): AMMONIA in the last 168 hours. Coagulation Profile: No results for  input(s): INR, PROTIME in the last 168 hours. Cardiac Enzymes: No results for input(s): CKTOTAL, CKMB, CKMBINDEX, TROPONINI in the last 168 hours. BNP (last 3 results) No results for input(s): PROBNP in the last 8760 hours. HbA1C: No results for input(s): HGBA1C in the last 72 hours. CBG: No results for input(s): GLUCAP in the last 168 hours. Lipid Profile: No results for input(s): CHOL, HDL, LDLCALC, TRIG, CHOLHDL, LDLDIRECT in the last 72 hours. Thyroid Function Tests: No results for input(s): TSH, T4TOTAL, FREET4, T3FREE, THYROIDAB in the last 72 hours. Anemia Panel: No results for input(s): VITAMINB12, FOLATE, FERRITIN, TIBC, IRON, RETICCTPCT in the last 72 hours. Urine analysis:    Component Value Date/Time   COLORURINE YELLOW 12/28/2017 1135   APPEARANCEUR CLEAR 12/28/2017 1135   LABSPEC 1.015 12/28/2017 1135   PHURINE 7.0 12/28/2017 1135   GLUCOSEU NEGATIVE 12/28/2017 1135   HGBUR NEGATIVE 12/28/2017 1135   HGBUR negative 02/15/2008 1302   BILIRUBINUR NEGATIVE 12/28/2017 1135   BILIRUBINUR neg 08/18/2015 1459   KETONESUR 5 (A) 12/28/2017 1135   PROTEINUR NEGATIVE 12/28/2017 1135   UROBILINOGEN 0.2 08/18/2015 1459   UROBILINOGEN 0.2 08/02/2010 2217   NITRITE NEGATIVE 12/28/2017 1135   LEUKOCYTESUR TRACE (A) 12/28/2017 1135   Sepsis Labs: @LABRCNTIP (procalcitonin:4,lacticidven:4) )No results found for this or any previous visit (from the past 240 hour(s)).   Radiological Exams on Admission: Dg Chest 2 View  Result Date: 01/19/2018 CLINICAL DATA:  Pneumonia 2 weeks ago. EXAM: CHEST - 2 VIEW COMPARISON:  01/14/2018. FINDINGS: Low lung volumes. The lungs are clear without focal pneumonia, edema, pneumothorax or pleural effusion. Right middle lobe atelectasis or scarring evident. The cardiopericardial silhouette is within normal limits for size. The visualized bony structures of the thorax are intact. Telemetry leads overlie the chest. IMPRESSION: No active cardiopulmonary  disease. Electronically Signed   By: Kennith Center M.D.   On: 01/19/2018 20:03    EKG: Independently reviewed.  Normal sinus rhythm with frequent PVCs QTC is 550 ms QRS is 111  ms.  Assessment/Plan Principal Problem:   Generalized weakness Active Problems:   Symptomatic Parkinson disease (HCC)   Prolonged QT interval   Vitamin B 12 deficiency   Hypertensive urgency   ARF (acute renal failure) (HCC)   Weakness generalized    1. Generalized weakness -cause not clear.  Has been having poor appetite and increasing difficulty to walk with no definite focal deficits.  Patient at times gets short of breath also.  Will check CT head CT chest troponin TSH CK levels d-dimer anemia panel.  Gently hydrate for now given the mild increase in creatinine.  Physical therapy consult. 2. Acute renal failure likely from poor appetite -dehydration.  Patient received 1 L fluid bolus in the ER UA is pending.  Abdomen appears benign.  Follow metabolic panel closely.  May have to hold ARB if creatinine does not improve. 3. Poor appetite cause not clear.  May need further work-up the patient's appetite does not improve. 4. Hypertensive urgency -patient takes Exforge.  Creatinine is mildly increased.  ARB part may have to be held if creatinine does not improve.  For now I have kept patient on PRN IV hydralazine.  Closely follow blood pressure trends. 5. Hyperlipidemia on statin check CK levels. 6. Parkinson's disease on Sinemet primidone and amantadine. 7. Prolonged QTC -try to avoid medications which can prolong QTC. 8. Depression  -on fluoxetine and Zyprexa. 9. Anemia appears to be chronic.  Is on B12 supplements.  Since patient has increasing weakness will check anemia panel.   DVT prophylaxis: Lovenox. Code Status: Full code. Family Communication: Patient's daughters. Disposition Plan: To be determined. Consults called: Physical therapy. Admission status: Observation.   Eduard Clos MD [facility] Pager [phone].  If 7PM-7AM, please contact night-coverage [email or web address] Password [facility]  01/19/2018, 10:54 PM

## 2018-01-19 NOTE — Telephone Encounter (Signed)
Daughter stated that mother is in a lot of pain.

## 2018-01-19 NOTE — Telephone Encounter (Signed)
Daughter will take to ER tonight or she may bring in tomorrow.

## 2018-01-19 NOTE — ED Notes (Signed)
Placed patient on 2L [place].

## 2018-01-19 NOTE — Telephone Encounter (Signed)
Can she bring her in?

## 2018-01-19 NOTE — ED Notes (Signed)
ED TO INPATIENT HANDOFF REPORT  Name/Age/Gender [name] 84 y.o. female  Code Status    Code Status Orders  (From admission, onward)         Start     Ordered   01/19/18 2252  Full code  Continuous     01/19/18 2253        Code Status History    Date Active Date Inactive Code Status Order ID Comments User Context   12/28/2017 1830 01/02/2018 1427 Full Code [id number]  Elodia Florence., MD Inpatient    Advance Directive Documentation     Most Recent Value  Type of Advance Directive  Living will  Pre-existing out of facility DNR order (yellow form or pink MOST form)  -  "MOST" Form in Place?  -      Home/SNF/Other Home  Chief Complaint sob  Level of Care/Admitting Diagnosis ED Disposition    ED Disposition Condition [place]: [place] [[id number]]  Level of Care: Telemetry [5]  Admit to tele based on following criteria: Monitor QTC interval  Diagnosis: Weakness generalized [[id number]]  Admitting Physician: Rise Patience [phone]  Attending Physician: Rise Patience [[id number]]  PT Class (Do Not Modify): Observation [104]  PT Acc Code (Do Not Modify): Observation [10022]       Medical History Past Medical History:  Diagnosis Date  . Allergic drug reaction    Opioids  . 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 Back pain, thoracic   . Benign familial tremor   . Bruise   . Calf pain   . Chest pain    -- Myoview negative 2007  . Chronic pain syndrome   . Depression   . Edema   . Gallstones   . Hypertension   . Hypokalemia   . Meniscus tear   . Myalgia   . Osteoarthritis   . Other symptoms referable to lower leg joint   . Other urinary incontinence   . Scoliosis   . Skin rash   . Tinea corporis     Allergies Allergies  Allergen Reactions  . Codeine Nausea And Vomiting  . Gabapentin Swelling    Caused Bilateral feet swelling  . Latex Other  (See Comments)    Burns the skin  . Morphine Sulfate Nausea And Vomiting  . Sertraline Hcl     Unknown reaction  . Penicillins Other (See Comments)    Has patient had a PCN reaction causing immediate rash, facial/tongue/throat swelling, SOB or lightheadedness with hypotension: Unknown Has patient had a PCN reaction causing severe rash involving mucus membranes or skin necrosis: Unknown Has patient had a PCN reaction that required hospitalization: Unknown Has patient had a PCN reaction occurring within the last 10 years: Unknown If all of the above answers are "NO", then may proceed with Cephalosporin use.     IV Location/Drains/Wounds Patient Lines/Drains/Airways Status   Active Line/Drains/Airways    Name:   Placement date:   Placement time:   Site:   Days:   Peripheral IV 12/30/17 Left;Anterior Forearm   12/30/17    0041    Forearm   20   Peripheral IV 01/19/18 Left Antecubital   01/19/18    1851    Antecubital   less than 1   External Urinary Catheter   12/29/17    1017    -   21   External Urinary Catheter  01/19/18    2005    -   less than 1          Labs/Imaging Results for orders placed or performed during the hospital encounter of 01/19/18 (from the past 48 hour(s))  CBC with Differential/Platelet     Status: Abnormal   Collection Time: 01/19/18  8:10 PM  Result Value Ref Range   WBC 6.8 4.0 - 10.5 K/uL   RBC 4.01 3.87 - 5.11 MIL/uL   Hemoglobin 11.1 (L) 12.0 - 15.0 g/dL   HCT 35.6 (L) 36.0 - 46.0 %   MCV 88.8 80.0 - 100.0 fL   MCH 27.7 26.0 - 34.0 pg   MCHC 31.2 30.0 - 36.0 g/dL   RDW 17.8 (H) 11.5 - 15.5 %   Platelets 209 150 - 400 K/uL   nRBC 0.0 0.0 - 0.2 %   Neutrophils Relative % 60 %   Neutro Abs 4.1 1.7 - 7.7 K/uL   Lymphocytes Relative 22 %   Lymphs Abs 1.5 0.7 - 4.0 K/uL   Monocytes Relative 15 %   Monocytes Absolute 1.0 0.1 - 1.0 K/uL   Eosinophils Relative 3 %   Eosinophils Absolute 0.2 0.0 - 0.5 K/uL   Basophils Relative 0 %   Basophils  Absolute 0.0 0.0 - 0.1 K/uL   Immature Granulocytes 0 %   Abs Immature Granulocytes 0.02 0.00 - 0.07 K/uL    Comment: Performed at [facility], [place] [address], [place], [place] [address]  Comprehensive metabolic panel     Status: Abnormal   Collection Time: 01/19/18  8:10 PM  Result Value Ref Range   Sodium 141 135 - 145 mmol/L   Potassium 3.5 3.5 - 5.1 mmol/L   Chloride 103 98 - 111 mmol/L   CO2 25 22 - 32 mmol/L   Glucose, Bld 94 70 - 99 mg/dL   BUN 18 8 - 23 mg/dL   Creatinine, Ser 1.30 (H) 0.44 - 1.00 mg/dL   Calcium 8.9 8.9 - 10.3 mg/dL   Total Protein 6.8 6.5 - 8.1 g/dL   Albumin 3.7 3.5 - 5.0 g/dL   AST 8 (L) 15 - 41 U/L   ALT <5 0 - 44 U/L   Alkaline Phosphatase 89 38 - 126 U/L   Total Bilirubin 0.9 0.3 - 1.2 mg/dL   GFR calc non Af Amer 37 (L) >60 mL/min   GFR calc Af Amer 43 (L) >60 mL/min    Comment: (NOTE) The eGFR has been calculated using the CKD EPI equation. This calculation has not been validated in all clinical situations. eGFR's persistently <60 mL/min signify possible Chronic Kidney Disease.    Anion gap 13 5 - 15    Comment: Performed at [facility], [place] [address], [place], [place] [address]   Dg Chest 2 View  Result Date: 01/19/2018 CLINICAL DATA:  Pneumonia 2 weeks ago. EXAM: CHEST - 2 VIEW COMPARISON:  01/14/2018. FINDINGS: Low lung volumes. The lungs are clear without focal pneumonia, edema, pneumothorax or pleural effusion. Right middle lobe atelectasis or scarring evident. The cardiopericardial silhouette is within normal limits for size. The visualized bony structures of the thorax are intact. Telemetry leads overlie the chest. IMPRESSION: No active cardiopulmonary disease. Electronically Signed   By: Misty Stanley M.D.   On: 01/19/2018 20:03   None  Pending Labs Unresulted Labs (From admission, onward)    Start     Ordered   01/26/18 0500  Creatinine, serum  (enoxaparin (LOVENOX)  CrCl >/= 30 ml/min)   Weekly,   R    Comments:  while on enoxaparin therapy    01/19/18 2253   01/20/18 2010  Basic metabolic panel  Tomorrow morning,   R     01/19/18 2253   01/20/18 0500  CBC  Tomorrow morning,   R     01/19/18 2253   01/19/18 2258  Reticulocytes  (Anemia Panel (PNL))  Add-on,   R     01/19/18 2257   01/19/18 2256  Vitamin B12  (Anemia Panel (PNL))  Once,   R     01/19/18 2255   01/19/18 2256  Folate  (Anemia Panel (PNL))  Once,   R     01/19/18 2255   01/19/18 2256  Iron and TIBC  (Anemia Panel (PNL))  Once,   R     01/19/18 2255   01/19/18 2256  Ferritin  (Anemia Panel (PNL))  Once,   R     01/19/18 2255   01/19/18 2254  CK  Once,   R     01/19/18 2253   01/19/18 2253  Magnesium  Once,   R     01/19/18 2253   01/19/18 2253  TSH  Once,   R     01/19/18 2253   01/19/18 2253  Troponin I  Now then every 6 hours,   R     01/19/18 2253   01/19/18 2251  CBC  (enoxaparin (LOVENOX)    CrCl >/= 30 ml/min)  Once,   R    Comments:  Baseline for enoxaparin therapy IF NOT ALREADY DRAWN.  Notify MD if PLT < 100 K.    01/19/18 2253   01/19/18 2251  Creatinine, serum  (enoxaparin (LOVENOX)    CrCl >/= 30 ml/min)  Once,   R    Comments:  Baseline for enoxaparin therapy IF NOT ALREADY DRAWN.    01/19/18 2253   01/19/18 1940  Urinalysis, Routine w reflex microscopic  Once,   R     01/19/18 1940          Vitals/Pain Today's Vitals   01/19/18 2200 01/19/18 2209 01/19/18 2230 01/19/18 2327  BP: (!) 140/91 (!) 157/80 140/85 (!) 177/80  Pulse:  (!) 43  84  Resp: '19 19 17 ' (!) 21  Temp:      TempSrc:      SpO2:  92%  94%  Weight:      Height:      PainSc:  0-No pain      Isolation Precautions No active isolations  Medications Medications  feeding supplement (ENSURE ENLIVE) (ENSURE ENLIVE) liquid 237 mL (has no administration in time range)  amLODipine-valsartan (EXFORGE) 10-320 MG per tablet 1 tablet (has no administration in time range)  pravastatin (PRAVACHOL) tablet 20 mg (has no  administration in time range)  FLUoxetine (PROZAC) capsule 40 mg (has no administration in time range)  OLANZapine (ZYPREXA) tablet 10 mg (has no administration in time range)  famotidine (PEPCID) tablet 20 mg (20 mg Oral Given 01/19/18 2325)  pantoprazole (PROTONIX) EC tablet 40 mg (has no administration in time range)  amantadine (SYMMETREL) capsule 100 mg (100 mg Oral Given 01/19/18 2325)  carbidopa-levodopa (SINEMET IR) 25-100 MG per tablet immediate release 4 tablet (has no administration in time range)  primidone (MYSOLINE) tablet 100 mg (has no administration in time range)  Vitamin D (Ergocalciferol) (DRISDOL) capsule 50,000 Units (has no administration in time range)  acetaminophen (TYLENOL) tablet 650 mg (has  no administration in time range)    Or  acetaminophen (TYLENOL) suppository 650 mg (has no administration in time range)  enoxaparin (LOVENOX) injection 40 mg (has no administration in time range)  0.9 %  sodium chloride infusion (has no administration in time range)  hydrALAZINE (APRESOLINE) injection 5 mg (has no administration in time range)  sodium chloride 0.9 % bolus 1,000 mL (0 mLs Intravenous Stopped 01/19/18 2319)    Mobility non-ambulatory

## 2018-01-19 NOTE — Telephone Encounter (Signed)
Can she bring her in tomorrow ? If she is really weak she may need to go to ER

## 2018-01-19 NOTE — ED Triage Notes (Signed)
Patient arrived by EMS from home. Pt had pneumonia for two weeks. Pt has had antibiotics and X-ray previously per EMS. Pt c/o of SOB and neuropathy in hands and feet. Pt doesn't appear SOB per EMS.  Pt has hx of Parkinson's Disease.   BP 133/83, HR 86, SpO2 98% on room air, RR 18.

## 2018-01-19 NOTE — Telephone Encounter (Signed)
Copied from CRM [id number]. Topic: Quick Communication - See Telephone Encounter >> Jan 19, 2018  1:46 PM Luanna Cole wrote: CRM for notification. See Telephone encounter for: 01/19/18. Pt daughter called and stated that patient would like pain medication for hands and feet. Please give pt daughter a call back.

## 2018-01-20 ENCOUNTER — Encounter (HOSPITAL_COMMUNITY): Payer: Self-pay | Admitting: Radiology

## 2018-01-20 ENCOUNTER — Observation Stay (HOSPITAL_COMMUNITY): Payer: Medicare Other

## 2018-01-20 ENCOUNTER — Encounter (HOSPITAL_COMMUNITY): Payer: Medicare Other

## 2018-01-20 ENCOUNTER — Ambulatory Visit (HOSPITAL_COMMUNITY): Payer: Medicare Other

## 2018-01-20 DIAGNOSIS — E669 Obesity, unspecified: Secondary | ICD-10-CM | POA: Diagnosis present

## 2018-01-20 DIAGNOSIS — Z823 Family history of stroke: Secondary | ICD-10-CM | POA: Diagnosis not present

## 2018-01-20 DIAGNOSIS — Z880 Allergy status to penicillin: Secondary | ICD-10-CM | POA: Diagnosis not present

## 2018-01-20 DIAGNOSIS — Z6841 Body Mass Index (BMI) 40.0 and over, adult: Secondary | ICD-10-CM | POA: Diagnosis not present

## 2018-01-20 DIAGNOSIS — G20 Parkinson's disease: Secondary | ICD-10-CM | POA: Diagnosis present

## 2018-01-20 DIAGNOSIS — R9431 Abnormal electrocardiogram [ECG] [EKG]: Secondary | ICD-10-CM

## 2018-01-20 DIAGNOSIS — E538 Deficiency of other specified B group vitamins: Secondary | ICD-10-CM

## 2018-01-20 DIAGNOSIS — Z91040 Latex allergy status: Secondary | ICD-10-CM | POA: Diagnosis not present

## 2018-01-20 DIAGNOSIS — R609 Edema, unspecified: Secondary | ICD-10-CM

## 2018-01-20 DIAGNOSIS — M7989 Other specified soft tissue disorders: Secondary | ICD-10-CM

## 2018-01-20 DIAGNOSIS — F329 Major depressive disorder, single episode, unspecified: Secondary | ICD-10-CM | POA: Diagnosis present

## 2018-01-20 DIAGNOSIS — E440 Moderate protein-calorie malnutrition: Secondary | ICD-10-CM | POA: Diagnosis present

## 2018-01-20 DIAGNOSIS — I160 Hypertensive urgency: Secondary | ICD-10-CM | POA: Diagnosis present

## 2018-01-20 DIAGNOSIS — M1990 Unspecified osteoarthritis, unspecified site: Secondary | ICD-10-CM | POA: Diagnosis present

## 2018-01-20 DIAGNOSIS — Z820 Family history of epilepsy and other diseases of the nervous system: Secondary | ICD-10-CM | POA: Diagnosis not present

## 2018-01-20 DIAGNOSIS — I5030 Unspecified diastolic (congestive) heart failure: Secondary | ICD-10-CM | POA: Diagnosis not present

## 2018-01-20 DIAGNOSIS — Z90710 Acquired absence of both cervix and uterus: Secondary | ICD-10-CM | POA: Diagnosis not present

## 2018-01-20 DIAGNOSIS — I10 Essential (primary) hypertension: Secondary | ICD-10-CM | POA: Diagnosis present

## 2018-01-20 DIAGNOSIS — D638 Anemia in other chronic diseases classified elsewhere: Secondary | ICD-10-CM | POA: Diagnosis present

## 2018-01-20 DIAGNOSIS — Z888 Allergy status to other drugs, medicaments and biological substances status: Secondary | ICD-10-CM | POA: Diagnosis not present

## 2018-01-20 DIAGNOSIS — R531 Weakness: Secondary | ICD-10-CM

## 2018-01-20 DIAGNOSIS — G894 Chronic pain syndrome: Secondary | ICD-10-CM | POA: Diagnosis present

## 2018-01-20 DIAGNOSIS — I2699 Other pulmonary embolism without acute cor pulmonale: Secondary | ICD-10-CM | POA: Diagnosis present

## 2018-01-20 DIAGNOSIS — N179 Acute kidney failure, unspecified: Secondary | ICD-10-CM

## 2018-01-20 DIAGNOSIS — I272 Pulmonary hypertension, unspecified: Secondary | ICD-10-CM | POA: Diagnosis present

## 2018-01-20 DIAGNOSIS — Z79899 Other long term (current) drug therapy: Secondary | ICD-10-CM | POA: Diagnosis not present

## 2018-01-20 DIAGNOSIS — G250 Essential tremor: Secondary | ICD-10-CM | POA: Diagnosis present

## 2018-01-20 DIAGNOSIS — Z885 Allergy status to narcotic agent status: Secondary | ICD-10-CM | POA: Diagnosis not present

## 2018-01-20 LAB — URINALYSIS, ROUTINE W REFLEX MICROSCOPIC
BILIRUBIN URINE: NEGATIVE
Bacteria, UA: NONE SEEN
Glucose, UA: NEGATIVE mg/dL
Ketones, ur: 20 mg/dL — AB
NITRITE: NEGATIVE
PH: 6 (ref 5.0–8.0)
Protein, ur: 100 mg/dL — AB
RBC / HPF: >50 RBC/hpf — ABNORMAL HIGH (ref 0–5)
SPECIFIC GRAVITY, URINE: 1.021 (ref 1.005–1.030)

## 2018-01-20 LAB — RETICULOCYTES
Immature Retic Fract: 4.4 % (ref 2.3–15.9)
RBC.: 3.89 MIL/uL (ref 3.87–5.11)
Retic Count, Absolute: 36.2 10*3/uL (ref 19.0–186.0)
Retic Ct Pct: 0.9 % (ref 0.4–3.1)

## 2018-01-20 LAB — D-DIMER, QUANTITATIVE: D-Dimer, Quant: 11.9 ug/mL-FEU — ABNORMAL HIGH (ref 0.00–0.50)

## 2018-01-20 LAB — CBC
HCT: 34.7 % — ABNORMAL LOW (ref 36.0–46.0)
HCT: 32.4 % — ABNORMAL LOW (ref 36.0–46.0)
Hemoglobin: 10.8 g/dL — ABNORMAL LOW (ref 12.0–15.0)
Hemoglobin: 10.1 g/dL — ABNORMAL LOW (ref 12.0–15.0)
MCH: 27.9 pg (ref 26.0–34.0)
MCH: 28 pg (ref 26.0–34.0)
MCHC: 31.1 g/dL (ref 30.0–36.0)
MCHC: 31.2 g/dL (ref 30.0–36.0)
MCV: 89.7 fL (ref 80.0–100.0)
MCV: 89.8 fL (ref 80.0–100.0)
PLATELETS: 216 10*3/uL (ref 150–400)
PLATELETS: 205 10*3/uL (ref 150–400)
RBC: 3.87 MIL/uL (ref 3.87–5.11)
RBC: 3.61 MIL/uL — ABNORMAL LOW (ref 3.87–5.11)
RDW: 17.8 % — AB (ref 11.5–15.5)
RDW: 17.9 % — AB (ref 11.5–15.5)
WBC: 5.9 10*3/uL (ref 4.0–10.5)
WBC: 5.4 10*3/uL (ref 4.0–10.5)
nRBC: 0 % (ref 0.0–0.2)
nRBC: 0 % (ref 0.0–0.2)

## 2018-01-20 LAB — CREATININE, SERUM
CREATININE: 1.08 mg/dL — AB (ref 0.44–1.00)
GFR calc Af Amer: 54 mL/min — ABNORMAL LOW (ref >60)
GFR calc non Af Amer: 47 mL/min — ABNORMAL LOW (ref >60)

## 2018-01-20 LAB — IRON AND TIBC
Iron: 151 ug/dL (ref 28–170)
SATURATION RATIOS: 50 % — AB (ref 10.4–31.8)
TIBC: 299 ug/dL (ref 250–450)
UIBC: 148 ug/dL

## 2018-01-20 LAB — BASIC METABOLIC PANEL
Anion gap: 10 (ref 5–15)
BUN: 16 mg/dL (ref 8–23)
CALCIUM: 8.3 mg/dL — AB (ref 8.9–10.3)
CO2: 26 mmol/L (ref 22–32)
CREATININE: 1.02 mg/dL — AB (ref 0.44–1.00)
Chloride: 105 mmol/L (ref 98–111)
GFR calc Af Amer: 58 mL/min — ABNORMAL LOW (ref >60)
GFR, EST NON AFRICAN AMERICAN: 50 mL/min — AB (ref >60)
GLUCOSE: 84 mg/dL (ref 70–99)
Potassium: 3 mmol/L — ABNORMAL LOW (ref 3.5–5.1)
Sodium: 141 mmol/L (ref 135–145)

## 2018-01-20 LAB — TROPONIN I
Troponin I: <0.03 ng/mL (ref <0.03)
Troponin I: <0.03 ng/mL (ref <0.03)

## 2018-01-20 LAB — TSH: TSH: 1.888 u[IU]/mL (ref 0.350–4.500)

## 2018-01-20 LAB — FOLATE: FOLATE: 1.4 ng/mL — AB (ref >5.9)

## 2018-01-20 LAB — VITAMIN B12: VITAMIN B 12: 522 pg/mL (ref 180–914)

## 2018-01-20 LAB — BRAIN NATRIURETIC PEPTIDE: B Natriuretic Peptide: 29 pg/mL (ref 0.0–100.0)

## 2018-01-20 LAB — MAGNESIUM: Magnesium: 1.8 mg/dL (ref 1.7–2.4)

## 2018-01-20 LAB — FERRITIN: Ferritin: 81 ng/mL (ref 11–307)

## 2018-01-20 LAB — CK: CK TOTAL: 43 U/L (ref 38–234)

## 2018-01-20 MED ORDER — HEPARIN (PORCINE) IN NACL 100-0.45 UNIT/ML-% IJ SOLN
1250.0000 [IU]/h | INTRAMUSCULAR | Status: AC
  Administered 2018-01-20: 1400 [IU]/h via INTRAVENOUS
  Administered 2018-01-21 – 2018-01-22 (×2): 1250 [IU]/h via INTRAVENOUS
  Filled 2018-01-20 (×3): qty 250

## 2018-01-20 MED ORDER — IOPAMIDOL (ISOVUE-370) INJECTION 76%
100.0000 mL | Freq: Once | INTRAVENOUS | Status: AC | PRN
  Administered 2018-01-20: 100 mL via INTRAVENOUS

## 2018-01-20 MED ORDER — IOPAMIDOL (ISOVUE-370) INJECTION 76%
INTRAVENOUS | Status: AC
  Filled 2018-01-20: qty 100

## 2018-01-20 MED ORDER — HYDROCODONE-ACETAMINOPHEN 5-325 MG PO TABS
1.0000 | ORAL_TABLET | Freq: Four times a day (QID) | ORAL | Status: DC | PRN
  Administered 2018-01-20: 1 via ORAL
  Filled 2018-01-20: qty 1

## 2018-01-20 MED ORDER — FOLIC ACID 1 MG PO TABS
1.0000 mg | ORAL_TABLET | Freq: Every day | ORAL | Status: DC
  Administered 2018-01-20 – 2018-01-22 (×3): 1 mg via ORAL
  Filled 2018-01-20 (×3): qty 1

## 2018-01-20 MED ORDER — SODIUM CHLORIDE 0.9 % IJ SOLN
INTRAMUSCULAR | Status: AC
  Administered 2018-01-20: 16:00:00
  Filled 2018-01-20: qty 50

## 2018-01-20 MED ORDER — ONDANSETRON HCL 4 MG/2ML IJ SOLN
4.0000 mg | Freq: Four times a day (QID) | INTRAMUSCULAR | Status: DC | PRN
  Administered 2018-01-20: 4 mg via INTRAVENOUS
  Filled 2018-01-20: qty 2

## 2018-01-20 MED ORDER — HEPARIN BOLUS VIA INFUSION
3000.0000 [IU] | Freq: Once | INTRAVENOUS | Status: AC
  Administered 2018-01-20: 3000 [IU] via INTRAVENOUS
  Filled 2018-01-20: qty 3000

## 2018-01-20 MED ORDER — AMLODIPINE BESYLATE 10 MG PO TABS
10.0000 mg | ORAL_TABLET | Freq: Every day | ORAL | Status: DC
  Administered 2018-01-20 – 2018-01-22 (×3): 10 mg via ORAL
  Filled 2018-01-20 (×3): qty 1

## 2018-01-20 MED ORDER — POTASSIUM CHLORIDE CRYS ER 20 MEQ PO TBCR
40.0000 meq | EXTENDED_RELEASE_TABLET | Freq: Two times a day (BID) | ORAL | Status: AC
  Administered 2018-01-20 – 2018-01-21 (×3): 40 meq via ORAL
  Filled 2018-01-20 (×3): qty 2

## 2018-01-20 MED ORDER — IRBESARTAN 300 MG PO TABS
300.0000 mg | ORAL_TABLET | Freq: Every day | ORAL | Status: DC
  Administered 2018-01-20: 300 mg via ORAL
  Filled 2018-01-20: qty 1

## 2018-01-20 NOTE — Telephone Encounter (Signed)
Pt. Admitted to ED 11/4.

## 2018-01-20 NOTE — Evaluation (Signed)
Physical Therapy Evaluation Patient Details Name: [name] MRN: [id number] DOB: [birth year] Today's Date: 01/20/2018   History of Present Illness  LABRIA WOS is a 84 y.o. female with history of Parkinson's disease, hypertension, anemia who was recently admitted for weakness with pneumonia, and went to rehab.  Home 3-4 days with increasing weakness and found to have persistent infiltrate.   Clinical Impression  Patient presents with decreased independence with mobility due to weakness, limited activity tolerance with SpO2 down low as 86% on RA and HR up to 123 with ambulation.  Feel she would be high fall risk at home and needs STSNF level rehab again prior to return home.  PT to follow acutely.    Follow Up Recommendations SNF;Supervision/Assistance - 24 hour    Equipment Recommendations  None recommended by PT    Recommendations for Other Services       Precautions / Restrictions Precautions Precautions: Fall Precaution Comments: monitor sats      Mobility  Bed Mobility Overal bed mobility: Needs Assistance Bed Mobility: Supine to Sit     Supine to sit: Min assist;HOB elevated     General bed mobility comments: pulled up with PT help with increased time  Transfers Overall transfer level: Needs assistance Equipment used: Rolling walker (2 wheeled) Transfers: Sit to/from Stand Sit to Stand: Min assist;From elevated surface         General transfer comment: raised bed slightly to simulate bed at home; needed assist for balance/safety  Ambulation/Gait Ambulation/Gait assistance: Min assist Gait Distance (Feet): 30 Feet Assistive device: Rolling walker (2 wheeled) Gait Pattern/deviations: Step-through pattern;Step-to pattern;Decreased stride length;Shuffle;Trunk flexed     General Gait Details: shaky/tremulous throughout and seemingly anxious with weakness evident  Stairs            Wheelchair Mobility    Modified Rankin (Stroke Patients Only)        Balance Overall balance assessment: Needs assistance   Sitting balance-Leahy Scale: Fair     Standing balance support: Bilateral upper extremity supported Standing balance-Leahy Scale: Poor Standing balance comment: requires UE support                             Pertinent Vitals/Pain Pain Assessment: 0-10 Pain Score: 8  Pain Location: tingling in hands and feet Pain Descriptors / Indicators: Tingling Pain Intervention(s): Repositioned;Monitored during session    Home Living Family/patient expects to be discharged to:: Private residence Living Arrangements: Children   Type of Home: House Home Access: Stairs to enter   Secretary/administrator of Steps: 1 Home Layout: One level Home Equipment: Environmental consultant - 2 wheels;Walker - 4 wheels      Prior Function Level of Independence: Independent with assistive device(s)         Comments: was initially getting up on her own but got weaker at home; had HHaide and HHPT     Hand Dominance        Extremity/Trunk Assessment   Upper Extremity Assessment Upper Extremity Assessment: Generalized weakness    Lower Extremity Assessment Lower Extremity Assessment: RLE deficits/detail;LLE deficits/detail RLE Deficits / Details: AROM WFL, strength hip flexion 3/5, knee extension 4-/5 RLE Sensation: decreased light touch LLE Deficits / Details: AROM WFL, strength hip flexion 3/5, knee extension 4-/5 LLE Sensation: decreased light touch       Communication   Communication: No difficulties  Cognition Arousal/Alertness: Awake/alert Behavior During Therapy: Anxious Overall Cognitive Status: No family/caregiver present to determine  baseline cognitive functioning                                 General Comments: SOB and shaky with mobility, admits to weakness and nervousness      General Comments General comments (skin integrity, edema, etc.): SpO2 after supine to sit 87%, up to 91% with PLB, after  ambulation HR 123, SpO2 91% then down to 86% before rising back to 91% with cues for PLB about 1 minute    Exercises     Assessment/Plan    PT Assessment Patient needs continued PT services  PT Problem List Decreased strength;Decreased mobility;Decreased knowledge of precautions;Decreased activity tolerance;Decreased balance;Decreased knowledge of use of DME;Cardiopulmonary status limiting activity       PT Treatment Interventions DME instruction;Therapeutic activities;Therapeutic exercise;Gait training;Patient/family education;Balance training;Functional mobility training    PT Goals (Current goals can be found in the Care Plan section)  Acute Rehab PT Goals Patient Stated Goal: to get stronger PT Goal Formulation: With patient Time For Goal Achievement: 02/03/18 Potential to Achieve Goals: Fair    Frequency Min 2X/week   Barriers to discharge        Co-evaluation               AM-PAC PT "6 Clicks" Daily Activity  Outcome Measure Difficulty turning over in bed (including adjusting bedclothes, sheets and blankets)?: Unable Difficulty moving from lying on back to sitting on the side of the bed? : Unable Difficulty sitting down on and standing up from a chair with arms (e.g., wheelchair, bedside commode, etc,.)?: Unable Help needed moving to and from a bed to chair (including a wheelchair)?: A Little Help needed walking in hospital room?: A Little Help needed climbing 3-5 steps with a railing? : A Lot 6 Click Score: 11    End of Session Equipment Utilized During Treatment: Gait belt Activity Tolerance: Patient limited by fatigue Patient left: in bed;with call bell/phone within reach;with bed alarm set Nurse Communication: Mobility status;Other (comment)(SpO2) PT Visit Diagnosis: Other abnormalities of gait and mobility (R26.89);Muscle weakness (generalized) (M62.81)    Time: 1610-9604 PT Time Calculation (min) (ACUTE ONLY): 27 min   Charges:   PT Evaluation $PT  Eval Moderate Complexity: 1 Mod PT Treatments $Gait Training: 8-22 mins        Sheran Lawless, PT Acute Rehabilitation Services [phone] 01/20/2018   Elray Mcgregor 01/20/2018, 12:21 PM

## 2018-01-20 NOTE — Progress Notes (Addendum)
SATURATION QUALIFICATIONS: (This note is used to comply with regulatory documentation for home oxygen)  Patient Saturations on Room Air at Rest = 91%  Patient Saturations on Room Air while Ambulating = 86%  Patient Saturations on NT Liters of oxygen while Ambulating = NT%  Please briefly explain why patient needs home oxygen:  Desaturation on RA with mobility  Sheran Lawless, [place] [facility] Services [phone] 01/20/2018

## 2018-01-20 NOTE — Progress Notes (Signed)
OT Cancellation Note  Patient Details Name: [name] MRN: [id number] DOB: [birth year]   Cancelled Treatment:    Reason Eval/Treat Not Completed: Medical issues which prohibited therapy.  Awaiting CT angio to r/o DVT. Will likely check back in the am.  Jovie Swanner 01/20/2018, 2:11 PM  [name], OTR/L Acute Rehabilitation Services [phone] [facility] pager [phone] office 01/20/2018

## 2018-01-20 NOTE — Progress Notes (Signed)
ANTICOAGULATION CONSULT NOTE - Initial Consult  Pharmacy Consult for Heparin Indication: pulmonary embolus  Allergies  Allergen Reactions  . Codeine Nausea And Vomiting  . Gabapentin Swelling    Caused Bilateral feet swelling  . Latex Other (See Comments)    Burns the skin  . Morphine Sulfate Nausea And Vomiting  . Sertraline Hcl     Unknown reaction  . Penicillins Other (See Comments)    Has patient had a PCN reaction causing immediate rash, facial/tongue/throat swelling, SOB or lightheadedness with hypotension: Unknown Has patient had a PCN reaction causing severe rash involving mucus membranes or skin necrosis: Unknown Has patient had a PCN reaction that required hospitalization: Unknown Has patient had a PCN reaction occurring within the last 10 years: Unknown If all of the above answers are "NO", then may proceed with Cephalosporin use.     Patient Measurements: Height: 5\' 6"  (167.6 cm) Weight: 250 lb (113.4 kg) IBW/kg (Calculated) : 59.3 Heparin Dosing Weight: 85 kg  Vital Signs: Temp: 98.8 F (37.1 C) (11/05 0528) Temp Source: Oral (11/05 0528) BP: 127/59 (11/05 0528) Pulse Rate: 75 (11/05 0528)  Labs: Recent Labs    01/19/18 2010 01/20/18 0100 01/20/18 0758  HGB 11.1* 10.8* 10.1*  HCT 35.6* 34.7* 32.4*  PLT 209 216 205  CREATININE 1.30* 1.08* 1.02*  CKTOTAL  --  43  --   TROPONINI  --  <0.03 <0.03    Estimated Creatinine Clearance: 55.2 mL/min (A) (by C-G formula based on SCr of 1.02 mg/dL (H)).   Medical History: Past Medical History:  Diagnosis Date  . Allergic drug reaction    Opioids  . 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 Back pain, thoracic   . Benign familial tremor   . Bruise   . Calf pain   . Chest pain    -- Myoview negative 2007  . Chronic pain syndrome   . Depression   . Edema   . Gallstones   . Hypertension   . Hypokalemia   . Meniscus tear   . Myalgia   . Osteoarthritis   .  Other symptoms referable to lower leg joint   . Other urinary incontinence   . Scoliosis   . Skin rash   . Tinea corporis     Medications:   No oral anticoagulation PTA  Upon admission, patient was ordered Lovenox 40mg  sq q24h (last dose administered 01/20/2018 @ 01:53)  Assessment:  84 yr female with recent diagnosis of PNA presents with SOB and neuropathy in hands and feet.  PMH significant for Parkinson's Disease, HTN, and anemia.  CT head on 11/4 = negative for bleed  Dopplers on 11/5 = indeterminate DVT of right popliteal vein; no left DVT  CTAngio on 11/5 = + bilateral pulmonary embolism with evidence of right heart strain  Pharmacy consulted to dose IV heparin  Goal of Therapy:  Heparin level 0.3-0.7 units/ml Monitor platelets by anticoagulation protocol: Yes   Plan:   Using [place] nomogram: give Heparin 3000 unit IV bolus x 1 followed by heparin infusion @ 1400 units/hr  Check heparin level 8 hr after heparin started  Follow heparin level and CBC daily  Reisa Coppola, Joselyn Glassman, PharmD 01/20/2018,4:37 PM

## 2018-01-20 NOTE — Progress Notes (Signed)
*  Preliminary Results* Bilateral lower extremity venous duplex completed.  Right: Findings consistent with age indeterminate deep vein thrombosis involving the right popliteal vein.  No cystic structure found in the popliteal fossa.  Left:There is no evidence of deep vein thrombosis in the lower extremity.No cystic structure found in the popliteal fossa.   Blanch Media 01/20/2018 12:06 PM

## 2018-01-20 NOTE — Progress Notes (Signed)
Patient Name: [name]     ZOX:[id number]    DOB: November 15, 1933    DOA: 01/19/2018 PCP: Donato Schultz, DO   Chief Complaint: Dyspnea      HPI: [name] is an 84 y.o. F with Parkinson's disease, HTN, anemia, and recently admitted for community acquired pneumonia who presents with several days of progressive weakness, now progressed to severe dyspnea on exertion.  She has had no chest pain, hemoptysis, obvious leg swelling.  No personal history of DVT or PE.  No fever, sputum production.  In the emergency room, she had a clear chest x-ray, no fever or leukocytosis.  She her creatinine was slightly elevated from baseline, so she was admitted for observation, IV fluids for presumed dehydration.      ROS: Review of Systems  Constitutional: Positive for malaise/fatigue. Negative for chills and fever.  Respiratory: Positive for shortness of breath. Negative for cough, hemoptysis, sputum production and wheezing.   Cardiovascular: Negative for chest pain, palpitations, orthopnea, claudication, leg swelling and PND.  Gastrointestinal: Negative for abdominal pain, nausea and vomiting.  All other systems reviewed and are negative.         Past Medical History:  Diagnosis Date  . Allergic drug reaction    Opioids  . 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 Back pain, thoracic   . Benign familial tremor   . Bruise   . Calf pain   . Chest pain    -- Myoview negative 2007  . Chronic pain syndrome   . Depression   . Edema   . Gallstones   . Hypertension   . Hypokalemia   . Meniscus tear   . Myalgia   . Osteoarthritis   . Other symptoms referable to lower leg joint   . Other urinary incontinence   . Scoliosis   . Skin rash   . Tinea corporis     Past Surgical History:  Procedure Laterality Date  . ABDOMINAL HYSTERECTOMY    . BREAST LUMPECTOMY    . KNEE ARTHROSCOPY     Bilateral  . LUMBAR LAMINECTOMY/DECOMPRESSION  MICRODISCECTOMY  2007   L3-4,4-5, L5hemilaminectomy with leopard cage internal fiaxation L3-5  . ROTATOR CUFF REPAIR     Right    Social History: Patient lives with her daughter.  The patient walks with a walker.  Nonsmoker.  Allergies  Allergen Reactions  . Codeine Nausea And Vomiting  . Gabapentin Swelling    Caused Bilateral feet swelling  . Latex Other (See Comments)    Burns the skin  . Morphine Sulfate Nausea And Vomiting  . Sertraline Hcl     Unknown reaction  . Penicillins Other (See Comments)    Has patient had a PCN reaction causing immediate rash, facial/tongue/throat swelling, SOB or lightheadedness with hypotension: Unknown Has patient had a PCN reaction causing severe rash involving mucus membranes or skin necrosis: Unknown Has patient had a PCN reaction that required hospitalization: Unknown Has patient had a PCN reaction occurring within the last 10 years: Unknown If all of the above answers are "NO", then may proceed with Cephalosporin use.     Family history: family history includes Parkinsonism in her mother; Stroke in her mother.  Prior to Admission medications   Medication Sig Start Date End Date Taking? Authorizing Provider  amantadine (SYMMETREL) 100 MG capsule Take 1 capsule (100 mg total) by mouth 2 (two) times daily. Patient taking differently: Take 100 mg  by mouth at bedtime.  07/01/13  Yes Lowne Chase, Yvonne R, DO  amLODipine-valsartan (EXFORGE) 10-320 MG tablet TAKE 1 TABLET BY MOUTH EVERY DAY 11/13/17  Yes Seabron Spates R, DO  carbidopa-levodopa (SINEMET) 25-100 MG per tablet Take 4 tablets by mouth 4 (four) times daily.  07/14/10  Yes [provider]  desoximetasone (TOPICORT) 0.05 % cream APPLY TO AFFECTED AREA TWICE A DAY Patient taking differently: Apply 1 application topically 2 (two) times daily.  01/16/16  Yes Seabron Spates R, DO  famotidine (PEPCID) 20 MG tablet Take 20 mg by mouth 2 (two) times daily.   Yes [provider]  FLUoxetine (PROZAC) 40 MG capsule TAKE 1 CAPSULE BY MOUTH EVERY DAY 09/25/17  Yes Seabron Spates R, DO  levofloxacin (LEVAQUIN) 500 MG tablet Take 1 tablet (500 mg total) by mouth daily. 01/16/18  Yes Zola Button, Yvonne R, DO  OLANZapine (ZYPREXA) 10 MG tablet TAKE 1 TABLET BY MOUTH EVERY DAY 09/25/17  Yes Seabron Spates R, DO  pantoprazole (PROTONIX) 40 MG tablet Take 1 tablet (40 mg total) by mouth 2 (two) times daily before a meal. 12/23/17  Yes Zola Button, Yvonne R, DO  pravastatin (PRAVACHOL) 20 MG tablet TAKE 1 TABLET BY MOUTH ONCE DAILY 11/04/17  Yes Seabron Spates R, DO  primidone (MYSOLINE) 50 MG tablet Take 100 mg by mouth at bedtime.  01/14/16  Yes [provider]  Vitamin D, Ergocalciferol, (DRISDOL) 50000 units CAPS capsule Take 1 capsule (50,000 Units total) by mouth every 7 (seven) days. 12/31/17  Yes Seabron Spates R, DO  azithromycin (ZITHROMAX) 250 MG tablet Take 1 tablet (250 mg total) by mouth daily. Patient not taking: Reported on 01/19/2018 01/01/18   Calvert Cantor, MD  cefpodoxime (VANTIN) 200 MG tablet Take 1 tablet (200 mg total) by mouth 2 (two) times daily. Patient not taking: Reported on 01/19/2018 01/01/18   Calvert Cantor, MD  Cholecalciferol (VITAMIN D-3) 1000 units CAPS Take 1 capsule (1,000 Units total) by mouth daily. Patient not taking: Reported on 01/19/2018 12/31/17   Seabron Spates R, DO  cyanocobalamin (,VITAMIN B-12,) 1000 MCG/ML injection Inject 1ml subq weekly for 4 weeks.  (then once a month, will send in another rx for monthly) Patient not taking: Reported on 01/19/2018 12/31/17   Seabron Spates R, DO  cyanocobalamin (,VITAMIN B-12,) 1000 MCG/ML injection Inject 1ml subq monthly. (please make sure fill the weekly rx first) Patient not taking: Reported on 01/19/2018 12/31/17   Donato Schultz, DO       Physical Exam: BP (!) 127/59 (BP Location: Right Arm)   Pulse 75   Temp 98.8 F (37.1 C) (Oral)    Resp 18   Ht 5\' 6"  (1.676 m)   Wt 113.4 kg   SpO2 92%   BMI 40.35 kg/m  General appearance: Well-developed, obese elderly adult female, alert and in no acute distress, but appears very tired.   Eyes: Anicteric, conjunctiva pink, lids and lashes normal. PERRL.    ENT: No nasal deformity, discharge, epistaxis.  Hearing normal. OP tacky dry without lesions.   Skin: Warm and dry.  No jaundice.  No suspicious rashes or lesions. Cardiac: RRR, nl S1-S2, soft systolic murmur.  Capillary refill is brisk.  JVP not visible.  No LE edema.  Radial pulses 2+ and symmetric. Respiratory: Normal respiratory rate and rhythm.  CTAB without rales or wheezes. Abdomen: Abdomen soft.  No TTP. No ascites, distension, hepatosplenomegaly.  MSK: No deformities or effusions of the large joints.  No cyanosis or clubbing. Neuro: Cranial nerves normal.  Sensation intact to light touch. Speech is fluent.  Muscle strength 4/5, symmetric, globally weak, needs assistance to sit up.    Psych: Sensorium intact and responding to questions, attention normal.  Behavior appropriate.  Affect normal.  Judgment and insight appear normal.     Labs on Admission:  I have personally reviewed following labs and imaging studies: CBC: Recent Labs  Lab 01/19/18 2010 01/20/18 0100 01/20/18 0758  WBC 6.8 5.9 5.4  NEUTROABS 4.1  --   --   HGB 11.1* 10.8* 10.1*  HCT 35.6* 34.7* 32.4*  MCV 88.8 89.7 89.8  PLT 209 216 205   Basic Metabolic Panel: Recent Labs  Lab 01/19/18 2010 01/20/18 0100 01/20/18 0758  NA 141  --  141  K 3.5  --  3.0*  CL 103  --  105  CO2 25  --  26  GLUCOSE 94  --  84  BUN 18  --  16  CREATININE 1.30* 1.08* 1.02*  CALCIUM 8.9  --  8.3*  MG  --  1.8  --    GFR: Estimated Creatinine Clearance: 55.2 mL/min (A) (by C-G formula based on SCr of 1.02 mg/dL (H)).  Liver Function Tests: Recent Labs  Lab 01/19/18 2010  AST 8*  ALT <5  ALKPHOS 89  BILITOT 0.9  PROT 6.8  ALBUMIN 3.7   No results  for input(s): LIPASE, AMYLASE in the last 168 hours. No results for input(s): AMMONIA in the last 168 hours. Coagulation Profile: No results for input(s): INR, PROTIME in the last 168 hours. Cardiac Enzymes: Recent Labs  Lab 01/20/18 0100 01/20/18 0758  CKTOTAL 43  --   TROPONINI <0.03 <0.03   BNP (last 3 results) No results for input(s): PROBNP in the last 8760 hours. HbA1C: No results for input(s): HGBA1C in the last 72 hours. CBG: No results for input(s): GLUCAP in the last 168 hours. Lipid Profile: No results for input(s): CHOL, HDL, LDLCALC, TRIG, CHOLHDL, LDLDIRECT in the last 72 hours. Thyroid Function Tests: Recent Labs    01/20/18 0100  TSH 1.888   Anemia Panel: Recent Labs    01/20/18 0100  VITAMINB12 522  FOLATE 1.4*  FERRITIN 81  TIBC 299  IRON 151  RETICCTPCT 0.9   Sepsis Labs: Invalid input(s): PROCALCITONIN, LACTICACIDVEN No results found for this or any previous visit (from the past 240 hour(s)).       Radiological Exams on Admission: Personally reviewed CXR shows no focal airspace disease or opacity; CT head and CT chest without contrast unremarkable, but CTA chest shows new PE, RV/LV ratio 1: Dg Chest 2 View  Result Date: 01/19/2018 CLINICAL DATA:  Pneumonia 2 weeks ago. EXAM: CHEST - 2 VIEW COMPARISON:  01/14/2018. FINDINGS: Low lung volumes. The lungs are clear without focal pneumonia, edema, pneumothorax or pleural effusion. Right middle lobe atelectasis or scarring evident. The cardiopericardial silhouette is within normal limits for size. The visualized bony structures of the thorax are intact. Telemetry leads overlie the chest. IMPRESSION: No active cardiopulmonary disease. Electronically Signed   By: Kennith Center M.D.   On: 01/19/2018 20:03   Ct Head Wo Contrast  Result Date: 01/20/2018 CLINICAL DATA:  Altered mental status EXAM: CT HEAD WITHOUT CONTRAST TECHNIQUE: Contiguous axial images were obtained from the base of the skull through the  vertex without intravenous contrast. COMPARISON:  None. FINDINGS: Brain: There is no mass,  hemorrhage or extra-axial collection. The size and configuration of the ventricles and extra-axial CSF spaces are normal. The brain parenchyma is normal, without evidence of acute or chronic infarction. Vascular: No abnormal hyperdensity of the major intracranial arteries or dural venous sinuses. No intracranial atherosclerosis. Skull: The visualized skull base, calvarium and extracranial soft tissues are normal. Sinuses/Orbits: No fluid levels or advanced mucosal thickening of the visualized paranasal sinuses. No mastoid or middle ear effusion. The orbits are normal. IMPRESSION: Normal head CT. Electronically Signed   By: Deatra Robinson M.D.   On: 01/20/2018 00:31   Ct Chest Wo Contrast  Result Date: 01/20/2018 CLINICAL DATA:  Pneumonia for 2 weeks on antibiotics. History of hypertension. EXAM: CT CHEST WITHOUT CONTRAST TECHNIQUE: Multidetector CT imaging of the chest was performed following the standard protocol without IV contrast. COMPARISON:  None. FINDINGS: Cardiovascular: Evaluation of vascular structures is limited on noncontrast imaging. Heart size is normal. No pericardial effusions. Minimal coronary artery calcifications. Normal caliber thoracic aorta with scattered calcification. Mediastinum/Nodes: Diffuse enlargement of the thyroid gland without focal nodularity. No significant lymphadenopathy in the chest. Esophagus is decompressed. Lungs/Pleura: Lungs are clear. No pleural effusions. No pneumothorax. Airways are patent. Upper Abdomen: Layering density in the gallbladder likely representing small stones. Musculoskeletal: Degenerative changes in the spine. No destructive bone lesions. IMPRESSION: No evidence of active pulmonary disease. Electronically Signed   By: Burman Nieves M.D.   On: 01/20/2018 00:39   Ct Angio Chest Pe W Or Wo Contrast  Result Date: 01/20/2018 CLINICAL DATA:  84 year old female  with acute shortness of breath and elevated D-dimer. EXAM: CT ANGIOGRAPHY CHEST WITH CONTRAST TECHNIQUE: Multidetector CT imaging of the chest was performed using the standard protocol during bolus administration of intravenous contrast. Multiplanar CT image reconstructions and MIPs were obtained to evaluate the vascular anatomy. CONTRAST:  ISOVUE-370 IOPAMIDOL (ISOVUE-370) INJECTION 76% COMPARISON:  01/19/2018 noncontrast chest CT. FINDINGS: Cardiovascular:  This is a technically satisfactory study. Pulmonary emboli are identified within the distal RIGHT and LEFT main pulmonary arteries extending into the majority of the segmental pulmonary arteries bilaterally. RV/LV ratio measures 1.0 and compatible with RIGHT heart strain. Mild aortic atherosclerotic calcifications noted without evidence of thoracic aortic aneurysm. No pericardial effusion. Mediastinum/Nodes: No enlarged mediastinal, hilar, or axillary lymph nodes. Thyroid gland, trachea, and esophagus demonstrate no significant findings. Lungs/Pleura: Minimal bibasilar atelectasis noted. No airspace disease, consolidation, nodule, mass, pleural effusion or pneumothorax. Upper Abdomen: No acute abnormality Musculoskeletal: No acute or suspicious bony abnormality. Review of the MIP images confirms the above findings. IMPRESSION: 1. Bilateral pulmonary emboli with CT evidence of RIGHT heart strain. 2.  Aortic Atherosclerosis (ICD10-I70.0). Critical Value/emergent results were called by telephone at the time of interpretation on 01/20/2018 at 4:17 pm to [facility] ,nurse for this patient, who verbally acknowledged these results. Electronically Signed   By: Harmon Pier M.D.   On: 01/20/2018 16:21    EKG: Independently reviewed. ECG from yesterday shows NSR, no ST changes, long PR.    Assessment/Plan  Acute pulmonary embolism:  Patient hypoxic with ambulation this morning, d-dimer elevated (ordered by admitted MD).  CTA chest ordered by me this morning now  shows new PEs bialterally.  First VTE, provoked. -Start heparin gtt -Consult CM for NOAC coverage -Obtain echocardiogram   AKI:  Baseline Cr 0.8, improved today with fluids.  Orthostatics not done.  -Stop IVF  Hypertensive urgency:  Normalized without medications -Hold ARB for now, valsartan -Continue amlodipine -Continue hydralazine for severe range pressures  -Continue  pravastatin  Anemia Of chronic idsease, stable relative to baseline  Parkinson's disease:  -Continue Sinemet, amantadine  Prolonged QTc:  -Repeat ECG tomorrow -Telemetry monitoring  Depression:  -Continue Zyprexa, fluoxetine  Other medications:  -Continue Pepcid -Continue ENsure -Continue primidone  Folic acid deficiency -Start new folate     DVT prophylaxis: N/A on heparin  Code Status: FULL  Family Communication: None present   Admission status: INPATIENT  At the time of admission, it appears that the appropriate admission status for this patient is INPATIENT. This is judged to be reasonable and necessary in order to provide the required intensity of service to ensure the patient's safety given the presenting symptoms (dyspnea), physical exam findings (hypoxia on exertion), and initial radiographic and laboratory data in the context of their chronic comorbidities (parkinson's advanced age).  Together, these circumstances are felt to place her at high risk for further clinical deterioration threatening life, limb, or organ.   Patient requires inpatient status due to high intensity of service, high risk for further deterioration and high frequency of surveillance required because of this acute illness (new pulmonary embolism) that poses a threat to life, limb or bodily function.  I certify that at the point of admission it is my clinical judgment that the patient will require inpatient hospital care spanning beyond 2 midnights from the point of admission and that early discharge would result in  unnecessary risk of decompensation and readmission or threat to life, limb or bodily function.    Medical decision making: Patient seen at 10:50 AM on 01/20/2018.   What exists of the patient's chart was reviewed in depth and summarized above.  Alberteen Sam [facility] Pager [phone]

## 2018-01-21 ENCOUNTER — Inpatient Hospital Stay (HOSPITAL_COMMUNITY): Payer: Medicare Other

## 2018-01-21 DIAGNOSIS — I5030 Unspecified diastolic (congestive) heart failure: Secondary | ICD-10-CM

## 2018-01-21 LAB — BASIC METABOLIC PANEL
Anion gap: 10 (ref 5–15)
BUN: 15 mg/dL (ref 8–23)
CO2: 26 mmol/L (ref 22–32)
CREATININE: 0.99 mg/dL (ref 0.44–1.00)
Calcium: 8.6 mg/dL — ABNORMAL LOW (ref 8.9–10.3)
Chloride: 106 mmol/L (ref 98–111)
GFR calc Af Amer: >60 mL/min (ref >60)
GFR, EST NON AFRICAN AMERICAN: 52 mL/min — AB (ref >60)
Glucose, Bld: 93 mg/dL (ref 70–99)
Potassium: 3.5 mmol/L (ref 3.5–5.1)
SODIUM: 142 mmol/L (ref 135–145)

## 2018-01-21 LAB — CBC
HCT: 35.2 % — ABNORMAL LOW (ref 36.0–46.0)
Hemoglobin: 10.7 g/dL — ABNORMAL LOW (ref 12.0–15.0)
MCH: 27.2 pg (ref 26.0–34.0)
MCHC: 30.4 g/dL (ref 30.0–36.0)
MCV: 89.3 fL (ref 80.0–100.0)
Platelets: 221 10*3/uL (ref 150–400)
RBC: 3.94 MIL/uL (ref 3.87–5.11)
RDW: 18.1 % — AB (ref 11.5–15.5)
WBC: 6.6 10*3/uL (ref 4.0–10.5)
nRBC: 0 % (ref 0.0–0.2)

## 2018-01-21 LAB — ECHOCARDIOGRAM COMPLETE
HEIGHTINCHES: 66 in
Weight: 4010.61 oz

## 2018-01-21 LAB — HEPARIN LEVEL (UNFRACTIONATED)
HEPARIN UNFRACTIONATED: 0.57 [IU]/mL (ref 0.30–0.70)
Heparin Unfractionated: 0.86 IU/mL — ABNORMAL HIGH (ref 0.30–0.70)
Heparin Unfractionated: 0.65 IU/mL (ref 0.30–0.70)

## 2018-01-21 NOTE — Progress Notes (Signed)
ANTICOAGULATION CONSULT NOTE - Initial Consult  Pharmacy Consult for Heparin Indication: pulmonary embolus  Allergies  Allergen Reactions  . Codeine Nausea And Vomiting  . Gabapentin Swelling    Caused Bilateral feet swelling  . Latex Other (See Comments)    Burns the skin  . Morphine Sulfate Nausea And Vomiting  . Sertraline Hcl     Unknown reaction  . Penicillins Other (See Comments)    Has patient had a PCN reaction causing immediate rash, facial/tongue/throat swelling, SOB or lightheadedness with hypotension: Unknown Has patient had a PCN reaction causing severe rash involving mucus membranes or skin necrosis: Unknown Has patient had a PCN reaction that required hospitalization: Unknown Has patient had a PCN reaction occurring within the last 10 years: Unknown If all of the above answers are "NO", then may proceed with Cephalosporin use.     Patient Measurements: Height: 5\' 6"  (167.6 cm) Weight: 250 lb 10.6 oz (113.7 kg) IBW/kg (Calculated) : 59.3 Heparin Dosing Weight: 85 kg  Vital Signs: Temp: 98.1 F (36.7 C) (11/06 1000) Temp Source: Oral (11/06 1000) BP: 121/64 (11/06 1000) Pulse Rate: 71 (11/06 1000)  Labs: Recent Labs    01/20/18 0100 01/20/18 0758 01/21/18 0154 01/21/18 1204  HGB 10.8* 10.1* 10.7*  --   HCT 34.7* 32.4* 35.2*  --   PLT 216 205 221  --   HEPARINUNFRC  --   --  0.86* 0.57  CREATININE 1.08* 1.02* 0.99  --   CKTOTAL 43  --   --   --   TROPONINI <0.03 <0.03  --   --     Estimated Creatinine Clearance: 57.1 mL/min (by C-G formula based on SCr of 0.99 mg/dL).   Medical History: Past Medical History:  Diagnosis Date  . Allergic drug reaction    Opioids  . 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 Back pain, thoracic   . Benign familial tremor   . Bruise   . Calf pain   . Chest pain    -- Myoview negative 2007  . Chronic pain syndrome   . Depression   . Edema   . Gallstones   .  Hypertension   . Hypokalemia   . Meniscus tear   . Myalgia   . Osteoarthritis   . Other symptoms referable to lower leg joint   . Other urinary incontinence   . Scoliosis   . Skin rash   . Tinea corporis     Medications:   No oral anticoagulation PTA  Upon admission, patient was ordered Lovenox 40mg  sq q24h (last dose administered 01/20/2018 @ 01:53)  Assessment: 84 yr female with recent diagnosis of PNA presents with SOB and neuropathy in hands and feet.  PMH significant for Parkinson's Disease, HTN, and anemia. CT head on 11/4 = negative for bleed. Dopplers on 11/5 = indeterminate DVT of right popliteal vein; no left DVT. CTAngio on 11/5 = + bilateral pulmonary embolism with evidence of right heart strain. Pharmacy consulted to dose IV heparin  Today, 01/21/18  Heparin level therapeutic after IV heparin rate decreased early this morning due to elevated level  CBC stable  Stable SCr  Per RN, no reported bleeding or problems with IV site  Goal of Therapy:  Heparin level 0.3-0.7 units/ml Monitor platelets by anticoagulation protocol: Yes   Plan:  1) Continue IV heparin at current rate of 1250 units/hr 2) Will recheck heparin level in 8 hours to confirm continued goal heparin  level at current IV heparin rate 3) Daily heparin level and CBC   Hessie Knows, PharmD, BCPS Pager [phone] 01/21/2018 1:14 PM

## 2018-01-21 NOTE — Progress Notes (Signed)
OT Cancellation Note  Patient Details Name: [name] MRN: [id number] DOB: 28-May-1933   Cancelled Treatment:    Reason Eval/Treat Not Completed: Medical issues which prohibited therapy.  Pt with an acute PE and heparin is not therapeutic. Will check back tomorrow  [name] 01/21/2018, 12:58 PM  [name], OTR/L Acute Rehabilitation Services [phone] [facility] pager [phone] office 01/21/2018

## 2018-01-21 NOTE — Care Management Note (Signed)
Case Management Note  Patient Details  Name: [name] MRN: [id number] Date of Birth: May 07, 1933  Subjective/Objective: Pt home with Generalized weakness              Action/Plan: Pt selected to continue with [name]   Expected Discharge Date:  (unknown)               Expected Discharge Plan:  Home w Home Health Services  In-House Referral:  Clinical Social Work  Discharge planning Services  CM Consult  Post Acute Care Choice:    Choice offered to:     DME Arranged:    DME Agency:     HH Arranged:  RN, PT, Nurse's Aide HH Agency:  [facility]  Status of Service:  Completed, signed off  If discussed at [employer] of Stay Meetings, dates discussed:    Additional CommentsGeni Bers, RN 01/21/2018, 2:33 PM

## 2018-01-21 NOTE — [place] FL2 (Signed)
[place] MEDICAID FL2 LEVEL OF CARE SCREENING TOOL     IDENTIFICATION  Patient Name: [name] Birthdate: October 06, 1933 Sex: female Admission Date (Current Location): 01/19/2018  [facility] and [place] Number:  Producer, television/film/video and Address:  [facility],  [address] [place], [place] [address]      Provider Number: [id number]  Attending Physician Name and Address:  Alberteen Sam, *  Relative Name and Phone Number:  Desteni Piscopo (daughter) [phone]    Current Level of Care: Hospital Recommended Level of Care: Skilled Nursing Facility Prior Approval Number:    Date Approved/Denied:   PASRR Number: [id number]  Discharge Plan: SNF    Current Diagnoses: Patient Active Problem List   Diagnosis Date Noted  . Acute pulmonary embolism (HCC) 01/20/2018  . Hypertensive urgency 01/19/2018  . Generalized weakness 01/19/2018  . ARF (acute renal failure) (HCC) 01/19/2018  . Weakness generalized 01/19/2018  . AKI (acute kidney injury) (HCC)   . Vitamin B 12 deficiency 01/01/2018  . Low vitamin D level 01/01/2018  . Syncope and collapse 12/30/2017  . Nausea and vomiting 12/29/2017  . Prolonged QT interval 12/29/2017  . CAP (community acquired pneumonia) 12/28/2017  . Dyspepsia 09/25/2017  . 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 Toe pain, right 11/17/2016  . Left shoulder pain 01/19/2016  . Hyperlipidemia 05/03/2014  . Disc disorder of lumbar region 12/30/2013  . Obesity (BMI 30-39.9) 07/01/2013  . Biliary dyskinesia 12/10/2011  . UTI (urinary tract infection) 07/24/2011  . Lumbar back pain 06/12/2011  . Symptomatic Parkinson disease (HCC) 02/26/2011  . Depression 09/28/2010  . Pre-operative cardiovascular examination 07/23/2010  . CAROTID BRUIT, RIGHT 06/29/2009  . CHEST PAIN-UNSPECIFIED 01/13/2009  . SKIN RASH 10/11/2008  . EDEMA 08/25/2008  . OTHER URINARY INCONTINENCE 02/15/2008  . CHRONIC PAIN SYNDROME 12/31/2007  . BRUISE 09/14/2007  .  SCOLIOSIS, LUMBAR SPINE 06/15/2007  . BACK PAIN, THORACIC REGION, RIGHT 06/11/2007  . MENISCUS TEAR 03-21-2006  . OTHER SYMPTOMS REFERABLE TO LOWER LEG JOINT 02/17/2007  . MYALGIA 02/10/2007  . CALF PAIN, RIGHT 02/10/2007  . DEPRESSION 01/01/2007  . HYPOKALEMIA, HX OF 01/01/2007  . TINEA CORPORIS 09/22/2006  . ALLERGIC DRUG REACTION, OPIOIDS 09/22/2006  . Essential hypertension 08/20/2006  . OSTEOARTHRITIS 08/20/2006    Orientation RESPIRATION BLADDER Height & Weight     Self, Time, Situation, Place  Normal Incontinent, External catheter Weight: 250 lb 10.6 oz (113.7 kg) Height:  5\' 6"  (167.6 cm)  BEHAVIORAL SYMPTOMS/MOOD NEUROLOGICAL BOWEL NUTRITION STATUS      Continent Diet(Regular diet)  AMBULATORY STATUS COMMUNICATION OF NEEDS Skin   Limited Assist Verbally Normal                       Personal Care Assistance Level of Assistance  Bathing, Feeding, Dressing Bathing Assistance: Limited assistance Feeding assistance: Limited assistance Dressing Assistance: Limited assistance     Functional Limitations Info  Sight, Hearing, Speech Sight Info: Impaired(Wears glasses) Hearing Info: Impaired(Hard of hearing) Speech Info: Adequate    SPECIAL CARE FACTORS FREQUENCY  PT (By licensed PT), OT (By licensed OT)     PT Frequency: 2x weekly OT Frequency: 2x weekly            Contractures Contractures Info: Not present    Additional Factors Info  Code Status, Allergies Code Status Info: FULL Allergies Info: Codeine, Gabapentin, Latex, Morphine Sulfate, Sertraline Hcl, Penicillins           Current Medications (01/21/2018):  This  is the current hospital active medication list Current Facility-Administered Medications  Medication Dose Route Frequency Provider Last Rate Last Dose  . acetaminophen (TYLENOL) tablet 650 mg  650 mg Oral Q6H PRN Eduard Clos, MD       Or  . acetaminophen (TYLENOL) suppository 650 mg  650 mg Rectal Q6H PRN Eduard Clos, MD      . amantadine (SYMMETREL) capsule 100 mg  100 mg Oral QHS Eduard Clos, MD   100 mg at 01/20/18 2221  . amLODipine (NORVASC) tablet 10 mg  10 mg Oral Daily Eduard Clos, MD   10 mg at 01/21/18 0941  . carbidopa-levodopa (SINEMET IR) 25-100 MG per tablet immediate release 4 tablet  4 tablet Oral QID Eduard Clos, MD   4 tablet at 01/21/18 [phone]  . famotidine (PEPCID) tablet 20 mg  20 mg Oral BID Eduard Clos, MD   20 mg at 01/21/18 9604  . feeding supplement (ENSURE ENLIVE) (ENSURE ENLIVE) liquid 237 mL  237 mL Oral BID BM Eduard Clos, MD   237 mL at 01/21/18 0941  . FLUoxetine (PROZAC) capsule 40 mg  40 mg Oral Daily Eduard Clos, MD   40 mg at 01/21/18 0939  . folic acid (FOLVITE) tablet 1 mg  1 mg Oral Daily Danford, Earl Lites, MD   1 mg at 01/21/18 0940  . heparin ADULT infusion 100 units/mL (25000 units/239mL sodium chloride 0.45%)  1,250 Units/hr Intravenous Continuous Alberteen Sam, MD 12.5 mL/hr at 01/21/18 0945 1,250 Units/hr at 01/21/18 0945  . hydrALAZINE (APRESOLINE) injection 5 mg  5 mg Intravenous Q4H PRN Eduard Clos, MD      . HYDROcodone-acetaminophen (NORCO/VICODIN) 5-325 MG per tablet 1-2 tablet  1-2 tablet Oral Q6H PRN Macon Large, NP   1 tablet at 01/20/18 0153  . OLANZapine (ZYPREXA) tablet 10 mg  10 mg Oral Daily Eduard Clos, MD   10 mg at 01/21/18 0939  . ondansetron (ZOFRAN) injection 4 mg  4 mg Intravenous Q6H PRN Bodenheimer, Charles A, NP   4 mg at 01/20/18 0153  . pantoprazole (PROTONIX) EC tablet 40 mg  40 mg Oral BID AC Eduard Clos, MD   40 mg at 01/21/18 0940  . pravastatin (PRAVACHOL) tablet 20 mg  20 mg Oral Daily Eduard Clos, MD   20 mg at 01/21/18 5409  . primidone (MYSOLINE) tablet 100 mg  100 mg Oral QHS Eduard Clos, MD   100 mg at 01/20/18 2221  . Vitamin D (Ergocalciferol) (DRISDOL) capsule 50,000 Units  50,000 Units Oral Q7 days  Eduard Clos, MD   50,000 Units at 01/20/18 1123     Discharge Medications: Please see discharge summary for a list of discharge medications.  Relevant Imaging Results:  Relevant Lab Results:   Additional Information SSN: [id number]  Darreld Mclean, [place]

## 2018-01-21 NOTE — Progress Notes (Signed)
Eilquis and Xarelto co pay is $38.70/ insurance co. However pt is going to SNF.

## 2018-01-21 NOTE — Progress Notes (Signed)
ANTICOAGULATION CONSULT NOTE   Pharmacy Consult for Heparin Indication: pulmonary embolus  Allergies  Allergen Reactions  . Codeine Nausea And Vomiting  . Gabapentin Swelling    Caused Bilateral feet swelling  . Latex Other (See Comments)    Burns the skin  . Morphine Sulfate Nausea And Vomiting  . Sertraline Hcl     Unknown reaction  . Penicillins Other (See Comments)    Has patient had a PCN reaction causing immediate rash, facial/tongue/throat swelling, SOB or lightheadedness with hypotension: Unknown Has patient had a PCN reaction causing severe rash involving mucus membranes or skin necrosis: Unknown Has patient had a PCN reaction that required hospitalization: Unknown Has patient had a PCN reaction occurring within the last 10 years: Unknown If all of the above answers are "NO", then may proceed with Cephalosporin use.    Patient Measurements: Height: 5\' 6"  (167.6 cm) Weight: 250 lb 10.6 oz (113.7 kg) IBW/kg (Calculated) : 59.3 Heparin Dosing Weight: 85 kg  Vital Signs: Temp: 99.1 F (37.3 C) (11/06 1324) Temp Source: Oral (11/06 1324) BP: 130/66 (11/06 1324) Pulse Rate: 74 (11/06 1324)  Labs: Recent Labs    01/20/18 0100 01/20/18 0758 01/21/18 0154 01/21/18 1204 01/21/18 2000  HGB 10.8* 10.1* 10.7*  --   --   HCT 34.7* 32.4* 35.2*  --   --   PLT 216 205 221  --   --   HEPARINUNFRC  --   --  0.86* 0.57 0.65  CREATININE 1.08* 1.02* 0.99  --   --   CKTOTAL 43  --   --   --   --   TROPONINI <0.03 <0.03  --   --   --    Estimated Creatinine Clearance: 57.1 mL/min (by C-G formula based on SCr of 0.99 mg/dL).  Medical History: Past Medical History:  Diagnosis Date  . Allergic drug reaction    Opioids  . 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 Back pain, thoracic   . Benign familial tremor   . Bruise   . Calf pain   . Chest pain    -- Myoview negative 2007  . Chronic pain syndrome   . Depression   . Edema    . Gallstones   . Hypertension   . Hypokalemia   . Meniscus tear   . Myalgia   . Osteoarthritis   . Other symptoms referable to lower leg joint   . Other urinary incontinence   . Scoliosis   . Skin rash   . Tinea corporis    Medications:   No oral anticoagulation PTA  Upon admission, patient was ordered Lovenox 40mg  sq q24h (last dose administered 01/20/2018 @ 01:53)  Assessment: 84 yr female with recent diagnosis of PNA presents with SOB and neuropathy in hands and feet.  PMH significant for Parkinson's Disease, HTN, and anemia. CT head on 11/4 = negative for bleed. Dopplers on 11/5 = indeterminate DVT of right popliteal vein; no left DVT. CTAngio on 11/5 = + bilateral pulmonary embolism with evidence of right heart strain. Pharmacy consulted to dose IV heparin  Today, 01/21/18  3rd Heparin level 0.65 units/ml this evening on infusion at 1250 units/hr  CBC stable  Stable SCr  Per RN, no reported bleeding or problems with IV site  Goal of Therapy:  Heparin level 0.3-0.7 units/ml Monitor platelets by anticoagulation protocol: Yes   Plan:   Continue Heparin at 1250 units/hr  Daily Heparin level, daily  CBC  Otho Bellows PharmD Pager [phone] 01/21/2018, 9:18 PM

## 2018-01-21 NOTE — Progress Notes (Signed)
PROGRESS NOTE    [name]  BMW:[id number] DOB: 01/18/1934 DOA: 01/19/2018 PCP: [name] Schultz, DO      Brief Narrative:  [name] is an 84 y.o. F with Parkinson's disease, HTN, anemia, and recently admitted for community acquired pneumonia who presents with several days of progressive weakness, now progressed to severe dyspnea on exertion.  She has had no chest pain, hemoptysis, obvious leg swelling.  No personal history of DVT or PE.  No fever, sputum production.  In the emergency room, she had a clear chest x-ray, no fever or leukocytosis.  She her creatinine was slightly elevated from baseline, so she was admitted for observation, IV fluids for presumed dehydration.  On further evaluation, she was found to have bilateral PE.     Assessment & Plan:  Acute pulmonary embolism PESI 101 due to age, hypoxia.  Intermediate risk.  On heparin.  Equal and low copays for Xarelto and Eliquis. -Continue heparin gtt for one more day -Obtain echo -Transition to Eliquis tomorrow, then home   Acute kidney injury Creatinine normalized with fluids -Hold ARB  Hypertensive urgency Essential hypertension BP normalized on its own. -Continue amlodipine, pravastatin -Hold ARB -PRN hydralazine for severe range pressures  Anemia of chronic disease Hemoglobin stable from yesterday.  Parkinson's disease Well controlled -Continue Sinemet, amantadine  Prolonged QTC ECG personally reviewed, shows normalized QTc 469.  Depression -Continue Zyprexa, fluoxetine  Folate deficiency -Continue new folate  Other medications -Continue Pepcid, Ensure, primidone  Moderate protein calorie malnutrition As defined by poor PO intake over last month, loss of subcutaneous muscle.   -Continue supplement     MDM and disposition: The below labs and imaging reports were reviewed and summarized above.  Medication management as above.  The patient was admitted with acute pulmonary  embolism, we are titrating her anticoagulation and plan to transition to Eliquis tomorrow.          DVT prophylaxis: N/A, has PE, on therapeutic anticoagulation Code Status: FULL Family Communication: Daughter by phone    Consultants:   None  Procedures:   CTA chest  Antimicrobials:   None    Subjective: Feeling well.  Very tired and dyspneic with exertion, but no syncope, no chest pain, no confusion, no fever, no sputum.  Objective: Vitals:   01/20/18 0528 01/20/18 2125 01/21/18 0501 01/21/18 1000  BP: (!) 127/59 (!) 154/68 109/68 121/64  Pulse: 75 74 68 71  Resp: 18 18 18 18   Temp: 98.8 F (37.1 C) 97.9 F (36.6 C) 98.6 F (37 C) 98.1 F (36.7 C)  TempSrc: Oral Oral Oral Oral  SpO2: 92% 93% 92% 93%  Weight:   113.7 kg   Height:        Intake/Output Summary (Last 24 hours) at 01/21/2018 1205 Last data filed at 01/21/2018 1052 Gross per 24 hour  Intake 403.6 ml  Output 325 ml  Net 78.6 ml   Filed Weights   01/19/18 1846 01/21/18 0501  Weight: 113.4 kg 113.7 kg    Examination: General appearance: Elderly adult female, alert and in no acute distress.  Lying in bed, interactive. HEENT: Anicteric, conjunctiva pink, lids and lashes normal. No nasal deformity, discharge, epistaxis.  Lips moist, dentition good, no oral lesions, OP moist, hearing normal.   Skin: Warm and dry.  No suspicious rashes or lesions. Cardiac: RRR, nl S1-S2, no murmurs appreciated.  Capillary refill is brisk.  JVP not visible.  No LE edema.  Radia pulses 2+ and symmetric. Respiratory:  Shallow, normal rate.  On O2 by [place].  No rales or wheezes appreciated. Abdomen: Abdomen soft.  No TTP. No ascites, distension, hepatosplenomegaly.   MSK: No deformities or effusions. Neuro: Awake and alert.  EOMI, moves all extremities with 4/5 symmetric strength. Speech fluent.    Psych: Sensorium intact and responding to questions, attention normal. Affect blunted.  Judgment and insight appear  normal.    Data Reviewed: I have personally reviewed following labs and imaging studies:  CBC: Recent Labs  Lab 01/19/18 2010 01/20/18 0100 01/20/18 0758 01/21/18 0154  WBC 6.8 5.9 5.4 6.6  NEUTROABS 4.1  --   --   --   HGB 11.1* 10.8* 10.1* 10.7*  HCT 35.6* 34.7* 32.4* 35.2*  MCV 88.8 89.7 89.8 89.3  PLT 209 216 205 221   Basic Metabolic Panel: Recent Labs  Lab 01/19/18 2010 01/20/18 0100 01/20/18 0758 01/21/18 0154  NA 141  --  141 142  K 3.5  --  3.0* 3.5  CL 103  --  105 106  CO2 25  --  26 26  GLUCOSE 94  --  84 93  BUN 18  --  16 15  CREATININE 1.30* 1.08* 1.02* 0.99  CALCIUM 8.9  --  8.3* 8.6*  MG  --  1.8  --   --    GFR: Estimated Creatinine Clearance: 57.1 mL/min (by C-G formula based on SCr of 0.99 mg/dL). Liver Function Tests: Recent Labs  Lab 01/19/18 2010  AST 8*  ALT <5  ALKPHOS 89  BILITOT 0.9  PROT 6.8  ALBUMIN 3.7   No results for input(s): LIPASE, AMYLASE in the last 168 hours. No results for input(s): AMMONIA in the last 168 hours. Coagulation Profile: No results for input(s): INR, PROTIME in the last 168 hours. Cardiac Enzymes: Recent Labs  Lab 01/20/18 0100 01/20/18 0758  CKTOTAL 43  --   TROPONINI <0.03 <0.03   BNP (last 3 results) No results for input(s): PROBNP in the last 8760 hours. HbA1C: No results for input(s): HGBA1C in the last 72 hours. CBG: No results for input(s): GLUCAP in the last 168 hours. Lipid Profile: No results for input(s): CHOL, HDL, LDLCALC, TRIG, CHOLHDL, LDLDIRECT in the last 72 hours. Thyroid Function Tests: Recent Labs    01/20/18 0100  TSH 1.888   Anemia Panel: Recent Labs    01/20/18 0100  VITAMINB12 522  FOLATE 1.4*  FERRITIN 81  TIBC 299  IRON 151  RETICCTPCT 0.9   Urine analysis:    Component Value Date/Time   COLORURINE YELLOW 01/20/2018 0108   APPEARANCEUR HAZY (A) 01/20/2018 0108   LABSPEC 1.021 01/20/2018 0108   PHURINE 6.0 01/20/2018 0108   GLUCOSEU NEGATIVE  01/20/2018 0108   HGBUR LARGE (A) 01/20/2018 0108   HGBUR negative 02/15/2008 1302   BILIRUBINUR NEGATIVE 01/20/2018 0108   BILIRUBINUR neg 08/18/2015 1459   KETONESUR 20 (A) 01/20/2018 0108   PROTEINUR 100 (A) 01/20/2018 0108   UROBILINOGEN 0.2 08/18/2015 1459   UROBILINOGEN 0.2 08/02/2010 2217   NITRITE NEGATIVE 01/20/2018 0108   LEUKOCYTESUR MODERATE (A) 01/20/2018 0108   Sepsis Labs: @LABRCNTIP (procalcitonin:4,lacticacidven:4)  )No results found for this or any previous visit (from the past 240 hour(s)).       Radiology Studies: Dg Chest 2 View  Result Date: 01/19/2018 CLINICAL DATA:  Pneumonia 2 weeks ago. EXAM: CHEST - 2 VIEW COMPARISON:  01/14/2018. FINDINGS: Low lung volumes. The lungs are clear without focal pneumonia, edema, pneumothorax or pleural effusion. Right middle  lobe atelectasis or scarring evident. The cardiopericardial silhouette is within normal limits for size. The visualized bony structures of the thorax are intact. Telemetry leads overlie the chest. IMPRESSION: No active cardiopulmonary disease. Electronically Signed   By: Kennith Center M.D.   On: 01/19/2018 20:03   Ct Head Wo Contrast  Result Date: 01/20/2018 CLINICAL DATA:  Altered mental status EXAM: CT HEAD WITHOUT CONTRAST TECHNIQUE: Contiguous axial images were obtained from the base of the skull through the vertex without intravenous contrast. COMPARISON:  None. FINDINGS: Brain: There is no mass, hemorrhage or extra-axial collection. The size and configuration of the ventricles and extra-axial CSF spaces are normal. The brain parenchyma is normal, without evidence of acute or chronic infarction. Vascular: No abnormal hyperdensity of the major intracranial arteries or dural venous sinuses. No intracranial atherosclerosis. Skull: The visualized skull base, calvarium and extracranial soft tissues are normal. Sinuses/Orbits: No fluid levels or advanced mucosal thickening of the visualized paranasal sinuses. No  mastoid or middle ear effusion. The orbits are normal. IMPRESSION: Normal head CT. Electronically Signed   By: Deatra Robinson M.D.   On: 01/20/2018 00:31   Ct Chest Wo Contrast  Result Date: 01/20/2018 CLINICAL DATA:  Pneumonia for 2 weeks on antibiotics. History of hypertension. EXAM: CT CHEST WITHOUT CONTRAST TECHNIQUE: Multidetector CT imaging of the chest was performed following the standard protocol without IV contrast. COMPARISON:  None. FINDINGS: Cardiovascular: Evaluation of vascular structures is limited on noncontrast imaging. Heart size is normal. No pericardial effusions. Minimal coronary artery calcifications. Normal caliber thoracic aorta with scattered calcification. Mediastinum/Nodes: Diffuse enlargement of the thyroid gland without focal nodularity. No significant lymphadenopathy in the chest. Esophagus is decompressed. Lungs/Pleura: Lungs are clear. No pleural effusions. No pneumothorax. Airways are patent. Upper Abdomen: Layering density in the gallbladder likely representing small stones. Musculoskeletal: Degenerative changes in the spine. No destructive bone lesions. IMPRESSION: No evidence of active pulmonary disease. Electronically Signed   By: Burman Nieves M.D.   On: 01/20/2018 00:39   Ct Angio Chest Pe W Or Wo Contrast  Result Date: 01/20/2018 CLINICAL DATA:  84 year old female with acute shortness of breath and elevated D-dimer. EXAM: CT ANGIOGRAPHY CHEST WITH CONTRAST TECHNIQUE: Multidetector CT imaging of the chest was performed using the standard protocol during bolus administration of intravenous contrast. Multiplanar CT image reconstructions and MIPs were obtained to evaluate the vascular anatomy. CONTRAST:  ISOVUE-370 IOPAMIDOL (ISOVUE-370) INJECTION 76% COMPARISON:  01/19/2018 noncontrast chest CT. FINDINGS: Cardiovascular:  This is a technically satisfactory study. Pulmonary emboli are identified within the distal RIGHT and LEFT main pulmonary arteries extending  into the majority of the segmental pulmonary arteries bilaterally. RV/LV ratio measures 1.0 and compatible with RIGHT heart strain. Mild aortic atherosclerotic calcifications noted without evidence of thoracic aortic aneurysm. No pericardial effusion. Mediastinum/Nodes: No enlarged mediastinal, hilar, or axillary lymph nodes. Thyroid gland, trachea, and esophagus demonstrate no significant findings. Lungs/Pleura: Minimal bibasilar atelectasis noted. No airspace disease, consolidation, nodule, mass, pleural effusion or pneumothorax. Upper Abdomen: No acute abnormality Musculoskeletal: No acute or suspicious bony abnormality. Review of the MIP images confirms the above findings. IMPRESSION: 1. Bilateral pulmonary emboli with CT evidence of RIGHT heart strain. 2.  Aortic Atherosclerosis (ICD10-I70.0). Critical Value/emergent results were called by telephone at the time of interpretation on 01/20/2018 at 4:17 pm to [employer] ,nurse for this patient, who verbally acknowledged these results. Electronically Signed   By: Harmon Pier M.D.   On: 01/20/2018 16:21   Vas [place] Lower Extremity Venous (dvt)  Result Date: 01/20/2018  Lower Venous Study Indications: Swelling, and Edema.  Limitations: Body habitus and poor ultrasound/tissue interface. Performing Technologist: Blanch Media  Examination Guidelines: A complete evaluation includes B-mode imaging, spectral Doppler, color Doppler, and power Doppler as needed of all accessible portions of each vessel. Bilateral testing is considered an integral part of a complete examination. Limited examinations for reoccurring indications may be performed as noted.  Right Venous Findings: +---------+---------------+---------+-----------+----------+--------------+          CompressibilityPhasicitySpontaneityPropertiesSummary        +---------+---------------+---------+-----------+----------+--------------+ CFV      Full           Yes      Yes                                  +---------+---------------+---------+-----------+----------+--------------+ SFJ      Full                                                        +---------+---------------+---------+-----------+----------+--------------+ FV Prox  Full                                                        +---------+---------------+---------+-----------+----------+--------------+ FV Mid   Full                                                        +---------+---------------+---------+-----------+----------+--------------+ FV Distal                                             not visualized +---------+---------------+---------+-----------+----------+--------------+ PFV      Full                                                        +---------+---------------+---------+-----------+----------+--------------+ POP      None           No       No                                  +---------+---------------+---------+-----------+----------+--------------+ PTV      Full                                                        +---------+---------------+---------+-----------+----------+--------------+ PERO  not visualized +---------+---------------+---------+-----------+----------+--------------+  Left Venous Findings: +---------+---------------+---------+-----------+----------+--------------+          CompressibilityPhasicitySpontaneityPropertiesSummary        +---------+---------------+---------+-----------+----------+--------------+ CFV      Full           Yes      Yes                                 +---------+---------------+---------+-----------+----------+--------------+ SFJ      Full                                                        +---------+---------------+---------+-----------+----------+--------------+ FV Prox  Full                                                         +---------+---------------+---------+-----------+----------+--------------+ FV Mid   Full                                                        +---------+---------------+---------+-----------+----------+--------------+ FV Distal                                             not visualized +---------+---------------+---------+-----------+----------+--------------+ PFV      Full                                                        +---------+---------------+---------+-----------+----------+--------------+ POP      Full           Yes      Yes                                 +---------+---------------+---------+-----------+----------+--------------+ PTV      Full                                                        +---------+---------------+---------+-----------+----------+--------------+ PERO                                                  not visualized +---------+---------------+---------+-----------+----------+--------------+    Summary: Right: Findings consistent with age indeterminate deep vein thrombosis involving the right popliteal vein. Portions of this examination were limited- see technologist comments above. No cystic structure found in the popliteal fossa. Left: There is no evidence of deep vein thrombosis in the  lower extremity. However, portions of this examination were limited- see technologist comments above. No cystic structure found in the popliteal fossa.  *See table(s) above for measurements and observations. Electronically signed by Coral Else MD on 01/20/2018 at 6:53:43 PM.    Final         Scheduled Meds: . amantadine  100 mg Oral QHS  . amLODipine  10 mg Oral Daily  . carbidopa-levodopa  4 tablet Oral QID  . famotidine  20 mg Oral BID  . feeding supplement (ENSURE ENLIVE)  237 mL Oral BID BM  . FLUoxetine  40 mg Oral Daily  . folic acid  1 mg Oral Daily  . OLANZapine  10 mg Oral Daily  . pantoprazole  40 mg Oral BID AC  .  pravastatin  20 mg Oral Daily  . primidone  100 mg Oral QHS  . Vitamin D (Ergocalciferol)  50,000 Units Oral Q7 days   Continuous Infusions: . heparin 1,250 Units/hr (01/21/18 0945)     LOS: 1 day    Time spent: 35 mintues    Alberteen Sam, MD [facility] 01/21/2018, 12:05 PM     Pager [phone] --- please page though AMION:  [email or web address] If 7PM-7AM, please contact night-coverage

## 2018-01-21 NOTE — Progress Notes (Signed)
ANTICOAGULATION CONSULT NOTE - Follow Up Consult  Pharmacy Consult for Heparin Indication: pulmonary embolus  Allergies  Allergen Reactions  . Codeine Nausea And Vomiting  . Gabapentin Swelling    Caused Bilateral feet swelling  . Latex Other (See Comments)    Burns the skin  . Morphine Sulfate Nausea And Vomiting  . Sertraline Hcl     Unknown reaction  . Penicillins Other (See Comments)    Has patient had a PCN reaction causing immediate rash, facial/tongue/throat swelling, SOB or lightheadedness with hypotension: Unknown Has patient had a PCN reaction causing severe rash involving mucus membranes or skin necrosis: Unknown Has patient had a PCN reaction that required hospitalization: Unknown Has patient had a PCN reaction occurring within the last 10 years: Unknown If all of the above answers are "NO", then may proceed with Cephalosporin use.     Patient Measurements: Height: 5\' 6"  (167.6 cm) Weight: 250 lb (113.4 kg) IBW/kg (Calculated) : 59.3 Heparin Dosing Weight:   Vital Signs: Temp: 97.9 F (36.6 C) (11/05 2125) Temp Source: Oral (11/05 2125) BP: 154/68 (11/05 2125) Pulse Rate: 74 (11/05 2125)  Labs: Recent Labs    01/20/18 0100 01/20/18 0758 01/21/18 0154  HGB 10.8* 10.1* 10.7*  HCT 34.7* 32.4* 35.2*  PLT 216 205 221  HEPARINUNFRC  --   --  0.86*  CREATININE 1.08* 1.02* 0.99  CKTOTAL 43  --   --   TROPONINI <0.03 <0.03  --     Estimated Creatinine Clearance: 56.9 mL/min (by C-G formula based on SCr of 0.99 mg/dL).   Medications:  Infusions:  . heparin 1,250 Units/hr (01/21/18 0323)    Assessment: Patient with high heparin level.  No heparin issues per RN.  Goal of Therapy:  Heparin level 0.3-0.7 units/ml Monitor platelets by anticoagulation protocol: Yes   Plan:  Decrease heparin to 1250 units/hr Recheck level at [address], [place] 01/21/2018,3:24 AM

## 2018-01-21 NOTE — Progress Notes (Signed)
  Echocardiogram 2D Echocardiogram has been performed.  Janalyn Harder 01/21/2018, 9:20 AM

## 2018-01-21 NOTE — Clinical Social Work Note (Signed)
Clinical Social Work Assessment  Patient Details  Name: [name] MRN: [id number] Date of Birth: 09-Feb-1934  Date of referral:  01/21/18               Reason for consult:  Discharge Planning, Facility Placement                Permission sought to share information with:  Family Supports Permission granted to share information::  Yes, Verbal Permission Granted  Name::     [name]  Agency::     Relationship::  daughter  Contact Information:  [phone]  Housing/Transportation Living arrangements for the past 2 months:  [name] of Information:  Patient Patient Interpreter Needed:  None Criminal Activity/Legal Involvement Pertinent to Current Situation/Hospitalization:    Significant Relationships:  Adult Children Lives with:  Adult Children Do you feel safe going back to the place where you live?    Need for family participation in patient care:     Care giving concerns:  No family at bedside. Patient lives at home with daughter and has support from other family members   Social Worker assessment / plan:  CSW met patient at bedside to discuss discharge plans and offer support. Patient stated 5 days prior to being admitted into [facility] she was at a SNF for rehab. Patient stated she does not want to go to rehab but would prefer to go home with home health. CSW will inform RNCM on floor and MD  Employment status:  Retired Nurse, adult PT Recommendations:  [place] / Referral to community resources:  [place]  Patient/Family's Response to care:  Patient verbalized appreciation for her care  Patient/Family's Understanding of and Emotional Response to Diagnosis, Current Treatment, and Prognosis:  Patient refusing rehab  Emotional Assessment Appearance:  Appears stated age Attitude/Demeanor/Rapport:  Engaged Affect (typically observed):  Accepting Orientation:    Alcohol / Substance  use:  Not Applicable Psych involvement (Current and /or in the community):  No (Comment)  Discharge Needs  Concerns to be addressed:  Care Coordination Readmission within the last 30 days:  No Current discharge risk:  Physical Impairment, Dependent with Mobility Barriers to Discharge:  Continued Medical Work up   [employer], LCSW 01/21/2018, 2:30 PM

## 2018-01-22 LAB — CBC
HCT: 31.9 % — ABNORMAL LOW (ref 36.0–46.0)
Hemoglobin: 9.8 g/dL — ABNORMAL LOW (ref 12.0–15.0)
MCH: 27.7 pg (ref 26.0–34.0)
MCHC: 30.7 g/dL (ref 30.0–36.0)
MCV: 90.1 fL (ref 80.0–100.0)
PLATELETS: 194 10*3/uL (ref 150–400)
RBC: 3.54 MIL/uL — ABNORMAL LOW (ref 3.87–5.11)
RDW: 18.4 % — AB (ref 11.5–15.5)
WBC: 6.5 10*3/uL (ref 4.0–10.5)
nRBC: 0 % (ref 0.0–0.2)

## 2018-01-22 LAB — BASIC METABOLIC PANEL
Anion gap: 6 (ref 5–15)
BUN: 12 mg/dL (ref 8–23)
CO2: 28 mmol/L (ref 22–32)
CREATININE: 0.75 mg/dL (ref 0.44–1.00)
Calcium: 8.6 mg/dL — ABNORMAL LOW (ref 8.9–10.3)
Chloride: 108 mmol/L (ref 98–111)
GFR calc Af Amer: >60 mL/min (ref >60)
Glucose, Bld: 98 mg/dL (ref 70–99)
Potassium: 3.7 mmol/L (ref 3.5–5.1)
SODIUM: 142 mmol/L (ref 135–145)

## 2018-01-22 LAB — HEPARIN LEVEL (UNFRACTIONATED): HEPARIN UNFRACTIONATED: 0.59 [IU]/mL (ref 0.30–0.70)

## 2018-01-22 MED ORDER — FOLIC ACID 1 MG PO TABS: 1.0000 mg | ORAL_TABLET | Freq: Every day | ORAL | 2 refills | Status: DC

## 2018-01-22 MED ORDER — ELIQUIS 5 MG VTE STARTER PACK: ORAL_TABLET | ORAL | 0 refills | Status: DC

## 2018-01-22 MED ORDER — APIXABAN 5 MG PO TABS: ORAL_TABLET | ORAL | 0 refills | Status: DC

## 2018-01-22 MED ORDER — APIXABAN 5 MG PO TABS
10.0000 mg | ORAL_TABLET | Freq: Two times a day (BID) | ORAL | Status: DC
  Administered 2018-01-22: 10 mg via ORAL
  Filled 2018-01-22: qty 2

## 2018-01-22 MED ORDER — ENSURE ENLIVE PO LIQD: 237.0000 mL | Freq: Two times a day (BID) | ORAL | 12 refills | Status: AC

## 2018-01-22 NOTE — Progress Notes (Signed)
Physical Therapy Treatment Patient Details Name: [name] MRN: [id number] DOB: 08-04-1933 Today's Date: 01/22/2018    History of Present Illness [name] is a 84 y.o. female with history of Parkinson's disease, hypertension, anemia who was recently admitted for weakness with pneumonia, and went to rehab.  She was home for 3-4 days and experienced weakness and  persistent infiltrate.  Found to have pilat PE.    PT Comments    Patient much improved with activity tolerance from prior session and seems less fearful.  HR max 118, SpO2 93% with ambulation (88-89% at rest).  Reported less SOB with ambulation and able to participate in ADL for toileting with assist and in LE therex.  Family decided to take pt home and seems more appropriate now if has 24 hour assist and follow up PT.    Follow Up Recommendations  Home health PT;Supervision/Assistance - 24 hour(HH aide)     Equipment Recommendations  None recommended by PT    Recommendations for Other Services       Precautions / Restrictions Precautions Precautions: Fall Precaution Comments: monitor sats Restrictions Weight Bearing Restrictions: No    Mobility  Bed Mobility               General bed mobility comments: in recliner  Transfers Overall transfer level: Needs assistance Equipment used: Rolling walker (2 wheeled) Transfers: Sit to/from [employer] Sit to Stand: Min guard Stand pivot transfers: Min guard       General transfer comment: increased time, cues for hand placement; chair to [facility] minguard for safety with walker  Ambulation/Gait Ambulation/Gait assistance: Min guard Gait Distance (Feet): 50 Feet Assistive device: Rolling walker (2 wheeled) Gait Pattern/deviations: Step-to pattern;Step-through pattern;Shuffle;Trunk flexed     General Gait Details: tremors throughout, assist for balance/safety   Stairs             Wheelchair Mobility    Modified Rankin (Stroke  Patients Only)       Balance Overall balance assessment: Needs assistance   Sitting balance-Leahy Scale: Fair     Standing balance support: Bilateral upper extremity supported Standing balance-Leahy Scale: Poor                              Cognition Arousal/Alertness: Awake/alert Behavior During Therapy: WFL for tasks assessed/performed Overall Cognitive Status: No family/caregiver present to determine baseline cognitive functioning                                 General Comments: pt sleepy but arousable. Followed one step commands. Pt not very talkative      Exercises Other Exercises Other Exercises: performed sit<>stand x 3 reps, then 2 reps minguard A    General Comments General comments (skin integrity, edema, etc.): Spo2 88-89% at rest on RA; 93% HR 118 with ambulation      Pertinent Vitals/Pain Pain Assessment: Faces Pain Score: 8  Faces Pain Scale: Hurts little more Pain Location: feet Pain Descriptors / Indicators: Discomfort;Tingling Pain Intervention(s): Monitored during session;Repositioned    Home Living Family/patient expects to be discharged to:: Private residence Living Arrangements: Children           Home Equipment: Environmental consultant - 2 wheels;Walker - 4 wheels;Shower seat      Prior Function        Comments: unsure if pt had ADL assistance: she states she  has as much help as she needs   PT Goals (current goals can now be found in the care plan section) Acute Rehab PT Goals Patient Stated Goal: to get stronger Progress towards PT goals: Progressing toward goals    Frequency    Min 3X/week      PT Plan Discharge plan needs to be updated;Frequency needs to be updated    Co-evaluation              AM-PAC PT "6 Clicks" Daily Activity  Outcome Measure  Difficulty turning over in bed (including adjusting bedclothes, sheets and blankets)?: Unable Difficulty moving from lying on back to sitting on the side of  the bed? : Unable Difficulty sitting down on and standing up from a chair with arms (e.g., wheelchair, bedside commode, etc,.)?: Unable Help needed moving to and from a bed to chair (including a wheelchair)?: A Little Help needed walking in hospital room?: A Little   6 Click Score: 9    End of Session Equipment Utilized During Treatment: Gait belt Activity Tolerance: Patient tolerated treatment well Patient left: with call bell/phone within reach;in chair   PT Visit Diagnosis: Other abnormalities of gait and mobility (R26.89);Muscle weakness (generalized) (M62.81)     Time: 9604-5409 PT Time Calculation (min) (ACUTE ONLY): 20 min  Charges:  $Gait Training: 8-22 mins                     Sheran Lawless, [facility] [phone] 01/22/2018    Elray Mcgregor 01/22/2018, 12:18 PM

## 2018-01-22 NOTE — Discharge Instructions (Signed)
Information on my medicine - ELIQUIS (apixaban)  This medication education was reviewed with me or my healthcare representative as part of my discharge preparation.  The pharmacist that spoke with me during my hospital stay was:  Koleen Nimrod, Student-PharmD  Why was Eliquis prescribed for you? Eliquis was prescribed to treat blood clots that may have been found in the veins of your legs (deep vein thrombosis) or in your lungs (pulmonary embolism) and to reduce the risk of them occurring again.  What do You need to know about Eliquis ? The starting dose is 10 mg (two 5 mg tablets) taken TWICE daily for the FIRST SEVEN (7) DAYS, then on (enter date)  01/29/2018  the dose is reduced to ONE 5 mg tablet taken TWICE daily.  Eliquis may be taken with or without food.   Try to take the dose about the same time in the morning and in the evening. If you have difficulty swallowing the tablet whole please discuss with your pharmacist how to take the medication safely.  Take Eliquis exactly as prescribed and DO NOT stop taking Eliquis without talking to the doctor who prescribed the medication.  Stopping may increase your risk of developing a new blood clot.  Refill your prescription before you run out.  After discharge, you should have regular check-up appointments with your healthcare provider that is prescribing your Eliquis.    What do you do if you miss a dose? If a dose of ELIQUIS is not taken at the scheduled time, take it as soon as possible on the same day and twice-daily administration should be resumed. The dose should not be doubled to make up for a missed dose.  Important Safety Information A possible side effect of Eliquis is bleeding. You should call your healthcare provider right away if you experience any of the following: ? Bleeding from an injury or your nose that does not stop. ? Unusual colored urine (red or dark brown) or unusual colored stools (red or  black). ? Unusual bruising for unknown reasons. ? A serious fall or if you hit your head (even if there is no bleeding).  Some medicines may interact with Eliquis and might increase your risk of bleeding or clotting while on Eliquis. To help avoid this, consult your healthcare provider or pharmacist prior to using any new prescription or non-prescription medications, including herbals, vitamins, non-steroidal anti-inflammatory drugs (NSAIDs) and supplements.  This website has more information on Eliquis (apixaban): http://www.eliquis.com/eliquis/home

## 2018-01-22 NOTE — Progress Notes (Signed)
Pt discharged from the unit via wheelchair. Discharge instructions were reviewed with pt and family members at bedside. No questions or concerns at this time.

## 2018-01-22 NOTE — Progress Notes (Signed)
ANTICOAGULATION CONSULT NOTE   Pharmacy Consult for Heparin Indication: pulmonary embolus  Allergies  Allergen Reactions  . Codeine Nausea And Vomiting  . Gabapentin Swelling    Caused Bilateral feet swelling  . Latex Other (See Comments)    Burns the skin  . Morphine Sulfate Nausea And Vomiting  . Sertraline Hcl     Unknown reaction  . Penicillins Other (See Comments)    Has patient had a PCN reaction causing immediate rash, facial/tongue/throat swelling, SOB or lightheadedness with hypotension: Unknown Has patient had a PCN reaction causing severe rash involving mucus membranes or skin necrosis: Unknown Has patient had a PCN reaction that required hospitalization: Unknown Has patient had a PCN reaction occurring within the last 10 years: Unknown If all of the above answers are "NO", then may proceed with Cephalosporin use.    Patient Measurements: Height: 5\' 6"  (167.6 cm) Weight: 247 lb 5.7 oz (112.2 kg) IBW/kg (Calculated) : 59.3 Heparin Dosing Weight: 85 kg  Vital Signs: Temp: 98.4 F (36.9 C) (11/07 0509) Temp Source: Oral (11/07 0509) BP: 145/68 (11/07 0509) Pulse Rate: 65 (11/07 0509)  Labs: Recent Labs    01/20/18 0100 01/20/18 0758  01/21/18 0154 01/21/18 1204 01/21/18 2000 01/22/18 0436  HGB 10.8* 10.1*  --  10.7*  --   --  9.8*  HCT 34.7* 32.4*  --  35.2*  --   --  31.9*  PLT 216 205  --  221  --   --  194  HEPARINUNFRC  --   --    < > 0.86* 0.57 0.65 0.59  CREATININE 1.08* 1.02*  --  0.99  --   --  0.75  CKTOTAL 43  --   --   --   --   --   --   TROPONINI <0.03 <0.03  --   --   --   --   --    < > = values in this interval not displayed.   Estimated Creatinine Clearance: 70.1 mL/min (by C-G formula based on SCr of 0.75 mg/dL).  Medical History: Past Medical History:  Diagnosis Date  . Allergic drug reaction    Opioids  . 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 Back pain, thoracic   . Benign  familial tremor   . Bruise   . Calf pain   . Chest pain    -- Myoview negative 2007  . Chronic pain syndrome   . Depression   . Edema   . Gallstones   . Hypertension   . Hypokalemia   . Meniscus tear   . Myalgia   . Osteoarthritis   . Other symptoms referable to lower leg joint   . Other urinary incontinence   . Scoliosis   . Skin rash   . Tinea corporis    Medications:   No oral anticoagulation PTA  Upon admission, patient was ordered Lovenox 40mg  sq q24h (last dose administered 01/20/2018 @ 01:53)  Assessment: 84 yr female with recent diagnosis of PNA presents with SOB and neuropathy in hands and feet.  PMH significant for Parkinson's Disease, HTN, and anemia. CT head on 11/4 = negative for bleed. Dopplers on 11/5 = indeterminate DVT of right popliteal vein; no left DVT. CTAngio on 11/5 = + bilateral pulmonary embolism with evidence of right heart strain. Pharmacy consulted to dose IV heparin  Today, 01/22/18  Heparin level 0.59 units/ml 1250 units/hr  CBC: Hgb decreased 9.8, Plts  WNL  Stable SCr  No reported bleeding or problems with IV site  Goal of Therapy:  Heparin level 0.3-0.7 units/ml Monitor platelets by anticoagulation protocol: Yes   Plan:   Continue Heparin at 1250 units/hr  Daily Heparin level, daily CBC  Loralee Pacas, PharmD, BCPS Pager: [phone] 01/22/2018, 7:20 AM

## 2018-01-22 NOTE — Evaluation (Signed)
Occupational Therapy Evaluation Patient Details Name: [name] MRN: [id number] DOB: 28-Jan-1934 Today's Date: 01/22/2018    History of Present Illness [name] is a 84 y.o. female with history of Parkinson's disease, hypertension, anemia who was recently admitted for weakness with pneumonia, and went to rehab.  She was home for 3-4 days and experienced weakness and  persistent infiltrate.    Clinical Impression   Pt was admitted for the above.  Pt participated in ADLs this am, but she was very quiet.  Unsure if she had assistance with adls at home since her rehab admission. Will follow in acute setting with the goals listed below, focusing on energy conservation and building activity tolerance    Follow Up Recommendations  Supervision/Assistance - 24 hour;Home health OT (as long as family can manage this)   Equipment Recommendations  3 in 1 bedside commode. Pt has a high commode but fatiques easily   Recommendations for Other Services       Precautions / Restrictions Precautions Precautions: Fall Precaution Comments: monitor sats Restrictions Weight Bearing Restrictions: No      Mobility Bed Mobility               General bed mobility comments: in recliner  Transfers   Equipment used: Rolling walker (2 wheeled)   Sit to Stand: Min assist         General transfer comment: from recliner    Balance                                           ADL either performed or assessed with clinical judgement   ADL Overall ADL's : Needs assistance/impaired Eating/Feeding: Set up   Grooming: Oral care;Set up;Sitting   Upper Body Bathing: Set up;Sitting   Lower Body Bathing: Maximal assistance;Sit to/from stand   Upper Body Dressing : Minimal assistance;Sitting   Lower Body Dressing: Sit to/from stand;Total assistance                 General ADL Comments: performed ADL from chair. Pt shaky after a few minutes of standing for  washing peri area. Sat for oral care     Vision         Perception     Praxis      Pertinent Vitals/Pain Pain Assessment: 0-10 Pain Score: 8  Pain Location: tingling in hands and feet Pain Descriptors / Indicators: Tingling Pain Intervention(s): Limited activity within patient's tolerance;Monitored during session     Hand Dominance     Extremity/Trunk Assessment Upper Extremity Assessment Upper Extremity Assessment: Generalized weakness           Communication Communication Communication: No difficulties   Cognition   Behavior During Therapy: WFL for tasks assessed/performed Overall Cognitive Status: No family/caregiver present to determine baseline cognitive functioning                                 General Comments: pt sleepy but arousable. Followed one step commands. Pt not very talkative   General Comments  HR 70s; sats 90% on RA.  Increased WOB during adl; encouraged rest breaks    Exercises     Shoulder Instructions      Home Living Family/patient expects to be discharged to:: Private residence Living Arrangements: Children  Bathroom Shower/Tub: Producer, television/film/video: Handicapped height     Home Equipment: Environmental consultant - 2 wheels;Walker - 4 wheels;Shower seat          Prior Functioning/Environment          Comments: unsure if pt had ADL assistance: she states she has as much help as she needs        OT Problem List: Decreased strength;Decreased activity tolerance;Pain;Decreased knowledge of use of DME or AE;Cardiopulmonary status limiting activity;Impaired balance (sitting and/or standing)      OT Treatment/Interventions: Self-care/ADL training;Energy conservation;DME and/or AE instruction;Patient/family education;Balance training;Therapeutic activities    OT Goals(Current goals can be found in the care plan section) Acute Rehab OT Goals Patient Stated Goal: to get stronger OT Goal  Formulation: With patient Time For Goal Achievement: 02/05/18 Potential to Achieve Goals: Good ADL Goals Pt Will Transfer to Toilet: with min guard assist;ambulating;bedside commode(vs high commode) Pt Will Perform Toileting - Clothing Manipulation and hygiene: with min assist;sit to/from stand Additional ADL Goal #1: pt will perform LB adls with mod A sit to stand Additional ADL Goal #2: pt will tolerate 30 minutes of activity with 3 rest breaks  OT Frequency: Min 2X/week   Barriers to D/C:            Co-evaluation              AM-PAC PT "6 Clicks" Daily Activity     Outcome Measure Help from another person eating meals?: A Little Help from another person taking care of personal grooming?: A Little Help from another person toileting, which includes using toliet, bedpan, or urinal?: A Lot Help from another person bathing (including washing, rinsing, drying)?: A Lot Help from another person to put on and taking off regular upper body clothing?: A Little Help from another person to put on and taking off regular lower body clothing?: Total 6 Click Score: 14   End of Session    Activity Tolerance: Patient limited by fatigue Patient left: in chair;with call bell/phone within reach;with chair alarm set  OT Visit Diagnosis: Muscle weakness (generalized) (M62.81)                Time: 9147-8295 OT Time Calculation (min): 19 min Charges:  OT General Charges $OT Visit: 1 Visit OT Evaluation $OT Eval Low Complexity: 1 Low  [name], OTR/L Acute Rehabilitation Services [phone] [facility] pager [phone] office 01/22/2018  [name] 01/22/2018, 11:02 AM

## 2018-01-22 NOTE — Discharge Summary (Signed)
Physician Discharge Summary  HORTENSE CANTRALL ZHY:[id number] DOB: 26-Jan-1934 DOA: 01/19/2018  PCP: Donato Schultz, DO  Admit date: 01/19/2018 Discharge date: 01/22/2018  Admitted From: Home  Disposition:  Home    Recommendations for Outpatient Follow-up:  1. Follow up with PCP in 1-2 weeks 2. Please repeat echocardiogram in 3-12 months, to re-evaluate for RHF or pulmonary hypertension   Home Health: Yes  Equipment/Devices: Walker  Discharge Condition: Fair  CODE STATUS: FULL   Brief/Interim Summary: Mrs. Teegarden is an 84 y.o. F with Parkinson's disease, HTN, anemia, and recently admitted for community acquired pneumonia who presents with several days of progressive weakness,now progressed to severedyspnea on exertion. She has had no chest pain, hemoptysis, obvious leg swelling. No personal history of DVT or PE. No fever, sputum production. In the emergency room, she had a clear chest x-ray, no fever or leukocytosis. She her creatinine was slightly elevated from baseline, so she was admitted for observation, IV fluids for presumed dehydration.  On further evaluation, she was found to have bilateral PE.     PRINCIPAL HOSPITAL DIAGNOSIS: Pulmonary embolism    Discharge Diagnoses:  Acute pulmonary embolism Patient admitted with fatigue.  Noted after admission to have elevated d-dimer, hypoxia with exertion.  Follow up CTA chest showed bilateral PE, RV/LV ratio 1.0.  PESI 101 due to age, hypoxia.  Started on heparin.  Echocardiogram showed notable increased RV pressure, but normal RV function.  Hemodynamically stable.  On day of discharge, transitioned to Eliquis, able to ambulate without hypoxia.    First time VTE, provoked.  Treat for 6 months, re-evaluate for extended anticoagulation per patient preferences.    MUST STOP PRIMIDONE while on Eliquis.     Acute kidney injury Creatinine normalized with fluids.  ARB restarted at discharge.  Hypertensive  urgency Essential hypertension BP severely elevated at admission, normalized with home medications.   Anemia of chronic disease Hemoglobin stable from baseline.  Parkinson's disease Well controlled  Prolonged QTC QTc normalized during admission.  Depression  Folate deficiency Noted to be folate deficient.  Started on supplement.  Moderate protein calorie malnutrition As defined by poor PO intake over last month, loss of subcutaneous muscle.              Discharge Instructions  Discharge Instructions    Diet - low sodium heart healthy   Complete by:  As directed    Discharge instructions   Complete by:  As directed    From Dr. Maryfrances Bunnell: You were admitted with fatigue and feeling out of breath. We discovered that this was from pulmonary embolism. We started you on a blood thinner (heparin) and have transitioned to a pill form of blood thinner.  Apixaban (Eliquis) has a "loading period" for one week: Take Eliquis 10 mg twice daily for 1 week, then continue with just Eliquis 5 mg twice daily Take Eliquis 5 mg twice daily for 6 months  After that, because this was your first DVT or PE, you can safely stop. Watch for blood in stool or "black tarry" stool and report this to your doctor IMMEDIATELY.  IMPORTANT: Eliquis interacts with primidone. Because primidone is a symptom-relief medicine (and not an essential medicine like Sinemet), you should stop it. DO NOT TAKE PRIMIDONE until you are done taking Eliquis, and then you can resume. Primidone would cause Eliquis to not work well, and might even be as if you weren't taking ELiquis at all.  Call Dr. Zola Button for a follow up appointment in  1 week. I have asked her to arrange for you to have a follow up echocadiogram (ultrasound heart) in 3-6 months to make sure your heart squeeze is not worsening.   Our nutritionist here recommended that you take Ensure, once or twice daily. While you were here, also, we  noticed that your folic acid level was low.  You should take either a multivitamin that contains folate or a folic acid supplement 1mg  daily.   Increase activity slowly   Complete by:  As directed      Allergies as of 01/22/2018      Reactions   Codeine Nausea And Vomiting   Gabapentin Swelling   Caused Bilateral feet swelling   Latex Other (See Comments)   Burns the skin   Morphine Sulfate Nausea And Vomiting   Sertraline Hcl    Unknown reaction   Penicillins Other (See Comments)   Has patient had a PCN reaction causing immediate rash, facial/tongue/throat swelling, SOB or lightheadedness with hypotension: Unknown Has patient had a PCN reaction causing severe rash involving mucus membranes or skin necrosis: Unknown Has patient had a PCN reaction that required hospitalization: Unknown Has patient had a PCN reaction occurring within the last 10 years: Unknown If all of the above answers are "NO", then may proceed with Cephalosporin use.      Medication List    STOP taking these medications   azithromycin 250 MG tablet Commonly known as:  ZITHROMAX   cefpodoxime 200 MG tablet Commonly known as:  VANTIN   cyanocobalamin 1000 MCG/ML injection Commonly known as:  (VITAMIN B-12)   levofloxacin 500 MG tablet Commonly known as:  LEVAQUIN   primidone 50 MG tablet Commonly known as:  MYSOLINE     TAKE these medications   amantadine 100 MG capsule Commonly known as:  SYMMETREL Take 1 capsule (100 mg total) by mouth 2 (two) times daily. What changed:  when to take this   amLODipine-valsartan 10-320 MG tablet Commonly known as:  EXFORGE TAKE 1 TABLET BY MOUTH EVERY DAY   apixaban 5 MG Tabs tablet Commonly known as:  ELIQUIS Take 10 mg twice daily for 1 week then take 5 mg twice daily   carbidopa-levodopa 25-100 MG tablet Commonly known as:  SINEMET IR Take 4 tablets by mouth 4 (four) times daily.   desoximetasone 0.05 % cream Commonly known as:  TOPICORT APPLY TO  AFFECTED AREA TWICE A DAY What changed:  See the new instructions.   famotidine 20 MG tablet Commonly known as:  PEPCID Take 20 mg by mouth 2 (two) times daily.   feeding supplement (ENSURE ENLIVE) Liqd Take 237 mLs by mouth 2 (two) times daily between meals.   FLUoxetine 40 MG capsule Commonly known as:  PROZAC TAKE 1 CAPSULE BY MOUTH EVERY DAY   folic acid 1 MG tablet Commonly known as:  FOLVITE Take 1 tablet (1 mg total) by mouth daily. Start taking on:  01/23/2018   OLANZapine 10 MG tablet Commonly known as:  ZYPREXA TAKE 1 TABLET BY MOUTH EVERY DAY   pantoprazole 40 MG tablet Commonly known as:  PROTONIX Take 1 tablet (40 mg total) by mouth 2 (two) times daily before a meal.   pravastatin 20 MG tablet Commonly known as:  PRAVACHOL TAKE 1 TABLET BY MOUTH ONCE DAILY   Vitamin D (Ergocalciferol) 1.25 MG (50000 UT) Caps capsule Commonly known as:  DRISDOL Take 1 capsule (50,000 Units total) by mouth every 7 (seven) days.   Vitamin D-3 25 MCG (  1000 UT) Caps Take 1 capsule (1,000 Units total) by mouth daily.       Allergies  Allergen Reactions  . Codeine Nausea And Vomiting  . Gabapentin Swelling    Caused Bilateral feet swelling  . Latex Other (See Comments)    Burns the skin  . Morphine Sulfate Nausea And Vomiting  . Sertraline Hcl     Unknown reaction  . Penicillins Other (See Comments)    Has patient had a PCN reaction causing immediate rash, facial/tongue/throat swelling, SOB or lightheadedness with hypotension: Unknown Has patient had a PCN reaction causing severe rash involving mucus membranes or skin necrosis: Unknown Has patient had a PCN reaction that required hospitalization: Unknown Has patient had a PCN reaction occurring within the last 10 years: Unknown If all of the above answers are "NO", then may proceed with Cephalosporin use.     Consultations:  None   Procedures/Studies: Dg Chest 2 View  Result Date: 01/19/2018 CLINICAL DATA:   Pneumonia 2 weeks ago. EXAM: CHEST - 2 VIEW COMPARISON:  01/14/2018. FINDINGS: Low lung volumes. The lungs are clear without focal pneumonia, edema, pneumothorax or pleural effusion. Right middle lobe atelectasis or scarring evident. The cardiopericardial silhouette is within normal limits for size. The visualized bony structures of the thorax are intact. Telemetry leads overlie the chest. IMPRESSION: No active cardiopulmonary disease. Electronically Signed   By: Kennith Center M.D.   On: 01/19/2018 20:03   Dg Chest 2 View  Result Date: 01/15/2018 CLINICAL DATA:  History of pneumonia. EXAM: CHEST - 2 VIEW COMPARISON:  12/28/2017. FINDINGS: AP image inverted. Right middle lobe atelectasis/infiltrate again noted, similar in appearance to prior exam. No pleural effusion or pneumothorax. Stable elevation right hemidiaphragm. Stable cardiomegaly. No pulmonary venous congestion. Degenerative change thoracic spine. IMPRESSION: 1. Right middle lobe atelectasis/infiltrate again noted, similar appearance to prior exam. Stable elevation right hemidiaphragm. 2.  Stable cardiomegaly. Chest is unchanged from 12/28/2017. Electronically Signed   By: Maisie Fus  Register   On: 01/15/2018 07:46   Dg Chest 2 View  Result Date: 12/28/2017 CLINICAL DATA:  Difficulty breathing EXAM: CHEST - 2 VIEW COMPARISON:  02/28/2017 FINDINGS: Cardiac shadow is mildly enlarged but accentuated by the frontal technique. The lungs are well aerated bilaterally. Right middle lobe infiltrate is seen. No sizable effusion is noted. No bony abnormality is seen. IMPRESSION: Mild right middle lobe infiltrate. Electronically Signed   By: Alcide Clever M.D.   On: 12/28/2017 12:49   Ct Head Wo Contrast  Result Date: 01/20/2018 CLINICAL DATA:  Altered mental status EXAM: CT HEAD WITHOUT CONTRAST TECHNIQUE: Contiguous axial images were obtained from the base of the skull through the vertex without intravenous contrast. COMPARISON:  None. FINDINGS: Brain:  There is no mass, hemorrhage or extra-axial collection. The size and configuration of the ventricles and extra-axial CSF spaces are normal. The brain parenchyma is normal, without evidence of acute or chronic infarction. Vascular: No abnormal hyperdensity of the major intracranial arteries or dural venous sinuses. No intracranial atherosclerosis. Skull: The visualized skull base, calvarium and extracranial soft tissues are normal. Sinuses/Orbits: No fluid levels or advanced mucosal thickening of the visualized paranasal sinuses. No mastoid or middle ear effusion. The orbits are normal. IMPRESSION: Normal head CT. Electronically Signed   By: Deatra Robinson M.D.   On: 01/20/2018 00:31   Ct Head Wo Contrast  Result Date: 12/30/2017 CLINICAL DATA:  Syncope.  Fall yesterday.  Hit back of head EXAM: CT HEAD WITHOUT CONTRAST TECHNIQUE: Contiguous axial images were  obtained from the base of the skull through the vertex without intravenous contrast. COMPARISON:  08/25/2006 FINDINGS: Brain: No evidence of acute infarction, hemorrhage, hydrocephalus, extra-axial collection or mass lesion/mass effect. Vascular: No hyperdense vessel or unexpected calcification. Skull: Normal. Negative for fracture or focal lesion. Sinuses/Orbits: No acute finding. Other: None. IMPRESSION: 1. Normal brain.  No acute intracranial abnormalities noted. Electronically Signed   By: Signa Kell M.D.   On: 12/30/2017 11:22   Ct Chest Wo Contrast  Result Date: 01/20/2018 CLINICAL DATA:  Pneumonia for 2 weeks on antibiotics. History of hypertension. EXAM: CT CHEST WITHOUT CONTRAST TECHNIQUE: Multidetector CT imaging of the chest was performed following the standard protocol without IV contrast. COMPARISON:  None. FINDINGS: Cardiovascular: Evaluation of vascular structures is limited on noncontrast imaging. Heart size is normal. No pericardial effusions. Minimal coronary artery calcifications. Normal caliber thoracic aorta with scattered  calcification. Mediastinum/Nodes: Diffuse enlargement of the thyroid gland without focal nodularity. No significant lymphadenopathy in the chest. Esophagus is decompressed. Lungs/Pleura: Lungs are clear. No pleural effusions. No pneumothorax. Airways are patent. Upper Abdomen: Layering density in the gallbladder likely representing small stones. Musculoskeletal: Degenerative changes in the spine. No destructive bone lesions. IMPRESSION: No evidence of active pulmonary disease. Electronically Signed   By: Burman Nieves M.D.   On: 01/20/2018 00:39   Ct Angio Chest Pe W Or Wo Contrast  Result Date: 01/20/2018 CLINICAL DATA:  84 year old female with acute shortness of breath and elevated D-dimer. EXAM: CT ANGIOGRAPHY CHEST WITH CONTRAST TECHNIQUE: Multidetector CT imaging of the chest was performed using the standard protocol during bolus administration of intravenous contrast. Multiplanar CT image reconstructions and MIPs were obtained to evaluate the vascular anatomy. CONTRAST:  ISOVUE-370 IOPAMIDOL (ISOVUE-370) INJECTION 76% COMPARISON:  01/19/2018 noncontrast chest CT. FINDINGS: Cardiovascular:  This is a technically satisfactory study. Pulmonary emboli are identified within the distal RIGHT and LEFT main pulmonary arteries extending into the majority of the segmental pulmonary arteries bilaterally. RV/LV ratio measures 1.0 and compatible with RIGHT heart strain. Mild aortic atherosclerotic calcifications noted without evidence of thoracic aortic aneurysm. No pericardial effusion. Mediastinum/Nodes: No enlarged mediastinal, hilar, or axillary lymph nodes. Thyroid gland, trachea, and esophagus demonstrate no significant findings. Lungs/Pleura: Minimal bibasilar atelectasis noted. No airspace disease, consolidation, nodule, mass, pleural effusion or pneumothorax. Upper Abdomen: No acute abnormality Musculoskeletal: No acute or suspicious bony abnormality. Review of the MIP images confirms the above  findings. IMPRESSION: 1. Bilateral pulmonary emboli with CT evidence of RIGHT heart strain. 2.  Aortic Atherosclerosis (ICD10-I70.0). Critical Value/emergent results were called by telephone at the time of interpretation on 01/20/2018 at 4:17 pm to [facility] ,nurse for this patient, who verbally acknowledged these results. Electronically Signed   By: Harmon Pier M.D.   On: 01/20/2018 16:21   Vas [place] Lower Extremity Venous (dvt)  Result Date: 01/20/2018  Lower Venous Study Indications: Swelling, and Edema.  Limitations: Body habitus and poor ultrasound/tissue interface. Performing Technologist: Blanch Media  Examination Guidelines: A complete evaluation includes B-mode imaging, spectral Doppler, color Doppler, and power Doppler as needed of all accessible portions of each vessel. Bilateral testing is considered an integral part of a complete examination. Limited examinations for reoccurring indications may be performed as noted.  Right Venous Findings: +---------+---------------+---------+-----------+----------+--------------+          CompressibilityPhasicitySpontaneityPropertiesSummary        +---------+---------------+---------+-----------+----------+--------------+ CFV      Full           Yes      Yes                                 +---------+---------------+---------+-----------+----------+--------------+  SFJ      Full                                                        +---------+---------------+---------+-----------+----------+--------------+ FV Prox  Full                                                        +---------+---------------+---------+-----------+----------+--------------+ FV Mid   Full                                                        +---------+---------------+---------+-----------+----------+--------------+ FV Distal                                             not visualized  +---------+---------------+---------+-----------+----------+--------------+ PFV      Full                                                        +---------+---------------+---------+-----------+----------+--------------+ POP      None           No       No                                  +---------+---------------+---------+-----------+----------+--------------+ PTV      Full                                                        +---------+---------------+---------+-----------+----------+--------------+ PERO                                                  not visualized +---------+---------------+---------+-----------+----------+--------------+  Left Venous Findings: +---------+---------------+---------+-----------+----------+--------------+          CompressibilityPhasicitySpontaneityPropertiesSummary        +---------+---------------+---------+-----------+----------+--------------+ CFV      Full           Yes      Yes                                 +---------+---------------+---------+-----------+----------+--------------+ SFJ      Full                                                        +---------+---------------+---------+-----------+----------+--------------+  FV Prox  Full                                                        +---------+---------------+---------+-----------+----------+--------------+ FV Mid   Full                                                        +---------+---------------+---------+-----------+----------+--------------+ FV Distal                                             not visualized +---------+---------------+---------+-----------+----------+--------------+ PFV      Full                                                        +---------+---------------+---------+-----------+----------+--------------+ POP      Full           Yes      Yes                                  +---------+---------------+---------+-----------+----------+--------------+ PTV      Full                                                        +---------+---------------+---------+-----------+----------+--------------+ PERO                                                  not visualized +---------+---------------+---------+-----------+----------+--------------+    Summary: Right: Findings consistent with age indeterminate deep vein thrombosis involving the right popliteal vein. Portions of this examination were limited- see technologist comments above. No cystic structure found in the popliteal fossa. Left: There is no evidence of deep vein thrombosis in the lower extremity. However, portions of this examination were limited- see technologist comments above. No cystic structure found in the popliteal fossa.  *See table(s) above for measurements and observations. Electronically signed by Coral Else MD on 01/20/2018 at 6:53:43 PM.    Final    Echocardiogram Study Conclusions  - Left ventricle: The cavity size was normal. Wall thickness was   increased in a pattern of mild LVH. Systolic function was normal.   The estimated ejection fraction was in the range of 55% to 60%.   Wall motion was normal; there were no regional wall motion   abnormalities. Doppler parameters are consistent with abnormal   left ventricular relaxation (grade 1 diastolic dysfunction). - Ventricular septum: Mildly D-shaped interventricular septum   suggestive of RV pressure/volume overload. - Aortic valve: There was no stenosis. - Mitral valve: There was no  significant regurgitation. - Right ventricle: The cavity size was mildly dilated. Systolic   function was normal. - Tricuspid valve: Peak RV-RA gradient (S): 50 mm Hg. - Pulmonary arteries: PA peak pressure: 58 mm Hg (S). - Systemic veins: IVC measured 2.6 cm with normal respirophasic   variation, suggesting RA pressure 8 mmHg.  Impressions:  -  Normal LV size with mild LV hypertrophy. EF 55-60%. D-shaped   interventricular septum suggesting RV pressure/volume overload.   Mildly dilated RV with normal systolic function. Moderate   pulmonary hypertension.    Subjective: Very tired, but no dyspnea at rest, no chest pain.  No confusion, no fever, or cough.    Discharge Exam: Vitals:   01/22/18 0509 01/22/18 0906  BP: (!) 145/68 (!) 147/84  Pulse: 65   Resp: 18   Temp: 98.4 F (36.9 C)   SpO2: 93%    Vitals:   01/21/18 2123 01/22/18 0509 01/22/18 0512 01/22/18 0906  BP: (!) 143/74 (!) 145/68  (!) 147/84  Pulse: 65 65    Resp: 18 18    Temp: 99.1 F (37.3 C) 98.4 F (36.9 C)    TempSrc: Oral Oral    SpO2: 98% 93%    Weight:   112.2 kg   Height:        General: Pt is alert, awake, not in acute distress, appears tired, sitting in recliner, daughter at bedside Cardiovascular: RRR, nl S1-S2, no murmurs appreciated.   No LE edema.   Respiratory: Normal respiratory rate and rhythm.  CTAB without rales or wheezes. Abdominal: Abdomen soft and non-tender.  No distension or HSM.   Neuro/Psych: Strength symmetric in upper and lower extremities.  Judgment and insight appear normal.  No resting tremor.  Blunted facies, slow psychomotor.   The results of significant diagnostics from this hospitalization (including imaging, microbiology, ancillary and laboratory) are listed below for reference.     Microbiology: Recent Results (from the past 240 hour(s))  Culture, Urine     Status: Abnormal (Preliminary result)   Collection Time: 01/20/18 11:11 AM  Result Value Ref Range Status   Specimen Description   Final    URINE, CLEAN CATCH Performed at [facility], [address], [place], [place] [address]    Special Requests   Final    NONE Performed at [facility], [address] [place], [place] [address]    Culture >=100,000 COLONIES/mL ENTEROCOCCUS FAECALIS (A)  Final   Report Status  PENDING  Incomplete     Labs: BNP (last 3 results) Recent Labs    12/28/17 1217 01/20/18 0758  BNP 27.0 29.0   Basic Metabolic Panel: Recent Labs  Lab 01/19/18 2010 01/20/18 0100 01/20/18 0758 01/21/18 0154 01/22/18 0436  NA 141  --  141 142 142  K 3.5  --  3.0* 3.5 3.7  CL 103  --  105 106 108  CO2 25  --  26 26 28   GLUCOSE 94  --  84 93 98  BUN 18  --  16 15 12   CREATININE 1.30* 1.08* 1.02* 0.99 0.75  CALCIUM 8.9  --  8.3* 8.6* 8.6*  MG  --  1.8  --   --   --    Liver Function Tests: Recent Labs  Lab 01/19/18 2010  AST 8*  ALT <5  ALKPHOS 89  BILITOT 0.9  PROT 6.8  ALBUMIN 3.7   No results for input(s): LIPASE, AMYLASE in the last 168 hours. No results for  input(s): AMMONIA in the last 168 hours. CBC: Recent Labs  Lab 01/19/18 2010 01/20/18 0100 01/20/18 0758 01/21/18 0154 01/22/18 0436  WBC 6.8 5.9 5.4 6.6 6.5  NEUTROABS 4.1  --   --   --   --   HGB 11.1* 10.8* 10.1* 10.7* 9.8*  HCT 35.6* 34.7* 32.4* 35.2* 31.9*  MCV 88.8 89.7 89.8 89.3 90.1  PLT 209 216 205 221 194   Cardiac Enzymes: Recent Labs  Lab 01/20/18 0100 01/20/18 0758  CKTOTAL 43  --   TROPONINI <0.03 <0.03   BNP: Invalid input(s): POCBNP CBG: No results for input(s): GLUCAP in the last 168 hours. D-Dimer Recent Labs    01/20/18 0100  DDIMER 11.90*   Hgb A1c No results for input(s): HGBA1C in the last 72 hours. Lipid Profile No results for input(s): CHOL, HDL, LDLCALC, TRIG, CHOLHDL, LDLDIRECT in the last 72 hours. Thyroid function studies Recent Labs    01/20/18 0100  TSH 1.888   Anemia work up Recent Labs    01/20/18 0100  VITAMINB12 522  FOLATE 1.4*  FERRITIN 81  TIBC 299  IRON 151  RETICCTPCT 0.9   Urinalysis    Component Value Date/Time   COLORURINE YELLOW 01/20/2018 0108   APPEARANCEUR HAZY (A) 01/20/2018 0108   LABSPEC 1.021 01/20/2018 0108   PHURINE 6.0 01/20/2018 0108   GLUCOSEU NEGATIVE 01/20/2018 0108   HGBUR LARGE (A) 01/20/2018 0108    HGBUR negative 02/15/2008 1302   BILIRUBINUR NEGATIVE 01/20/2018 0108   BILIRUBINUR neg 08/18/2015 1459   KETONESUR 20 (A) 01/20/2018 0108   PROTEINUR 100 (A) 01/20/2018 0108   UROBILINOGEN 0.2 08/18/2015 1459   UROBILINOGEN 0.2 08/02/2010 2217   NITRITE NEGATIVE 01/20/2018 0108   LEUKOCYTESUR MODERATE (A) 01/20/2018 0108   Sepsis Labs Invalid input(s): PROCALCITONIN,  WBC,  LACTICIDVEN Microbiology Recent Results (from the past 240 hour(s))  Culture, Urine     Status: Abnormal (Preliminary result)   Collection Time: 01/20/18 11:11 AM  Result Value Ref Range Status   Specimen Description   Final    URINE, CLEAN CATCH Performed at [facility], [address] [place], [place] [address]    Special Requests   Final    NONE Performed at [facility], [address], [place], [place] [address]    Culture >=100,000 COLONIES/mL ENTEROCOCCUS FAECALIS (A)  Final   Report Status PENDING  Incomplete     Time coordinating discharge: 50 minutes       SIGNED:   Alberteen Sam, MD  [facility] 01/22/2018, 5:04 PM

## 2018-01-22 NOTE — Progress Notes (Signed)
SATURATION QUALIFICATIONS: (This note is used to comply with regulatory documentation for home oxygen)  Patient Saturations on Room Air at Rest = 88% (up to 91% at times)  Patient Saturations on Room Air while Ambulating = 93%  Patient Saturations on N/A Liters of oxygen while Ambulating = N/A%  Please briefly explain why patient needs home oxygen:  No need for home O2, oxygenating well with mobility.  Sheran Lawless, [facility] [phone] 01/22/2018

## 2018-01-23 ENCOUNTER — Other Ambulatory Visit: Payer: Self-pay | Admitting: Family Medicine

## 2018-01-23 ENCOUNTER — Other Ambulatory Visit (INDEPENDENT_AMBULATORY_CARE_PROVIDER_SITE_OTHER): Payer: Self-pay | Admitting: Orthopedic Surgery

## 2018-01-23 ENCOUNTER — Telehealth: Payer: Self-pay

## 2018-01-23 ENCOUNTER — Other Ambulatory Visit: Payer: Self-pay | Admitting: *Deleted

## 2018-01-23 LAB — URINE CULTURE

## 2018-01-23 MED ORDER — TRAZODONE HCL 50 MG PO TABS: 25.0000 mg | ORAL_TABLET | Freq: Every evening | ORAL | 1 refills | Status: DC | PRN

## 2018-01-23 MED ORDER — NITROFURANTOIN MONOHYD MACRO 100 MG PO CAPS: 100.0000 mg | ORAL_CAPSULE | Freq: Two times a day (BID) | ORAL | 0 refills | Status: AC

## 2018-01-23 NOTE — Telephone Encounter (Signed)
This is a JN pt.  

## 2018-01-23 NOTE — Telephone Encounter (Signed)
01/23/18   Transition Care Management Follow-up Telephone Call  ADMISSION DATE: 01/19/18 DISCHARGE DATE: 01/22/18   How have you been since you were released from the hospital? Feeling ok just weak per daughter.   Do you understand why you were in the hospital? Yes   Do you understand the discharge instrcutions? Yes     Items Reviewed:  Medications reviewed: Yes  Allergies reviewed: Yes   Dietary changes reviewed: Low Sodium Heart Healthy  Referrals reviewed: Appointment scheduled  Functional Questionnaire:  Activities of Daily Living (ADLs): Assistance needed with all at this time.  Any patient concerns? Daughter concerned with major issues with numbness in hands and feet.  Confirmed importance and date/time of follow-up visits scheduled:Yes   Confirmed with patient if condition begins to worsen call PCP or go to the ER. Yes     Patient was given the office number and encouragred to call back with questions or concerns. Yes

## 2018-01-23 NOTE — Telephone Encounter (Signed)
Diclofenac refill request 

## 2018-01-25 ENCOUNTER — Encounter (HOSPITAL_COMMUNITY): Payer: Self-pay | Admitting: Emergency Medicine

## 2018-01-25 ENCOUNTER — Emergency Department (HOSPITAL_COMMUNITY)
Admission: EM | Admit: 2018-01-25 | Discharge: 2018-01-25 | Disposition: A | Discharge status: Home / Self Care | Payer: Medicare Other | Attending: Emergency Medicine | Admitting: Emergency Medicine

## 2018-01-25 ENCOUNTER — Emergency Department (HOSPITAL_COMMUNITY): Payer: Medicare Other

## 2018-01-25 DIAGNOSIS — M25522 Pain in left elbow: Secondary | ICD-10-CM

## 2018-01-25 DIAGNOSIS — M79605 Pain in left leg: Secondary | ICD-10-CM

## 2018-01-25 DIAGNOSIS — S79912A Unspecified injury of left hip, initial encounter: Secondary | ICD-10-CM | POA: Insufficient documentation

## 2018-01-25 DIAGNOSIS — S8992XA Unspecified injury of left lower leg, initial encounter: Secondary | ICD-10-CM | POA: Diagnosis not present

## 2018-01-25 DIAGNOSIS — Z79899 Other long term (current) drug therapy: Secondary | ICD-10-CM | POA: Insufficient documentation

## 2018-01-25 DIAGNOSIS — Y92009 Unspecified place in unspecified non-institutional (private) residence as the place of occurrence of the external cause: Secondary | ICD-10-CM | POA: Diagnosis not present

## 2018-01-25 DIAGNOSIS — I10 Essential (primary) hypertension: Secondary | ICD-10-CM | POA: Insufficient documentation

## 2018-01-25 DIAGNOSIS — W19XXXA Unspecified fall, initial encounter: Secondary | ICD-10-CM

## 2018-01-25 DIAGNOSIS — G20 Parkinson's disease: Secondary | ICD-10-CM | POA: Insufficient documentation

## 2018-01-25 DIAGNOSIS — S59902A Unspecified injury of left elbow, initial encounter: Secondary | ICD-10-CM | POA: Diagnosis not present

## 2018-01-25 DIAGNOSIS — W010XXA Fall on same level from slipping, tripping and stumbling without subsequent striking against object, initial encounter: Secondary | ICD-10-CM | POA: Insufficient documentation

## 2018-01-25 DIAGNOSIS — Y998 Other external cause status: Secondary | ICD-10-CM | POA: Insufficient documentation

## 2018-01-25 DIAGNOSIS — Y9301 Activity, walking, marching and hiking: Secondary | ICD-10-CM | POA: Diagnosis not present

## 2018-01-25 MED ORDER — ACETAMINOPHEN 500 MG PO TABS
1000.0000 mg | ORAL_TABLET | Freq: Once | ORAL | Status: AC
  Administered 2018-01-25: 1000 mg via ORAL
  Filled 2018-01-25: qty 2

## 2018-01-25 NOTE — ED Provider Notes (Signed)
[facility]-EMERGENCY DEPT Provider Note   CSN: [id number] Arrival date & time: 01/25/18  0235     History   Chief Complaint Chief Complaint  Patient presents with  . Fall    HPI TIMERA WINDT is a 84 y.o. female.  84 yo F with a chief complaint of a fall.  The patient has difficulty walking due to Parkinson's.  She normally requires assistance for any amount of ambulation.  The patient try to go to the bathroom on her own and she lost her balance and fell.  Fell onto her left side.  Denies head injury denies loss consciousness denies chest pain or abdominal pain.  Denies shortness of breath.  Complaining of pain mostly to the left knee left hip and left elbow.  She was ambulatory after the event.  The history is provided by the patient.  Fall  This is a new problem. The current episode started less than 1 hour ago. The problem occurs rarely. The problem has been resolved. Pertinent negatives include no chest pain, no abdominal pain, no headaches and no shortness of breath. The symptoms are aggravated by bending, twisting and walking. Nothing relieves the symptoms. She has tried nothing for the symptoms. The treatment provided no relief.    Past Medical History:  Diagnosis Date  . Allergic drug reaction    Opioids  . 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 Back pain, thoracic   . Benign familial tremor   . Bruise   . Calf pain   . Chest pain    -- Myoview negative 2007  . Chronic pain syndrome   . Depression   . Edema   . Gallstones   . Hypertension   . Hypokalemia   . Meniscus tear   . Myalgia   . Osteoarthritis   . Other symptoms referable to lower leg joint   . Other urinary incontinence   . Scoliosis   . Skin rash   . Tinea corporis     Patient Active Problem List   Diagnosis Date Noted  . Acute pulmonary embolism (HCC) 01/20/2018  . Hypertensive urgency 01/19/2018  . Generalized weakness  01/19/2018  . ARF (acute renal failure) (HCC) 01/19/2018  . Weakness generalized 01/19/2018  . AKI (acute kidney injury) (HCC)   . Vitamin B 12 deficiency 01/01/2018  . Low vitamin D level 01/01/2018  . Syncope and collapse 12/30/2017  . Nausea and vomiting 12/29/2017  . Prolonged QT interval 12/29/2017  . CAP (community acquired pneumonia) 12/28/2017  . Dyspepsia 09/25/2017  . 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 Toe pain, right 11/17/2016  . Left shoulder pain 01/19/2016  . Hyperlipidemia 05/03/2014  . Disc disorder of lumbar region 12/30/2013  . Obesity (BMI 30-39.9) 07/01/2013  . Biliary dyskinesia 12/10/2011  . UTI (urinary tract infection) 07/24/2011  . Lumbar back pain 06/12/2011  . Symptomatic Parkinson disease (HCC) 02/26/2011  . Depression 09/28/2010  . Pre-operative cardiovascular examination 07/23/2010  . CAROTID BRUIT, RIGHT 06/29/2009  . CHEST PAIN-UNSPECIFIED 01/13/2009  . SKIN RASH 10/11/2008  . EDEMA 08/25/2008  . OTHER URINARY INCONTINENCE 02/15/2008  . CHRONIC PAIN SYNDROME 12/31/2007  . BRUISE 09/14/2007  . SCOLIOSIS, LUMBAR SPINE 06/15/2007  . BACK PAIN, THORACIC REGION, RIGHT 06/11/2007  . MENISCUS TEAR 07/05/202008  . OTHER SYMPTOMS REFERABLE TO LOWER LEG JOINT 02/17/2007  . MYALGIA 02/10/2007  . CALF PAIN, RIGHT 02/10/2007  . DEPRESSION 01/01/2007  . HYPOKALEMIA, HX OF  01/01/2007  . TINEA CORPORIS 09/22/2006  . ALLERGIC DRUG REACTION, OPIOIDS 09/22/2006  . Essential hypertension 08/20/2006  . OSTEOARTHRITIS 08/20/2006    Past Surgical History:  Procedure Laterality Date  . ABDOMINAL HYSTERECTOMY    . BREAST LUMPECTOMY    . KNEE ARTHROSCOPY     Bilateral  . LUMBAR LAMINECTOMY/DECOMPRESSION MICRODISCECTOMY  2007   L3-4,4-5, L5hemilaminectomy with leopard cage internal fiaxation L3-5  . ROTATOR CUFF REPAIR     Right     OB History   None      Home Medications    Prior to Admission medications   Medication Sig Start Date End Date  Taking? Authorizing Provider  amantadine (SYMMETREL) 100 MG capsule Take 1 capsule (100 mg total) by mouth 2 (two) times daily. Patient taking differently: Take 100 mg by mouth at bedtime.  07/01/13   Zola Button, Yvonne R, DO  amLODipine-valsartan (EXFORGE) 10-320 MG tablet TAKE 1 TABLET BY MOUTH EVERY DAY 11/13/17   Zola Button, Grayling Congress, DO  apixaban (ELIQUIS) 5 MG TABS tablet Take 10 mg twice daily for 1 week then take 5 mg twice daily 01/22/18   Danford, Earl Lites, MD  carbidopa-levodopa (SINEMET) 25-100 MG per tablet Take 4 tablets by mouth 4 (four) times daily.  07/14/10   [provider]  Cholecalciferol (VITAMIN D-3) 1000 units CAPS Take 1 capsule (1,000 Units total) by mouth daily. 12/31/17   Donato Schultz, DO  desoximetasone (TOPICORT) 0.05 % cream APPLY TO AFFECTED AREA TWICE A DAY Patient taking differently: Apply 1 application topically 2 (two) times daily.  01/16/16   Seabron Spates R, DO  diclofenac (VOLTAREN) 75 MG EC tablet TAKE 1 TABLET BY MOUTH TWICE DAILY WITH FOOD 01/23/18   Kerrin Champagne, MD  famotidine (PEPCID) 20 MG tablet Take 20 mg by mouth 2 (two) times daily.    [provider]  feeding supplement, ENSURE ENLIVE, (ENSURE ENLIVE) LIQD Take 237 mLs by mouth 2 (two) times daily between meals. 01/22/18   Alberteen Sam, MD  FLUoxetine (PROZAC) 40 MG capsule TAKE 1 CAPSULE BY MOUTH EVERY DAY 09/25/17   Zola Button, Grayling Congress, DO  folic acid (FOLVITE) 1 MG tablet Take 1 tablet (1 mg total) by mouth daily. 01/23/18   Danford, Earl Lites, MD  nitrofurantoin, macrocrystal-monohydrate, (MACROBID) 100 MG capsule Take 1 capsule (100 mg total) by mouth 2 (two) times daily for 7 days. 01/23/18 01/30/18  Seabron Spates R, DO  OLANZapine (ZYPREXA) 10 MG tablet TAKE 1 TABLET BY MOUTH EVERY DAY 09/25/17   Zola Button, Grayling Congress, DO  pantoprazole (PROTONIX) 40 MG tablet Take 1 tablet (40 mg total) by mouth 2 (two) times daily before a meal. 12/23/17    Zola Button, Grayling Congress, DO  pravastatin (PRAVACHOL) 20 MG tablet TAKE 1 TABLET BY MOUTH ONCE DAILY 11/04/17   Zola Button, Grayling Congress, DO  traZODone (DESYREL) 50 MG tablet Take 0.5-1 tablets (25-50 mg total) by mouth at bedtime as needed for sleep. 01/23/18   Donato Schultz, DO  Vitamin D, Ergocalciferol, (DRISDOL) 50000 units CAPS capsule Take 1 capsule (50,000 Units total) by mouth every 7 (seven) days. 12/31/17   Donato Schultz, DO    Family History Family History  Problem Relation Age of Onset  . Stroke Mother   . Parkinsonism Mother     Social History Social History   Tobacco Use  . Smoking status: Never Smoker  . Smokeless tobacco: Never Used  Substance Use Topics  . Alcohol use: No  . Drug use: No     Allergies   Codeine; Gabapentin; Latex; Morphine sulfate; Sertraline hcl; and Penicillins   Review of Systems Review of Systems  Constitutional: Negative for chills and fever.  HENT: Negative for congestion and rhinorrhea.   Eyes: Negative for redness and visual disturbance.  Respiratory: Negative for shortness of breath and wheezing.   Cardiovascular: Negative for chest pain and palpitations.  Gastrointestinal: Negative for abdominal pain, nausea and vomiting.  Genitourinary: Negative for dysuria and urgency.  Musculoskeletal: Negative for arthralgias and myalgias.  Skin: Negative for pallor and wound.  Neurological: Negative for dizziness and headaches.     Physical Exam Updated Vital Signs BP (!) 158/80   Pulse 80   Resp 16   Ht 5\' 6"  (1.676 m)   Wt 112.2 kg   SpO2 98%   BMI 39.92 kg/m   Physical Exam  Constitutional: She is oriented to person, place, and time. She appears well-developed and well-nourished. No distress.  HENT:  Head: Normocephalic and atraumatic.  Eyes: Pupils are equal, round, and reactive to light. EOM are normal.  Neck: Normal range of motion. Neck supple.  Cardiovascular: Normal rate and regular rhythm. Exam reveals no  gallop and no friction rub.  No murmur heard. Pulmonary/Chest: Effort normal. She has no wheezes. She has no rales.  Abdominal: Soft. She exhibits no distension. There is no tenderness.  Musculoskeletal: She exhibits edema. She exhibits no tenderness.  Left lateral leg pain.  Reproduced with palpation to the lateral aspect of the leg.  No significant pain with internal/external rotation.  The leg does appear to be slightly shorter than the other.  Intact pulse and motor.  Decreased sensation to the dorsum of the foot.  Equal to the other side.  She has mild bruising to the left lateral elbow.  She is no bony tenderness and full range of motion of the left shoulder wrist and elbow  Neurological: She is alert and oriented to person, place, and time.  Skin: Skin is warm and dry. She is not diaphoretic.  Psychiatric: She has a normal mood and affect. Her behavior is normal.  Nursing note and vitals reviewed.    ED Treatments / Results  Labs (all labs ordered are listed, but only abnormal results are displayed) Labs Reviewed - No data to display  EKG None  Radiology Dg Knee Complete 4 Views Left  Result Date: 01/25/2018 CLINICAL DATA:  Initial evaluation for acute trauma, fall. EXAM: LEFT KNEE - COMPLETE 4+ VIEW COMPARISON:  None. FINDINGS: Cemented total left knee arthroplasty in place. No periprosthetic lucency to suggest loosening or other complication. No acute fracture or dislocation. No joint effusion. No acute soft tissue abnormality. Heterotopic calcification anterior to the distal left femur suspected to be postsurgical in nature. IMPRESSION: 1. No acute osseous abnormality about the knee. 2. Left total knee arthroplasty in place without complication. Electronically Signed   By: Rise Mu M.D.   On: 01/25/2018 03:40   Dg Hip Unilat W Or Wo Pelvis 2-3 Views Left  Result Date: 01/25/2018 CLINICAL DATA:  Initial evaluation for acute trauma, fall, pain. EXAM: DG HIP (WITH OR  WITHOUT PELVIS) 2-3V LEFT COMPARISON:  None. FINDINGS: No acute fracture dislocation. Femoral heads in normal alignment within the acetabula. Femoral head heights maintained. Bony pelvis intact. Limited views of the right hip demonstrate no acute osseous abnormality. Moderate osteoarthritic changes about the hips bilaterally, right slightly worse than left.  No acute soft tissue abnormality. Fixation hardware partially visualized within the lower lumbar spine. IMPRESSION: 1. No acute fracture or dislocation. 2. Moderate degenerative osteoarthrosis about the hips bilaterally, right slightly worse than left. Electronically Signed   By: Rise Mu M.D.   On: 01/25/2018 03:38    Procedures Procedures (including critical care time)  Medications Ordered in ED Medications  acetaminophen (TYLENOL) tablet 1,000 mg (1,000 mg Oral Given 01/25/18 0307)     Initial Impression / Assessment and Plan / ED Course  I have reviewed the triage vital signs and the nursing notes.  Pertinent labs & imaging results that were available during my care of the patient were reviewed by me and considered in my medical decision making (see chart for details).     84 yo F with a chief complaint of a mechanical fall.  Complaining of pain to the left side of her body.  Plain film of the hip and knee are unremarkable as viewed by me.  Patient was ambulatory without difficulty.  Discharge home.  4:51 AM:  I have discussed the diagnosis/risks/treatment options with the patient and family and believe the pt to be eligible for discharge home to follow-up with PCP. We also discussed returning to the ED immediately if new or worsening sx occur. We discussed the sx which are most concerning (e.g., sudden worsening pain, fever, inability to tolerate by mouth) that necessitate immediate return. Medications administered to the patient during their visit and any new prescriptions provided to the patient are listed  below.  Medications given during this visit Medications  acetaminophen (TYLENOL) tablet 1,000 mg (1,000 mg Oral Given 01/25/18 0307)      The patient appears reasonably screen and/or stabilized for discharge and I doubt any other medical condition or other [facility] requiring further screening, evaluation, or treatment in the ED at this time prior to discharge.    Final Clinical Impressions(s) / ED Diagnoses   Final diagnoses:  Fall, initial encounter  Left leg pain  Left elbow pain    ED Discharge Orders    None       Melene Plan, DO 01/25/18 0451

## 2018-01-25 NOTE — ED Triage Notes (Signed)
-  From home, arrived via [facility] -Patient was getting up to use RR, when she used her assistance handle besides her toilet, her feet slide out from under her and she fell to the ground.  -Patient does not complain of weakness, she thinks she just slipped. -While she was laying on ground, she was initially complaining of R hip pain, then after she was assisted up by EMS she was complaining of L arm and L knee pain when assisted up. R hip pain, 2/10, L knee pain 5/10.   -Hx parkinson's, no blood thinners and bilateral knee replacements  Vitals  -BP 140/72 -P 80 -O2 94

## 2018-01-25 NOTE — ED Notes (Signed)
Patient given fresh chucks and linen, pure-wick re-applied.

## 2018-01-25 NOTE — ED Notes (Signed)
Bed: ZO10 Expected date:  Expected time:  Means of arrival:  Comments: EMS 84 yo female from home fall-trying to go to the bathroom-feet slid underneath here-left knee and hip pain-no deformity

## 2018-01-25 NOTE — ED Notes (Addendum)
Patient ambulated in room with walker. Her gaze was uneven and jerky, but stable (dx with Parkinson's)

## 2018-01-25 NOTE — ED Notes (Addendum)
[facility] called for transport. Papers at bedside.

## 2018-01-27 ENCOUNTER — Emergency Department (HOSPITAL_COMMUNITY): Payer: Medicare Other

## 2018-01-27 ENCOUNTER — Other Ambulatory Visit: Payer: Self-pay

## 2018-01-27 ENCOUNTER — Inpatient Hospital Stay (HOSPITAL_COMMUNITY): Payer: Medicare Other

## 2018-01-27 ENCOUNTER — Observation Stay (HOSPITAL_COMMUNITY)
Admission: EM | Admit: 2018-01-27 | Discharge: 2018-01-30 | Disposition: A | Discharge status: Home / Self Care | Payer: Medicare Other | Attending: Internal Medicine | Admitting: Internal Medicine

## 2018-01-27 DIAGNOSIS — Y92009 Unspecified place in unspecified non-institutional (private) residence as the place of occurrence of the external cause: Secondary | ICD-10-CM | POA: Diagnosis not present

## 2018-01-27 DIAGNOSIS — E785 Hyperlipidemia, unspecified: Secondary | ICD-10-CM | POA: Diagnosis present

## 2018-01-27 DIAGNOSIS — R569 Unspecified convulsions: Secondary | ICD-10-CM | POA: Diagnosis not present

## 2018-01-27 DIAGNOSIS — Z880 Allergy status to penicillin: Secondary | ICD-10-CM | POA: Insufficient documentation

## 2018-01-27 DIAGNOSIS — G20 Parkinson's disease: Secondary | ICD-10-CM | POA: Diagnosis not present

## 2018-01-27 DIAGNOSIS — I10 Essential (primary) hypertension: Secondary | ICD-10-CM | POA: Diagnosis present

## 2018-01-27 DIAGNOSIS — Z86711 Personal history of pulmonary embolism: Secondary | ICD-10-CM | POA: Diagnosis not present

## 2018-01-27 DIAGNOSIS — E872 Acidosis, unspecified: Secondary | ICD-10-CM | POA: Diagnosis present

## 2018-01-27 DIAGNOSIS — I509 Heart failure, unspecified: Secondary | ICD-10-CM | POA: Diagnosis not present

## 2018-01-27 DIAGNOSIS — G20A1 Parkinson's disease without dyskinesia, without mention of fluctuations: Secondary | ICD-10-CM

## 2018-01-27 DIAGNOSIS — Y9301 Activity, walking, marching and hiking: Secondary | ICD-10-CM | POA: Diagnosis not present

## 2018-01-27 DIAGNOSIS — Z87440 Personal history of urinary (tract) infections: Secondary | ICD-10-CM | POA: Diagnosis not present

## 2018-01-27 DIAGNOSIS — E860 Dehydration: Secondary | ICD-10-CM | POA: Diagnosis not present

## 2018-01-27 DIAGNOSIS — M5031 Other cervical disc degeneration,  high cervical region: Secondary | ICD-10-CM | POA: Diagnosis not present

## 2018-01-27 DIAGNOSIS — Z7901 Long term (current) use of anticoagulants: Secondary | ICD-10-CM | POA: Insufficient documentation

## 2018-01-27 DIAGNOSIS — Z91040 Latex allergy status: Secondary | ICD-10-CM | POA: Diagnosis not present

## 2018-01-27 DIAGNOSIS — F329 Major depressive disorder, single episode, unspecified: Secondary | ICD-10-CM | POA: Diagnosis not present

## 2018-01-27 DIAGNOSIS — Z96652 Presence of left artificial knee joint: Secondary | ICD-10-CM | POA: Insufficient documentation

## 2018-01-27 DIAGNOSIS — I2699 Other pulmonary embolism without acute cor pulmonale: Secondary | ICD-10-CM | POA: Diagnosis not present

## 2018-01-27 DIAGNOSIS — Z79899 Other long term (current) drug therapy: Secondary | ICD-10-CM | POA: Insufficient documentation

## 2018-01-27 DIAGNOSIS — I2782 Chronic pulmonary embolism: Secondary | ICD-10-CM

## 2018-01-27 DIAGNOSIS — W19XXXA Unspecified fall, initial encounter: Secondary | ICD-10-CM | POA: Diagnosis not present

## 2018-01-27 DIAGNOSIS — I672 Cerebral atherosclerosis: Secondary | ICD-10-CM | POA: Insufficient documentation

## 2018-01-27 DIAGNOSIS — K219 Gastro-esophageal reflux disease without esophagitis: Secondary | ICD-10-CM | POA: Diagnosis present

## 2018-01-27 DIAGNOSIS — M2578 Osteophyte, vertebrae: Secondary | ICD-10-CM | POA: Insufficient documentation

## 2018-01-27 DIAGNOSIS — R55 Syncope and collapse: Secondary | ICD-10-CM | POA: Diagnosis present

## 2018-01-27 DIAGNOSIS — I110 Hypertensive heart disease with heart failure: Secondary | ICD-10-CM | POA: Insufficient documentation

## 2018-01-27 DIAGNOSIS — Z885 Allergy status to narcotic agent status: Secondary | ICD-10-CM | POA: Insufficient documentation

## 2018-01-27 DIAGNOSIS — Z888 Allergy status to other drugs, medicaments and biological substances status: Secondary | ICD-10-CM | POA: Diagnosis not present

## 2018-01-27 HISTORY — DX: Unspecified urinary incontinence: R32

## 2018-01-27 HISTORY — DX: Nausea with vomiting, unspecified: R11.2

## 2018-01-27 HISTORY — DX: Unspecified osteoarthritis, unspecified site: M19.90

## 2018-01-27 HISTORY — DX: Personal history of other medical treatment: Z92.89

## 2018-01-27 HISTORY — DX: Pneumonia, unspecified organism: J18.9

## 2018-01-27 HISTORY — DX: Other specified postprocedural states: Z98.890

## 2018-01-27 HISTORY — DX: Anxiety disorder, unspecified: F41.9

## 2018-01-27 HISTORY — DX: Pure hypercholesterolemia, unspecified: E78.00

## 2018-01-27 HISTORY — DX: Cardiac murmur, unspecified: R01.1

## 2018-01-27 HISTORY — DX: Personal history of other diseases of the digestive system: Z87.19

## 2018-01-27 HISTORY — DX: Gastro-esophageal reflux disease without esophagitis: K21.9

## 2018-01-27 LAB — URINALYSIS, ROUTINE W REFLEX MICROSCOPIC
BILIRUBIN URINE: NEGATIVE
Glucose, UA: NEGATIVE mg/dL
Ketones, ur: 20 mg/dL — AB
LEUKOCYTES UA: NEGATIVE
Nitrite: NEGATIVE
Protein, ur: 30 mg/dL — AB
SPECIFIC GRAVITY, URINE: 1.013 (ref 1.005–1.030)
pH: 6 (ref 5.0–8.0)

## 2018-01-27 LAB — COMPREHENSIVE METABOLIC PANEL
AST: 16 U/L (ref 15–41)
Albumin: 3.7 g/dL (ref 3.5–5.0)
Alkaline Phosphatase: 106 U/L (ref 38–126)
Anion gap: 20 — ABNORMAL HIGH (ref 5–15)
BUN: 10 mg/dL (ref 8–23)
CALCIUM: 9 mg/dL (ref 8.9–10.3)
CO2: 15 mmol/L — AB (ref 22–32)
CREATININE: 1.18 mg/dL — AB (ref 0.44–1.00)
Chloride: 106 mmol/L (ref 98–111)
GFR, EST AFRICAN AMERICAN: 49 mL/min — AB (ref >60)
GFR, EST NON AFRICAN AMERICAN: 42 mL/min — AB (ref >60)
Glucose, Bld: 108 mg/dL — ABNORMAL HIGH (ref 70–99)
Potassium: 2.8 mmol/L — ABNORMAL LOW (ref 3.5–5.1)
SODIUM: 141 mmol/L (ref 135–145)
Total Bilirubin: 0.9 mg/dL (ref 0.3–1.2)
Total Protein: 6.9 g/dL (ref 6.5–8.1)

## 2018-01-27 LAB — CBC WITH DIFFERENTIAL/PLATELET
Abs Immature Granulocytes: 0.03 10*3/uL (ref 0.00–0.07)
BASOS ABS: 0.1 10*3/uL (ref 0.0–0.1)
BASOS PCT: 1 %
EOS ABS: 0.3 10*3/uL (ref 0.0–0.5)
Eosinophils Relative: 5 %
HEMATOCRIT: 37 % (ref 36.0–46.0)
Hemoglobin: 11.1 g/dL — ABNORMAL LOW (ref 12.0–15.0)
Immature Granulocytes: 0 %
LYMPHS ABS: 2.5 10*3/uL (ref 0.7–4.0)
Lymphocytes Relative: 35 %
MCH: 26.8 pg (ref 26.0–34.0)
MCHC: 30 g/dL (ref 30.0–36.0)
MCV: 89.4 fL (ref 80.0–100.0)
Monocytes Absolute: 1.1 10*3/uL — ABNORMAL HIGH (ref 0.1–1.0)
Monocytes Relative: 16 %
NRBC: 0 % (ref 0.0–0.2)
Neutro Abs: 3 10*3/uL (ref 1.7–7.7)
Neutrophils Relative %: 43 %
Platelets: 324 10*3/uL (ref 150–400)
RBC: 4.14 MIL/uL (ref 3.87–5.11)
RDW: 18.5 % — AB (ref 11.5–15.5)
WBC: 7 10*3/uL (ref 4.0–10.5)

## 2018-01-27 LAB — PROTIME-INR
INR: 2.09
PROTHROMBIN TIME: 23.2 s — AB (ref 11.4–15.2)

## 2018-01-27 LAB — TROPONIN I: Troponin I: <0.03 ng/mL (ref <0.03)

## 2018-01-27 LAB — TSH: TSH: 1.957 u[IU]/mL (ref 0.350–4.500)

## 2018-01-27 LAB — BPAM FFP
BLOOD PRODUCT EXPIRATION DATE: 201911162359
Blood Product Expiration Date: 201911162359
ISSUE DATE / TIME: 201911121218
ISSUE DATE / TIME: 201911121218
UNIT TYPE AND RH: 6200
Unit Type and Rh: 6200

## 2018-01-27 LAB — PREPARE FRESH FROZEN PLASMA

## 2018-01-27 LAB — I-STAT TROPONIN, ED: Troponin i, poc: 0 ng/mL (ref 0.00–0.08)

## 2018-01-27 LAB — I-STAT CG4 LACTIC ACID, ED
Lactic Acid, Venous: 13.28 mmol/L (ref 0.5–1.9)
Lactic Acid, Venous: 3.03 mmol/L (ref 0.5–1.9)

## 2018-01-27 LAB — BPAM RBC
Blood Product Expiration Date: 201912092359
Blood Product Expiration Date: 201912112359
ISSUE DATE / TIME: 201911121218
ISSUE DATE / TIME: 201911121218
UNIT TYPE AND RH: 5100
Unit Type and Rh: 5100

## 2018-01-27 LAB — MRSA PCR SCREENING: MRSA by PCR: NEGATIVE

## 2018-01-27 LAB — CK: Total CK: 303 U/L — ABNORMAL HIGH (ref 38–234)

## 2018-01-27 LAB — BRAIN NATRIURETIC PEPTIDE: B Natriuretic Peptide: 28 pg/mL (ref 0.0–100.0)

## 2018-01-27 MED ORDER — AMLODIPINE BESYLATE 10 MG PO TABS
10.0000 mg | ORAL_TABLET | Freq: Every day | ORAL | Status: DC
  Administered 2018-01-27 – 2018-01-30 (×4): 10 mg via ORAL
  Filled 2018-01-27 (×4): qty 1

## 2018-01-27 MED ORDER — PANTOPRAZOLE SODIUM 40 MG PO TBEC
40.0000 mg | DELAYED_RELEASE_TABLET | Freq: Every day | ORAL | Status: DC
  Administered 2018-01-27 – 2018-01-30 (×4): 40 mg via ORAL
  Filled 2018-01-27 (×4): qty 1

## 2018-01-27 MED ORDER — SODIUM CHLORIDE 0.9 % IV BOLUS
500.0000 mL | Freq: Once | INTRAVENOUS | Status: AC
  Administered 2018-01-27: 500 mL via INTRAVENOUS

## 2018-01-27 MED ORDER — AMLODIPINE BESYLATE-VALSARTAN 10-320 MG PO TABS: 1.0000 | ORAL_TABLET | Freq: Every day | ORAL | Status: DC

## 2018-01-27 MED ORDER — PRAVASTATIN SODIUM 20 MG PO TABS
20.0000 mg | ORAL_TABLET | Freq: Every day | ORAL | Status: DC
  Administered 2018-01-27 – 2018-01-30 (×4): 20 mg via ORAL
  Filled 2018-01-27 (×4): qty 1

## 2018-01-27 MED ORDER — SODIUM CHLORIDE 0.9 % IV SOLN
INTRAVENOUS | Status: DC
  Administered 2018-01-28 – 2018-01-29 (×4): via INTRAVENOUS

## 2018-01-27 MED ORDER — TRAZODONE HCL 50 MG PO TABS: 50.0000 mg | ORAL_TABLET | Freq: Every evening | ORAL | Status: DC | PRN

## 2018-01-27 MED ORDER — CARBIDOPA-LEVODOPA 25-100 MG PO TABS
4.0000 | ORAL_TABLET | Freq: Four times a day (QID) | ORAL | Status: DC
  Administered 2018-01-27 – 2018-01-30 (×10): 4 via ORAL
  Filled 2018-01-27 (×10): qty 4

## 2018-01-27 MED ORDER — BOOST / RESOURCE BREEZE PO LIQD CUSTOM
1.0000 | Freq: Three times a day (TID) | ORAL | Status: DC
  Administered 2018-01-28: 1 via ORAL

## 2018-01-27 MED ORDER — ONDANSETRON HCL 4 MG/2ML IJ SOLN
4.0000 mg | Freq: Four times a day (QID) | INTRAMUSCULAR | Status: DC | PRN
  Administered 2018-01-27: 4 mg via INTRAVENOUS
  Filled 2018-01-27: qty 2

## 2018-01-27 MED ORDER — ORAL CARE MOUTH RINSE
15.0000 mL | Freq: Two times a day (BID) | OROMUCOSAL | Status: DC
  Administered 2018-01-28 – 2018-01-30 (×5): 15 mL via OROMUCOSAL

## 2018-01-27 MED ORDER — ONDANSETRON HCL 4 MG PO TABS: 4.0000 mg | ORAL_TABLET | Freq: Four times a day (QID) | ORAL | Status: DC | PRN

## 2018-01-27 MED ORDER — FAMOTIDINE 20 MG PO TABS
20.0000 mg | ORAL_TABLET | Freq: Two times a day (BID) | ORAL | Status: DC
  Administered 2018-01-27 – 2018-01-30 (×6): 20 mg via ORAL
  Filled 2018-01-27 (×6): qty 1

## 2018-01-27 MED ORDER — IRBESARTAN 300 MG PO TABS
300.0000 mg | ORAL_TABLET | Freq: Every day | ORAL | Status: DC
  Administered 2018-01-27 – 2018-01-30 (×4): 300 mg via ORAL
  Filled 2018-01-27 (×4): qty 1

## 2018-01-27 MED ORDER — FLUOXETINE HCL 20 MG PO CAPS
40.0000 mg | ORAL_CAPSULE | Freq: Every day | ORAL | Status: DC
  Administered 2018-01-27 – 2018-01-30 (×4): 40 mg via ORAL
  Filled 2018-01-27 (×5): qty 2

## 2018-01-27 MED ORDER — VITAMIN D (ERGOCALCIFEROL) 1.25 MG (50000 UNIT) PO CAPS: 50000.0000 [IU] | ORAL_CAPSULE | ORAL | Status: DC

## 2018-01-27 MED ORDER — APIXABAN 5 MG PO TABS
10.0000 mg | ORAL_TABLET | Freq: Two times a day (BID) | ORAL | Status: AC
  Administered 2018-01-27 – 2018-01-29 (×5): 10 mg via ORAL
  Filled 2018-01-27 (×5): qty 2

## 2018-01-27 MED ORDER — FOLIC ACID 1 MG PO TABS
1.0000 mg | ORAL_TABLET | Freq: Every day | ORAL | Status: DC
  Administered 2018-01-27 – 2018-01-30 (×4): 1 mg via ORAL
  Filled 2018-01-27 (×4): qty 1

## 2018-01-27 NOTE — ED Provider Notes (Addendum)
[facility]   CSN: [id number] Arrival date & time: 01/27/18  1222     History   Chief Complaint No chief complaint on file.   HPI [name] is a 84 y.o. female.  Patient is a an 84 year old female brought by EMS after a fall and unresponsive episode at home.  This patient has had recent medical issues including admission for a urinary tract infection and pulmonary emboli.  Today she told family member she felt weak, then had an episode where she went unresponsive and fell on the floor.  She was initially unresponsive with family members who called EMS.  When EMS arrived, they found the patient to have labored respirations and altered mental status.  She was then transported here to to be evaluated.  The patient adds little additional history secondary to acuity of condition which can tell me that she has pain in both knees.  The history is provided by the patient.    No past medical history on file.  There are no active problems to display for this patient.      OB History   None      Home Medications    Prior to Admission medications   Not on File    Family History No family history on file.  Social History Social History   Tobacco Use  . Smoking status: Not on file  Substance Use Topics  . Alcohol use: Not on file  . Drug use: Not on file     Allergies   Patient has no allergy information on record.   Review of Systems Review of Systems   Physical Exam Updated Vital Signs BP 135/77   Pulse 99   Resp (!) 36   SpO2 100%   Physical Exam  Constitutional: She appears well-developed and well-nourished. No distress.  HENT:  Head: Normocephalic and atraumatic.  Neck: Normal range of motion. Neck supple.  Cardiovascular: Normal rate and regular rhythm. Exam reveals no gallop and no friction rub.  No murmur heard. Pulmonary/Chest: Effort normal and breath sounds normal. No respiratory  distress. She has no wheezes.  Abdominal: Soft. Bowel sounds are normal. She exhibits no distension. There is no tenderness.  Musculoskeletal: Normal range of motion.  Neurological:  Patient is somnolent, but arousable.  She has been witnessed moving all extremities.  Neurologic exam is difficult otherwise secondary to acuity of condition.  Skin: Skin is warm and dry. She is not diaphoretic.  Nursing note and vitals reviewed.    ED Treatments / Results  Labs (all labs ordered are listed, but only abnormal results are displayed) Labs Reviewed  COMPREHENSIVE METABOLIC PANEL  CBC WITH DIFFERENTIAL/PLATELET  BRAIN NATRIURETIC PEPTIDE  PROTIME-INR  I-STAT CG4 LACTIC ACID, ED  I-STAT TROPONIN, ED  TYPE AND SCREEN  PREPARE FRESH FROZEN PLASMA    EKG None  Radiology No results found.  Procedures Procedures (including critical care time)  Medications Ordered in ED Medications  sodium chloride 0.9 % bolus 500 mL (has no administration in time range)     Initial Impression / Assessment and Plan / ED Course  I have reviewed the triage vital signs and the nursing notes.  Pertinent labs & imaging results that were available during my care of the patient were reviewed by me and considered in my medical decision making (see chart for details).  Patient brought by EMS after some sort of syncopal or seizure-like episode that occurred at home.  Family  reports weakness for the past several days and has had recent hospitalizations for pneumonia, PE, and UTI.  Her work-up today reveals no abnormality in the head and cervical spine CT.  Laboratory studies are unremarkable with the exception of a lactic acidosis.  She has a lactate of 13.  She was given IV fluids and this was rechecked and is normalizing.  Care has been discussed with Dr. Sharyon Medicus who agrees to admit.  I am uncertain as to what caused her lactic acidosis, however seizure is a possibility.  She is on no medications that I am aware  of that would cause this.  Final Clinical Impressions(s) / ED Diagnoses   Final diagnoses:  None    ED Discharge Orders    None       Geoffery Lyons, MD 01/27/18 1527    Geoffery Lyons, MD 02/13/18 2247

## 2018-01-27 NOTE — ED Notes (Signed)
Patient transported to CT 

## 2018-01-27 NOTE — Progress Notes (Signed)
[name] is a 84 y.o. female patient admitted from ED awake, alert - oriented  X 4 - no acute distress noted.  VSS - Blood pressure (!) 144/74, pulse 76, temperature 98.7 F (37.1 C), temperature source Oral, resp. rate 14, height 5\' 6"  (1.676 m), SpO2 99 %.    IV in place, occlusive dsg intact without redness.  Orientation to room, and floor completed with information packet given to patient/family.  Patient declined safety video at this time.  Admission INP armband ID verified with patient/family, and in place.   SR up x 2, fall assessment complete, with patient and family able to verbalize understanding of risk associated with falls, and verbalized understanding to call nsg before up out of bed.  Call light within reach, patient able to voice, and demonstrate understanding.  Skin, clean-dry- intact without evidence of bruising, or skin tears.   No evidence of skin break down noted on exam.     Will cont to eval and treat per MD orders.  Eligah East, RN 01/27/2018 6:57 PM

## 2018-01-27 NOTE — H&P (Signed)
[facility]                                                                                    Patient Demographics  [name], is a 84 y.o. female  CSN: [id number]  MRN: [id number]  DOB - November 25, 1933  Admit Date - 01/27/2018  Outpatient Primary MD for the patient is Zola Button, Grayling Congress, DO   With History of -  Parkinson's disease, pneumonia and pulmonary embolism  in for   Altered mental status  HPI  [name]  is a 84 y.o. female, with past medical history significant for Parkinson's disease, recent admission for pneumonia and pulmonary embolism, frequent UTIs presenting for increased weakness.  The patient was admitted to the hospital for pneumonia and PE and was discharged last Thursday.  I could not review the old chart since it is scheduled for merging.  The patient has been getting weaker since her discharge last week on Thursday and she had 2 falls in the last few days, 1 was today when the family noticed blood coming from her mouth.  EMS arrived to the house center GSS was 5 and her mental status started improving slowly on the way.  In the emergency room her CT of the head and neck were negative her lactate was 13.3 .  Her mental status started to improve slowly on IV fluids.  She denied chest pain shortness of breath or abdominal pain.  She had a tongue bite on the right.  To note is that the patient had an EEG few months ago for possible seizures but it was negative.  Discussed with neurology on the phone, Dr. Jerrell Belfast , who advised MRI of the brain .  Consult if needed.  Patient had another lactic acid in the emergency room which was 3 just before she left to stepdown.   Review of Systems    In addition to the HPI above,  No Fever-chills, No Headache, No changes with Vision or hearing, No problems swallowing food or Liquids, No Chest pain, Cough or Shortness of Breath, No Abdominal pain, No Nausea or Vommitting, Bowel movements are regular, No  Blood in stool or Urine, No dysuria, No new skin rashes or bruises, No new joints pains-aches,  No new weakness, tingling, numbness in any extremity, No recent weight gain or loss, No polyuria, polydypsia or polyphagia, No significant Mental Stressors.  A full 10 point Review of Systems was done, except as stated above, all other Review of Systems were negative.   Social History Social History   Tobacco Use  . Smoking status: Not on file  Substance Use Topics  . Alcohol use: Not on file     Family History Significant only for Parkinson's disease  Prior to Admission medications   Medication Sig Start Date End Date Taking? Authorizing Provider  amantadine (SYMMETREL) 100 MG capsule Take 100 mg by mouth 2 (two) times daily. 01/14/18  Yes [provider]  amLODipine-valsartan (EXFORGE) 10-320 MG tablet Take 1 tablet by mouth daily. 01/14/18  Yes [provider]  carbidopa-levodopa (SINEMET IR) 25-100 MG tablet Take 4 tablets by mouth 4 (four) times daily. 01/14/18  Yes [provider]  ELIQUIS 5 MG TABS tablet Take 10 mg by mouth 2 (two) times daily.  01/22/18  Yes [provider]  famotidine (PEPCID) 20 MG tablet Take 20 mg by mouth 2 (two) times daily.   Yes [provider]  FLUoxetine (PROZAC) 40 MG capsule Take 40 mg by mouth daily. 01/14/18  Yes [provider]  folic acid (FOLVITE) 1 MG tablet Take 1 mg by mouth daily. 01/22/18  Yes [provider]  nitrofurantoin, macrocrystal-monohydrate, (MACROBID) 100 MG capsule Take 100 mg by mouth 2 (two) times daily. 01/23/18  Yes [provider]  OLANZapine (ZYPREXA) 10 MG tablet Take 10 mg by mouth daily. 01/14/18  Yes [provider]  pantoprazole (PROTONIX) 40 MG tablet Take 40 mg by mouth daily. 01/14/18  Yes [provider]  pravastatin (PRAVACHOL) 20 MG tablet Take 20 mg by mouth daily. 01/14/18  Yes [provider]  traZODone  (DESYREL) 50 MG tablet Take 50 mg by mouth at bedtime as needed. 01/24/18  Yes [provider]  Vitamin D, Ergocalciferol, (DRISDOL) 1.25 MG (50000 UT) CAPS capsule Take 50,000 Units by mouth once a week. 12/31/17  Yes [provider]    Allergies  Allergen Reactions  . Codeine Nausea And Vomiting  . Gabapentin Other (See Comments)    Caused bilateral feet swelling  . Latex Other (See Comments)    Burns the skin  . Morphine Sulfate Nausea And Vomiting  . Penicillins     Has patient had a PCN reaction causing immediate rash, facial/tongue/throat swelling, SOB or lightheadedness with hypotension: No Has patient had a PCN reaction causing severe rash involving mucus membranes or skin necrosis: No Has patient had a PCN reaction that required hospitalization: No Has patient had a PCN reaction occurring within the last 10 years: No If all of the above answers are "NO", then may proceed with Cephalosporin use.  . Sertraline Other (See Comments)    unknown    Physical Exam  Vitals  Blood pressure 138/69, pulse 83, resp. rate 18, SpO2 98 %.   1. General well-developed, obese female, chronically ill  2.  Flat affect and insight, Not Suicidal or Homicidal, pleasantly confused  3. No F.N deficits, grossly, patient was moving all extremities.  4. Ears and Eyes appear Normal, Conjunctivae clear, PERRLA. Moist Oral Mucosa.  5. Supple Neck, No JVD, No cervical lymphadenopathy appriciated, No Carotid Bruits.  6. Symmetrical Chest wall movement, Good air movement bilaterally, CTAB.  7. RRR, No Gallops, Rubs or Murmurs, No Parasternal Heave.  8. Positive Bowel Sounds, Abdomen Soft, Non tender, No organomegaly appriciated,No rebound -guarding or rigidity.  9.  No Cyanosis, Normal Skin Turgor, No Skin Rash or Bruise.  10. Good muscle tone,  joints appear normal , mild trace edema in the lower extremities.    Data Review  CBC Recent Labs  Lab 01/27/18 1242  WBC 7.0   HGB 11.1*  HCT 37.0  PLT 324  MCV 89.4  MCH 26.8  MCHC 30.0  RDW 18.5*  LYMPHSABS 2.5  MONOABS 1.1*  EOSABS 0.3  BASOSABS 0.1   ------------------------------------------------------------------------------------------------------------------  Chemistries  Recent Labs  Lab 01/27/18 1242  NA 141  K 2.8*  CL 106  CO2 15*  GLUCOSE 108*  BUN 10  CREATININE 1.18*  CALCIUM 9.0  AST 16  ALT <5  ALKPHOS 106  BILITOT 0.9   ------------------------------------------------------------------------------------------------------------------ CrCl cannot be calculated (Unknown ideal weight.). ------------------------------------------------------------------------------------------------------------------ No results for input(s): TSH, T4TOTAL,  T3FREE, THYROIDAB in the last 72 hours.  Invalid input(s): FREET3   Coagulation profile Recent Labs  Lab 01/27/18 1242  INR 2.09   ------------------------------------------------------------------------------------------------------------------- No results for input(s): DDIMER in the last 72 hours. -------------------------------------------------------------------------------------------------------------------  Cardiac Enzymes No results for input(s): CKMB, TROPONINI, MYOGLOBIN in the last 168 hours.  Invalid input(s): CK ------------------------------------------------------------------------------------------------------------------ Invalid input(s): POCBNP   ---------------------------------------------------------------------------------------------------------------  Urinalysis    Component Value Date/Time   COLORURINE YELLOW 01/27/2018 1344   APPEARANCEUR HAZY (A) 01/27/2018 1344   LABSPEC 1.013 01/27/2018 1344   PHURINE 6.0 01/27/2018 1344   GLUCOSEU NEGATIVE 01/27/2018 1344   HGBUR MODERATE (A) 01/27/2018 1344   BILIRUBINUR NEGATIVE 01/27/2018 1344   KETONESUR 20 (A) 01/27/2018 1344   PROTEINUR 30 (A)  01/27/2018 1344   NITRITE NEGATIVE 01/27/2018 1344   LEUKOCYTESUR NEGATIVE 01/27/2018 1344    ----------------------------------------------------------------------------------------------------------------   Imaging results:   Ct Head Wo Contrast  Result Date: 01/27/2018 CLINICAL DATA:  Witnessed fall while walking. EXAM: CT HEAD WITHOUT CONTRAST CT CERVICAL SPINE WITHOUT CONTRAST TECHNIQUE: Multidetector CT imaging of the head and cervical spine was performed following the standard protocol without intravenous contrast. Multiplanar CT image reconstructions of the cervical spine were also generated. COMPARISON:  Head CT - 01/19/2018; 12/30/2017 FINDINGS: CT HEAD FINDINGS Brain: Scattered minimal periventricular hypodensities, left greater than right. No CT evidence of acute large territory infarct. No intraparenchymal or extra-axial mass or hemorrhage. Normal size and configuration of the ventricles and the basilar cisterns. No midline shift. Vascular: Minimal amount of intracranial atherosclerosis. Skull: No displaced calvarial fracture. Sinuses/Orbits: Limited visualization of the paranasal sinuses and mastoid air cells is normal. No air-fluid levels. Other: Regional soft tissues appear normal. _________________________________________________________ CT CERVICAL SPINE FINDINGS Alignment: C1 to the superior endplate of T2 is imaged. There is straightening expected cervical lordosis. No anterolisthesis or retrolisthesis. The bilateral facets appear normally aligned. There is partial ankylosis of the bilateral C2-C3 transverse facets. Skull base and vertebrae: The dens is normally positioned between the lateral masses of C1. Normal atlantodental and atlantoaxial articulations. Soft tissues and spinal canal: Prevertebral soft tissues appear normal. Disc levels: There is partial ankylosis of anterior aspects of the C4-C5 and C5-C6 intervertebral disc spaces. Moderate multilevel cervical spine DDD, worse  at C3-C4 with disc space height loss, endplate irregularity and small posteriorly directed disc osteophyte complex at this location. Upper chest: Limited visualization of the lung apices demonstrates grossly symmetric biapical pleuroparenchymal thickening. Other: The thyroid appears mildly enlarged and heterogeneous without discrete nodule on this noncontrast examination. No bulky cervical lymphadenopathy on this noncontrast examination. IMPRESSION: Head CT Impression: 1. Minimal microvascular ischemic disease without superimposed acute intracranial process. Cervical Spine CT Impression: 1. No fracture or static subluxation of the cervical spine. 2. Moderate multilevel cervical spine DDD, worse at C3-C4. 3. Partial ankylosis involving the anterior aspects of the C4-C5 and C5-C6 intervertebral disc spaces. Electronically Signed   By: Simonne Come M.D.   On: 01/27/2018 14:20   Ct Cervical Spine Wo Contrast  Result Date: 01/27/2018 CLINICAL DATA:  Witnessed fall while walking. EXAM: CT HEAD WITHOUT CONTRAST CT CERVICAL SPINE WITHOUT CONTRAST TECHNIQUE: Multidetector CT imaging of the head and cervical spine was performed following the standard protocol without intravenous contrast. Multiplanar CT image reconstructions of the cervical spine were also generated. COMPARISON:  Head CT - 01/19/2018; 12/30/2017 FINDINGS: CT HEAD FINDINGS Brain: Scattered minimal periventricular hypodensities, left greater than right. No CT evidence of acute large territory infarct. No intraparenchymal or extra-axial mass or hemorrhage.  Normal size and configuration of the ventricles and the basilar cisterns. No midline shift. Vascular: Minimal amount of intracranial atherosclerosis. Skull: No displaced calvarial fracture. Sinuses/Orbits: Limited visualization of the paranasal sinuses and mastoid air cells is normal. No air-fluid levels. Other: Regional soft tissues appear normal. _________________________________________________________  CT CERVICAL SPINE FINDINGS Alignment: C1 to the superior endplate of T2 is imaged. There is straightening expected cervical lordosis. No anterolisthesis or retrolisthesis. The bilateral facets appear normally aligned. There is partial ankylosis of the bilateral C2-C3 transverse facets. Skull base and vertebrae: The dens is normally positioned between the lateral masses of C1. Normal atlantodental and atlantoaxial articulations. Soft tissues and spinal canal: Prevertebral soft tissues appear normal. Disc levels: There is partial ankylosis of anterior aspects of the C4-C5 and C5-C6 intervertebral disc spaces. Moderate multilevel cervical spine DDD, worse at C3-C4 with disc space height loss, endplate irregularity and small posteriorly directed disc osteophyte complex at this location. Upper chest: Limited visualization of the lung apices demonstrates grossly symmetric biapical pleuroparenchymal thickening. Other: The thyroid appears mildly enlarged and heterogeneous without discrete nodule on this noncontrast examination. No bulky cervical lymphadenopathy on this noncontrast examination. IMPRESSION: Head CT Impression: 1. Minimal microvascular ischemic disease without superimposed acute intracranial process. Cervical Spine CT Impression: 1. No fracture or static subluxation of the cervical spine. 2. Moderate multilevel cervical spine DDD, worse at C3-C4. 3. Partial ankylosis involving the anterior aspects of the C4-C5 and C5-C6 intervertebral disc spaces. Electronically Signed   By: Simonne ComeJohn  Watts M.D.   On: 01/27/2018 14:20   Dg Pelvis Portable  Result Date: 01/27/2018 CLINICAL DATA:  84 year old female status post syncope, fall at home. EXAM: PORTABLE PELVIS 1-2 VIEWS COMPARISON:  Lumbar radiographs 08/31/2013. CT Abdomen and Pelvis 08/05/2011. FINDINGS: Portable AP supine view at 1215 hours. Femoral heads are normally located. Proximal femurs appear stable and intact. Pelvis appears stable and intact. Partially  visible chronic lower lumbar fusion hardware. Negative visible lower abdominal and pelvic visceral contours, bowel gas. IMPRESSION: No acute fracture or dislocation identified about the pelvis. Electronically Signed   By: Odessa FlemingH  Hall M.D.   On: 01/27/2018 13:16   Dg Chest Portable 1 View  Result Date: 01/27/2018 CLINICAL DATA:  84 year old female status post syncope, fall at home. Recent bilateral pulmonary emboli. EXAM: PORTABLE CHEST 1 VIEW COMPARISON:  Chest CTA 01/20/2018 and earlier. FINDINGS: Portable AP supine view at 1215 hours. Stable low lung volumes. Stable cardiac size and mediastinal contours. Streaky opacity at the right lung base has increased. No pneumothorax, pulmonary edema, pleural effusion or other confluent opacity. Visualized tracheal air column is within normal limits. Paucity of bowel gas in the upper abdomen. No acute osseous abnormality identified. IMPRESSION: 1. Continued low lung volumes. Increased right lung base opacity could reflect atelectasis or pulmonary infarct in this clinical setting. 2.  No acute traumatic injury identified. Electronically Signed   By: Odessa FlemingH  Hall M.D.   On: 01/27/2018 13:16   Dg Knee Complete 4 Views Left  Result Date: 01/27/2018 CLINICAL DATA:  Syncopal episode and fall with bilateral knee pain, initial encounter EXAM: LEFT KNEE - COMPLETE 4+ VIEW COMPARISON:  01/25/2018 FINDINGS: Left knee prosthesis is again identified and stable in appearance. No acute fracture or dislocation is noted. No soft tissue changes are seen. IMPRESSION: Stable left knee prosthesis.  No acute abnormality noted. Electronically Signed   By: Alcide CleverMark  Lukens M.D.   On: 01/27/2018 13:18   Dg Knee Complete 4 Views Right  Result Date: 01/27/2018 CLINICAL DATA:  Recent syncopal episode with fall and knee pain, initial encounter EXAM: RIGHT KNEE - COMPLETE 4+ VIEW COMPARISON:  None. FINDINGS: Right knee prosthesis is noted. No acute fracture or dislocation is seen. No loosening is  identified. No soft tissue changes are noted. IMPRESSION: No acute abnormality noted. Electronically Signed   By: Alcide Clever M.D.   On: 01/27/2018 13:19    My personal review of EKG: Ectopic atrial rhythm at 95 bpm with paired PVCs and nonspecific intraventricular conduction delay  Assessment & Plan  Lactic acidosis, unknown source Hold Zyprexa and continue with IV fluids Check CK and EEG for possible seizures. Not sure what is causing her lactic acid to be elevated at this time.  Syncopal episode?  Seizures had a negative EEG few months ago, will repeat Check MRI of the brain, discussed with Dr. Jerrell Belfast, no official consult  History of PE will continue with Eliquis  History of Parkinson's on Sinemet  Hypertension we will continue with Exforge her blood pressure is stable.  GERD we will continue with Pepcid  DVT Prophylaxis Eliquis  AM Labs Ordered, also please review Full Orders  Family Communication: Admission, patients condition and plan of care including tests being ordered have been discussed with the patient and daughter who indicate understanding and agree with the plan and Code Status.  Discussed goals of care with them  Code Status full  Disposition Plan: To be determined  Time spent in minutes : 53 minutes  Condition critical   @SIGNATURE @

## 2018-01-27 NOTE — ED Triage Notes (Signed)
[facility], pts family called, pt had witnessed fall while walking and collapsed to floor from standing position, pt unresponsive for family and EMS, EMS reports GCS 5 initially, prior to arrival pt became more responsive to verbal stimuli and GCS upon arrival to ED 15 per EMS, pt HR 108, RR 36, RA O2 sat 98%, BP 181/95, CBG 94, # 20 R hand, ST on monitor

## 2018-01-27 NOTE — ED Notes (Signed)
Pt on Eliquis d/t hx of blood clot, pt c/o generalized abd pain with EMS

## 2018-01-28 ENCOUNTER — Other Ambulatory Visit (HOSPITAL_COMMUNITY)

## 2018-01-28 ENCOUNTER — Other Ambulatory Visit: Payer: Self-pay | Admitting: Cardiology

## 2018-01-28 ENCOUNTER — Inpatient Hospital Stay (HOSPITAL_COMMUNITY): Payer: Medicare Other

## 2018-01-28 DIAGNOSIS — R55 Syncope and collapse: Secondary | ICD-10-CM

## 2018-01-28 DIAGNOSIS — G20 Parkinson's disease: Secondary | ICD-10-CM

## 2018-01-28 DIAGNOSIS — G20A1 Parkinson's disease without dyskinesia, without mention of fluctuations: Secondary | ICD-10-CM

## 2018-01-28 DIAGNOSIS — I2699 Other pulmonary embolism without acute cor pulmonale: Secondary | ICD-10-CM | POA: Diagnosis present

## 2018-01-28 DIAGNOSIS — E872 Acidosis: Secondary | ICD-10-CM | POA: Diagnosis not present

## 2018-01-28 DIAGNOSIS — K219 Gastro-esophageal reflux disease without esophagitis: Secondary | ICD-10-CM | POA: Diagnosis present

## 2018-01-28 DIAGNOSIS — E785 Hyperlipidemia, unspecified: Secondary | ICD-10-CM | POA: Diagnosis present

## 2018-01-28 DIAGNOSIS — I10 Essential (primary) hypertension: Secondary | ICD-10-CM | POA: Diagnosis present

## 2018-01-28 LAB — TROPONIN I

## 2018-01-28 LAB — BASIC METABOLIC PANEL
Anion gap: 8 (ref 5–15)
BUN: 8 mg/dL (ref 8–23)
CO2: 24 mmol/L (ref 22–32)
CREATININE: 1.03 mg/dL — AB (ref 0.44–1.00)
Calcium: 8.7 mg/dL — ABNORMAL LOW (ref 8.9–10.3)
Chloride: 109 mmol/L (ref 98–111)
GFR calc non Af Amer: 50 mL/min — ABNORMAL LOW (ref >60)
GFR, EST AFRICAN AMERICAN: 57 mL/min — AB (ref >60)
GLUCOSE: 86 mg/dL (ref 70–99)
Potassium: 3.2 mmol/L — ABNORMAL LOW (ref 3.5–5.1)
Sodium: 141 mmol/L (ref 135–145)

## 2018-01-28 LAB — LACTIC ACID, PLASMA: Lactic Acid, Venous: 1 mmol/L (ref 0.5–1.9)

## 2018-01-28 LAB — MAGNESIUM: MAGNESIUM: 2 mg/dL (ref 1.7–2.4)

## 2018-01-28 LAB — BLOOD PRODUCT ORDER (VERBAL) VERIFICATION

## 2018-01-28 MED ORDER — BOOST / RESOURCE BREEZE PO LIQD CUSTOM
1.0000 | Freq: Two times a day (BID) | ORAL | Status: DC
  Administered 2018-01-28 – 2018-01-30 (×3): 1 via ORAL

## 2018-01-28 MED ORDER — POTASSIUM CHLORIDE 10 MEQ/100ML IV SOLN
10.0000 meq | INTRAVENOUS | Status: AC
  Administered 2018-01-28 (×3): 10 meq via INTRAVENOUS
  Filled 2018-01-28 (×2): qty 100

## 2018-01-28 MED ORDER — POTASSIUM CHLORIDE 10 MEQ/100ML IV SOLN
10.0000 meq | INTRAVENOUS | Status: AC
  Administered 2018-01-28: 10 meq via INTRAVENOUS
  Filled 2018-01-28 (×2): qty 100

## 2018-01-28 MED ORDER — ENSURE ENLIVE PO LIQD
237.0000 mL | ORAL | Status: DC
  Administered 2018-01-28 – 2018-01-29 (×2): 237 mL via ORAL

## 2018-01-28 MED ORDER — SODIUM CHLORIDE 0.9 % IV SOLN
INTRAVENOUS | Status: DC | PRN
  Administered 2018-01-28: 100 mL via INTRAVENOUS

## 2018-01-28 MED ORDER — ADULT MULTIVITAMIN W/MINERALS CH
1.0000 | ORAL_TABLET | Freq: Every day | ORAL | Status: DC
  Administered 2018-01-28 – 2018-01-30 (×3): 1 via ORAL
  Filled 2018-01-28 (×3): qty 1

## 2018-01-28 MED ORDER — APIXABAN 5 MG PO TABS
5.0000 mg | ORAL_TABLET | Freq: Two times a day (BID) | ORAL | Status: DC
  Administered 2018-01-30: 5 mg via ORAL
  Filled 2018-01-28: qty 1

## 2018-01-28 NOTE — Progress Notes (Signed)
SLP Cancellation Note  Patient Details Name: [name] GivenJoan M Dobies MRN: [id number] DOB: 09/17/1933   Cancelled treatment:       Reason Eval/Treat Not Completed: Patient at procedure or test/unavailable. Will f/u as able.   Maxcine Hamaiewonsky, Kaci Dillie 01/28/2018, 10:33 AM  Maxcine HamLaura Paiewonsky, M.A. CCC-SLP [facility] Pager [phone] Office [phone]

## 2018-01-28 NOTE — Progress Notes (Addendum)
CSW received consult regarding PT recommendation of SNF at discharge.  Patient and her daughter are refusing SNF. Daughter will take her back home with her at discharge. They have been using home health services [facility] [place]. Daughter also inquired about any extra home support through patient's [place] spousal benefits. Patient will also likely need PTAR for transport at discharge. CSW will alert RNCM.  CSW signing off.   Osborne Cascoadia Markelle Najarian LCSW [phone]

## 2018-01-28 NOTE — Progress Notes (Signed)
PROGRESS NOTE                                                                                                                                                                                                             Patient Demographics:    [name], is a 84 y.o. female, DOB - Apr 01, 1933, ZOX:[id number]  Admit date - 01/27/2018   Admitting Physician Carron Curie, MD  Outpatient Primary MD for the patient is Zola Button, Grayling Congress, DO  LOS - 1  No chief complaint on file.      Brief Narrative  [name]  is a 84 y.o. female, with past medical history significant for Parkinson's disease, recent admission for pneumonia and pulmonary embolism, frequent UTIs presenting after a witnessed fall at home when she was ambulating with her daughter, she apparently fell lost consciousness for 45 minutes and had a small tongue bite.  She came to the ER where her head CT was unremarkable.  She had a similar episode during her recent hospitalization which was blamed on vagal nerve stimulation, she had a negative EEG that admission.  She was admitted for further syncope work-up.   Subjective:    [name] today has, No headache, No chest pain, No abdominal pain - No Nausea, No new weakness tingling or numbness, No Cough - SOB.     Assessment  & Plan :      1.  Syncope.  Appears to be recurrent.  Could be vasovagal due to autonomic instability from underlying Parkinson's however this time she had slightly elevated lactate and tongue bite, will be prudent to make sure she did not have a seizure, CT head and MRI brain are nonacute.  Have requested neuro to evaluate.  Will check EEG, orthostatics, echocardiogram, monitor on telemetry and have PT to evaluate her.   2.  Parkinson's.  Home medications continued.  3.  HX of PE.  On Eliquis.  4.  Essential hypertension.  Continue combination of Norvasc and ACE inhibitor.  5.  GERD.  On PPI continue.  6.  Dyslipidemia.  On statin.  7.   Mildly elevated lactate due to dehydration.  Resolved after IV fluids.    Family Communication  :  Daughter  Code Status :  Full  Disposition Plan  :  Tele  Consults  :  Neuro  Procedures  :    CT head and C-spine.  Nonacute.  MRI brain.  Nonacute  EEG -   Echocardiogram -  DVT Prophylaxis  :  Eliquis  Lab Results  Component Value Date   PLT 324 01/27/2018    Diet :  Diet Order            Diet clear liquid Room service appropriate? Yes; Fluid consistency: Thin  Diet effective now               Inpatient Medications Scheduled Meds: . amLODipine  10 mg Oral Daily   And  . irbesartan  300 mg Oral Daily  . apixaban  10 mg Oral BID  . carbidopa-levodopa  4 tablet Oral QID  . famotidine  20 mg Oral BID  . feeding supplement  1 Container Oral TID BM  . FLUoxetine  40 mg Oral Daily  . folic acid  1 mg Oral Daily  . mouth rinse  15 mL Mouth Rinse BID  . pantoprazole  40 mg Oral Daily  . pravastatin  20 mg Oral Daily  . [START ON 02/02/2018] Vitamin D (Ergocalciferol)  50,000 Units Oral Weekly   Continuous Infusions: . sodium chloride 100 mL/hr at 01/28/18 0115  . sodium chloride 100 mL (01/28/18 0647)  . potassium chloride 10 mEq (01/28/18 0649)   PRN Meds:.sodium chloride, ondansetron **OR** ondansetron (ZOFRAN) IV, traZODone  Antibiotics  :   Anti-infectives (From admission, onward)   None          Objective:   Vitals:   01/28/18 0111 01/28/18 0346 01/28/18 0407 01/28/18 0806  BP: 134/75  139/62   Pulse: 77  75   Resp: 14  15   Temp: 98.9 F (37.2 C) 98.9 F (37.2 C)  98.1 F (36.7 C)  TempSrc: Oral Oral  Oral  SpO2: 98%  100%   Height:        Wt Readings from Last 3 Encounters:  No data found for Wt     Intake/Output Summary (Last 24 hours) at 01/28/2018 1025 Last data filed at 01/28/2018 0344 Gross per 24 hour  Intake 199.31 ml  Output 750 ml  Net -550.69 ml     Physical Exam  Awake Alert, Oriented X 3, No new F.N  deficits, Normal affect [place].AT,PERRAL Supple Neck,No JVD, No cervical lymphadenopathy appriciated.  Symmetrical Chest wall movement, Good air movement bilaterally, CTAB RRR,No Gallops,Rubs or new Murmurs, No Parasternal Heave +ve B.Sounds, Abd Soft, No tenderness, No organomegaly appriciated, No rebound - guarding or rigidity. No Cyanosis, Clubbing or edema, No new Rash or bruise       Data Review:    CBC Recent Labs  Lab 01/27/18 1242  WBC 7.0  HGB 11.1*  HCT 37.0  PLT 324  MCV 89.4  MCH 26.8  MCHC 30.0  RDW 18.5*  LYMPHSABS 2.5  MONOABS 1.1*  EOSABS 0.3  BASOSABS 0.1    Chemistries  Recent Labs  Lab 01/27/18 1242 01/28/18 0503  NA 141 141  K 2.8* 3.2*  CL 106 109  CO2 15* 24  GLUCOSE 108* 86  BUN 10 8  CREATININE 1.18* 1.03*  CALCIUM 9.0 8.7*  MG  --  2.0  AST 16  --   ALT <5  --   ALKPHOS 106  --   BILITOT 0.9  --    ------------------------------------------------------------------------------------------------------------------ No results for input(s): CHOL, HDL, LDLCALC, TRIG, CHOLHDL, LDLDIRECT in the last 72 hours.  No results found for: HGBA1C ------------------------------------------------------------------------------------------------------------------ Recent Labs    01/27/18 1721  TSH 1.957   ------------------------------------------------------------------------------------------------------------------ No results for input(s): VITAMINB12, FOLATE, FERRITIN, TIBC, IRON, RETICCTPCT in  the last 72 hours.  Coagulation profile Recent Labs  Lab 01/27/18 1242  INR 2.09    No results for input(s): DDIMER in the last 72 hours.  Cardiac Enzymes Recent Labs  Lab 01/27/18 1721 01/27/18 2308 01/28/18 0503  TROPONINI <0.03 <0.03 <0.03   ------------------------------------------------------------------------------------------------------------------    Component Value Date/Time   BNP 28.0 01/27/2018 1242    Micro Results Recent  Results (from the past 240 hour(s))  MRSA PCR Screening     Status: None   Collection Time: 01/27/18  6:59 PM  Result Value Ref Range Status   MRSA by PCR NEGATIVE NEGATIVE Final    Comment:        The GeneXpert MRSA Assay (FDA approved for NASAL specimens only), is one component of a comprehensive MRSA colonization surveillance program. It is not intended to diagnose MRSA infection nor to guide or monitor treatment for MRSA infections. Performed at [facility], [address], [place], [place] [address]     Radiology Reports Ct Head Wo Contrast  Result Date: 01/27/2018 CLINICAL DATA:  Witnessed fall while walking. EXAM: CT HEAD WITHOUT CONTRAST CT CERVICAL SPINE WITHOUT CONTRAST TECHNIQUE: Multidetector CT imaging of the head and cervical spine was performed following the standard protocol without intravenous contrast. Multiplanar CT image reconstructions of the cervical spine were also generated. COMPARISON:  Head CT - 01/19/2018; 12/30/2017 FINDINGS: CT HEAD FINDINGS Brain: Scattered minimal periventricular hypodensities, left greater than right. No CT evidence of acute large territory infarct. No intraparenchymal or extra-axial mass or hemorrhage. Normal size and configuration of the ventricles and the basilar cisterns. No midline shift. Vascular: Minimal amount of intracranial atherosclerosis. Skull: No displaced calvarial fracture. Sinuses/Orbits: Limited visualization of the paranasal sinuses and mastoid air cells is normal. No air-fluid levels. Other: Regional soft tissues appear normal. _________________________________________________________ CT CERVICAL SPINE FINDINGS Alignment: C1 to the superior endplate of T2 is imaged. There is straightening expected cervical lordosis. No anterolisthesis or retrolisthesis. The bilateral facets appear normally aligned. There is partial ankylosis of the bilateral C2-C3 transverse facets. Skull base and vertebrae: The dens is normally  positioned between the lateral masses of C1. Normal atlantodental and atlantoaxial articulations. Soft tissues and spinal canal: Prevertebral soft tissues appear normal. Disc levels: There is partial ankylosis of anterior aspects of the C4-C5 and C5-C6 intervertebral disc spaces. Moderate multilevel cervical spine DDD, worse at C3-C4 with disc space height loss, endplate irregularity and small posteriorly directed disc osteophyte complex at this location. Upper chest: Limited visualization of the lung apices demonstrates grossly symmetric biapical pleuroparenchymal thickening. Other: The thyroid appears mildly enlarged and heterogeneous without discrete nodule on this noncontrast examination. No bulky cervical lymphadenopathy on this noncontrast examination. IMPRESSION: Head CT Impression: 1. Minimal microvascular ischemic disease without superimposed acute intracranial process. Cervical Spine CT Impression: 1. No fracture or static subluxation of the cervical spine. 2. Moderate multilevel cervical spine DDD, worse at C3-C4. 3. Partial ankylosis involving the anterior aspects of the C4-C5 and C5-C6 intervertebral disc spaces. Electronically Signed   By: Simonne Come M.D.   On: 01/27/2018 14:20   Ct Cervical Spine Wo Contrast  Result Date: 01/27/2018 CLINICAL DATA:  Witnessed fall while walking. EXAM: CT HEAD WITHOUT CONTRAST CT CERVICAL SPINE WITHOUT CONTRAST TECHNIQUE: Multidetector CT imaging of the head and cervical spine was performed following the standard protocol without intravenous contrast. Multiplanar CT image reconstructions of the cervical spine were also generated. COMPARISON:  Head CT - 01/19/2018; 12/30/2017 FINDINGS: CT HEAD FINDINGS Brain: Scattered minimal  periventricular hypodensities, left greater than right. No CT evidence of acute large territory infarct. No intraparenchymal or extra-axial mass or hemorrhage. Normal size and configuration of the ventricles and the basilar cisterns. No  midline shift. Vascular: Minimal amount of intracranial atherosclerosis. Skull: No displaced calvarial fracture. Sinuses/Orbits: Limited visualization of the paranasal sinuses and mastoid air cells is normal. No air-fluid levels. Other: Regional soft tissues appear normal. _________________________________________________________ CT CERVICAL SPINE FINDINGS Alignment: C1 to the superior endplate of T2 is imaged. There is straightening expected cervical lordosis. No anterolisthesis or retrolisthesis. The bilateral facets appear normally aligned. There is partial ankylosis of the bilateral C2-C3 transverse facets. Skull base and vertebrae: The dens is normally positioned between the lateral masses of C1. Normal atlantodental and atlantoaxial articulations. Soft tissues and spinal canal: Prevertebral soft tissues appear normal. Disc levels: There is partial ankylosis of anterior aspects of the C4-C5 and C5-C6 intervertebral disc spaces. Moderate multilevel cervical spine DDD, worse at C3-C4 with disc space height loss, endplate irregularity and small posteriorly directed disc osteophyte complex at this location. Upper chest: Limited visualization of the lung apices demonstrates grossly symmetric biapical pleuroparenchymal thickening. Other: The thyroid appears mildly enlarged and heterogeneous without discrete nodule on this noncontrast examination. No bulky cervical lymphadenopathy on this noncontrast examination. IMPRESSION: Head CT Impression: 1. Minimal microvascular ischemic disease without superimposed acute intracranial process. Cervical Spine CT Impression: 1. No fracture or static subluxation of the cervical spine. 2. Moderate multilevel cervical spine DDD, worse at C3-C4. 3. Partial ankylosis involving the anterior aspects of the C4-C5 and C5-C6 intervertebral disc spaces. Electronically Signed   By: Simonne Come M.D.   On: 01/27/2018 14:20   Mr Brain Wo Contrast  Result Date: 01/27/2018 CLINICAL DATA:   Altered mental status EXAM: MRI HEAD WITHOUT CONTRAST TECHNIQUE: Multiplanar, multiecho pulse sequences of the brain and surrounding structures were obtained without intravenous contrast. COMPARISON:  Head CT 01/27/2018 FINDINGS: BRAIN: There is no acute infarct, acute hemorrhage, hydrocephalus or extra-axial collection. The midline structures are normal. No midline shift or other mass effect. There are no old infarcts. The white matter signal is normal for the patient's age. The cerebral and cerebellar volume are age-appropriate. Susceptibility-sensitive sequences show no chronic microhemorrhage or superficial siderosis. VASCULAR: Major intracranial arterial and venous sinus flow voids are normal. SKULL AND UPPER CERVICAL SPINE: Calvarial bone marrow signal is normal. There is no skull base mass. Visualized upper cervical spine and soft tissues are normal. SINUSES/ORBITS: No fluid levels or advanced mucosal thickening. No mastoid or middle ear effusion. The orbits are normal. IMPRESSION: Normal aging brain. Electronically Signed   By: Deatra Robinson M.D.   On: 01/27/2018 22:39   Dg Pelvis Portable  Result Date: 01/27/2018 CLINICAL DATA:  84 year old female status post syncope, fall at home. EXAM: PORTABLE PELVIS 1-2 VIEWS COMPARISON:  Lumbar radiographs 08/31/2013. CT Abdomen and Pelvis 08/05/2011. FINDINGS: Portable AP supine view at 1215 hours. Femoral heads are normally located. Proximal femurs appear stable and intact. Pelvis appears stable and intact. Partially visible chronic lower lumbar fusion hardware. Negative visible lower abdominal and pelvic visceral contours, bowel gas. IMPRESSION: No acute fracture or dislocation identified about the pelvis. Electronically Signed   By: Odessa Fleming M.D.   On: 01/27/2018 13:16   Dg Chest Portable 1 View  Result Date: 01/27/2018 CLINICAL DATA:  84 year old female status post syncope, fall at home. Recent bilateral pulmonary emboli. EXAM: PORTABLE CHEST 1 VIEW  COMPARISON:  Chest CTA 01/20/2018 and earlier. FINDINGS: Portable AP supine  view at 1215 hours. Stable low lung volumes. Stable cardiac size and mediastinal contours. Streaky opacity at the right lung base has increased. No pneumothorax, pulmonary edema, pleural effusion or other confluent opacity. Visualized tracheal air column is within normal limits. Paucity of bowel gas in the upper abdomen. No acute osseous abnormality identified. IMPRESSION: 1. Continued low lung volumes. Increased right lung base opacity could reflect atelectasis or pulmonary infarct in this clinical setting. 2.  No acute traumatic injury identified. Electronically Signed   By: Odessa Fleming M.D.   On: 01/27/2018 13:16   Dg Knee Complete 4 Views Left  Result Date: 01/27/2018 CLINICAL DATA:  Syncopal episode and fall with bilateral knee pain, initial encounter EXAM: LEFT KNEE - COMPLETE 4+ VIEW COMPARISON:  01/25/2018 FINDINGS: Left knee prosthesis is again identified and stable in appearance. No acute fracture or dislocation is noted. No soft tissue changes are seen. IMPRESSION: Stable left knee prosthesis.  No acute abnormality noted. Electronically Signed   By: Alcide Clever M.D.   On: 01/27/2018 13:18   Dg Knee Complete 4 Views Right  Result Date: 01/27/2018 CLINICAL DATA:  Recent syncopal episode with fall and knee pain, initial encounter EXAM: RIGHT KNEE - COMPLETE 4+ VIEW COMPARISON:  None. FINDINGS: Right knee prosthesis is noted. No acute fracture or dislocation is seen. No loosening is identified. No soft tissue changes are noted. IMPRESSION: No acute abnormality noted. Electronically Signed   By: Alcide Clever M.D.   On: 01/27/2018 13:19    Time Spent in minutes  30   Susa Raring M.D on 01/28/2018 at 10:25 AM  To page go to [email or web address] - password [facility]

## 2018-01-28 NOTE — Consult Note (Addendum)
Neurology Consultation  Reason for Consult: Syncope versus seizure Referring Physician: Thedore MinsSingh  CC: Syncope versus seizure  History is obtained from: [name]  HPI: [name] is a 84 y.o. female significant for Parkinson's and syncope.  Patient has had 3 events to which she has had syncopal events and possible seizure.  Per [name] [name] first event occurred when she was on [name] toilet and vagal down.  Second event was when she was on [name] toilet and apparently got dizzy reached for [name] bar at [name] side of [name] bathroom and fell.  This recent event she was sitting on her bed, getting up to use her walker-[name] was behind her, usually she is in front of her- and patient went down to her knees and then fell on her face.  [name] believes that she was out for approximately 3 to 4 minutes.  She was very tired afterwards.  She noticed a tremor but it looked very much like her Parkinson's tremor.  Of note patient walks with a walker.   ED course: CT head, CT cervical spine, MRI brain, labs   Chart review : She does see Dr. Arlana [facility]ate [facility].  Per her notes: "Patient was last seen in 10/2016 and is present at today's appointment with her [name] [name]. Since that time, she has continued to do well. She primarily walks with a cane, though for longer distances she uses a walker or sometimes a wheelchair. She has not had any recent falls. She has been in PT for [name] past 2 months due to back issues, and she thinks this has improved her symptoms.   She has tremors in BUE, left worse than right. She believes her tremor and stiffness responds to sinemet, which she takes four times per day (Sinemet 25-100mg  5 tabs 4x daily) in addition to Amantadine 100mg  QHS, and Primidone 100mg  QHS. She denies side effects or dyskinesias. Her [name] is concerned about [name] patient's occasional dysphagia on liquids, though this is unchanged from [name] last appointment and has not lead to choking events,  pneumonias, or other issues. She is sleeping well and denies hallucinations.  Current Movement Disorder Medication: Sinemet 25-100 mg 5 tablets 4 times per day Amantadine 100mg  QHS Primidone 100mg  QHS  "   ROS:  ROS was performed and is negative except as noted in [name] HPI.   PMHX . Degenerative joint disease  . Depression  . Exogenous obesity  . Hypertension  . Parkinson disease, symptomatic (HCC)      No family history on file.   Social History:   has no tobacco, alcohol, and drug history on file.  Medications  Current Facility-Administered Medications:  .  0.9 %  sodium chloride infusion, , Intravenous, Continuous, Hijazi, Ali, MD, Last Rate: 100 mL/hr at 01/28/18 1109 .  0.9 %  sodium chloride infusion, , Intravenous, PRN, Carron CurieHijazi, Ali, MD, Last Rate: 10 mL/hr at 01/28/18 0647, 100 mL at 01/28/18 0647 .  amLODipine (NORVASC) tablet 10 mg, 10 mg, Oral, Daily, 10 mg at 01/28/18 1049 **AND** irbesartan (AVAPRO) tablet 300 mg, 300 mg, Oral, Daily, Hijazi, Ali, MD, 300 mg at 01/28/18 1050 .  apixaban (ELIQUIS) tablet 10 mg, 10 mg, Oral, BID, Carron CurieHijazi, Ali, MD, 10 mg at 01/28/18 1049 .  carbidopa-levodopa (SINEMET IR) 25-100 MG per tablet immediate release 4 tablet, 4 tablet, Oral, QID, Carron CurieHijazi, Ali, MD, 4 tablet at 01/28/18 1049 .  famotidine (PEPCID) tablet 20 mg, 20 mg, Oral, BID, Carron CurieHijazi, Ali, MD, 20 mg at  01/28/18 1049 .  feeding supplement (BOOST / RESOURCE BREEZE) liquid 1 Container, 1 Container, Oral, TID BM, Carron Curie, MD, 1 Container at 01/28/18 1054 .  FLUoxetine (PROZAC) capsule 40 mg, 40 mg, Oral, Daily, Carron Curie, MD, 40 mg at 01/28/18 1053 .  folic acid (FOLVITE) tablet 1 mg, 1 mg, Oral, Daily, Hijazi, Ali, MD, 1 mg at 01/28/18 1049 .  MEDLINE mouth rinse, 15 mL, Mouth Rinse, BID, Carron Curie, MD, 15 mL at 01/28/18 1111 .  ondansetron (ZOFRAN) tablet 4 mg, 4 mg, Oral, Q6H PRN **OR** ondansetron (ZOFRAN) injection 4 mg, 4 mg, Intravenous, Q6H PRN, Carron Curie, MD, 4  mg at 01/27/18 2326 .  pantoprazole (PROTONIX) EC tablet 40 mg, 40 mg, Oral, Daily, Carron Curie, MD, 40 mg at 01/28/18 1048 .  pravastatin (PRAVACHOL) tablet 20 mg, 20 mg, Oral, Daily, Hijazi, Ali, MD, 20 mg at 01/28/18 1048 .  traZODone (DESYREL) tablet 50 mg, 50 mg, Oral, QHS PRN, Carron Curie, MD .  Melene Muller ON 02/02/2018] Vitamin D (Ergocalciferol) (DRISDOL) capsule 50,000 Units, 50,000 Units, Oral, Weekly, Carron Curie, MD   Exam: Current vital signs: BP 125/64   Pulse 73   Temp 98.7 F (37.1 C) (Oral)   Resp 13   Ht 5\' 6"  (1.676 m)   Wt 116 kg   SpO2 99%   BMI 41.28 kg/m  Vital signs in last 24 hours: Temp:  [98.1 F (36.7 C)-99 F (37.2 C)] 98.7 F (37.1 C) (11/13 1113) Pulse Rate:  [73-107] 73 (11/13 1113) Resp:  [12-24] 13 (11/13 1113) BP: (124-149)/(49-89) 125/64 (11/13 1048) SpO2:  [94 %-100 %] 99 % (11/13 1113) Weight:  [161 kg] 116 kg (11/13 1213)  Physical Exam  Constitutional: Appears well-developed and well-nourished.  Psych: Affect appropriate to situation Eyes: No scleral injection HENT: No OP obstrucion Head: Normocephalic.  Cardiovascular: Normal rate and regular rhythm.  Respiratory: Effort normal, non-labored breathing GI: Soft.  No distension. There is no tenderness.  Skin: WDI  Neuro: Mental Status: Patient is awake, alert, oriented to person, place, event but [name] gives better history Patient is able to give some history And is able to follow commands Cranial Nerves: II: Visual Fields are full.    III,IV, VI: EOMI without ptosis or diploplia. Pupils are equal, round, and reactive to light. V: Facial sensation is symmetric to temperature VII: Facial movement is symmetric.  Patient does have hypomania VIII: hearing is intact to voice X: Uvula elevates symmetrically XI: Shoulder shrug is symmetric. XII: tongue is midline without atrophy or fasciculations.  Motor: Increased tone bilateral arms and legs. Bulk is normal. 5/5 strength was  present in all four extremities. Sensory: Sensation is symmetric to light touch and temperature in [name] arms and legs.  Patient does have peripheral neuropathy bilateral feet to mid calf Deep Tendon Reflexes: 2+ and symmetric upper extremities with no knee jerk or ankle jerk Plantars: Toes are downgoing bilaterally.  Cerebellar: FNF with no dysmetria however she does have a postural and action tremor noted   Labs I have reviewed labs in epic and [name] results pertinent to this consultation are:   CBC    Component Value Date/Time   WBC 7.0 01/27/2018 1242   RBC 4.14 01/27/2018 1242   HGB 11.1 (L) 01/27/2018 1242   HCT 37.0 01/27/2018 1242   PLT 324 01/27/2018 1242   MCV 89.4 01/27/2018 1242   MCH 26.8 01/27/2018 1242   MCHC 30.0 01/27/2018 1242   RDW 18.5 (H) 01/27/2018 1242  LYMPHSABS 2.5 01/27/2018 1242   MONOABS 1.1 (H) 01/27/2018 1242   EOSABS 0.3 01/27/2018 1242   BASOSABS 0.1 01/27/2018 1242    CMP     Component Value Date/Time   NA 141 01/28/2018 0503   K 3.2 (L) 01/28/2018 0503   CL 109 01/28/2018 0503   CO2 24 01/28/2018 0503   GLUCOSE 86 01/28/2018 0503   BUN 8 01/28/2018 0503   CREATININE 1.03 (H) 01/28/2018 0503   CALCIUM 8.7 (L) 01/28/2018 0503   PROT 6.9 01/27/2018 1242   ALBUMIN 3.7 01/27/2018 1242   AST 16 01/27/2018 1242   ALT <5 01/27/2018 1242   ALKPHOS 106 01/27/2018 1242   BILITOT 0.9 01/27/2018 1242   GFRNONAA 50 (L) 01/28/2018 0503   GFRAA 57 (L) 01/28/2018 0503    Lipid Panel  No results found for: CHOL, TRIG, HDL, CHOLHDL, VLDL, LDLCALC, LDLDIRECT   Awaiting orthostatic blood pressures  Imaging I have reviewed [name] images obtained:  CT-scan of [name] brain- minimal microvascular ischemia disease without superimposed acute intracranial process  MRI examination of [name] brain- normal  EEG: Pending final reading  Felicie Morn PA-C [facility] [phone]  M-F  (9:00 am- 5:00 PM)  01/28/2018, 12:45 PM      Assessment:  84 year old female with 2 syncopal events when she was on [name] commode most likely vagal.  This past event occurred after she got up from [name] bed walked couple steps.  This was different secondary to patient being altered mental status for approximately 4 minutes.  At this time is difficult to say but possibly could be seizure.  Due to this being [name] first event and unclear would not start antiepileptic medication.   Impression   Syncope, less likely seizure  Recommendations: - Fall precautions - EEG - Orthostatic blood pressures - Have patient follow-up with Dr. Arlana Pouch her primary neurologist for Parkinson's    NEUROHOSPITALIST ADDENDUM Performed a face to face diagnostic evaluation.   I have reviewed [name] contents of history and physical exam as documented by PA/ARNP/Resident and agree with above documentation.  I have discussed and formulated [name] above plan as documented. Edits to [name] note have been made as needed.  I spoke with [name] of [name] who witnessed [name] event over [name] phone.  She states that patient was walking outside of [name] bathroom to [name] living room when she suddenly stated " woah" and slowly buckled onto her knees and bent towards [name] floor.  She remained unresponsive for about 4 minutes.  Normally [name] walks in front of her but this time was behind her and could not prevent her from falling.  There is no tonic posturing, tonic-clonic jerking. Initially a tongue bite was described, however it appears [name] patient hurt her lip.  There is no prolonged postictal state.  Patient briefly remembers [name] event before passing out. She has had multiple episodes of vasovagal syncope in [name] past usually from sitting to standing position when using [name] bathroom.  She has never had a seizure in [name] past.  MRI brain was negative for any acute stroke/tumor /mass lesions with no particular pathology for increased risks of seizures.  Doubt that this was a  seizure, most likely orthostatic syncope.  Even if this were a seizure-given that it is a first time I would not start on any antiepileptic agents.   Patient can be discharged from my standpoint.  Follow-up with her outpatient neurologist Dr. Ovidio Kin Aroor MD [facility] [phone]   If 7pm to 7am, please call on call as listed on AMION.

## 2018-01-28 NOTE — Evaluation (Signed)
Physical Therapy Evaluation Patient Details Name: [name] MRN: [id number] DOB: [birth year] Today's Date: 01/28/2018   History of Present Illness  Pt is an 84 y/o female admitted secondary to sustaining a witnessed fall at home. Pt found to have lactic acidosis. Of note, pt recently admitted on 01/19/18 secondary to PNA and PE. PMH including but not limited to Parkinson's.    Clinical Impression  Pt presented supine in bed with HOB elevated, awake and willing to participate in therapy session. Prior to admission, pt reported that she ambulated with use of RW and required assistance for ADLs. Pt lives with her daughter in a single level home with no steps to enter. Pt also has a lift chair that assists with transfers. Pt currently very limited secondary to fatigue and weakness. Pt requires max A for bed mobility and close min guard with bilateral UE supports to sit EOB for ~5 minutes prior to requesting to lie back down secondary to fatigue. Pt on RA throughout with SPO2 maintaining in the mid 90's. PT currently recommending pt d/c to SNF for short-term rehab prior to returning home with family support. Pt would continue to benefit from skilled physical therapy services at this time while admitted and after d/c to address the below listed limitations in order to improve overall safety and independence with functional mobility.     Follow Up Recommendations SNF;Supervision/Assistance - 24 hour;Other (comment)(if pt/family refuses, will need continued HHPT and 24/7 A)    Equipment Recommendations  None recommended by PT    Recommendations for Other Services       Precautions / Restrictions Precautions Precautions: Fall Precaution Comments: two recent falls Restrictions Weight Bearing Restrictions: No      Mobility  Bed Mobility Overal bed mobility: Needs Assistance Bed Mobility: Supine to Sit;Sit to Supine     Supine to sit: Max assist Sit to supine: Max assist   General bed  mobility comments: increased time and effort, cueing for sequencing, assist with bilateral LE movement off of and back onto bed, assistance needed for trunk elevation  Transfers                 General transfer comment: despite encouragement from PT, pt declining transfer at this time and requesting to return to supine secondary to fatigue  Ambulation/Gait                Stairs            Wheelchair Mobility    Modified Rankin (Stroke Patients Only)       Balance Overall balance assessment: Needs assistance Sitting-balance support: Feet supported;Bilateral upper extremity supported Sitting balance-Leahy Scale: Poor Sitting balance - Comments: close min guard for safety; pt tolerated sitting EOB for ~5 minutes prior to requesting to return to sitting Postural control: Posterior lean                                   Pertinent Vitals/Pain Pain Assessment: No/denies pain    Home Living Family/patient expects to be discharged to:: Private residence Living Arrangements: Children Available Help at Discharge: Family;Available 24 hours/day Type of Home: House Home Access: Level entry     Home Layout: One level Home Equipment: Walker - 2 wheels(lift chair, adjustable bed) Additional Comments: has an aide 2x/week for "just long enough for a bath" per family report    Prior Function Level of Independence: Needs assistance  Gait / Transfers Assistance Needed: pt ambulates with use of RW and stand by assist   ADL's / Homemaking Assistance Needed: requires assistance for ADLs from family or aide, pt also requires assistance with feeding herself        Hand Dominance   Dominant Hand: Right    Extremity/Trunk Assessment   Upper Extremity Assessment Upper Extremity Assessment: Generalized weakness    Lower Extremity Assessment Lower Extremity Assessment: Generalized weakness    Cervical / Trunk Assessment Cervical / Trunk Assessment:  Kyphotic  Communication   Communication: No difficulties  Cognition Arousal/Alertness: Awake/alert Behavior During Therapy: Anxious Overall Cognitive Status: Impaired/Different from baseline Area of Impairment: Memory;Following commands;Safety/judgement;Problem solving                     Memory: Decreased short-term memory Following Commands: Follows one step commands with increased time Safety/Judgement: Decreased awareness of deficits;Decreased awareness of safety   Problem Solving: Slow processing;Decreased initiation;Difficulty sequencing;Requires verbal cues;Requires tactile cues        General Comments      Exercises     Assessment/Plan    PT Assessment Patient needs continued PT services  PT Problem List Decreased strength;Decreased activity tolerance;Decreased balance;Decreased mobility;Decreased coordination;Decreased cognition;Decreased knowledge of use of DME;Decreased safety awareness;Decreased knowledge of precautions;Cardiopulmonary status limiting activity       PT Treatment Interventions DME instruction;Gait training;Functional mobility training;Therapeutic activities;Therapeutic exercise;Balance training;Cognitive remediation;Neuromuscular re-education;Patient/family education    PT Goals (Current goals can be found in the Care Plan section)  Acute Rehab PT Goals Patient Stated Goal: to get better PT Goal Formulation: With patient/family Time For Goal Achievement: 02/11/18 Potential to Achieve Goals: Fair    Frequency Min 3X/week   Barriers to discharge        Co-evaluation               AM-PAC PT "6 Clicks" Daily Activity  Outcome Measure Difficulty turning over in bed (including adjusting bedclothes, sheets and blankets)?: Unable Difficulty moving from lying on back to sitting on the side of the bed? : Unable Difficulty sitting down on and standing up from a chair with arms (e.g., wheelchair, bedside commode, etc,.)?: Unable Help  needed moving to and from a bed to chair (including a wheelchair)?: A Lot Help needed walking in hospital room?: A Lot Help needed climbing 3-5 steps with a railing? : Total 6 Click Score: 8    End of Session   Activity Tolerance: Patient limited by fatigue Patient left: in bed;with call bell/phone within reach;with bed alarm set;with family/visitor present Nurse Communication: Mobility status PT Visit Diagnosis: Other abnormalities of gait and mobility (R26.89);Muscle weakness (generalized) (M62.81)    Time: [id number] PT Time Calculation (min) (ACUTE ONLY): 21 min   Charges:   PT Evaluation $PT Eval Moderate Complexity: 1 Mod          Deborah ChalkJennifer Oden Lindaman, PT, DPT  Acute Rehabilitation Services Pager [phone] Office [phone]    Alessandra BevelsJennifer M Emileo Semel 01/28/2018, 10:09 AM

## 2018-01-28 NOTE — Progress Notes (Signed)
Initial Nutrition Assessment  DOCUMENTATION CODES:   Obesity unspecified  INTERVENTION:  -Boost Breeze po BID, each supplement provides 250 kcal and 9 grams of protein  -Ensure Enlive once daily, each supplement provides 350 kcal and 20 grams protein  -MVI daily   NUTRITION DIAGNOSIS:   Inadequate oral intake related to decreased appetite as evidenced by per patient/family report.  GOAL:   Patient will meet greater than or equal to 90% of their needs  MONITOR:   PO intake, Supplement acceptance, Labs, Weight trends  REASON FOR ASSESSMENT:   Malnutrition Screening Tool    ASSESSMENT:   Ms. Henreitta LeberBridges is an 84 yo female with PMH of Parkinson's disease, recent admission for PNA and PE, frequent UTIs, admitted for lactic acidosis.   Visited pt at bedside with daughters present. Pt reports poor appetite ongoing for the past month. Per pt and family she was hospitalized for pneumonia around a month ago and has not had an appetite since.   Pt lives at home with one of her daughters. During visit daughter asking for nurse to replace bag of potassium which was stopped when pt left for EEG.  Diet recall: Breakfast: cereal or oatmeal or grits and [place] bacon  Lunch: pt usually has late breakfast Dinner: daughter cooks or they go to [employer], per daughter pt only eats bites Beverages: milk, lemonade, tea, some ice water  Supplements: vitamin D  Pt uses walker at home. She is not very active and reports spending most of day watching television.   Pt says she is not aware of wt loss. Pt's UBW is 260-270 lbs. No previous wt hx available for pt per chart; obtained bed wt of 255 lbs.   Pt's diet progressed to regular this AM but has not received meal yet. Encouraged her to try to eat at meals. Pt likes the Boost Breeze supplement so will keep them but reduce to BID; pt also amenable to Ensure so will add once daily.   Meds: pepcid, Boost Breeze, folic acid, protonix, vitamin D 50,000 IU  weekly Labs: potassium 3.2, GFR 57  NUTRITION - FOCUSED PHYSICAL EXAM:    Most Recent Value  Orbital Region  No depletion  Upper Arm Region  No depletion  Thoracic and Lumbar Region  No depletion  Buccal Region  No depletion  Temple Region  Mild depletion  Clavicle Bone Region  No depletion  Clavicle and Acromion Bone Region  Mild depletion  Scapular Bone Region  Unable to assess  Dorsal Hand  Mild depletion  Patellar Region  Mild depletion  Anterior Thigh Region  Mild depletion  Posterior Calf Region  Mild depletion  Edema (RD Assessment)  Mild  Hair  Reviewed  Eyes  Reviewed  Mouth  Reviewed  Skin  Reviewed  Nails  Reviewed      Diet Order:   Diet Order            Diet regular Room service appropriate? Yes; Fluid consistency: Thin  Diet effective now              EDUCATION NEEDS:   Education needs have been addressed  Skin:  Skin Assessment: Reviewed RN Assessment  Last BM:  11/11 per patient  Height:   Ht Readings from Last 1 Encounters:  01/27/18 5\' 6"  (1.676 m)    Weight:   Wt Readings from Last 1 Encounters:  01/28/18 116 kg    Ideal Body Weight:  59 kg  BMI:  Body mass index is 41.28  kg/m.  Estimated Nutritional Needs:   Kcal:  1800-2100  Protein:  90-105 gm  Fluid:  >/=1.8 L    Ayris Carano, Dietetic Intern [phone]

## 2018-01-28 NOTE — Evaluation (Signed)
Clinical/Bedside Swallow Evaluation Patient Details  Name: [name] GivenJoan M Spitzley MRN: [id number] Date of Birth: 04/06/1933  Today's Date: 01/28/2018 Time: SLP Start Time (ACUTE ONLY): 1135 SLP Stop Time (ACUTE ONLY): 1154 SLP Time Calculation (min) (ACUTE ONLY): 19 min  Past Medical History: No past medical history on file. Past Surgical History:  The histories are not reviewed yet. Please review them in the "History" navigator section and refresh this SmartLink. HPI:  Pt is an 84 y/o female admitted secondary to sustaining a witnessed fall at home. Pt found to have lactic acidosis. Of note, pt recently admitted on 01/19/18 secondary to PNA and PE. PMH including but not limited to Parkinson's.    Assessment / Plan / Recommendation Clinical Impression  Pt's oropharyngeal swallow appears grossly functional given additional time and assistance with feeding. She subjectively describes trouble with pills getting "stuck" and intermittent coughing with thin liquids. Pt believes she takes her pills better when they are crushed in applesauce. Given pt report of GER, hiatal hernia, question a primary esophageal component. Recommend advancing diet to regular textures and thin liquids, but would offer meds crushed in puree. Given subjective reports reported by pt but not visualized during clinical assessment, will f/u briefly for tolerance.   SLP Visit Diagnosis: Dysphagia, unspecified (R13.10)    Aspiration Risk  Mild aspiration risk    Diet Recommendation Regular;Thin liquid   Liquid Administration via: Cup;Straw Medication Administration: Crushed with puree Supervision: Staff to assist with self feeding Compensations: Slow rate;Small sips/bites;Follow solids with liquid Postural Changes: Seated upright at 90 degrees;Remain upright for at least 30 minutes after po intake    Other  Recommendations Oral Care Recommendations: Oral care BID   Follow up Recommendations None      Frequency and Duration min  1 x/week  1 week       Prognosis        Swallow Study   General HPI: Pt is an 84 y/o female admitted secondary to sustaining a witnessed fall at home. Pt found to have lactic acidosis. Of note, pt recently admitted on 01/19/18 secondary to PNA and PE. PMH including but not limited to Parkinson's.  Type of Study: Bedside Swallow Evaluation Previous Swallow Assessment: none in chart Diet Prior to this Study: Thin liquids Temperature Spikes Noted: No Respiratory Status: Room air History of Recent Intubation: No Behavior/Cognition: Alert;Cooperative;Pleasant mood Oral Cavity Assessment: Within Functional Limits Oral Care Completed by SLP: No Oral Cavity - Dentition: Dentures, top;Other (Comment)(natural dentition on bottom) Vision: Functional for self-feeding Self-Feeding Abilities: Needs assist(tremor) Patient Positioning: Upright in bed Baseline Vocal Quality: Low vocal intensity(mild) Volitional Cough: Strong Volitional Swallow: Able to elicit    Oral/Motor/Sensory Function Overall Oral Motor/Sensory Function: (mild, generalized reduced ROM)   Ice Chips Ice chips: Not tested   Thin Liquid Thin Liquid: Within functional limits Presentation: Cup;Self Fed;Straw    Nectar Thick Nectar Thick Liquid: Not tested   Honey Thick Honey Thick Liquid: Not tested   Puree Puree: Within functional limits Presentation: Spoon   Solid     Solid: Within functional limits Presentation: Self Georjean ModeFed      Paiewonsky, Allyssia Skluzacek 01/28/2018,12:43 PM  Maxcine HamLaura Paiewonsky, M.A. CCC-SLP Acute Herbalistehabilitation Services Pager [phone] Office [phone]

## 2018-01-28 NOTE — Progress Notes (Signed)
EEG completed; results pending.    

## 2018-01-29 ENCOUNTER — Observation Stay (HOSPITAL_BASED_OUTPATIENT_CLINIC_OR_DEPARTMENT_OTHER): Payer: Medicare Other

## 2018-01-29 ENCOUNTER — Observation Stay (HOSPITAL_COMMUNITY): Payer: Medicare Other

## 2018-01-29 ENCOUNTER — Encounter (HOSPITAL_COMMUNITY): Payer: Self-pay

## 2018-01-29 DIAGNOSIS — R55 Syncope and collapse: Secondary | ICD-10-CM | POA: Diagnosis not present

## 2018-01-29 DIAGNOSIS — I5030 Unspecified diastolic (congestive) heart failure: Secondary | ICD-10-CM

## 2018-01-29 DIAGNOSIS — E872 Acidosis: Secondary | ICD-10-CM | POA: Diagnosis not present

## 2018-01-29 LAB — BASIC METABOLIC PANEL
ANION GAP: 9 (ref 5–15)
BUN: 12 mg/dL (ref 8–23)
CHLORIDE: 109 mmol/L (ref 98–111)
CO2: 21 mmol/L — AB (ref 22–32)
CREATININE: 1.03 mg/dL — AB (ref 0.44–1.00)
Calcium: 8.5 mg/dL — ABNORMAL LOW (ref 8.9–10.3)
GFR calc Af Amer: 57 mL/min — ABNORMAL LOW (ref >60)
GFR calc non Af Amer: 50 mL/min — ABNORMAL LOW (ref >60)
GLUCOSE: 93 mg/dL (ref 70–99)
Potassium: 3.4 mmol/L — ABNORMAL LOW (ref 3.5–5.1)
Sodium: 139 mmol/L (ref 135–145)

## 2018-01-29 LAB — CBC
HEMATOCRIT: 33 % — AB (ref 36.0–46.0)
Hemoglobin: 10.5 g/dL — ABNORMAL LOW (ref 12.0–15.0)
MCH: 27.6 pg (ref 26.0–34.0)
MCHC: 31.8 g/dL (ref 30.0–36.0)
MCV: 86.6 fL (ref 80.0–100.0)
NRBC: 0 % (ref 0.0–0.2)
Platelets: 277 10*3/uL (ref 150–400)
RBC: 3.81 MIL/uL — ABNORMAL LOW (ref 3.87–5.11)
RDW: 18.7 % — ABNORMAL HIGH (ref 11.5–15.5)
WBC: 6.7 10*3/uL (ref 4.0–10.5)

## 2018-01-29 LAB — MAGNESIUM: Magnesium: 1.7 mg/dL (ref 1.7–2.4)

## 2018-01-29 LAB — ECHOCARDIOGRAM COMPLETE
Height: 66 in
Weight: 4091.74 oz

## 2018-01-29 MED ORDER — HALOPERIDOL LACTATE 5 MG/ML IJ SOLN: 1.0000 mg | Freq: Four times a day (QID) | INTRAMUSCULAR | Status: DC | PRN

## 2018-01-29 NOTE — Procedures (Signed)
ELECTROENCEPHALOGRAM REPORT   Patient: [name]       Room #: 5W09W5W08C EEG No. ID: [id number] Age: 84 y.o.        Sex: female Referring Physician: Hijazi Report Date:  01/29/2018        Interpreting Physician: Thana FarrEYNOLDS, Haaris Metallo  History: [name] GivenJoan M Beed is an 84 y.o. female with altered mental status  Medications:  Norvasc, Eliquis, Sinemet, Pepcid, Prozac, Folvite, Avapro, MVI, Protonix, Pravachol, Drisdol  Conditions of Recording:  This is a 21 channel routine scalp EEG performed with bipolar and monopolar montages arranged in accordance to the international 10/20 system of electrode placement. One channel was dedicated to EKG recording.  The patient is in the awake and drowsy states.  Description:  The waking background activity consists of a low voltage, symmetrical, fairly well organized, 7 Hz theta activity, seen from the parieto-occipital and posterior temporal regions.  Low voltage fast activity, poorly organized, is seen anteriorly and is at times superimposed on more posterior regions.  A mixture of theta and alpha rhythms are seen from the central and temporal regions. The patient drowses with slowing to irregular, low voltage theta and beta activity.   Stage II sleep is not obtained. No epileptiform activity is noted.   Hyperventilation and intermittent photic stimulation were not performed.   IMPRESSION: This is an abnormal EEG secondary to mild posterior background slowing.  This finding may be seen with a diffuse gray matter disturbance that is etiologically nonspecific, but may include a dementia, among other possibilities. No epileptiform activity is noted.     Thana FarrLeslie Weylin Plagge, MD Neurology [phone] 01/29/2018, 2:22 PM

## 2018-01-29 NOTE — Progress Notes (Signed)
  Speech Language Pathology Treatment: Dysphagia  Patient Details Name: [name] MRN: [id number] DOB: 03/17/1934 Today's Date: 01/29/2018 Time: 1350-1405 SLP Time Calculation (min) (ACUTE ONLY): 15 min  Assessment / Plan / Recommendation Clinical Impression  Pt seen at bedside for assessment of diet tolerance and education regarding swallow safety. Pt required encouragement to eat come of her lunch, stating she doesn't have much of an appetite. Pt eventually ate 1/2 of her grilled cheese sandwich, and drank thin liquids via straw. No oral difficulty or overt s/s aspiration observed on either consistency. Pt declined additional po trials. ST will sign off at this time. Please reconsult if needs arise.    HPI HPI: Pt is an 84 y/o female admitted secondary to sustaining a witnessed fall at home. Pt found to have lactic acidosis. Of note, pt recently admitted on 01/19/18 secondary to PNA and PE. PMH including but not limited to Parkinson's.       SLP Plan  Discharge SLP treatment due to goals met       Recommendations  Diet recommendations: Regular;Thin liquid Liquids provided via: Cup;Straw Medication Administration: Crushed with puree Supervision: Patient able to self feed Compensations: Slow rate;Small sips/bites;Follow solids with liquid Postural Changes and/or Swallow Maneuvers: Seated upright 90 degrees                Oral Care Recommendations: Oral care BID Follow up Recommendations: None SLP Visit Diagnosis: Dysphagia, unspecified (R13.10) Plan: Discharge SLP treatment due to (comment)       GO           [name] [facility], CCC-SLP Speech Language Pathologist [phone]  [name] 01/29/2018, 2:21 PM

## 2018-01-29 NOTE — Care Management Note (Addendum)
Case Management Note  Patient Details  Name: [name] MRN: [id number] Date of Birth: 10/07/1933  Subjective/Objective:   Syncope. Hx of Parkinson's disease, recent admission for pneumonia and pulmonary embolism, freq. UTIs. From home with daughter, [name]. [facility].      Laruth BouchardJohanna [name] (Daughter)     [phone]      PCP: [name]  Action/Plan: Transition to home with home health services to follow. Pt declines SNF placement. Resides with daughter , supportive family. Pt will probably need PTAR arranged for transportation to home.  Expected Discharge Date:                  Expected Discharge Plan:  Home w Home Health Services  In-House Referral:  NA, Clinical Social Work(Pt declines SNF)  Discharge planning Services  CM Consult  Post Acute Care Choice:  Resumption of Svcs/PTA Provider, Home Health Choice offered to:  Patient, Adult Children  DME Arranged:   hoyer lift DME Agency:  [facility]  HH Arranged:  PT, OT, RN, Nurse's Aide HH Agency:  Other - See comment([name])  Status of Service:  Completed, signed off  If discussed at Long Length of Stay Meetings, dates discussed:    Additional Comments:  Epifanio LeschesCole, Kyzer Blowe Hudson, RN 01/29/2018, 10:39 AM

## 2018-01-29 NOTE — Progress Notes (Signed)
Physical Therapy Treatment Patient Details Name: [name] MRN: [id number] DOB: 10/23/1933 Today's Date: 01/29/2018    History of Present Illness Pt is an 84 y/o female with a PMH consisting of Parkinson's was admitted to the ED  secondary to a witnessed fall at home. Pt found to have lactic acidosis. Pt recently admitted on 01/19/18 secondary to PNA and PE and d/c 01/22/2018.     PT Comments    Pt presents sitting in recliner chair and alert. Pt states willingness to participate in PT. Pt is overall max A x2 with transfers. Pt vital signs signs are of note during the session. HR rose from 97 BPM in sitting to a high of 122 BPM during standing. During standing, pt displayed increased anxiousness while holding her breath.  BP dropped from 131/75 mmHg during sitting to 112/68 mmHg during sitting after standing. Continued recommendation of SNF with 24/7 support due to decreased strength and overall max A transfer level. Pt would continue to benefit from skilled PT in order to increase strength and functional mobility while decreasing skin breakdown and contracture risk.  Follow Up Recommendations  SNF;Supervision/Assistance - 24 hour(if pt/family refuses, will need continued HHPT and 24/7 A)     Equipment Recommendations  None recommended by PT    Recommendations for Other Services       Precautions / Restrictions Precautions Precautions: Fall Precaution Comments: two recent falls Restrictions Weight Bearing Restrictions: No    Mobility  Bed Mobility Overal bed mobility: (Deferred, pt sitting in chair  )                Transfers Overall transfer level: Needs assistance Equipment used: Rolling walker (2 wheeled) Transfers: Sit to/from Stand Sit to Stand: Max assist;+2 physical assistance         General transfer comment: Pt required initiating power and sustained assistance for stability. Pt performed sit to stand x2 from the chair. Pt required verbal cueing for hand  placement. Pt required LE placement by therapist in order to achieve appropriate foot positioning.   Ambulation/Gait                 Stairs             Wheelchair Mobility    Modified Rankin (Stroke Patients Only)       Balance Overall balance assessment: Needs assistance Sitting-balance support: Feet supported;Bilateral upper extremity supported Sitting balance-Leahy Scale: Poor Sitting balance - Comments: sitting balance in chair Postural control: Posterior lean Standing balance support: Bilateral upper extremity supported;During functional activity Standing balance-Leahy Scale: Poor Standing balance comment: Pt requires 2 person max A with BUE support with walker to maintain standing balance. Pt demonstrates posterior lean during standing.                             Cognition Arousal/Alertness: Awake/alert Behavior During Therapy: Anxious Overall Cognitive Status: Impaired/Different from baseline Area of Impairment: Awareness;Problem solving;Following commands;Safety/judgement;Memory;Attention                   Current Attention Level: Sustained Memory: Decreased short-term memory Following Commands: Follows one step commands consistently;Follows one step commands with increased time;Follows multi-step commands inconsistently Safety/Judgement: Decreased awareness of safety;Decreased awareness of deficits Awareness: Emergent Problem Solving: Slow processing;Decreased initiation;Requires verbal cues;Difficulty sequencing;Requires tactile cues        Exercises General Exercises - Lower Extremity Quad Sets: Both;5 reps;Seated Gluteal Sets: Both;5 reps;Seated Long Arc Quad: Both;5  reps;Seated    General Comments        Pertinent Vitals/Pain Pain Assessment: No/denies pain    Home Living                      Prior Function            PT Goals (current goals can now be found in the care plan section) Acute Rehab PT  Goals Patient Stated Goal: to get better PT Goal Formulation: With patient/family Time For Goal Achievement: 02/11/18 Potential to Achieve Goals: Fair Progress towards PT goals: Progressing toward goals    Frequency    Min 3X/week      PT Plan Current plan remains appropriate    Co-evaluation              AM-PAC PT "6 Clicks" Daily Activity  Outcome Measure  Difficulty turning over in bed (including adjusting bedclothes, sheets and blankets)?: Unable Difficulty moving from lying on back to sitting on the side of the bed? : Unable Difficulty sitting down on and standing up from a chair with arms (e.g., wheelchair, bedside commode, etc,.)?: Unable Help needed moving to and from a bed to chair (including a wheelchair)?: A Lot Help needed walking in hospital room?: Total Help needed climbing 3-5 steps with a railing? : Total 6 Click Score: 7    End of Session Equipment Utilized During Treatment: Gait belt Activity Tolerance: Patient limited by fatigue;Other (comment)(decrease in BP) Patient left: in chair;with call bell/phone within reach;with nursing/sitter in room;with family/visitor present;with chair alarm set Nurse Communication: Mobility status PT Visit Diagnosis: Other abnormalities of gait and mobility (R26.89);Muscle weakness (generalized) (M62.81)     Time: 0454-0981 PT Time Calculation (min) (ACUTE ONLY): 36 min  Charges:  $Therapeutic Exercise: 8-22 mins $Therapeutic Activity: 8-22 mins                     Rolm Bookbinder, SPT Acute Rehab [phone] (pager) [phone] (office)   Kennadie Brenner 01/29/2018, 4:57 PM

## 2018-01-29 NOTE — Care Management Obs Status (Signed)
MEDICARE OBSERVATION STATUS NOTIFICATION   Patient Details  Name: [name] MRN: [id number] Date of Birth: 05/22/1933   Medicare Observation Status Notification Given:       Epifanio LeschesCole, Clovia Reine Hudson, RN 01/29/2018, 10:36 AM

## 2018-01-29 NOTE — Progress Notes (Signed)
PROGRESS NOTE                                                                                                                                                                                                             Patient Demographics:    [name], is a 84 y.o. female, DOB - [birth year], WGN:[id number]  Admit date - 01/27/2018   Admitting Physician Carron Curie, MD  Outpatient Primary MD for the patient is Zola Button, Grayling Congress, DO  LOS - 1  No chief complaint on file.      Brief Narrative  [name]  is a 84 y.o. female, with past medical history significant for Parkinson's disease, recent admission for pneumonia and pulmonary embolism, frequent UTIs presenting after a witnessed fall at home when she was ambulating with her daughter, she apparently fell lost consciousness for 45 minutes and had a small tongue bite.  She came to the ER where her head CT was unremarkable.  She had a similar episode during her recent hospitalization which was blamed on vagal nerve stimulation, she had a negative EEG that admission.  She was admitted for further syncope work-up.   Subjective:   Patient in bed, appears comfortable but overall looks tired and fatigued as she did not sleep well last night, denies any headache, no fever, no chest pain or pressure, no shortness of breath , no abdominal pain. No focal weakness.    Assessment  & Plan :      1.  Syncope.  Appears to be recurrent.  Could be vasovagal due to autonomic instability from underlying Parkinson's however this time she had slightly elevated lactate and tongue bite, will be prudent to make sure she did not have a seizure, CT head and MRI brain are nonacute.  Have requested neuro to evaluate. She is not orthostatic but she has pending EEG and echocardiogram, stable on telemetry will get a 30-day event monitor upon discharge as well, she and family refused SNF want to go home tomorrow with HHPT.   2.  Parkinson's.  Home  medications continued.  3.  HX of PE.  On Eliquis.  4.  Essential hypertension.  Continue combination of Norvasc and ACE inhibitor.  5.  GERD.  On PPI continue.  6.  Dyslipidemia.  On statin.  7.  Mildly elevated lactate due to dehydration.  Resolved after IV fluids.    Family Communication  :  Daughter  Code Status :  Full  Disposition Plan  :  HHPT in am  Consults  :  Neuro  Procedures  :    CT head and C-spine.  Nonacute.  MRI brain.  Nonacute  EEG -   Echocardiogram -   DVT Prophylaxis  :  Eliquis  Lab Results  Component Value Date   PLT 277 01/29/2018    Diet :  Diet Order            Diet regular Room service appropriate? Yes; Fluid consistency: Thin  Diet effective now               Inpatient Medications Scheduled Meds: . amLODipine  10 mg Oral Daily   And  . irbesartan  300 mg Oral Daily  . apixaban  10 mg Oral BID  . [START ON 01/30/2018] apixaban  5 mg Oral BID  . carbidopa-levodopa  4 tablet Oral QID  . famotidine  20 mg Oral BID  . feeding supplement  1 Container Oral BID BM  . feeding supplement (ENSURE ENLIVE)  237 mL Oral Q24H  . FLUoxetine  40 mg Oral Daily  . folic acid  1 mg Oral Daily  . mouth rinse  15 mL Mouth Rinse BID  . multivitamin with minerals  1 tablet Oral Daily  . pantoprazole  40 mg Oral Daily  . pravastatin  20 mg Oral Daily  . [START ON 02/02/2018] Vitamin D (Ergocalciferol)  50,000 Units Oral Weekly   Continuous Infusions: . sodium chloride 100 mL/hr at 01/29/18 0010  . sodium chloride 100 mL (01/28/18 0647)   PRN Meds:.sodium chloride, ondansetron **OR** ondansetron (ZOFRAN) IV, traZODone  Antibiotics  :   Anti-infectives (From admission, onward)   None          Objective:   Vitals:   01/28/18 2123 01/29/18 0010 01/29/18 0456 01/29/18 0950  BP:  119/72 139/66   Pulse: 86 81  (!) 109  Resp: 19 19 19    Temp:  99 F (37.2 C) 98.8 F (37.1 C)   TempSrc:  Oral Oral   SpO2: 96% 93%    Weight:       Height:        Wt Readings from Last 3 Encounters:  01/28/18 116 kg     Intake/Output Summary (Last 24 hours) at 01/29/2018 1148 Last data filed at 01/29/2018 0950 Gross per 24 hour  Intake 2039.07 ml  Output 350 ml  Net 1689.07 ml     Physical Exam  Awake but feels sleepy as she did not sleep well last night, No new F.N deficits,   [place].AT,PERRAL Supple Neck,No JVD, No cervical lymphadenopathy appriciated.  Symmetrical Chest wall movement, Good air movement bilaterally, CTAB RRR,No Gallops, Rubs or new Murmurs, No Parasternal Heave +ve B.Sounds, Abd Soft, No tenderness, No organomegaly appriciated, No rebound - guarding or rigidity. No Cyanosis, Clubbing or edema, No new Rash or bruise     Data Review:    CBC Recent Labs  Lab 01/27/18 1242 01/29/18 0628  WBC 7.0 6.7  HGB 11.1* 10.5*  HCT 37.0 33.0*  PLT 324 277  MCV 89.4 86.6  MCH 26.8 27.6  MCHC 30.0 31.8  RDW 18.5* 18.7*  LYMPHSABS 2.5  --   MONOABS 1.1*  --   EOSABS 0.3  --   BASOSABS 0.1  --     Chemistries  Recent Labs  Lab 01/27/18 1242 01/28/18 0503 01/29/18 0628  NA 141 141 139  K 2.8* 3.2* 3.4*  CL 106 109 109  CO2 15* 24 21*  GLUCOSE 108* 86 93  BUN 10 8 12   CREATININE 1.18* 1.03* 1.03*  CALCIUM 9.0 8.7* 8.5*  MG  --  2.0 1.7  AST 16  --   --   ALT <5  --   --   ALKPHOS 106  --   --   BILITOT 0.9  --   --    ------------------------------------------------------------------------------------------------------------------ No results for input(s): CHOL, HDL, LDLCALC, TRIG, CHOLHDL, LDLDIRECT in the last 72 hours.  No results found for: HGBA1C ------------------------------------------------------------------------------------------------------------------ Recent Labs    01/27/18 1721  TSH 1.957   ------------------------------------------------------------------------------------------------------------------ No results for input(s): VITAMINB12, FOLATE, FERRITIN, TIBC, IRON,  RETICCTPCT in the last 72 hours.  Coagulation profile Recent Labs  Lab 01/27/18 1242  INR 2.09    No results for input(s): DDIMER in the last 72 hours.  Cardiac Enzymes Recent Labs  Lab 01/27/18 1721 01/27/18 2308 01/28/18 0503  TROPONINI <0.03 <0.03 <0.03   ------------------------------------------------------------------------------------------------------------------    Component Value Date/Time   BNP 28.0 01/27/2018 1242    Micro Results Recent Results (from the past 240 hour(s))  MRSA PCR Screening     Status: None   Collection Time: 01/27/18  6:59 PM  Result Value Ref Range Status   MRSA by PCR NEGATIVE NEGATIVE Final    Comment:        The GeneXpert MRSA Assay (FDA approved for NASAL specimens only), is one component of a comprehensive MRSA colonization surveillance program. It is not intended to diagnose MRSA infection nor to guide or monitor treatment for MRSA infections. Performed at [facility], [address] [place], [place] [address]     Radiology Reports Ct Head Wo Contrast  Result Date: 01/27/2018 CLINICAL DATA:  Witnessed fall while walking. EXAM: CT HEAD WITHOUT CONTRAST CT CERVICAL SPINE WITHOUT CONTRAST TECHNIQUE: Multidetector CT imaging of the head and cervical spine was performed following the standard protocol without intravenous contrast. Multiplanar CT image reconstructions of the cervical spine were also generated. COMPARISON:  Head CT - 01/19/2018; 12/30/2017 FINDINGS: CT HEAD FINDINGS Brain: Scattered minimal periventricular hypodensities, left greater than right. No CT evidence of acute large territory infarct. No intraparenchymal or extra-axial mass or hemorrhage. Normal size and configuration of the ventricles and the basilar cisterns. No midline shift. Vascular: Minimal amount of intracranial atherosclerosis. Skull: No displaced calvarial fracture. Sinuses/Orbits: Limited visualization of the paranasal sinuses and mastoid air  cells is normal. No air-fluid levels. Other: Regional soft tissues appear normal. _________________________________________________________ CT CERVICAL SPINE FINDINGS Alignment: C1 to the superior endplate of T2 is imaged. There is straightening expected cervical lordosis. No anterolisthesis or retrolisthesis. The bilateral facets appear normally aligned. There is partial ankylosis of the bilateral C2-C3 transverse facets. Skull base and vertebrae: The dens is normally positioned between the lateral masses of C1. Normal atlantodental and atlantoaxial articulations. Soft tissues and spinal canal: Prevertebral soft tissues appear normal. Disc levels: There is partial ankylosis of anterior aspects of the C4-C5 and C5-C6 intervertebral disc spaces. Moderate multilevel cervical spine DDD, worse at C3-C4 with disc space height loss, endplate irregularity and small posteriorly directed disc osteophyte complex at this location. Upper chest: Limited visualization of the lung apices demonstrates grossly symmetric biapical pleuroparenchymal thickening. Other: The thyroid appears mildly enlarged and heterogeneous without discrete nodule on this noncontrast examination. No bulky cervical lymphadenopathy on this noncontrast examination. IMPRESSION: Head CT Impression: 1. Minimal microvascular ischemic disease without superimposed acute intracranial process. Cervical Spine CT Impression: 1. No fracture or static subluxation of the cervical  spine. 2. Moderate multilevel cervical spine DDD, worse at C3-C4. 3. Partial ankylosis involving the anterior aspects of the C4-C5 and C5-C6 intervertebral disc spaces. Electronically Signed   By: Simonne Come M.D.   On: 01/27/2018 14:20   Ct Cervical Spine Wo Contrast  Result Date: 01/27/2018 CLINICAL DATA:  Witnessed fall while walking. EXAM: CT HEAD WITHOUT CONTRAST CT CERVICAL SPINE WITHOUT CONTRAST TECHNIQUE: Multidetector CT imaging of the head and cervical spine was performed  following the standard protocol without intravenous contrast. Multiplanar CT image reconstructions of the cervical spine were also generated. COMPARISON:  Head CT - 01/19/2018; 12/30/2017 FINDINGS: CT HEAD FINDINGS Brain: Scattered minimal periventricular hypodensities, left greater than right. No CT evidence of acute large territory infarct. No intraparenchymal or extra-axial mass or hemorrhage. Normal size and configuration of the ventricles and the basilar cisterns. No midline shift. Vascular: Minimal amount of intracranial atherosclerosis. Skull: No displaced calvarial fracture. Sinuses/Orbits: Limited visualization of the paranasal sinuses and mastoid air cells is normal. No air-fluid levels. Other: Regional soft tissues appear normal. _________________________________________________________ CT CERVICAL SPINE FINDINGS Alignment: C1 to the superior endplate of T2 is imaged. There is straightening expected cervical lordosis. No anterolisthesis or retrolisthesis. The bilateral facets appear normally aligned. There is partial ankylosis of the bilateral C2-C3 transverse facets. Skull base and vertebrae: The dens is normally positioned between the lateral masses of C1. Normal atlantodental and atlantoaxial articulations. Soft tissues and spinal canal: Prevertebral soft tissues appear normal. Disc levels: There is partial ankylosis of anterior aspects of the C4-C5 and C5-C6 intervertebral disc spaces. Moderate multilevel cervical spine DDD, worse at C3-C4 with disc space height loss, endplate irregularity and small posteriorly directed disc osteophyte complex at this location. Upper chest: Limited visualization of the lung apices demonstrates grossly symmetric biapical pleuroparenchymal thickening. Other: The thyroid appears mildly enlarged and heterogeneous without discrete nodule on this noncontrast examination. No bulky cervical lymphadenopathy on this noncontrast examination. IMPRESSION: Head CT Impression: 1.  Minimal microvascular ischemic disease without superimposed acute intracranial process. Cervical Spine CT Impression: 1. No fracture or static subluxation of the cervical spine. 2. Moderate multilevel cervical spine DDD, worse at C3-C4. 3. Partial ankylosis involving the anterior aspects of the C4-C5 and C5-C6 intervertebral disc spaces. Electronically Signed   By: Simonne Come M.D.   On: 01/27/2018 14:20   Mr Brain Wo Contrast  Result Date: 01/27/2018 CLINICAL DATA:  Altered mental status EXAM: MRI HEAD WITHOUT CONTRAST TECHNIQUE: Multiplanar, multiecho pulse sequences of the brain and surrounding structures were obtained without intravenous contrast. COMPARISON:  Head CT 01/27/2018 FINDINGS: BRAIN: There is no acute infarct, acute hemorrhage, hydrocephalus or extra-axial collection. The midline structures are normal. No midline shift or other mass effect. There are no old infarcts. The white matter signal is normal for the patient's age. The cerebral and cerebellar volume are age-appropriate. Susceptibility-sensitive sequences show no chronic microhemorrhage or superficial siderosis. VASCULAR: Major intracranial arterial and venous sinus flow voids are normal. SKULL AND UPPER CERVICAL SPINE: Calvarial bone marrow signal is normal. There is no skull base mass. Visualized upper cervical spine and soft tissues are normal. SINUSES/ORBITS: No fluid levels or advanced mucosal thickening. No mastoid or middle ear effusion. The orbits are normal. IMPRESSION: Normal aging brain. Electronically Signed   By: Deatra Robinson M.D.   On: 01/27/2018 22:39   Dg Pelvis Portable  Result Date: 01/27/2018 CLINICAL DATA:  84 year old female status post syncope, fall at home. EXAM: PORTABLE PELVIS 1-2 VIEWS COMPARISON:  Lumbar radiographs 08/31/2013. CT Abdomen  and Pelvis 08/05/2011. FINDINGS: Portable AP supine view at 1215 hours. Femoral heads are normally located. Proximal femurs appear stable and intact. Pelvis appears  stable and intact. Partially visible chronic lower lumbar fusion hardware. Negative visible lower abdominal and pelvic visceral contours, bowel gas. IMPRESSION: No acute fracture or dislocation identified about the pelvis. Electronically Signed   By: Odessa Fleming M.D.   On: 01/27/2018 13:16   Dg Chest Portable 1 View  Result Date: 01/27/2018 CLINICAL DATA:  84 year old female status post syncope, fall at home. Recent bilateral pulmonary emboli. EXAM: PORTABLE CHEST 1 VIEW COMPARISON:  Chest CTA 01/20/2018 and earlier. FINDINGS: Portable AP supine view at 1215 hours. Stable low lung volumes. Stable cardiac size and mediastinal contours. Streaky opacity at the right lung base has increased. No pneumothorax, pulmonary edema, pleural effusion or other confluent opacity. Visualized tracheal air column is within normal limits. Paucity of bowel gas in the upper abdomen. No acute osseous abnormality identified. IMPRESSION: 1. Continued low lung volumes. Increased right lung base opacity could reflect atelectasis or pulmonary infarct in this clinical setting. 2.  No acute traumatic injury identified. Electronically Signed   By: Odessa Fleming M.D.   On: 01/27/2018 13:16   Dg Knee Complete 4 Views Left  Result Date: 01/27/2018 CLINICAL DATA:  Syncopal episode and fall with bilateral knee pain, initial encounter EXAM: LEFT KNEE - COMPLETE 4+ VIEW COMPARISON:  01/25/2018 FINDINGS: Left knee prosthesis is again identified and stable in appearance. No acute fracture or dislocation is noted. No soft tissue changes are seen. IMPRESSION: Stable left knee prosthesis.  No acute abnormality noted. Electronically Signed   By: Alcide Clever M.D.   On: 01/27/2018 13:18   Dg Knee Complete 4 Views Right  Result Date: 01/27/2018 CLINICAL DATA:  Recent syncopal episode with fall and knee pain, initial encounter EXAM: RIGHT KNEE - COMPLETE 4+ VIEW COMPARISON:  None. FINDINGS: Right knee prosthesis is noted. No acute fracture or dislocation  is seen. No loosening is identified. No soft tissue changes are noted. IMPRESSION: No acute abnormality noted. Electronically Signed   By: Alcide Clever M.D.   On: 01/27/2018 13:19    Time Spent in minutes  30   Susa Raring M.D on 01/29/2018 at 11:48 AM  To page go to [email or web address] - password [email or web address]

## 2018-01-29 NOTE — Progress Notes (Signed)
  Echocardiogram 2D Echocardiogram has been performed.  [name] 01/29/2018, 12:43 PM 

## 2018-01-29 NOTE — Procedures (Signed)
Echo attempted. Pt receiving nursing care. Will attempt again later.

## 2018-01-29 NOTE — Care Management CC44 (Signed)
Condition Code 44 Documentation Completed  Patient Details  Name: [name] MRN: [id number] Date of Birth: 01/11/1934   Condition Code 44 given:  Yes Patient signature on Condition Code 44 notice:  Yes Documentation of 2 MD's agreement:  Yes Code 44 added to claim:  Yes    Epifanio Leschesole, Veryl Winemiller Hudson, RN 01/29/2018, 10:37 AM

## 2018-01-29 NOTE — Progress Notes (Signed)
PT Progress Note for Charges    01/29/18 1600  PT General Charges  $$ ACUTE PT VISIT 1 Visit  PT Treatments  $Therapeutic Exercise 8-22 mins  $Therapeutic Activity 8-22 mins  Deborah ChalkJennifer Shirleen Mcfaul, PT, DPT  Acute Rehabilitation Services Pager [phone] Office [phone]

## 2018-01-30 ENCOUNTER — Telehealth: Payer: Self-pay

## 2018-01-30 DIAGNOSIS — E872 Acidosis: Secondary | ICD-10-CM | POA: Diagnosis not present

## 2018-01-30 DIAGNOSIS — R55 Syncope and collapse: Secondary | ICD-10-CM | POA: Diagnosis not present

## 2018-01-30 MED ORDER — POTASSIUM CHLORIDE CRYS ER 20 MEQ PO TBCR
40.0000 meq | EXTENDED_RELEASE_TABLET | Freq: Once | ORAL | Status: AC
  Administered 2018-01-30: 40 meq via ORAL
  Filled 2018-01-30: qty 2

## 2018-01-30 MED ORDER — MAGNESIUM SULFATE IN D5W 1-5 GM/100ML-% IV SOLN
1.0000 g | Freq: Once | INTRAVENOUS | Status: AC
  Administered 2018-01-30: 1 g via INTRAVENOUS
  Filled 2018-01-30: qty 100

## 2018-01-30 NOTE — Discharge Instructions (Signed)
Follow with Primary MD Zola ButtonLowne Chase, Grayling CongressYvonne R, DO in 7 days   Get CBC, CMP, Magnesium checked  by Primary MD  in 5-7 days    Activity: As tolerated with Full fall precautions use walker/cane & assistance as needed  Disposition Home    Diet: Heart Healthy     Special Instructions: If you have smoked or chewed Tobacco  in the last 2 yrs please stop smoking, stop any regular Alcohol  and or any Recreational drug use.  On your next visit with your primary care physician please Get Medicines reviewed and adjusted.  Please request your Prim.MD to go over all Hospital Tests and Procedure/Radiological results at the follow up, please get all Hospital records sent to your Prim MD by signing hospital release before you go home.  If you experience worsening of your admission symptoms, develop shortness of breath, life threatening emergency, suicidal or homicidal thoughts you must seek medical attention immediately by calling 911 or calling your MD immediately  if symptoms less severe.  You Must read complete instructions/literature along with all the possible adverse reactions/side effects for all the Medicines you take and that have been prescribed to you. Take any new Medicines after you have completely understood and accpet all the possible adverse reactions/side effects.

## 2018-01-30 NOTE — Care Management Note (Signed)
Case Management Note  Patient Details  Name: [name] MRN: [id number] Date of Birth: 10/19/1933  Subjective/Objective:     Syncope, vasovagal.       [name] (Daughter) [name] (Daughter)    [phone]       Action/Plan: Transition to home with home health services to resume. [facility] PTAR services arranged for pt pick up, transportation to home.  Expected Discharge Date:  01/30/18               Expected Discharge Plan:  Home w Home Health Services  In-House Referral:  NA, Clinical Social Work(Pt declines SNF)  Discharge planning Services  CM Consult  Post Acute Care Choice:  Resumption of Svcs/PTA Provider, Home Health Choice offered to:  Patient, Adult Children  DME Arranged:  Other see comment(hoyer lift) DME Agency:  [facility]  HH Arranged:  PT, OT, RN, Nurse's Aide HH Agency:  Other - See comment([name])  Status of Service:  Completed, signed off  If discussed at Long Length of Stay Meetings, dates discussed:    Additional Comments:  Epifanio LeschesCole, Kristen Bushway Hudson, RN 01/30/2018, 11:12 AM

## 2018-01-30 NOTE — Progress Notes (Signed)
Physical Therapy Treatment Patient Details Name: [name] MRN: [id number] DOB: 06/04/1933 Today's Date: 01/30/2018    History of Present Illness Pt is an 84 y/o female with a PMH consisting of Parkinson's was admitted to the ED  secondary to a witnessed fall at home. Pt found to have lactic acidosis. Pt recently admitted on 01/19/18 secondary to PNA and PE and d/c 01/22/2018.     PT Comments    Pt making progress with bed mobility this session; however, very limited secondary to elevated BP once sitting EOB (BP = 151/120(130) mmHg and then with re-take BP = 134/118(124) mmHg). All other VSS. Pt's RN was notified. PT continuing to recommend pt d/c to SNF for further intensive therapy services prior to returning home with family support as she continues to require heavy physical assistance of two for all mobility.   Pt would continue to benefit from skilled physical therapy services at this time while admitted and after d/c to address the below listed limitations in order to improve overall safety and independence with functional mobility.    Follow Up Recommendations  SNF;Supervision/Assistance - 24 hour     Equipment Recommendations  None recommended by PT    Recommendations for Other Services       Precautions / Restrictions Precautions Precautions: Fall Precaution Comments: two recent falls Restrictions Weight Bearing Restrictions: No    Mobility  Bed Mobility Overal bed mobility: Needs Assistance Bed Mobility: Supine to Sit;Sit to Supine     Supine to sit: Mod assist;+2 for physical assistance Sit to supine: Max assist;+2 for physical assistance   General bed mobility comments: increased time and effort, verbal and tactile cueing for sequencing, assist to move bilateral LEs off of and back onto bed, assist with trunk elevation; use of bed pads to position pt's hips at EOB  Transfers                 General transfer comment: deferred secondary to elevated BP  in sitting (151/120(130) mmHg and (134/118(124) mmHg - RN was notified  Ambulation/Gait                 Stairs             Wheelchair Mobility    Modified Rankin (Stroke Patients Only)       Balance Overall balance assessment: Needs assistance Sitting-balance support: Feet supported;Bilateral upper extremity supported Sitting balance-Leahy Scale: Poor Sitting balance - Comments: pt able to sit EOB with bilateral UE supports and progressing from mod A to min A                                    Cognition Arousal/Alertness: Awake/alert Behavior During Therapy: Flat affect Overall Cognitive Status: Impaired/Different from baseline Area of Impairment: Following commands;Safety/judgement;Problem solving                       Following Commands: Follows one step commands inconsistently;Follows one step commands with increased time Safety/Judgement: Decreased awareness of safety;Decreased awareness of deficits   Problem Solving: Slow processing;Decreased initiation;Difficulty sequencing;Requires verbal cues;Requires tactile cues        Exercises      General Comments        Pertinent Vitals/Pain Pain Assessment: Faces Faces Pain Scale: No hurt    Home Living  Prior Function            PT Goals (current goals can now be found in the care plan section) Acute Rehab PT Goals PT Goal Formulation: With patient/family Time For Goal Achievement: 02/11/18 Potential to Achieve Goals: Fair Progress towards PT goals: Progressing toward goals    Frequency    Min 3X/week      PT Plan Current plan remains appropriate    Co-evaluation              AM-PAC PT "6 Clicks" Daily Activity  Outcome Measure  Difficulty turning over in bed (including adjusting bedclothes, sheets and blankets)?: Unable Difficulty moving from lying on back to sitting on the side of the bed? : Unable Difficulty sitting  down on and standing up from a chair with arms (e.g., wheelchair, bedside commode, etc,.)?: Unable Help needed moving to and from a bed to chair (including a wheelchair)?: A Lot Help needed walking in hospital room?: Total Help needed climbing 3-5 steps with a railing? : Total 6 Click Score: 7    End of Session   Activity Tolerance: Patient limited by fatigue;Treatment limited secondary to medical complications (Comment)(elevated BP in sitting) Patient left: in bed;with call bell/phone within reach;with bed alarm set;with family/visitor present Nurse Communication: Mobility status;Other (comment)(elevated BP in sitting) PT Visit Diagnosis: Other abnormalities of gait and mobility (R26.89);Muscle weakness (generalized) (M62.81)     Time: 1610-9604 PT Time Calculation (min) (ACUTE ONLY): 18 min  Charges:  $Therapeutic Activity: 8-22 mins                     Deborah Chalk, PT, DPT  Acute Rehabilitation Services Pager [phone] Office [phone]     Alessandra Bevels Jasiya Markie 01/30/2018, 10:28 AM

## 2018-01-30 NOTE — Telephone Encounter (Signed)
01/30/18  Transition Care Management Follow-up Telephone Call  ADMISSION DATE: 01/27/18 DISCHARGE DATE: 01/30/18  *Needs BMP, CBC, 2 view CXR   How have you been since you were released from the hospital?  Per daughter patient cannot walk well ,has poor appetite.    Do you understand why you were in the hospital? Yes per daughter.   Do you understand the discharge instrcutions? Yes per daughter    Items Reviewed:  Medications reviewed: Yes   Allergies reviewed: Yes  Dietary changes reviewed: Low sodium Heart healthy   Referrals reviewed: Appointment scheduled for Hospital follow up in 1 week. Home Health to come to home.   Functional Questionnaire:   Activities of Daily Living (ADLs): Patient needs help with all ADL's at this time.  Any patient concerns?  Daughter wants repeat U/A to make sure UTI has cleared.   Confirmed importance and date/time of follow-up visits scheduled:Yes   Confirmed with patient if condition begins to worsen call PCP or go to the ER. Yes   Patient was given the office number and encouragred to call back with questions or concerns. Yes

## 2018-01-30 NOTE — Discharge Summary (Signed)
[name] ZOX:[id number] DOB: 11-15-1933 DOA: 01/27/2018  PCP: Donato Schultz, DO  Admit date: 01/27/2018  Discharge date: 01/30/2018  Admitted From: Home   Disposition:  Home ( refused SNF despite counseling)   Recommendations for Outpatient Follow-up:   Follow up with PCP in 1-2 weeks  PCP Please obtain BMP/CBC, 2 view CXR in 1week,  (see Discharge instructions)   PCP Please follow up on the following pending results:    Home Health: PT,RN, S Work   Equipment/Devices: Have a walker  Consultations: Neuro Discharge Condition: Fair   CODE STATUS: Full   Diet Recommendation: Heart Healthy    CC - Syncope  Brief history of present illness from the day of admission and additional interim summary    [name] a84 y.o.female,with past medical history significant for Parkinson's disease, recent admission for pneumonia and pulmonary embolism, frequent UTIs presenting after a witnessed fall at home when she was ambulating with her daughter, she apparently fell lost consciousness for 45 minutes and had a small tongue bite.  She came to the ER where her head CT was unremarkable.  She had a similar episode during her recent hospitalization which was blamed on vagal nerve stimulation, she had a negative EEG that admission.  She was admitted for further syncope work-up.                                                                 Hospital Course   1.  Syncope.    Recurrent and most likely vasovagal due to underlying Parkinson's, she had a nonspecific elevation of lactate and a small tongue bite but no other evidence of a seizure, EEG was negative, she was seen by neurology who thought that patient had vasovagal event.  CT head and MRI brain were unremarkable.  Telemetry monitoring was stable.  Echo was  unremarkable and she was not orthostatic.  She will also get a 30-day event monitor upon discharge and she will follow with her neurologist within 1 to 2 weeks of discharge.  Note patient was strictly recommended to go to an SNF but her and her family refused.  Home health PT will be ordered.  Request PCP to strictly evaluate the risks and benefits of Eliquis if falls recur.  I warned her daughter that her risks of getting a serious head injury and head bleed are high with her being on Eliquis and falling.  She understands the risk but still would like to take at home rather than SNF.     2.  Parkinson's.  Home medications continued.  3.  HX of PE.  On Eliquis.  ECP to reevaluate and reconsider risks and benefits based on future clinical scenario and falls.  4.  Essential hypertension.  Continue combination of Norvasc and ACE inhibitor.  5.  GERD.  On PPI continue.  6.  Dyslipidemia.  On statin.  7.  Mildly elevated lactate due to dehydration.  Resolved after IV fluids.     Discharge diagnosis     Principal Problem:   Syncope, vasovagal Active Problems:   Lactic acidosis   Parkinson disease (HCC)   Essential hypertension   Dyslipidemia   GERD (gastroesophageal reflux disease)   Pulmonary embolism (HCC)    Discharge instructions    Discharge Instructions    Diet - low sodium heart healthy   Complete by:  As directed    Discharge instructions   Complete by:  As directed    Follow with Primary MD [name], Grayling CongressYvonne R, DO in 7 days   Get CBC, CMP, Magnesium checked  by Primary MD  in 5-7 days    Activity: As tolerated with Full fall precautions use walker/cane & assistance as needed  Disposition Home    Diet: Heart Healthy     Special Instructions: If you have smoked or chewed Tobacco  in the last 2 yrs please stop smoking, stop any regular Alcohol  and or any Recreational drug use.  On your next visit with your primary care physician please Get Medicines  reviewed and adjusted.  Please request your Prim.MD to go over all Hospital Tests and Procedure/Radiological results at the follow up, please get all Hospital records sent to your Prim MD by signing hospital release before you go home.  If you experience worsening of your admission symptoms, develop shortness of breath, life threatening emergency, suicidal or homicidal thoughts you must seek medical attention immediately by calling 911 or calling your MD immediately  if symptoms less severe.  You Must read complete instructions/literature along with all the possible adverse reactions/side effects for all the Medicines you take and that have been prescribed to you. Take any new Medicines after you have completely understood and accpet all the possible adverse reactions/side effects.   Increase activity slowly   Complete by:  As directed       Discharge Medications   Allergies as of 01/30/2018      Reactions   Codeine Nausea And Vomiting   Gabapentin Other (See Comments)   Caused bilateral feet swelling   Latex Other (See Comments)   Burns the skin   Morphine Sulfate Nausea And Vomiting   Penicillins    Has patient had a PCN reaction causing immediate rash, facial/tongue/throat swelling, SOB or lightheadedness with hypotension: No Has patient had a PCN reaction causing severe rash involving mucus membranes or skin necrosis: No Has patient had a PCN reaction that required hospitalization: No Has patient had a PCN reaction occurring within the last 10 years: No If all of the above answers are "NO", then may proceed with Cephalosporin use.   Sertraline Other (See Comments)   unknown      Medication List    STOP taking these medications   nitrofurantoin (macrocrystal-monohydrate) 100 MG capsule Commonly known as:  MACROBID     TAKE these medications   amantadine 100 MG capsule Commonly known as:  SYMMETREL Take 100 mg by mouth 2 (two) times daily.   amLODipine-valsartan 10-320  MG tablet Commonly known as:  EXFORGE Take 1 tablet by mouth daily.   carbidopa-levodopa 25-100 MG tablet Commonly known as:  SINEMET IR Take 4 tablets by mouth 4 (four) times daily.   ELIQUIS 5 MG Tabs tablet Generic drug:  apixaban Take 10 mg by mouth 2 (two) times daily.  famotidine 20 MG tablet Commonly known as:  PEPCID Take 20 mg by mouth 2 (two) times daily.   FLUoxetine 40 MG capsule Commonly known as:  PROZAC Take 40 mg by mouth daily.   folic acid 1 MG tablet Commonly known as:  FOLVITE Take 1 mg by mouth daily.   OLANZapine 10 MG tablet Commonly known as:  ZYPREXA Take 10 mg by mouth daily.   pantoprazole 40 MG tablet Commonly known as:  PROTONIX Take 40 mg by mouth daily.   pravastatin 20 MG tablet Commonly known as:  PRAVACHOL Take 20 mg by mouth daily.   traZODone 50 MG tablet Commonly known as:  DESYREL Take 50 mg by mouth at bedtime as needed.   Vitamin D (Ergocalciferol) 1.25 MG (50000 UT) Caps capsule Commonly known as:  DRISDOL Take 50,000 Units by mouth once a week.       Follow-up Information    Donato Schultz, DO. Schedule an appointment as soon as possible for a visit in 1 week(s).   Specialty:  Family Medicine Why:  And your neurologist within a week Contact information: [address] [place] [address] [phone]           Major procedures and Radiology Reports - PLEASE review detailed and final reports thoroughly  -     CT head and C-spine.  Nonacute.  MRI brain.  Nonacute  EEG - no epileptiform focus.  Echocardiogram - - Left ventricle: The cavity size was normal. Wall thickness was normal. Systolic function was normal. The estimated ejection fraction was in the range of 60% to 65%. Wall motion was normal; there were no regional wall motion abnormalities. Doppler  parameters are consistent with abnormal left ventricular relaxation (grade 1 diastolic dysfunction). - Aortic valve: Mildly to  moderately calcified annulus. - Right atrium: The atrium was mildly dilated. - Pulmonary arteries: PA peak pressure: 32 mm Hg (S).   Ct Head Wo Contrast  Result Date: 01/27/2018 CLINICAL DATA:  Witnessed fall while walking. EXAM: CT HEAD WITHOUT CONTRAST CT CERVICAL SPINE WITHOUT CONTRAST TECHNIQUE: Multidetector CT imaging of the head and cervical spine was performed following the standard protocol without intravenous contrast. Multiplanar CT image reconstructions of the cervical spine were also generated. COMPARISON:  Head CT - 01/19/2018; 12/30/2017 FINDINGS: CT HEAD FINDINGS Brain: Scattered minimal periventricular hypodensities, left greater than right. No CT evidence of acute large territory infarct. No intraparenchymal or extra-axial mass or hemorrhage. Normal size and configuration of the ventricles and the basilar cisterns. No midline shift. Vascular: Minimal amount of intracranial atherosclerosis. Skull: No displaced calvarial fracture. Sinuses/Orbits: Limited visualization of the paranasal sinuses and mastoid air cells is normal. No air-fluid levels. Other: Regional soft tissues appear normal. _________________________________________________________ CT CERVICAL SPINE FINDINGS Alignment: C1 to the superior endplate of T2 is imaged. There is straightening expected cervical lordosis. No anterolisthesis or retrolisthesis. The bilateral facets appear normally aligned. There is partial ankylosis of the bilateral C2-C3 transverse facets. Skull base and vertebrae: The dens is normally positioned between the lateral masses of C1. Normal atlantodental and atlantoaxial articulations. Soft tissues and spinal canal: Prevertebral soft tissues appear normal. Disc levels: There is partial ankylosis of anterior aspects of the C4-C5 and C5-C6 intervertebral disc spaces. Moderate multilevel cervical spine DDD, worse at C3-C4 with disc space height loss, endplate irregularity and small posteriorly directed disc  osteophyte complex at this location. Upper chest: Limited visualization of the lung apices demonstrates grossly symmetric biapical pleuroparenchymal thickening.  Other: The thyroid appears mildly enlarged and heterogeneous without discrete nodule on this noncontrast examination. No bulky cervical lymphadenopathy on this noncontrast examination. IMPRESSION: Head CT Impression: 1. Minimal microvascular ischemic disease without superimposed acute intracranial process. Cervical Spine CT Impression: 1. No fracture or static subluxation of the cervical spine. 2. Moderate multilevel cervical spine DDD, worse at C3-C4. 3. Partial ankylosis involving the anterior aspects of the C4-C5 and C5-C6 intervertebral disc spaces. Electronically Signed   By: Simonne Come M.D.   On: 01/27/2018 14:20   Ct Cervical Spine Wo Contrast  Result Date: 01/27/2018 CLINICAL DATA:  Witnessed fall while walking. EXAM: CT HEAD WITHOUT CONTRAST CT CERVICAL SPINE WITHOUT CONTRAST TECHNIQUE: Multidetector CT imaging of the head and cervical spine was performed following the standard protocol without intravenous contrast. Multiplanar CT image reconstructions of the cervical spine were also generated. COMPARISON:  Head CT - 01/19/2018; 12/30/2017 FINDINGS: CT HEAD FINDINGS Brain: Scattered minimal periventricular hypodensities, left greater than right. No CT evidence of acute large territory infarct. No intraparenchymal or extra-axial mass or hemorrhage. Normal size and configuration of the ventricles and the basilar cisterns. No midline shift. Vascular: Minimal amount of intracranial atherosclerosis. Skull: No displaced calvarial fracture. Sinuses/Orbits: Limited visualization of the paranasal sinuses and mastoid air cells is normal. No air-fluid levels. Other: Regional soft tissues appear normal. _________________________________________________________ CT CERVICAL SPINE FINDINGS Alignment: C1 to the superior endplate of T2 is imaged. There is  straightening expected cervical lordosis. No anterolisthesis or retrolisthesis. The bilateral facets appear normally aligned. There is partial ankylosis of the bilateral C2-C3 transverse facets. Skull base and vertebrae: The dens is normally positioned between the lateral masses of C1. Normal atlantodental and atlantoaxial articulations. Soft tissues and spinal canal: Prevertebral soft tissues appear normal. Disc levels: There is partial ankylosis of anterior aspects of the C4-C5 and C5-C6 intervertebral disc spaces. Moderate multilevel cervical spine DDD, worse at C3-C4 with disc space height loss, endplate irregularity and small posteriorly directed disc osteophyte complex at this location. Upper chest: Limited visualization of the lung apices demonstrates grossly symmetric biapical pleuroparenchymal thickening. Other: The thyroid appears mildly enlarged and heterogeneous without discrete nodule on this noncontrast examination. No bulky cervical lymphadenopathy on this noncontrast examination. IMPRESSION: Head CT Impression: 1. Minimal microvascular ischemic disease without superimposed acute intracranial process. Cervical Spine CT Impression: 1. No fracture or static subluxation of the cervical spine. 2. Moderate multilevel cervical spine DDD, worse at C3-C4. 3. Partial ankylosis involving the anterior aspects of the C4-C5 and C5-C6 intervertebral disc spaces. Electronically Signed   By: Simonne Come M.D.   On: 01/27/2018 14:20   Mr Brain Wo Contrast  Result Date: 01/27/2018 CLINICAL DATA:  Altered mental status EXAM: MRI HEAD WITHOUT CONTRAST TECHNIQUE: Multiplanar, multiecho pulse sequences of the brain and surrounding structures were obtained without intravenous contrast. COMPARISON:  Head CT 01/27/2018 FINDINGS: BRAIN: There is no acute infarct, acute hemorrhage, hydrocephalus or extra-axial collection. The midline structures are normal. No midline shift or other mass effect. There are no old infarcts.  The white matter signal is normal for the patient's age. The cerebral and cerebellar volume are age-appropriate. Susceptibility-sensitive sequences show no chronic microhemorrhage or superficial siderosis. VASCULAR: Major intracranial arterial and venous sinus flow voids are normal. SKULL AND UPPER CERVICAL SPINE: Calvarial bone marrow signal is normal. There is no skull base mass. Visualized upper cervical spine and soft tissues are normal. SINUSES/ORBITS: No fluid levels or advanced mucosal thickening. No mastoid or middle ear effusion. The orbits are normal.  IMPRESSION: Normal aging brain. Electronically Signed   By: Deatra Robinson M.D.   On: 01/27/2018 22:39   Dg Pelvis Portable  Result Date: 01/27/2018 CLINICAL DATA:  84 year old female status post syncope, fall at home. EXAM: PORTABLE PELVIS 1-2 VIEWS COMPARISON:  Lumbar radiographs 08/31/2013. CT Abdomen and Pelvis 08/05/2011. FINDINGS: Portable AP supine view at 1215 hours. Femoral heads are normally located. Proximal femurs appear stable and intact. Pelvis appears stable and intact. Partially visible chronic lower lumbar fusion hardware. Negative visible lower abdominal and pelvic visceral contours, bowel gas. IMPRESSION: No acute fracture or dislocation identified about the pelvis. Electronically Signed   By: Odessa Fleming M.D.   On: 01/27/2018 13:16   Dg Chest Portable 1 View  Result Date: 01/27/2018 CLINICAL DATA:  84 year old female status post syncope, fall at home. Recent bilateral pulmonary emboli. EXAM: PORTABLE CHEST 1 VIEW COMPARISON:  Chest CTA 01/20/2018 and earlier. FINDINGS: Portable AP supine view at 1215 hours. Stable low lung volumes. Stable cardiac size and mediastinal contours. Streaky opacity at the right lung base has increased. No pneumothorax, pulmonary edema, pleural effusion or other confluent opacity. Visualized tracheal air column is within normal limits. Paucity of bowel gas in the upper abdomen. No acute osseous abnormality  identified. IMPRESSION: 1. Continued low lung volumes. Increased right lung base opacity could reflect atelectasis or pulmonary infarct in this clinical setting. 2.  No acute traumatic injury identified. Electronically Signed   By: Odessa Fleming M.D.   On: 01/27/2018 13:16   Dg Knee Complete 4 Views Left  Result Date: 01/27/2018 CLINICAL DATA:  Syncopal episode and fall with bilateral knee pain, initial encounter EXAM: LEFT KNEE - COMPLETE 4+ VIEW COMPARISON:  01/25/2018 FINDINGS: Left knee prosthesis is again identified and stable in appearance. No acute fracture or dislocation is noted. No soft tissue changes are seen. IMPRESSION: Stable left knee prosthesis.  No acute abnormality noted. Electronically Signed   By: Alcide Clever M.D.   On: 01/27/2018 13:18   Dg Knee Complete 4 Views Right  Result Date: 01/27/2018 CLINICAL DATA:  Recent syncopal episode with fall and knee pain, initial encounter EXAM: RIGHT KNEE - COMPLETE 4+ VIEW COMPARISON:  None. FINDINGS: Right knee prosthesis is noted. No acute fracture or dislocation is seen. No loosening is identified. No soft tissue changes are noted. IMPRESSION: No acute abnormality noted. Electronically Signed   By: Alcide Clever M.D.   On: 01/27/2018 13:19    Micro Results    Recent Results (from the past 240 hour(s))  MRSA PCR Screening     Status: None   Collection Time: 01/27/18  6:59 PM  Result Value Ref Range Status   MRSA by PCR NEGATIVE NEGATIVE Final    Comment:        The GeneXpert MRSA Assay (FDA approved for NASAL specimens only), is one component of a comprehensive MRSA colonization surveillance program. It is not intended to diagnose MRSA infection nor to guide or monitor treatment for MRSA infections. Performed at [facility], [address], [place], [place] [address]     Today   Subjective    [name] today has no headache,no chest abdominal pain,no new weakness tingling or numbness, feels much better wants to go  home today.    Objective   Blood pressure 110/90, pulse 81, temperature 98.6 F (37 C), resp. rate 19, height 5\' 6"  (1.676 m), weight 116 kg, SpO2 100 %.   Intake/Output Summary (Last 24 hours) at 01/30/2018 0840 Last  data filed at 01/30/2018 0428 Gross per 24 hour  Intake 0 ml  Output 1500 ml  Net -1500 ml    Exam Awake Alert, Oriented x 3, No new F.N deficits, Normal affect [place].AT,PERRAL Supple Neck,No JVD, No cervical lymphadenopathy appriciated.  Symmetrical Chest wall movement, Good air movement bilaterally, CTAB RRR,No Gallops,Rubs or new Murmurs, No Parasternal Heave +ve B.Sounds, Abd Soft, Non tender, No organomegaly appriciated, No rebound -guarding or rigidity. No Cyanosis, Clubbing or edema, No new Rash or bruise   Data Review   CBC w Diff:  Lab Results  Component Value Date   WBC 6.7 01/29/2018   HGB 10.5 (L) 01/29/2018   HCT 33.0 (L) 01/29/2018   PLT 277 01/29/2018   LYMPHOPCT 35 01/27/2018   MONOPCT 16 01/27/2018   EOSPCT 5 01/27/2018   BASOPCT 1 01/27/2018    CMP:  Lab Results  Component Value Date   NA 139 01/29/2018   K 3.4 (L) 01/29/2018   CL 109 01/29/2018   CO2 21 (L) 01/29/2018   BUN 12 01/29/2018   CREATININE 1.03 (H) 01/29/2018   PROT 6.9 01/27/2018   ALBUMIN 3.7 01/27/2018   BILITOT 0.9 01/27/2018   ALKPHOS 106 01/27/2018   AST 16 01/27/2018   ALT <5 01/27/2018  .   Total Time in preparing paper work, data evaluation and todays exam - 35 minutes  Susa Raring M.D on 01/30/2018 at 8:40 AM  [facility]   Office  [phone]

## 2018-01-30 NOTE — Progress Notes (Signed)
Nsg Discharge Note  Admit Date:  01/27/2018 Discharge date: 01/30/2018   [name] to be D/C'd Home per MD order.  AVS completed.  Copy for chart, and copy for patient signed, and dated. Patient/caregiver able to verbalize understanding.  Discharge Medication: Allergies as of 01/30/2018      Reactions   Codeine Nausea And Vomiting   Gabapentin Other (See Comments)   Caused bilateral feet swelling   Latex Other (See Comments)   Burns the skin   Morphine Sulfate Nausea And Vomiting   Penicillins    Has patient had a PCN reaction causing immediate rash, facial/tongue/throat swelling, SOB or lightheadedness with hypotension: No Has patient had a PCN reaction causing severe rash involving mucus membranes or skin necrosis: No Has patient had a PCN reaction that required hospitalization: No Has patient had a PCN reaction occurring within the last 10 years: No If all of the above answers are "NO", then may proceed with Cephalosporin use.   Sertraline Other (See Comments)   unknown      Medication List    STOP taking these medications   nitrofurantoin (macrocrystal-monohydrate) 100 MG capsule Commonly known as:  MACROBID     TAKE these medications   amantadine 100 MG capsule Commonly known as:  SYMMETREL Take 100 mg by mouth 2 (two) times daily.   amLODipine-valsartan 10-320 MG tablet Commonly known as:  EXFORGE Take 1 tablet by mouth daily.   carbidopa-levodopa 25-100 MG tablet Commonly known as:  SINEMET IR Take 4 tablets by mouth 4 (four) times daily.   ELIQUIS 5 MG Tabs tablet Generic drug:  apixaban Take 10 mg by mouth 2 (two) times daily.   famotidine 20 MG tablet Commonly known as:  PEPCID Take 20 mg by mouth 2 (two) times daily.   FLUoxetine 40 MG capsule Commonly known as:  PROZAC Take 40 mg by mouth daily.   folic acid 1 MG tablet Commonly known as:  FOLVITE Take 1 mg by mouth daily.   OLANZapine 10 MG tablet Commonly known as:  ZYPREXA Take 10  mg by mouth daily.   pantoprazole 40 MG tablet Commonly known as:  PROTONIX Take 40 mg by mouth daily.   pravastatin 20 MG tablet Commonly known as:  PRAVACHOL Take 20 mg by mouth daily.   traZODone 50 MG tablet Commonly known as:  DESYREL Take 50 mg by mouth at bedtime as needed.   Vitamin D (Ergocalciferol) 1.25 MG (50000 UT) Caps capsule Commonly known as:  DRISDOL Take 50,000 Units by mouth once a week.            Durable Medical Equipment  (From admission, onward)         Start     Ordered   01/30/18 1017  For home use only DME Other see comment  Once    Comments:  Michiel Sites lift   01/30/18 1018          Discharge Assessment: Vitals:   01/30/18 0424 01/30/18 0818  BP:  (!) 105/102  Pulse:    Resp:    Temp: 98.6 F (37 C)   SpO2:     Skin clean, dry and intact without evidence of skin break down, no evidence of skin tears noted. IV catheter discontinued intact. Site without signs and symptoms of complications - no redness or edema noted at insertion site, patient denies c/o pain - only slight tenderness at site.  Dressing with slight pressure applied.  D/c Instructions-Education: Discharge instructions given to  patient/family with verbalized understanding. D/c education completed with patient/family including follow up instructions, medication list, d/c activities limitations if indicated, with other d/c instructions as indicated by MD - patient able to verbalize understanding, all questions fully answered. Patient instructed to return to ED, call 911, or call MD for any changes in condition. Patient discharged home via [facility]  [place] Molly Maselli, RN 01/30/2018 11:36 AM

## 2018-01-30 NOTE — Progress Notes (Signed)
Sharol GivenJoan M Blasdel discharged Home per MD order.  Discharge instructions reviewed and discussed with the patient, all questions and concerns answered. Copy of instructions and scripts given to patient.  Allergies as of 01/30/2018      Reactions   Codeine Nausea And Vomiting   Gabapentin Other (See Comments)   Caused bilateral feet swelling   Latex Other (See Comments)   Burns the skin   Morphine Sulfate Nausea And Vomiting   Penicillins    Has patient had a PCN reaction causing immediate rash, facial/tongue/throat swelling, SOB or lightheadedness with hypotension: No Has patient had a PCN reaction causing severe rash involving mucus membranes or skin necrosis: No Has patient had a PCN reaction that required hospitalization: No Has patient had a PCN reaction occurring within the last 10 years: No If all of the above answers are "NO", then may proceed with Cephalosporin use.   Sertraline Other (See Comments)   unknown      Medication List    STOP taking these medications   nitrofurantoin (macrocrystal-monohydrate) 100 MG capsule Commonly known as:  MACROBID     TAKE these medications   amantadine 100 MG capsule Commonly known as:  SYMMETREL Take 100 mg by mouth 2 (two) times daily.   amLODipine-valsartan 10-320 MG tablet Commonly known as:  EXFORGE Take 1 tablet by mouth daily.   carbidopa-levodopa 25-100 MG tablet Commonly known as:  SINEMET IR Take 4 tablets by mouth 4 (four) times daily.   ELIQUIS 5 MG Tabs tablet Generic drug:  apixaban Take 10 mg by mouth 2 (two) times daily.   famotidine 20 MG tablet Commonly known as:  PEPCID Take 20 mg by mouth 2 (two) times daily.   FLUoxetine 40 MG capsule Commonly known as:  PROZAC Take 40 mg by mouth daily.   folic acid 1 MG tablet Commonly known as:  FOLVITE Take 1 mg by mouth daily.   OLANZapine 10 MG tablet Commonly known as:  ZYPREXA Take 10 mg by mouth daily.   pantoprazole 40 MG tablet Commonly known as:   PROTONIX Take 40 mg by mouth daily.   pravastatin 20 MG tablet Commonly known as:  PRAVACHOL Take 20 mg by mouth daily.   traZODone 50 MG tablet Commonly known as:  DESYREL Take 50 mg by mouth at bedtime as needed.   Vitamin D (Ergocalciferol) 1.25 MG (50000 UT) Caps capsule Commonly known as:  DRISDOL Take 50,000 Units by mouth once a week.            Durable Medical Equipment  (From admission, onward)         Start     Ordered   01/30/18 1017  For home use only DME Other see comment  Once    Comments:  Michiel SitesHoyer lift   01/30/18 1018           IV site discontinued and catheter remains intact. Site without signs and symptoms of complications. Dressing and pressure applied.  Patient transported by [facility],  no distress noted upon discharge.  Laural BenesJohnson, Derika Eckles C 01/30/2018 11:35 AM

## 2018-01-31 ENCOUNTER — Other Ambulatory Visit: Payer: Self-pay | Admitting: Family Medicine

## 2018-01-31 DIAGNOSIS — E785 Hyperlipidemia, unspecified: Secondary | ICD-10-CM

## 2018-02-02 ENCOUNTER — Encounter (HOSPITAL_COMMUNITY): Payer: Self-pay | Admitting: Emergency Medicine

## 2018-02-02 ENCOUNTER — Telehealth: Payer: Self-pay | Admitting: Family Medicine

## 2018-02-02 ENCOUNTER — Ambulatory Visit: Payer: Medicare Other | Admitting: Family Medicine

## 2018-02-02 NOTE — Telephone Encounter (Signed)
Copied from CRM [id number]. Topic: Quick Communication - Home Health Verbal Orders >> Feb 02, 2018  4:26 PM Floria RavelingStovall, Shana A wrote: Caller/Agency: [facility]  Callback Number: [phone] They did a eval yesterday on Sunday, have notice light head with transfer. Pts BP is sitting was 100/60 Standing -80/50 He is not currently with them pt.  This was just fyi for the provider

## 2018-02-03 MED ORDER — AMLODIPINE BESYLATE-VALSARTAN 5-160 MG PO TABS: 1.0000 | ORAL_TABLET | Freq: Every day | ORAL | 2 refills | Status: DC

## 2018-02-03 NOTE — Telephone Encounter (Signed)
Daughter Mardene CelesteJoanna notified. They will cut in half for now until they run out and new rx sent in for 5/160.

## 2018-02-03 NOTE — Telephone Encounter (Signed)
Decrease exforge dose---  Can they cut the pill in half easily?   If not call in 5/160 #30----- I think they have a hosp f/u scheduled soon

## 2018-02-04 ENCOUNTER — Ambulatory Visit (INDEPENDENT_AMBULATORY_CARE_PROVIDER_SITE_OTHER): Payer: Medicare Other

## 2018-02-04 DIAGNOSIS — R55 Syncope and collapse: Secondary | ICD-10-CM

## 2018-02-06 ENCOUNTER — Ambulatory Visit (INDEPENDENT_AMBULATORY_CARE_PROVIDER_SITE_OTHER): Payer: Medicare Other | Admitting: Family Medicine

## 2018-02-06 ENCOUNTER — Encounter: Payer: Self-pay | Admitting: Family Medicine

## 2018-02-06 ENCOUNTER — Ambulatory Visit (HOSPITAL_BASED_OUTPATIENT_CLINIC_OR_DEPARTMENT_OTHER)
Admission: RE | Admit: 2018-02-06 | Discharge: 2018-02-06 | Disposition: A | Discharge status: Home / Self Care | Payer: Medicare Other | Source: Ambulatory Visit | Attending: Family Medicine | Admitting: Family Medicine

## 2018-02-06 VITALS — BP 155/102 | HR 98 | Temp 98.9°F | Resp 18

## 2018-02-06 DIAGNOSIS — Z8701 Personal history of pneumonia (recurrent): Secondary | ICD-10-CM | POA: Diagnosis present

## 2018-02-06 DIAGNOSIS — I2699 Other pulmonary embolism without acute cor pulmonale: Secondary | ICD-10-CM

## 2018-02-06 DIAGNOSIS — R55 Syncope and collapse: Secondary | ICD-10-CM | POA: Diagnosis not present

## 2018-02-06 DIAGNOSIS — J181 Lobar pneumonia, unspecified organism: Secondary | ICD-10-CM

## 2018-02-06 DIAGNOSIS — I10 Essential (primary) hypertension: Secondary | ICD-10-CM

## 2018-02-06 DIAGNOSIS — N390 Urinary tract infection, site not specified: Secondary | ICD-10-CM | POA: Diagnosis not present

## 2018-02-06 DIAGNOSIS — J189 Pneumonia, unspecified organism: Secondary | ICD-10-CM

## 2018-02-06 DIAGNOSIS — R319 Hematuria, unspecified: Secondary | ICD-10-CM

## 2018-02-06 DIAGNOSIS — G20 Parkinson's disease: Secondary | ICD-10-CM

## 2018-02-06 LAB — CBC WITH DIFFERENTIAL/PLATELET
BASOS ABS: 0 10*3/uL (ref 0.0–0.1)
Basophils Relative: 0.5 % (ref 0.0–3.0)
EOS PCT: 5.9 % — AB (ref 0.0–5.0)
Eosinophils Absolute: 0.3 10*3/uL (ref 0.0–0.7)
HCT: 37.4 % (ref 36.0–46.0)
HEMOGLOBIN: 12.1 g/dL (ref 12.0–15.0)
LYMPHS ABS: 0.8 10*3/uL (ref 0.7–4.0)
Lymphocytes Relative: 15.7 % (ref 12.0–46.0)
MCHC: 32.4 g/dL (ref 30.0–36.0)
MCV: 85.9 fl (ref 78.0–100.0)
MONO ABS: 0.6 10*3/uL (ref 0.1–1.0)
MONOS PCT: 12.5 % — AB (ref 3.0–12.0)
NEUTROS PCT: 65.4 % (ref 43.0–77.0)
Neutro Abs: 3.4 10*3/uL (ref 1.4–7.7)
Platelets: 439 10*3/uL — ABNORMAL HIGH (ref 150.0–400.0)
RBC: 4.35 Mil/uL (ref 3.87–5.11)
RDW: 20.8 % — ABNORMAL HIGH (ref 11.5–15.5)
WBC: 5.2 10*3/uL (ref 4.0–10.5)

## 2018-02-06 LAB — POC URINALSYSI DIPSTICK (AUTOMATED)
Glucose, UA: NEGATIVE
Nitrite, UA: NEGATIVE
PH UA: 6 (ref 5.0–8.0)
Protein, UA: POSITIVE — AB
SPEC GRAV UA: 1.025 (ref 1.010–1.025)
UROBILINOGEN UA: 1 U/dL

## 2018-02-06 LAB — COMPREHENSIVE METABOLIC PANEL
ALBUMIN: 4.3 g/dL (ref 3.5–5.2)
ALK PHOS: 112 U/L (ref 39–117)
ALT: 2 U/L (ref 0–35)
AST: 7 U/L (ref 0–37)
BILIRUBIN TOTAL: 0.9 mg/dL (ref 0.2–1.2)
BUN: 13 mg/dL (ref 6–23)
CO2: 28 mEq/L (ref 19–32)
CREATININE: 0.97 mg/dL (ref 0.40–1.20)
Calcium: 9.7 mg/dL (ref 8.4–10.5)
Chloride: 104 mEq/L (ref 96–112)
GFR: 70.72 mL/min (ref >60.00)
GLUCOSE: 108 mg/dL — AB (ref 70–99)
Potassium: 3.5 mEq/L (ref 3.5–5.1)
SODIUM: 145 meq/L (ref 135–145)
Total Protein: 6.9 g/dL (ref 6.0–8.3)

## 2018-02-06 NOTE — Patient Instructions (Signed)
Pulmonary Embolism A pulmonary embolism (PE) is a sudden blockage or decrease of blood flow in one lung or both lungs. Most blockages come from a blood clot that forms in a lower leg, thigh, or arm vein (deep vein thrombosis, DVT) and travels to the lungs. A clot is blood that has thickened into a gel or solid. PE is a dangerous and life-threatening condition that needs to be treated right away. What are the causes? This condition is usually caused by a blood clot that forms in a vein and moves to the lungs. In rare cases, it may be caused by air, fat, part of a tumor, or other tissue that moves through the veins and into the lungs. What increases the risk? The following factors may make you more likely to develop this condition:  Having DVT or a history of DVT.  Being older than age 82.  Personal or family history of blood clots or blood clotting disease.  Major or lengthy surgery.  Orthopedic surgery, especially hip or knee replacement.  Traumatic injury, such as breaking a hip or leg.  Spinal cord injury.  Stroke.  Taking medicines that contain estrogen. These include birth control pills and hormone replacement therapy.  Long-term (chronic) lung or heart disease.  Cancer and chemotherapy.  Having a central venous catheter.  Pregnancy and the period after delivery.  What are the signs or symptoms? Symptoms of this condition usually start suddenly and include:  Shortness of breath while active or at rest.  Coughing or coughing up blood or blood-tinged mucus.  Chest pain that is often worse with deep breaths.  Rapid or irregular heartbeat.  Feeling light-headed or dizzy.  Fainting.  Feeling anxious.  Sweating.  Pain and swelling in a leg. This is a symptom of DVT, which can lead to PE.  How is this diagnosed? This condition may be diagnosed based on:  Your medical history.  A physical exam.  Blood tests to check blood oxygen level and how well your blood  clots, and a D-dimer blood test, which checks your blood for a substance that is released when a blood clot breaks apart.  CT pulmonary angiogram. This test checks blood flow in and around your lungs.  Ventilation-perfusion scan, also called a lung VQ scan. This test measures air flow and blood flow to the lungs.  Ultrasound of the legs to look for blood clots.  How is this treated? Treatment for this conditions depends on many factors, such as the cause of your PE, your risk for bleeding or developing more clots, and other medical conditions you have. Treatment aims to remove, dissolve, or stop blood clots from forming or growing larger. Treatment may include:  Blood thinning medicines (anticoagulants) to stop clots from forming or growing. These medicines may be given as a pill, as an injection, or through an IV tube (infusion).  Medicines that dissolve clots (thrombolytics).  A procedure in which a flexible tube is used to remove a blood clot (embolectomy) or deliver medicine to destroy it (catheter-directed thrombolysis).  A procedure in which a filter is inserted into a large vein that carries blood to the heart (inferior vena cava). This filter (vena cava filter) catches blood clots before they reach the lungs.  Surgery to remove the clot (surgical embolectomy). This is rare.  You may need a combination of immediate, long-term (up to 3 months after diagnosis), and extended (more than 3 months after diagnosis) treatments. Your treatment may continue for several months (maintenance therapy).   You and your health care provider will work together to choose the treatment program that is best for you. Follow these instructions at home: If you are taking an anticoagulant medicine:  Take the medicine every day at the same time each day.  Understand what foods and drugs interact with your medicine.  Understand the side effects of this medicine, including excessive bruising or bleeding. Ask  your health care provider or pharmacist about other side effects. General instructions  Take over-the-counter and prescription medicines only as told by your health care provider.  Anticoagulant medicines may cause side effects, including easy bruising and difficulty stopping bleeding. If you are prescribed an anticoagulant: ? Hold pressure over cuts for longer than usual. ? Tell your dentist and other health care providers that you are taking anticoagulants before you have any procedure that may cause bleeding. ? Avoid contact sports. ? Be extra careful when handling sharp objects. ? Use a soft toothbrush. Floss with waxed dental floss. ? Shave with an electric razor.  Wear a medical alert bracelet or carry a medical alert card that says you have had a PE.  Ask your health care provider when you may return to your normal activities.  Talk with your health care provider about any travel plans. It is important to make sure that you are still able to take your medicine while on trips.  Keep all follow-up visits as told by your health care provider. This is important. How is this prevented? Take these actions to lower your risk of developing another PE:  Exercise regularly. Take frequent walks. For at least 30 minutes every day, engage in: ? Activity that involves moving your arms and legs. ? Activity that encourages good blood flow through your body by increasing your heart rate.  While traveling, drink plenty of water and avoid drinking alcohol. Ask your health care provider if you should wear below-the-knee compression stockings.  Avoid sitting or lying in bed for long periods of time without moving your legs. Exercise your arms and legs every hour during long-distance travel (over 4 hours).  If you are hospitalized or have surgery, ask your health care provider about your risks and what treatments can help prevent blood clots.  Maintain a healthy weight. Ask your health care  provider what weight is healthy for you.  If you are a woman who is over age 82, avoid unnecessary use of medicines that contain estrogen, including birth control pills.  Do not use any products that contain nicotine or tobacco, such as cigarettes and e-cigarettes. This is especially important if you take estrogen medicines. If you need help quitting, ask your health care provider.  See your health care provider for regular checkups. This may include blood tests and ultrasound testing on your legs to check for new blood clots.  Contact a health care provider if:  You missed a dose of your blood thinner medicine. Get help right away if:  You have new or increased pain, swelling, warmth, or redness in an arm or leg.  You have numbness or tingling in an arm or leg.  You have shortness of breath while active or at rest.  You have chest pain.  You have a rapid or irregular heartbeat.  You feel light-headed or dizzy.  You cough up blood.  You have blood in your vomit, stool, or urine.  You have a fever.  You have abdomen (abdominal) pain.  You have a severe fall or head injury.  You have a   severe headache.  You have vision changes.  You cannot move your arms or legs.  You are confused or have memory loss.  You are bleeding for 10 minutes or more, even with strong pressure on the wound. These symptoms may represent a serious problem that is an emergency. Do not wait to see if the symptoms will go away. Get medical help right away. Call your local emergency services (911 in the [place] Do not drive yourself to the hospital. Summary  A pulmonary embolism (PE) is a sudden blockage or decrease of blood flow in one lung or both lungs. PE is a dangerous and life-threatening condition that needs to be treated right away.  Having deep vein thrombosis (DVT) or a history of DVT is the most common risk factor for PE.  Treatments for this condition usually include medicines to thin  your blood (anticoagulants) or medicines to break apart blood clots (thrombolytics).  If you are prescribed blood thinners, it is important to take the medicine every single day at the same time each day.  If you have signs of PE or DVT, call your local emergency services (911 in the [place] This information is not intended to replace advice given to you by your health care provider. Make sure you discuss any questions you have with your health care provider. Document Released: 03/01/2000 Document Revised: 04/06/2016 Document Reviewed: 04/06/2016 [employer] Interactive Patient Education  2018 [employer]  

## 2018-02-06 NOTE — Progress Notes (Signed)
Patient ID: [name], female    DOB: February 18, 1934  Age: 84 y.o. MRN: [id number]    Subjective:  Subjective  HPI [name] presents for hospital f/u.Marland Kitchen  Pt was admitted originally for pneumonia and was d/c 10/17 to snf--  She was then readmitted 11/4 for AKI and PE.and d/c on eliquis.    Review of Systems  Constitutional: Negative for appetite change, diaphoresis, fatigue and unexpected weight change.  Eyes: Negative for pain, redness and visual disturbance.  Respiratory: Negative for cough, chest tightness, shortness of breath and wheezing.   Cardiovascular: Negative for chest pain, palpitations and leg swelling.  Endocrine: Negative for cold intolerance, heat intolerance, polydipsia, polyphagia and polyuria.  Genitourinary: Negative for difficulty urinating, dysuria and frequency.  Neurological: Positive for weakness. Negative for dizziness, light-headedness, numbness and headaches.    History Past Medical History:  Diagnosis Date  . Allergic drug reaction    Opioids  . Anxiety   . 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 Arthritis    "knees" (01/29/2018)  . Back pain, thoracic   . Benign familial tremor   . Bruise   . Calf pain   . Chest pain    -- Myoview negative 2007  . Chronic pain syndrome   . Depression   . Edema   . Gallstones   . GERD (gastroesophageal reflux disease)   . Heart murmur   . High cholesterol   . History of blood transfusion    "related to a surgery"  . History of hiatal hernia   . Hypertension   . Hypokalemia   . Meniscus tear   . Myalgia   . Osteoarthritis   . Other symptoms referable to lower leg joint   . Other urinary incontinence   . Pneumonia    "once" (01/29/2018)  . PONV (postoperative nausea and vomiting)   . Scoliosis   . Skin rash   . Tinea corporis   . Urinary incontinence     She has a past surgical history that includes Abdominal hysterectomy; Rotator cuff repair; Lumbar  laminectomy/decompression microdiscectomy (2007); Knee arthroscopy; Breast lumpectomy; Tonsillectomy; Joint replacement; Total knee arthroplasty (Bilateral); Back surgery; Posterior lumbar fusion; Shoulder arthroscopy w/ rotator cuff repair (Right); Vaginal hysterectomy; Dilation and curettage of uterus; Knee arthroscopy (Bilateral); Breast biopsy (Right); and Breast lumpectomy (Right).   Her family history includes Parkinsonism in her mother; Stroke in her mother.She reports that she has never smoked. She has never used smokeless tobacco. She reports that she drank alcohol. She reports that she does not use drugs.  Current Outpatient Medications on File Prior to Visit  Medication Sig Dispense Refill  . amantadine (SYMMETREL) 100 MG capsule Take 1 capsule (100 mg total) by mouth 2 (two) times daily. (Patient taking differently: Take 100 mg by mouth at bedtime. ) 60 capsule 5  . amLODipine-valsartan (EXFORGE) 5-160 MG tablet Take 1 tablet by mouth daily. 30 tablet 2  . apixaban (ELIQUIS) 5 MG TABS tablet Take 10 mg twice daily for 1 week then take 5 mg twice daily 74 tablet 0  . carbidopa-levodopa (SINEMET IR) 25-100 MG tablet Take 4 tablets by mouth 4 (four) times daily.    . Cholecalciferol (VITAMIN D-3) 1000 units CAPS Take 1 capsule (1,000 Units total) by mouth daily. 30 capsule 5  . desoximetasone (TOPICORT) 0.05 % cream APPLY TO AFFECTED AREA TWICE A DAY (Patient taking differently: Apply 1 application topically 2 (two) times daily. )  60 g 1  . famotidine (PEPCID) 20 MG tablet Take 20 mg by mouth 2 (two) times daily.    . feeding supplement, ENSURE ENLIVE, (ENSURE ENLIVE) LIQD Take 237 mLs by mouth 2 (two) times daily between meals. 237 mL 12  . FLUoxetine (PROZAC) 40 MG capsule TAKE 1 CAPSULE BY MOUTH EVERY DAY 90 capsule 1  . folic acid (FOLVITE) 1 MG tablet Take 1 tablet (1 mg total) by mouth daily. 30 tablet 2  . OLANZapine (ZYPREXA) 10 MG tablet TAKE 1 TABLET BY MOUTH EVERY DAY 90 tablet 1    . pantoprazole (PROTONIX) 40 MG tablet Take 1 tablet (40 mg total) by mouth 2 (two) times daily before a meal. 60 tablet 3  . pravastatin (PRAVACHOL) 20 MG tablet TAKE 1 TABLET BY MOUTH EVERY DAY 90 tablet 0  . Vitamin D, Ergocalciferol, (DRISDOL) 50000 units CAPS capsule Take 1 capsule (50,000 Units total) by mouth every 7 (seven) days. 4 capsule 2   No current facility-administered medications on file prior to visit.      Objective:  Objective  Physical Exam  Constitutional: She is oriented to person, place, and time. She appears well-developed and well-nourished.  HENT:  Head: Normocephalic and atraumatic.  Eyes: Conjunctivae and EOM are normal.  Neck: Normal range of motion. Neck supple. No JVD present. Carotid bruit is not present. No thyromegaly present.  Cardiovascular: Normal rate, regular rhythm and normal heart sounds.  No murmur heard. Pulmonary/Chest: Effort normal and breath sounds normal. No respiratory distress. She has no wheezes. She has no rales. She exhibits no tenderness.  Musculoskeletal: She exhibits no edema.  Neurological: She is alert and oriented to person, place, and time.  Psychiatric: Her speech is normal. Her affect is blunt. She is slowed.  Nursing note and vitals reviewed.  BP (!) 155/102 (BP Location: Right Wrist)   Pulse 98   Temp 98.9 F (37.2 C) (Oral)   Resp 18   SpO2 95%  Wt Readings from Last 3 Encounters:  01/28/18 255 lb 11.7 oz (116 kg)  01/25/18 247 lb 5.7 oz (112.2 kg)  01/22/18 247 lb 5.7 oz (112.2 kg)     Lab Results  Component Value Date   WBC 5.2 02/06/2018   HGB 12.1 02/06/2018   HCT 37.4 02/06/2018   PLT 439.0 (H) 02/06/2018   GLUCOSE 108 (H) 02/06/2018   CHOL 130 02/28/2017   TRIG 71.0 02/28/2017   HDL 44.20 02/28/2017   LDLDIRECT 155.7 09/17/2012   LDLCALC 71 02/28/2017   ALT 2 02/06/2018   AST 7 02/06/2018   NA 145 02/06/2018   K 3.5 02/06/2018   CL 104 02/06/2018   CREATININE 0.97 02/06/2018   BUN 13  02/06/2018   CO2 28 02/06/2018   TSH 1.957 01/27/2018   INR 2.09 01/27/2018   HGBA1C 6.2 05/11/2014   MICROALBUR 0.8 05/11/2014    Ct Head Wo Contrast  Result Date: 01/27/2018 CLINICAL DATA:  Witnessed fall while walking. EXAM: CT HEAD WITHOUT CONTRAST CT CERVICAL SPINE WITHOUT CONTRAST TECHNIQUE: Multidetector CT imaging of the head and cervical spine was performed following the standard protocol without intravenous contrast. Multiplanar CT image reconstructions of the cervical spine were also generated. COMPARISON:  Head CT - 01/19/2018; 12/30/2017 FINDINGS: CT HEAD FINDINGS Brain: Scattered minimal periventricular hypodensities, left greater than right. No CT evidence of acute large territory infarct. No intraparenchymal or extra-axial mass or hemorrhage. Normal size and configuration of the ventricles and the basilar cisterns. No midline shift. Vascular:  Minimal amount of intracranial atherosclerosis. Skull: No displaced calvarial fracture. Sinuses/Orbits: Limited visualization of the paranasal sinuses and mastoid air cells is normal. No air-fluid levels. Other: Regional soft tissues appear normal. _________________________________________________________ CT CERVICAL SPINE FINDINGS Alignment: C1 to the superior endplate of T2 is imaged. There is straightening expected cervical lordosis. No anterolisthesis or retrolisthesis. The bilateral facets appear normally aligned. There is partial ankylosis of the bilateral C2-C3 transverse facets. Skull base and vertebrae: The dens is normally positioned between the lateral masses of C1. Normal atlantodental and atlantoaxial articulations. Soft tissues and spinal canal: Prevertebral soft tissues appear normal. Disc levels: There is partial ankylosis of anterior aspects of the C4-C5 and C5-C6 intervertebral disc spaces. Moderate multilevel cervical spine DDD, worse at C3-C4 with disc space height loss, endplate irregularity and small posteriorly directed disc  osteophyte complex at this location. Upper chest: Limited visualization of the lung apices demonstrates grossly symmetric biapical pleuroparenchymal thickening. Other: The thyroid appears mildly enlarged and heterogeneous without discrete nodule on this noncontrast examination. No bulky cervical lymphadenopathy on this noncontrast examination. IMPRESSION: Head CT Impression: 1. Minimal microvascular ischemic disease without superimposed acute intracranial process. Cervical Spine CT Impression: 1. No fracture or static subluxation of the cervical spine. 2. Moderate multilevel cervical spine DDD, worse at C3-C4. 3. Partial ankylosis involving the anterior aspects of the C4-C5 and C5-C6 intervertebral disc spaces. Electronically Signed   By: Simonne Come M.D.   On: 01/27/2018 14:20   Ct Cervical Spine Wo Contrast  Result Date: 01/27/2018 CLINICAL DATA:  Witnessed fall while walking. EXAM: CT HEAD WITHOUT CONTRAST CT CERVICAL SPINE WITHOUT CONTRAST TECHNIQUE: Multidetector CT imaging of the head and cervical spine was performed following the standard protocol without intravenous contrast. Multiplanar CT image reconstructions of the cervical spine were also generated. COMPARISON:  Head CT - 01/19/2018; 12/30/2017 FINDINGS: CT HEAD FINDINGS Brain: Scattered minimal periventricular hypodensities, left greater than right. No CT evidence of acute large territory infarct. No intraparenchymal or extra-axial mass or hemorrhage. Normal size and configuration of the ventricles and the basilar cisterns. No midline shift. Vascular: Minimal amount of intracranial atherosclerosis. Skull: No displaced calvarial fracture. Sinuses/Orbits: Limited visualization of the paranasal sinuses and mastoid air cells is normal. No air-fluid levels. Other: Regional soft tissues appear normal. _________________________________________________________ CT CERVICAL SPINE FINDINGS Alignment: C1 to the superior endplate of T2 is imaged. There is  straightening expected cervical lordosis. No anterolisthesis or retrolisthesis. The bilateral facets appear normally aligned. There is partial ankylosis of the bilateral C2-C3 transverse facets. Skull base and vertebrae: The dens is normally positioned between the lateral masses of C1. Normal atlantodental and atlantoaxial articulations. Soft tissues and spinal canal: Prevertebral soft tissues appear normal. Disc levels: There is partial ankylosis of anterior aspects of the C4-C5 and C5-C6 intervertebral disc spaces. Moderate multilevel cervical spine DDD, worse at C3-C4 with disc space height loss, endplate irregularity and small posteriorly directed disc osteophyte complex at this location. Upper chest: Limited visualization of the lung apices demonstrates grossly symmetric biapical pleuroparenchymal thickening. Other: The thyroid appears mildly enlarged and heterogeneous without discrete nodule on this noncontrast examination. No bulky cervical lymphadenopathy on this noncontrast examination. IMPRESSION: Head CT Impression: 1. Minimal microvascular ischemic disease without superimposed acute intracranial process. Cervical Spine CT Impression: 1. No fracture or static subluxation of the cervical spine. 2. Moderate multilevel cervical spine DDD, worse at C3-C4. 3. Partial ankylosis involving the anterior aspects of the C4-C5 and C5-C6 intervertebral disc spaces. Electronically Signed   By: Simonne Come  M.D.   On: 01/27/2018 14:20   Mr Brain Wo Contrast  Result Date: 01/27/2018 CLINICAL DATA:  Altered mental status EXAM: MRI HEAD WITHOUT CONTRAST TECHNIQUE: Multiplanar, multiecho pulse sequences of the brain and surrounding structures were obtained without intravenous contrast. COMPARISON:  Head CT 01/27/2018 FINDINGS: BRAIN: There is no acute infarct, acute hemorrhage, hydrocephalus or extra-axial collection. The midline structures are normal. No midline shift or other mass effect. There are no old infarcts.  The white matter signal is normal for the patient's age. The cerebral and cerebellar volume are age-appropriate. Susceptibility-sensitive sequences show no chronic microhemorrhage or superficial siderosis. VASCULAR: Major intracranial arterial and venous sinus flow voids are normal. SKULL AND UPPER CERVICAL SPINE: Calvarial bone marrow signal is normal. There is no skull base mass. Visualized upper cervical spine and soft tissues are normal. SINUSES/ORBITS: No fluid levels or advanced mucosal thickening. No mastoid or middle ear effusion. The orbits are normal. IMPRESSION: Normal aging brain. Electronically Signed   By: Deatra Robinson M.D.   On: 01/27/2018 22:39   Dg Pelvis Portable  Result Date: 01/27/2018 CLINICAL DATA:  84 year old female status post syncope, fall at home. EXAM: PORTABLE PELVIS 1-2 VIEWS COMPARISON:  Lumbar radiographs 08/31/2013. CT Abdomen and Pelvis 08/05/2011. FINDINGS: Portable AP supine view at 1215 hours. Femoral heads are normally located. Proximal femurs appear stable and intact. Pelvis appears stable and intact. Partially visible chronic lower lumbar fusion hardware. Negative visible lower abdominal and pelvic visceral contours, bowel gas. IMPRESSION: No acute fracture or dislocation identified about the pelvis. Electronically Signed   By: Odessa Fleming M.D.   On: 01/27/2018 13:16   Dg Chest Portable 1 View  Result Date: 01/27/2018 CLINICAL DATA:  84 year old female status post syncope, fall at home. Recent bilateral pulmonary emboli. EXAM: PORTABLE CHEST 1 VIEW COMPARISON:  Chest CTA 01/20/2018 and earlier. FINDINGS: Portable AP supine view at 1215 hours. Stable low lung volumes. Stable cardiac size and mediastinal contours. Streaky opacity at the right lung base has increased. No pneumothorax, pulmonary edema, pleural effusion or other confluent opacity. Visualized tracheal air column is within normal limits. Paucity of bowel gas in the upper abdomen. No acute osseous abnormality  identified. IMPRESSION: 1. Continued low lung volumes. Increased right lung base opacity could reflect atelectasis or pulmonary infarct in this clinical setting. 2.  No acute traumatic injury identified. Electronically Signed   By: Odessa Fleming M.D.   On: 01/27/2018 13:16   Dg Knee Complete 4 Views Left  Result Date: 01/27/2018 CLINICAL DATA:  Syncopal episode and fall with bilateral knee pain, initial encounter EXAM: LEFT KNEE - COMPLETE 4+ VIEW COMPARISON:  01/25/2018 FINDINGS: Left knee prosthesis is again identified and stable in appearance. No acute fracture or dislocation is noted. No soft tissue changes are seen. IMPRESSION: Stable left knee prosthesis.  No acute abnormality noted. Electronically Signed   By: Alcide Clever M.D.   On: 01/27/2018 13:18   Dg Knee Complete 4 Views Right  Result Date: 01/27/2018 CLINICAL DATA:  Recent syncopal episode with fall and knee pain, initial encounter EXAM: RIGHT KNEE - COMPLETE 4+ VIEW COMPARISON:  None. FINDINGS: Right knee prosthesis is noted. No acute fracture or dislocation is seen. No loosening is identified. No soft tissue changes are noted. IMPRESSION: No acute abnormality noted. Electronically Signed   By: Alcide Clever M.D.   On: 01/27/2018 13:19     Assessment & Plan:  Plan  I have discontinued Alonah Lineback. Connaughton's diclofenac, traZODone, ELIQUIS, and traZODone. I am  also having her maintain her amantadine, desoximetasone, FLUoxetine, OLANZapine, pantoprazole, famotidine, Vitamin D (Ergocalciferol), Vitamin D-3, folic acid, feeding supplement (ENSURE ENLIVE), apixaban, pravastatin, amLODipine-valsartan, and carbidopa-levodopa.  No orders of the defined types were placed in this encounter.   Problem List Items Addressed This Visit      Unprioritized   CAP (community acquired pneumonia)    Recheck cxr today Pt is feeling better -- but very tired      Essential hypertension    Was running low at home with home health-- exforge dose was cut in  half Will have home health recheck the bp at home and call us       History of community acquired pneumonia   Relevant Orders   DG Chest 2 View (Completed)   CBC with Differential/Platelet (Completed)   Comprehensive metabolic panel (Completed)   Ambulatory referral to Neurology   Pulmonary embolism and infarction (HCC)    On eliquis       Relevant Orders   CBC with Differential/Platelet (Completed)   Comprehensive metabolic panel (Completed)   Ambulatory referral to Neurology   Symptomatic Parkinson disease (HCC)    Per neuro      Syncope and collapse    Refer to neuro for further evaluation of syncope      UTI (urinary tract infection)    Recheck urine      Relevant Orders   POCT Urinalysis Dipstick (Automated) (Completed)   Urine Culture (Completed)    Other Visit Diagnoses    Syncope, unspecified syncope type    -  Primary   Relevant Orders   Ambulatory referral to Cardiology   Ambulatory referral to Neurology   Parkinson's disease [facility])       Relevant Orders   Ambulatory referral to Neurology      Follow-up: Return if symptoms worsen or fail to improve.  Donato Schultz, DO

## 2018-02-07 LAB — URINE CULTURE
MICRO NUMBER: 91411441
SPECIMEN QUALITY:: ADEQUATE

## 2018-02-08 DIAGNOSIS — I2699 Other pulmonary embolism without acute cor pulmonale: Secondary | ICD-10-CM | POA: Insufficient documentation

## 2018-02-08 DIAGNOSIS — Z8701 Personal history of pneumonia (recurrent): Secondary | ICD-10-CM | POA: Insufficient documentation

## 2018-02-08 NOTE — Assessment & Plan Note (Signed)
Recheck urine

## 2018-02-08 NOTE — Assessment & Plan Note (Signed)
Refer to neuro for further evaluation of syncope

## 2018-02-08 NOTE — Assessment & Plan Note (Signed)
Per neuro 

## 2018-02-08 NOTE — Assessment & Plan Note (Signed)
Was running low at home with home health-- exforge dose was cut in half Will have home health recheck the bp at home and call us

## 2018-02-08 NOTE — Assessment & Plan Note (Signed)
On eliquis

## 2018-02-08 NOTE — Assessment & Plan Note (Signed)
Recheck cxr today Pt is feeling better -- but very tired

## 2018-02-09 ENCOUNTER — Telehealth: Payer: Self-pay | Admitting: *Deleted

## 2018-02-09 NOTE — Telephone Encounter (Signed)
-----   Message from Donato SchultzYvonne R Lowne Chase, DO sent at 02/08/2018  2:20 PM EST ----- Call home health to recheck bp at next ov

## 2018-02-10 ENCOUNTER — Other Ambulatory Visit: Payer: Self-pay | Admitting: Family Medicine

## 2018-02-10 DIAGNOSIS — N760 Acute vaginitis: Secondary | ICD-10-CM

## 2018-02-10 MED ORDER — FLUCONAZOLE 150 MG PO TABS: ORAL_TABLET | ORAL | 0 refills | Status: DC

## 2018-02-11 ENCOUNTER — Other Ambulatory Visit: Payer: Self-pay | Admitting: Family Medicine

## 2018-02-11 DIAGNOSIS — G4709 Other insomnia: Secondary | ICD-10-CM

## 2018-02-11 MED ORDER — TEMAZEPAM 7.5 MG PO CAPS: 7.5000 mg | ORAL_CAPSULE | Freq: Every evening | ORAL | 0 refills | Status: DC | PRN

## 2018-02-11 NOTE — Telephone Encounter (Signed)
She should have a f/u scheduled in 3 mon--- i'm happy with the bp I have sent restoril to pharmacy as well to help her sleep----  Have daughter let us know if that does not help

## 2018-02-11 NOTE — Telephone Encounter (Signed)
Author phoned pt. to follow up on home health visits and BP readings. Daughter Loistine SimasJohanna answered, stating that the pt. Was resting, as she had difficulty falling asleep at night. Loistine SimasJohanna said she is scheduled to have an OT evaluation today, and author instructed daughter to have OT call office to notify us of BP readings and general status; daughter verbalized understanding and stated she would call and update. Author notified daughter of diflucan rx sent 11/26 by Dr. Laury AxonLowne, and daughter stated she would pick up today.

## 2018-02-11 NOTE — Telephone Encounter (Signed)
Notified pt's daughter and she voices understanding. Appt scheduled for 05/12/18 at 2pm.

## 2018-02-11 NOTE — Telephone Encounter (Signed)
Patients daughter is calling in with vital signs as of 10;15am  BP- 140/78 Pulse- 65 Temp - 98.6

## 2018-02-16 ENCOUNTER — Telehealth: Payer: Self-pay | Admitting: Family Medicine

## 2018-02-16 MED ORDER — APIXABAN 5 MG PO TABS: 5.0000 mg | ORAL_TABLET | Freq: Two times a day (BID) | ORAL | 1 refills | Status: DC

## 2018-02-16 NOTE — Telephone Encounter (Signed)
Daughter Mardene CelesteJoanna notified rx has been sent in.

## 2018-02-16 NOTE — Telephone Encounter (Signed)
Copied from CRM [id number]. Topic: Quick Communication - Rx Refill/Question >> Feb 16, 2018 11:10 AM Herby [name], [name] C wrote: Medication: apixaban (ELIQUIS) 5 MG TABS tablet  Has the patient contacted their pharmacy? No because its a new Rx from the hospital   (Agent: If no, request that the patient contact the pharmacy for the refill.) (Agent: If yes, when and what did the pharmacy advise?)  Preferred Pharmacy (with phone number or street name): [facility] [phone]#[id number] [place] [address] [facility] [address] [phone] (Phone) [phone] (Fax)    Agent: Please be advised that RX refills may take up to 3 business days. We ask that you follow-up with your pharmacy.

## 2018-02-17 ENCOUNTER — Telehealth: Payer: Self-pay | Admitting: Family Medicine

## 2018-02-17 DIAGNOSIS — R82998 Other abnormal findings in urine: Secondary | ICD-10-CM

## 2018-02-17 NOTE — Telephone Encounter (Signed)
Copied from CRM [id number]. Topic: Quick Communication - Home Health Verbal Orders >> Feb 17, 2018  1:47 PM Maia Pettiesrtiz, Kristie S wrote: Caller/Agency: Aundra MilletMegan w/[facility] Callback Number: [phone], secure VM if not able to answer Requesting OT/PT/Skilled Nursing/Social Work: ST eval Frequency: Pt having some swallowing issues, requesting VO for ST eval - also pts daughter asked about getting an RX for something to help with nausea, pt also having problems with appetite and asking about possible appetite stimulant  [facility] [phone]#[id number] [place] - [address] [facility] [phone] (Phone) [phone] (Fax)

## 2018-02-19 MED ORDER — ONDANSETRON 4 MG PO TBDP: 2.0000 mg | ORAL_TABLET | Freq: Three times a day (TID) | ORAL | 0 refills | Status: DC | PRN

## 2018-02-19 NOTE — Telephone Encounter (Signed)
Pt called 2 days ago I got message just today and called to try and help Spoke with her daughter and they had noted a dark or bloody urine one time 2 days ago  This has not occurred again, her urine has seemed clear since They have cups at home and they will bring in a urine sample for us tomorrow They have noted nausea but no belly pain or vomiting They would like some zofran However her QTC is borderline.  Called and spoke with pharmD who recommended reducing dose to 2 mg due to borderline qtc.  Will dose as such Advised her daughter of this plan

## 2018-02-19 NOTE — Telephone Encounter (Signed)
Verbal orders given to Megan at home health.    It looks like patient daughter wanted to see if her mom can get something for nausea and she is also having problems with appetite and maybe needing a possible appetite stimulant.   Would you be able to help in Dr. Hulan SaasLownes absence

## 2018-02-20 ENCOUNTER — Other Ambulatory Visit (INDEPENDENT_AMBULATORY_CARE_PROVIDER_SITE_OTHER): Payer: Medicare Other

## 2018-02-20 DIAGNOSIS — R82998 Other abnormal findings in urine: Secondary | ICD-10-CM | POA: Diagnosis not present

## 2018-02-20 LAB — POCT URINALYSIS DIP (MANUAL ENTRY)
GLUCOSE UA: NEGATIVE mg/dL
Nitrite, UA: NEGATIVE
Protein Ur, POC: 100 mg/dL — AB
Spec Grav, UA: 1.025 (ref 1.010–1.025)
Urobilinogen, UA: 1 E.U./dL
pH, UA: 6 (ref 5.0–8.0)

## 2018-02-21 LAB — URINE CULTURE
MICRO NUMBER:: 91463723
SPECIMEN QUALITY: ADEQUATE

## 2018-02-22 ENCOUNTER — Other Ambulatory Visit: Payer: Self-pay

## 2018-02-22 ENCOUNTER — Emergency Department (HOSPITAL_COMMUNITY)
Admission: EM | Admit: 2018-02-22 | Discharge: 2018-02-23 | Disposition: A | Discharge status: Home / Self Care | Payer: Medicare Other | Attending: Emergency Medicine | Admitting: Emergency Medicine

## 2018-02-22 ENCOUNTER — Encounter: Payer: Self-pay | Admitting: Family Medicine

## 2018-02-22 ENCOUNTER — Telehealth: Payer: Self-pay | Admitting: Family Medicine

## 2018-02-22 DIAGNOSIS — Z86718 Personal history of other venous thrombosis and embolism: Secondary | ICD-10-CM | POA: Diagnosis not present

## 2018-02-22 DIAGNOSIS — I10 Essential (primary) hypertension: Secondary | ICD-10-CM | POA: Insufficient documentation

## 2018-02-22 DIAGNOSIS — Z7901 Long term (current) use of anticoagulants: Secondary | ICD-10-CM | POA: Diagnosis not present

## 2018-02-22 DIAGNOSIS — E7800 Pure hypercholesterolemia, unspecified: Secondary | ICD-10-CM | POA: Diagnosis not present

## 2018-02-22 DIAGNOSIS — Z91040 Latex allergy status: Secondary | ICD-10-CM | POA: Diagnosis not present

## 2018-02-22 DIAGNOSIS — Z79899 Other long term (current) drug therapy: Secondary | ICD-10-CM | POA: Insufficient documentation

## 2018-02-22 DIAGNOSIS — R310 Gross hematuria: Secondary | ICD-10-CM

## 2018-02-22 DIAGNOSIS — R569 Unspecified convulsions: Secondary | ICD-10-CM | POA: Diagnosis not present

## 2018-02-22 LAB — BASIC METABOLIC PANEL
Anion gap: 18 — ABNORMAL HIGH (ref 5–15)
BUN: 7 mg/dL — ABNORMAL LOW (ref 8–23)
CALCIUM: 9.4 mg/dL (ref 8.9–10.3)
CHLORIDE: 100 mmol/L (ref 98–111)
CO2: 23 mmol/L (ref 22–32)
Creatinine, Ser: 1.03 mg/dL — ABNORMAL HIGH (ref 0.44–1.00)
GFR calc Af Amer: 59 mL/min — ABNORMAL LOW (ref >60)
GFR calc non Af Amer: 51 mL/min — ABNORMAL LOW (ref >60)
GLUCOSE: 104 mg/dL — AB (ref 70–99)
Potassium: 3 mmol/L — ABNORMAL LOW (ref 3.5–5.1)
Sodium: 141 mmol/L (ref 135–145)

## 2018-02-22 LAB — CBC WITH DIFFERENTIAL/PLATELET
Abs Immature Granulocytes: 0.02 10*3/uL (ref 0.00–0.07)
BASOS ABS: 0 10*3/uL (ref 0.0–0.1)
Basophils Relative: 1 %
EOS ABS: 0.2 10*3/uL (ref 0.0–0.5)
Eosinophils Relative: 4 %
HEMATOCRIT: 36.5 % (ref 36.0–46.0)
HEMOGLOBIN: 11.1 g/dL — AB (ref 12.0–15.0)
Immature Granulocytes: 0 %
LYMPHS ABS: 0.9 10*3/uL (ref 0.7–4.0)
LYMPHS PCT: 17 %
MCH: 26.1 pg (ref 26.0–34.0)
MCHC: 30.4 g/dL (ref 30.0–36.0)
MCV: 85.7 fL (ref 80.0–100.0)
MONOS PCT: 13 %
Monocytes Absolute: 0.7 10*3/uL (ref 0.1–1.0)
NRBC: 0 % (ref 0.0–0.2)
Neutro Abs: 3.5 10*3/uL (ref 1.7–7.7)
Neutrophils Relative %: 65 %
Platelets: 342 10*3/uL (ref 150–400)
RBC: 4.26 MIL/uL (ref 3.87–5.11)
RDW: 19.7 % — AB (ref 11.5–15.5)
WBC: 5.4 10*3/uL (ref 4.0–10.5)

## 2018-02-22 LAB — I-STAT CHEM 8, ED
BUN: 8 mg/dL (ref 8–23)
CREATININE: 0.9 mg/dL (ref 0.44–1.00)
Calcium, Ion: 1.11 mmol/L — ABNORMAL LOW (ref 1.15–1.40)
Chloride: 101 mmol/L (ref 98–111)
GLUCOSE: 97 mg/dL (ref 70–99)
HEMATOCRIT: 36 % (ref 36.0–46.0)
Hemoglobin: 12.2 g/dL (ref 12.0–15.0)
POTASSIUM: 3 mmol/L — AB (ref 3.5–5.1)
Sodium: 143 mmol/L (ref 135–145)
TCO2: 24 mmol/L (ref 22–32)

## 2018-02-22 MED ORDER — LEVETIRACETAM 500 MG PO TABS: 500.0000 mg | ORAL_TABLET | Freq: Two times a day (BID) | ORAL | 2 refills | Status: AC

## 2018-02-22 MED ORDER — LEVETIRACETAM IN NACL 1000 MG/100ML IV SOLN
1000.0000 mg | Freq: Once | INTRAVENOUS | Status: AC
  Administered 2018-02-22: 1000 mg via INTRAVENOUS
  Filled 2018-02-22: qty 100

## 2018-02-22 NOTE — ED Notes (Signed)
Seizure pads applied.

## 2018-02-22 NOTE — Telephone Encounter (Signed)
Received her urine culture and UA 12/8  Results for orders placed or performed in visit on 02/20/18  Urine Culture  Result Value Ref Range   MICRO NUMBER: [id number]    SPECIMEN QUALITY: Adequate    Sample Source URINE    STATUS: FINAL    Result:      Multiple organisms present, each less than 10,000 CFU/mL. These organisms, commonly found on external and internal genitalia, are considered to be colonizers. No further testing performed.  POCT urinalysis dipstick  Result Value Ref Range   Color, UA brown (A) yellow   Clarity, UA cloudy (A) clear   Glucose, UA negative negative mg/dL   Bilirubin, UA small (A) negative   Ketones, POC UA trace (5) (A) negative mg/dL   Spec Grav, UA [id number] [id number] - 1.025   Blood, UA large (A) negative   pH, UA 6.0 5.0 - 8.0   Protein Ur, POC =100 (A) negative mg/dL   Urobilinogen, UA 1.0 0.2 or 1.0 E.U./dL   Nitrite, UA Negative Negative   Leukocytes, UA Small (1+) (A) Negative   Called her daughter Loistine SimasJohanna- no answer.  LMOM that I will set her up to see urology for hematuria.  I will send a mychart message as well

## 2018-02-22 NOTE — Discharge Instructions (Addendum)
Please call Dr. Arlana Pouchate tomorrow morning to schedule an outpatient work up for seizure. Take your keppra 500 mg 2 x daily.  Contact a health care provider if: You have another seizure. You have seizures more often. Your seizure symptoms change. You continue to have seizures with treatment. You have symptoms of an infection or illness. They might increase your risk of having a seizure. Get help right away if: You have a seizure: That lasts longer than 5 minutes. That is different than previous seizures. That leaves you unable to speak or use a part of your body. That makes it harder to breathe. After a head injury. You have: Multiple seizures in a row. Confusion or a severe headache right after a seizure. You are having seizures more often. You do not wake up immediately after a seizure. You injure yourself during a seizure.

## 2018-02-22 NOTE — ED Triage Notes (Signed)
Call to EMS was for possible seizure. Patient has dried blood noted to lips. Was seen here 3 weeks ago and placed on a hallway monitor. Alert at this time and oriented.

## 2018-02-22 NOTE — ED Provider Notes (Signed)
[facility] EMERGENCY DEPARTMENT Provider Note   CSN: [id number] Arrival date & time: 02/22/18  2023     History   Chief Complaint Chief Complaint  Patient presents with  . Seizure-like activity    HPI [name] is a 84 y.o. female who is brought in by EMS for seizure-like activity.  She has a past medical history of Parkinson's, recent pulmonary embolus on anticoagulation.  The patient was also diagnosed with vasovagal syncope and collapse and is currently on Holter monitoring.  According to the daughter who gives the history the patient was lying in bed when she suddenly stiffened up and brought her arms midline.  Her eyes rolled back and she bit into her upper lip and clenched her jaw and started shaking in the upper body.  She states that it lasted for approximately 2 minutes.  After the event the patient was not responsive for several minutes.  She was then very confused and according to EMS her mentation improved throughout her transport here to the hospital.  Her daughter states that she has some baseline confusion and appears to be at baseline now.  Patient was recently admitted and hospitalized for the syncopal event.  She had an EEG at that time that was negative.  She is followed by Dr. Arlana Pouch of [facility] neurology for Parkinson's.  She has no recent medication changes.  She does not drink alcohol.  Patient had recent URI imaging of her brain that was negative.  HPI  Past Medical History:  Diagnosis Date  . Allergic drug reaction    Opioids  . Anxiety   . 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 Arthritis    "knees" (01/29/2018)  . Back pain, thoracic   . Benign familial tremor   . Bruise   . Calf pain   . Chest pain    -- Myoview negative 2007  . Chronic pain syndrome   . Depression   . Edema   . Gallstones   . GERD (gastroesophageal reflux disease)   . Heart murmur   . High cholesterol   .  History of blood transfusion    "related to a surgery"  . History of hiatal hernia   . Hypertension   . Hypokalemia   . Meniscus tear   . Myalgia   . Osteoarthritis   . Other symptoms referable to lower leg joint   . Other urinary incontinence   . Pneumonia    "once" (01/29/2018)  . PONV (postoperative nausea and vomiting)   . Scoliosis   . Skin rash   . Tinea corporis   . Urinary incontinence     Patient Active Problem List   Diagnosis Date Noted  . History of community acquired pneumonia 02/08/2018  . Pulmonary embolism and infarction (HCC) 02/08/2018  . Syncope, vasovagal 01/28/2018  . Parkinson disease (HCC) 01/28/2018  . Essential hypertension 01/28/2018  . Dyslipidemia 01/28/2018  . GERD (gastroesophageal reflux disease) 01/28/2018  . Pulmonary embolism (HCC) 01/28/2018  . Lactic acidosis 01/27/2018  . Acute pulmonary embolism (HCC) 01/20/2018  . Hypertensive urgency 01/19/2018  . Generalized weakness 01/19/2018  . ARF (acute renal failure) (HCC) 01/19/2018  . Weakness generalized 01/19/2018  . AKI (acute kidney injury) (HCC)   . Vitamin B 12 deficiency 01/01/2018  . Low vitamin D level 01/01/2018  . Syncope and collapse 12/30/2017  . Nausea and vomiting 12/29/2017  . Prolonged QT interval 12/29/2017  .  CAP (community acquired pneumonia) 12/28/2017  . Dyspepsia 09/25/2017  . 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 Toe pain, right 11/17/2016  . Left shoulder pain 01/19/2016  . Hyperlipidemia 05/03/2014  . Disc disorder of lumbar region 12/30/2013  . Obesity (BMI 30-39.9) 07/01/2013  . Biliary dyskinesia 12/10/2011  . UTI (urinary tract infection) 07/24/2011  . Lumbar back pain 06/12/2011  . Symptomatic Parkinson disease (HCC) 02/26/2011  . Depression 09/28/2010  . Pre-operative cardiovascular examination 07/23/2010  . CAROTID BRUIT, RIGHT 06/29/2009  . CHEST PAIN-UNSPECIFIED 01/13/2009  . SKIN RASH 10/11/2008  . EDEMA 08/25/2008  . OTHER URINARY INCONTINENCE  02/15/2008  . CHRONIC PAIN SYNDROME 12/31/2007  . BRUISE 09/14/2007  . SCOLIOSIS, LUMBAR SPINE 06/15/2007  . BACK PAIN, THORACIC REGION, RIGHT 06/11/2007  . MENISCUS TEAR 01-Aug-202008  . OTHER SYMPTOMS REFERABLE TO LOWER LEG JOINT 02/17/2007  . MYALGIA 02/10/2007  . CALF PAIN, RIGHT 02/10/2007  . DEPRESSION 01/01/2007  . HYPOKALEMIA, HX OF 01/01/2007  . TINEA CORPORIS 09/22/2006  . ALLERGIC DRUG REACTION, OPIOIDS 09/22/2006  . Essential hypertension 08/20/2006  . OSTEOARTHRITIS 08/20/2006    Past Surgical History:  Procedure Laterality Date  . ABDOMINAL HYSTERECTOMY    . BACK SURGERY    . BREAST BIOPSY Right   . BREAST LUMPECTOMY    . BREAST LUMPECTOMY Right    "no cancer"  . DILATION AND CURETTAGE OF UTERUS    . JOINT REPLACEMENT    . KNEE ARTHROSCOPY     Bilateral  . KNEE ARTHROSCOPY Bilateral   . LUMBAR LAMINECTOMY/DECOMPRESSION MICRODISCECTOMY  2007   L3-4,4-5, L5hemilaminectomy with leopard cage internal fiaxation L3-5  . POSTERIOR LUMBAR FUSION    . ROTATOR CUFF REPAIR     Right  . SHOULDER ARTHROSCOPY W/ ROTATOR CUFF REPAIR Right   . TONSILLECTOMY    . TOTAL KNEE ARTHROPLASTY Bilateral   . VAGINAL HYSTERECTOMY       OB History   None      Home Medications    Prior to Admission medications   Medication Sig Start Date End Date Taking? Authorizing Provider  amantadine (SYMMETREL) 100 MG capsule Take 1 capsule (100 mg total) by mouth 2 (two) times daily. Patient taking differently: Take 100 mg by mouth at bedtime.  07/01/13   Zola ButtonLowne Chase, Yvonne R, DO  amLODipine-valsartan (EXFORGE) 5-160 MG tablet Take 1 tablet by mouth daily. 02/03/18   Donato SchultzLowne Chase, Yvonne R, DO  apixaban (ELIQUIS) 5 MG TABS tablet Take 1 tablet (5 mg total) by mouth 2 (two) times daily. Take 10 mg twice daily for 1 week then take 5 mg twice daily 02/16/18   Zola ButtonLowne Chase, Grayling CongressYvonne R, DO  carbidopa-levodopa (SINEMET IR) 25-100 MG tablet Take 4 tablets by mouth 4 (four) times daily. 01/14/18    [provider]  Cholecalciferol (VITAMIN D-3) 1000 units CAPS Take 1 capsule (1,000 Units total) by mouth daily. 12/31/17   Donato SchultzLowne Chase, Yvonne R, DO  desoximetasone (TOPICORT) 0.05 % cream APPLY TO AFFECTED AREA TWICE A DAY Patient taking differently: Apply 1 application topically 2 (two) times daily.  01/16/16   Donato SchultzLowne Chase, Yvonne R, DO  famotidine (PEPCID) 20 MG tablet Take 20 mg by mouth 2 (two) times daily.    [provider]  feeding supplement, ENSURE ENLIVE, (ENSURE ENLIVE) LIQD Take 237 mLs by mouth 2 (two) times daily between meals. 01/22/18   Alberteen Samanford, Christopher P, MD  fluconazole (DIFLUCAN) 150 MG tablet 1 po x1, may repeat in 3 days prn 02/10/18  Zola Button, Yvonne R, DO  FLUoxetine (PROZAC) 40 MG capsule TAKE 1 CAPSULE BY MOUTH EVERY DAY 09/25/17   Zola Button, Grayling Congress, DO  folic acid (FOLVITE) 1 MG tablet Take 1 tablet (1 mg total) by mouth daily. 01/23/18   Alberteen Sam, MD  OLANZapine (ZYPREXA) 10 MG tablet TAKE 1 TABLET BY MOUTH EVERY DAY 09/25/17   Zola Button, Myrene Buddy R, DO  ondansetron (ZOFRAN-ODT) 4 MG disintegrating tablet Take 0.5 tablets (2 mg total) by mouth every 8 (eight) hours as needed for nausea or vomiting. 02/19/18   Copland, Gwenlyn Found, MD  pantoprazole (PROTONIX) 40 MG tablet Take 1 tablet (40 mg total) by mouth 2 (two) times daily before a meal. 12/23/17   Zola Button, Grayling Congress, DO  pravastatin (PRAVACHOL) 20 MG tablet TAKE 1 TABLET BY MOUTH EVERY DAY 02/02/18   Seabron Spates R, DO  temazepam (RESTORIL) 7.5 MG capsule Take 1 capsule (7.5 mg total) by mouth at bedtime as needed for sleep. 02/11/18   Donato Schultz, DO  Vitamin D, Ergocalciferol, (DRISDOL) 50000 units CAPS capsule Take 1 capsule (50,000 Units total) by mouth every 7 (seven) days. 12/31/17   Donato Schultz, DO    Family History Family History  Problem Relation Age of Onset  . Stroke Mother   . Parkinsonism Mother     Social History Social History     Tobacco Use  . Smoking status: Never Smoker  . Smokeless tobacco: Never Used  Substance Use Topics  . Alcohol use: Not Currently    Frequency: Never    Comment: 01/29/2018 "couple drinks/year"  . Drug use: Never     Allergies   Codeine; Gabapentin; Gabapentin; Latex; Morphine sulfate; Morphine sulfate; Penicillins; Sertraline; Sertraline hcl; and Penicillins   Review of Systems Review of Systems  Unable to perform ROS: Mental status change     Physical Exam Updated Vital Signs BP 138/77 (BP Location: Right Arm)   Pulse 93   Temp 98.4 F (36.9 C) (Oral)   Resp 17   Wt 117.9 kg   SpO2 96%   BMI 41.97 kg/m   Physical Exam  Constitutional: She appears well-developed and well-nourished. No distress.  HENT:  Head: Normocephalic and atraumatic.  Eyes: Conjunctivae are normal. No scleral icterus.  Neck: Normal range of motion.  Cardiovascular: Normal rate, regular rhythm and normal heart sounds. Exam reveals no gallop and no friction rub.  No murmur heard. Pulmonary/Chest: Effort normal and breath sounds normal. No respiratory distress.  Abdominal: Soft. Bowel sounds are normal. She exhibits no distension and no mass. There is no tenderness. There is no guarding.  Neurological: She is alert.  Oriented to person place and year. Otherwise pleasantly confused. Resting tremor Speech is clear and goal oriented, follows commands Major Cranial nerves without deficit, no facial droop Normal strength in upper and lower extremities bilaterally including dorsiflexion and plantar flexion, strong and equal grip strength Sensation normal to light and sharp touch Moves extremities without ataxia, coordination intact Normal finger to nose no pronator drift  Skin: Skin is warm and dry. She is not diaphoretic.  Psychiatric: Her behavior is normal.  Nursing note and vitals reviewed.    ED Treatments / Results  Labs (all labs ordered are listed, but only abnormal results are  displayed) Labs Reviewed - No data to display  EKG None  Radiology No results found.  Procedures Procedures (including critical care time)  Medications Ordered in ED Medications - No data  to display   Initial Impression / Assessment and Plan / ED Course  I have reviewed the triage vital signs and the nursing notes.  Pertinent labs & imaging results that were available during my care of the patient were reviewed by me and considered in my medical decision making (see chart for details).     This is a 85 year old female with seizure-like activity tonight.  I discussed the case with Dr. Laurence Slate who suggests Keppra loading dose and p.o. Keppra 500 mg twice daily.  He states that she may have an outpatient follow-up given her extensive inpatient work-up recently with Dr. Arlana Pouch.  The patient's potassium is slightly low and I have ordered p.o. repletion with 40 mEq.  Her labs are otherwise without significant abnormality.  Patient appears appropriate for discharge.  I have discussed all findings with the daughter and outpatient follow-up along with return precautions.  Final Clinical Impressions(s) / ED Diagnoses   Final diagnoses:  Witnessed seizure-like activity [facility])    ED Discharge Orders         Ordered    levETIRAcetam (KEPPRA) 500 MG tablet  2 times daily     02/22/18 2356           Arthor Captain, PA-C 02/23/18 0006    Little, Ambrose Finland, MD 02/28/18 1151

## 2018-02-23 DIAGNOSIS — R569 Unspecified convulsions: Secondary | ICD-10-CM | POA: Diagnosis not present

## 2018-02-23 MED ORDER — POTASSIUM CHLORIDE CRYS ER 20 MEQ PO TBCR
40.0000 meq | EXTENDED_RELEASE_TABLET | Freq: Once | ORAL | Status: AC
  Administered 2018-02-23: 40 meq via ORAL
  Filled 2018-02-23: qty 2

## 2018-02-23 NOTE — ED Notes (Signed)
Called ptar for pt, [name]  

## 2018-02-23 NOTE — Telephone Encounter (Signed)
Thank you :)

## 2018-03-02 ENCOUNTER — Telehealth: Payer: Self-pay | Admitting: *Deleted

## 2018-03-02 DIAGNOSIS — M549 Dorsalgia, unspecified: Secondary | ICD-10-CM

## 2018-03-02 DIAGNOSIS — G20 Parkinson's disease: Secondary | ICD-10-CM

## 2018-03-02 DIAGNOSIS — G8929 Other chronic pain: Secondary | ICD-10-CM

## 2018-03-02 DIAGNOSIS — R531 Weakness: Secondary | ICD-10-CM

## 2018-03-02 NOTE — Telephone Encounter (Signed)
Copied from CRM [phone]. Topic: General - Other >> Feb 27, 2018  2:15 PM Jaquita Rectoravis, Karen A wrote: Reason for CRM: Loistine SimasJohanna patients daughter called to say that they need an Rx for a motorized wheel chair sent to [facility]. Per therapist and family  Fax# [phone].             Loistine SimasJohanna Ph# [phone]

## 2018-03-05 NOTE — Telephone Encounter (Signed)
Spoke with daughter earlier today in regards to message.  She stated that they need motorized wheelchair because patient has now became immobile and it is hard to move her.  She also asked for an rx for a hospital bed.  Rxs done for both and sent to [facility]

## 2018-03-06 ENCOUNTER — Ambulatory Visit (INDEPENDENT_AMBULATORY_CARE_PROVIDER_SITE_OTHER): Payer: Medicare Other | Admitting: Family Medicine

## 2018-03-06 ENCOUNTER — Encounter: Payer: Self-pay | Admitting: Family Medicine

## 2018-03-06 VITALS — BP 126/65 | HR 85 | Temp 98.7°F | Resp 18

## 2018-03-06 DIAGNOSIS — L8990 Pressure ulcer of unspecified site, unspecified stage: Secondary | ICD-10-CM

## 2018-03-06 DIAGNOSIS — G20 Parkinson's disease: Secondary | ICD-10-CM | POA: Diagnosis not present

## 2018-03-06 DIAGNOSIS — G8929 Other chronic pain: Secondary | ICD-10-CM

## 2018-03-06 DIAGNOSIS — M25571 Pain in right ankle and joints of right foot: Secondary | ICD-10-CM

## 2018-03-06 DIAGNOSIS — E441 Mild protein-calorie malnutrition: Secondary | ICD-10-CM

## 2018-03-06 DIAGNOSIS — L089 Local infection of the skin and subcutaneous tissue, unspecified: Secondary | ICD-10-CM

## 2018-03-06 LAB — TSH: TSH: 0.7 m[IU]/L (ref 0.40–4.50)

## 2018-03-06 MED ORDER — HYDROCODONE-ACETAMINOPHEN 5-325 MG PO TABS: 1.0000 | ORAL_TABLET | Freq: Four times a day (QID) | ORAL | 0 refills | Status: DC | PRN

## 2018-03-06 MED ORDER — DOXYCYCLINE HYCLATE 100 MG PO TABS: 100.0000 mg | ORAL_TABLET | Freq: Two times a day (BID) | ORAL | 0 refills | Status: DC

## 2018-03-06 NOTE — Patient Instructions (Signed)
Preventing Pressure Injuries  What is a pressure injury?    A pressure injury, previously called a bedsore or a pressure ulcer, is an injury to the skin and underlying tissue caused by pressure. A pressure injury can happen when your skin presses against a surface, such as a mattress or wheelchair seat, for too long. The pressure on the blood vessels causes reduced blood flow to your skin. This can eventually cause the skin tissue to die and break down into a wound.  Pressure injuries usually develop:  · Over bony parts of the body, such as the tailbone, shoulders, elbows, hips, and heels.  · Under medical devices, such as respiratory equipment, stockings, tubes, and splints.  They can cause pain, muscle damage, and infection.  How do pressure injuries happen?  Pressure injuries are caused by a lack of blood supply to an area of skin. These injuries begin as a reddened area on the skin and can become an open sore. They can result from intense pressure over a short period of time or from less pressure over a long period of time. Pressure injuries can vary in severity.  This condition is more likely to develop in people who:  · Are in the hospital or an extended care facility.  · Are bedridden or in a wheelchair.  · Have an injury or disease that keeps them from:  ? Moving normally.  ? Feeling pain or pressure.  ? Communicating if they feel pain or pressure.  · Have a condition that:  ? Makes them sleepy or less alert.  ? Causes poor blood flow.  · Need to wear a medical device.  · Have poor control of their bladder or bowel functions (incontinence).  · Have poor nutrition (malnutrition).  · Have had this condition before.  · Are of certain ethnicities. People of African American and Latino or Hispanic descent are at higher risk compared to other ethnic groups.  What actions can I take to prevent pressure injuries?  Skin Care  · Keep your skin clean and dry. Gently pat your skin dry.  · Do not rub or massage boney  areas of your skin.  · Moisturize dry skin.  · Use gentle cleansers and skin protectants routinely if you are incontinent.  · Check your skin every day for any changes in color and for any new blisters or sores. Make sure to check under and around any medical devices and between skin folds. Have a caregiver do this for you if you are not able.  Reducing and Redistributing Pressure  · Do not lie or sit in one position for a long time. Move or change position every two hours, or as told by your health care provider.  · Use pillows or cushions to redistribute pressure. Ask your health care provider to recommend cushions or pads for you.  · Use medical devices that to not rub your skin. Tell your health care provider if one of your medical devices is causing pain or irritation.  Medicines  · Take over-the-counter and prescription medicines only as told by your health care provider.  · If you were prescribed an antibiotic medicine, take it or apply it as told by your health care provider. Do not stop taking or using the antibiotic even if your condition improves.  General Instructions    · Be as active as you can every day. Ask your health care provider to suggest safe exercises or activities.  · Work with your health care   provider to manage any chronic health conditions.  · Eat a healthy diet that includes lots of protein. Ask your health care provider for diet advice.  · Drink enough fluid to keep your urine clear or pale yellow.  · Do not abuse drugs or alcohol.  · Do not smoke.  · Keep all follow-up visits as told by your health care provider. This is important.  What steps will be taken to prevent pressure injuries if I am in the hospital?  Your health care providers:  · Will inspect your skin at least daily. Skin under or around medical devices should be checked at least twice a day while you are in the hospital.  · May recommend that you use certain types of bedding to help prevent them. These may include a pad,  mattress, or chair cushion that is filled with gel, air, water, or foam.  · Will evaluate your nutrition and consult a diet specialist (dietician), if needed.  · Will inspect and change any wound dressings regularly.  · May help you move into different positions every few hours.  · Will adjust any medical devices and braces as needed to limit pressure on your skin.  · Will keep your skin clean and dry.  · May use gentle cleansers and skin protectants, if you are incontinent.  · Will moisturize any dry skin.  Make sure that you let your health care provider know if you feel or see any changes in your skin.  This information is not intended to replace advice given to you by your health care provider. Make sure you discuss any questions you have with your health care provider.  Document Released: 04/11/2004 Document Revised: 09/19/2016 Document Reviewed: 12/08/2014  [employer]vier Interactive Patient Education © 2019 [employer]

## 2018-03-06 NOTE — Progress Notes (Addendum)
Patient ID: [name], female    DOB: 1933-04-02  Age: 84 y.o. MRN: [id number]    Subjective:  Subjective  HPI APREL EGELHOFF presents for mobility evaluation.  She has parkinsons, hx PE, and seizures.  She is homebound and in a wheelchair  The daughter is wanting a power wheelchair she can control , not the pt---- I told her I did not think this was available but we would discuss this with home health.  They also need a hospital bed.  The pt has a bed sore but we are unable to see it because pt is unable to stand on her own safely.    Review of Systems  Constitutional: Negative for appetite change, diaphoresis, fatigue and unexpected weight change.  Eyes: Negative for pain, redness and visual disturbance.  Respiratory: Negative for cough, chest tightness, shortness of breath and wheezing.   Cardiovascular: Negative for chest pain, palpitations and leg swelling.  Endocrine: Negative for cold intolerance, heat intolerance, polydipsia, polyphagia and polyuria.  Genitourinary: Negative for difficulty urinating, dysuria and frequency.  Musculoskeletal: Positive for arthralgias, back pain and gait problem.  Skin: Positive for wound.  Neurological: Positive for weakness. Negative for dizziness, light-headedness, numbness and headaches.  Psychiatric/Behavioral: Positive for confusion, decreased concentration and dysphoric mood.    History Past Medical History:  Diagnosis Date  . Allergic drug reaction    Opioids  . Anxiety   . 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 Arthritis    "knees" (01/29/2018)  . Back pain, thoracic   . Benign familial tremor   . Bruise   . Calf pain   . Chest pain    -- Myoview negative 2007  . Chronic pain syndrome   . Depression   . Edema   . Gallstones   . GERD (gastroesophageal reflux disease)   . Heart murmur   . High cholesterol   . History of blood transfusion    "related to a surgery"  . History of  hiatal hernia   . Hypertension   . Hypokalemia   . Meniscus tear   . Myalgia   . Osteoarthritis   . Other symptoms referable to lower leg joint   . Other urinary incontinence   . Pneumonia    "once" (01/29/2018)  . PONV (postoperative nausea and vomiting)   . Scoliosis   . Skin rash   . Tinea corporis   . Urinary incontinence     She has a past surgical history that includes Abdominal hysterectomy; Rotator cuff repair; Lumbar laminectomy/decompression microdiscectomy (2007); Knee arthroscopy; Breast lumpectomy; Tonsillectomy; Joint replacement; Total knee arthroplasty (Bilateral); Back surgery; Posterior lumbar fusion; Shoulder arthroscopy w/ rotator cuff repair (Right); Vaginal hysterectomy; Dilation and curettage of uterus; Knee arthroscopy (Bilateral); Breast biopsy (Right); and Breast lumpectomy (Right).   Her family history includes Parkinsonism in her mother; Stroke in her mother.She reports that she has never smoked. She has never used smokeless tobacco. She reports previous alcohol use. She reports that she does not use drugs.  Current Outpatient Medications on File Prior to Visit  Medication Sig Dispense Refill  . apixaban (ELIQUIS) 5 MG TABS tablet Take 1 tablet (5 mg total) by mouth 2 (two) times daily. Take 10 mg twice daily for 1 week then take 5 mg twice daily (Patient taking differently: Take 5 mg by mouth 2 (two) times daily. ) 90 tablet 1  . Cholecalciferol (VITAMIN D-3) 1000 units CAPS Take 1 capsule (  1,000 Units total) by mouth daily. 30 capsule 5  . desoximetasone (TOPICORT) 0.05 % cream APPLY TO AFFECTED AREA TWICE A DAY (Patient taking differently: Apply 1 application topically 2 (two) times daily as needed (dermatitis). ) 60 g 1  . famotidine (PEPCID) 20 MG tablet Take 20 mg by mouth 2 (two) times daily.    . feeding supplement, ENSURE ENLIVE, (ENSURE ENLIVE) LIQD Take 237 mLs by mouth 2 (two) times daily between meals. 237 mL 12  . folic acid (FOLVITE) 1 MG tablet  Take 1 tablet (1 mg total) by mouth daily. 30 tablet 2  . levETIRAcetam (KEPPRA) 500 MG tablet Take 1 tablet (500 mg total) by mouth 2 (two) times daily. 60 tablet 2  . ondansetron (ZOFRAN-ODT) 4 MG disintegrating tablet Take 0.5 tablets (2 mg total) by mouth every 8 (eight) hours as needed for nausea or vomiting. 20 tablet 0  . pantoprazole (PROTONIX) 40 MG tablet Take 1 tablet (40 mg total) by mouth 2 (two) times daily before a meal. 60 tablet 3  . Vitamin D, Ergocalciferol, (DRISDOL) 50000 units CAPS capsule Take 1 capsule (50,000 Units total) by mouth every 7 (seven) days. (Patient taking differently: Take 50,000 Units by mouth every 7 (seven) days. Monday) 4 capsule 2   No current facility-administered medications on file prior to visit.      Objective:  Objective  Physical Exam Vitals signs and nursing note reviewed.  Constitutional:      Appearance: She is well-developed.  HENT:     Head: Normocephalic and atraumatic.  Eyes:     Conjunctiva/sclera: Conjunctivae normal.  Neck:     Musculoskeletal: Normal range of motion and neck supple.     Thyroid: No thyromegaly.     Vascular: No carotid bruit or JVD.  Cardiovascular:     Rate and Rhythm: Normal rate and regular rhythm.     Heart sounds: Normal heart sounds. No murmur.  Pulmonary:     Effort: Pulmonary effort is normal. No respiratory distress.     Breath sounds: Normal breath sounds. No wheezing or rales.  Chest:     Chest wall: No tenderness.  Skin:    Comments: Pt daughter states she has a bed sore very close to the rectum but we are unable to see it---- pt can not stand long enough or get on the table  She and the caretaker state it looks infected   Neurological:     Mental Status: She is alert. Mental status is at baseline.     Motor: Weakness present.     Comments: Pt unable to walk or even stand up She has  A lot of pain due to sore on her bottom    BP 126/65 (BP Location: Left Wrist, Cuff Size: Normal)    Pulse 85   Temp 98.7 F (37.1 C) (Oral)   Resp 18   SpO2 98%  Wt Readings from Last 3 Encounters:  04/10/18 254 lb (115.2 kg)  03/13/18 254 lb (115.2 kg)  02/22/18 260 lb (117.9 kg)     Lab Results  Component Value Date   WBC 8.1 03/10/2018   HGB 9.9 (L) 03/10/2018   HCT 32.3 (L) 03/10/2018   PLT 422 (H) 03/10/2018   GLUCOSE 137 (H) 03/10/2018   CHOL 130 02/28/2017   TRIG 71.0 02/28/2017   HDL 44.20 02/28/2017   LDLDIRECT 155.7 09/17/2012   LDLCALC 71 02/28/2017   ALT 3 (L) 03/06/2018   AST 7 (L) 03/06/2018  NA 141 03/10/2018   K 2.9 (L) 03/10/2018   CL 105 03/10/2018   CREATININE 1.24 (H) 03/10/2018   BUN 20 03/10/2018   CO2 22 03/10/2018   TSH 0.70 03/06/2018   INR 2.09 01/27/2018   HGBA1C 6.2 05/11/2014   MICROALBUR 0.8 05/11/2014    No results found.   Assessment & Plan:  Plan  I have discontinued Joanette GulaJoan M. Behanna's fluconazole and primidone. I am also having her start on HYDROcodone-acetaminophen. Additionally, I am having her maintain her desoximetasone, pantoprazole, famotidine, Vitamin D (Ergocalciferol), Vitamin D-3, folic acid, feeding supplement (ENSURE ENLIVE), apixaban, ondansetron, and levETIRAcetam.  Meds ordered this encounter  Medications  . DISCONTD: doxycycline (VIBRA-TABS) 100 MG tablet    Sig: Take 1 tablet (100 mg total) by mouth 2 (two) times daily.    Dispense:  20 tablet    Refill:  0  . HYDROcodone-acetaminophen (NORCO/VICODIN) 5-325 MG tablet    Sig: Take 1 tablet by mouth every 6 (six) hours as needed for moderate pain.    Dispense:  30 tablet    Refill:  0    Problem List Items Addressed This Visit      Unprioritized   Parkinson disease (HCC)    I am referring the pt for PT for mobility eval for power wheelchair      Relevant Orders   Ambulatory referral to Home Health   TSH (Completed)    Other Visit Diagnoses    Acute right ankle pain    -  Primary   Relevant Orders   DG Ankle Complete Right (Completed)   TSH  (Completed)   Infected decubitus ulcer, unspecified ulcer stage       Relevant Orders   TSH   CBC with Differential/Platelet (Completed)   Comprehensive metabolic panel (Completed)   Ambulatory referral to Home Health   TSH (Completed)   Mild protein-calorie malnutrition (HCC)       Relevant Orders   TSH   CBC with Differential/Platelet (Completed)   Comprehensive metabolic panel (Completed)   Ambulatory referral to Home Health   TSH (Completed)   Other chronic pain       Relevant Medications   HYDROcodone-acetaminophen (NORCO/VICODIN) 5-325 MG tablet      Follow-up: Return in about 6 months (around 09/05/2018).  Donato SchultzYvonne R Lowne Chase, DO

## 2018-03-07 LAB — COMPREHENSIVE METABOLIC PANEL
AG Ratio: 1.3 (calc) (ref 1.0–2.5)
ALT: 3 U/L — ABNORMAL LOW (ref 6–29)
AST: 7 U/L — ABNORMAL LOW (ref 10–35)
Albumin: 3.7 g/dL (ref 3.6–5.1)
Alkaline phosphatase (APISO): 149 U/L — ABNORMAL HIGH (ref 33–130)
BUN/Creatinine Ratio: 18 (calc) (ref 6–22)
BUN: 29 mg/dL — ABNORMAL HIGH (ref 7–25)
CO2: 22 mmol/L (ref 20–32)
Calcium: 9.1 mg/dL (ref 8.6–10.4)
Chloride: 104 mmol/L (ref 98–110)
Creat: 1.62 mg/dL — ABNORMAL HIGH (ref 0.60–0.88)
Globulin: 2.9 g/dL (calc) (ref 1.9–3.7)
Glucose, Bld: 146 mg/dL — ABNORMAL HIGH (ref 65–99)
Potassium: 3.5 mmol/L (ref 3.5–5.3)
Sodium: 143 mmol/L (ref 135–146)
Total Bilirubin: 0.8 mg/dL (ref 0.2–1.2)
Total Protein: 6.6 g/dL (ref 6.1–8.1)

## 2018-03-07 LAB — CBC WITH DIFFERENTIAL/PLATELET
Absolute Monocytes: 888 cells/uL (ref 200–950)
Basophils Absolute: 48 cells/uL (ref 0–200)
Basophils Relative: 0.6 %
Eosinophils Absolute: 616 cells/uL — ABNORMAL HIGH (ref 15–500)
Eosinophils Relative: 7.7 %
HCT: 29.3 % — ABNORMAL LOW (ref 35.0–45.0)
Hemoglobin: 9.5 g/dL — ABNORMAL LOW (ref 11.7–15.5)
Lymphs Abs: 1096 cells/uL (ref 850–3900)
MCH: 26.9 pg — ABNORMAL LOW (ref 27.0–33.0)
MCHC: 32.4 g/dL (ref 32.0–36.0)
MCV: 83 fL (ref 80.0–100.0)
MPV: 11.6 fL (ref 7.5–12.5)
Monocytes Relative: 11.1 %
NEUTROS ABS: 5352 {cells}/uL (ref 1500–7800)
Neutrophils Relative %: 66.9 %
Platelets: 393 10*3/uL (ref 140–400)
RBC: 3.53 10*6/uL — ABNORMAL LOW (ref 3.80–5.10)
RDW: 20.2 % — ABNORMAL HIGH (ref 11.0–15.0)
Total Lymphocyte: 13.7 %
WBC: 8 10*3/uL (ref 3.8–10.8)

## 2018-03-09 ENCOUNTER — Telehealth: Payer: Self-pay | Admitting: Family Medicine

## 2018-03-09 ENCOUNTER — Telehealth: Payer: Self-pay | Admitting: *Deleted

## 2018-03-09 NOTE — Telephone Encounter (Signed)
Copied from CRM [phone]. Topic: Quick Communication - See Telephone Encounter >> Mar 09, 2018 10:29 AM Herby AbrahamJohnson, Shiquita C wrote: CRM for notification. See Telephone encounter for: 03/09/18.  Pt's daughter Charolett BumpersJohanna tel [phone] called in to be advise and to seek assistance.   Daughter says that [facility] stated that they didn't receive the Rx for the bed order. Also, daughter says that mom has a bed sore, she would like to know if there is anything topical that she could suggest /prescribe that they could use?

## 2018-03-09 NOTE — Telephone Encounter (Signed)
Megan returning call, stating she will put in hgb order today, and nurse who arrives to the house tomorrow will be able to draw and relay results.

## 2018-03-09 NOTE — Telephone Encounter (Signed)
Please let pt daughter know

## 2018-03-09 NOTE — Telephone Encounter (Signed)
[facility]  Can you please respond to something they can do for bed sore

## 2018-03-09 NOTE — Telephone Encounter (Signed)
Per Dr. Zola ButtonLowne-Chase call to check with home health to see if they can check a hemoglobin level.  Left message on machine for Megan to see if they can do so.

## 2018-03-09 NOTE — Telephone Encounter (Signed)
I ordered home health to look at it so they can evaluate her and see what is needed

## 2018-03-10 ENCOUNTER — Emergency Department (HOSPITAL_COMMUNITY): Payer: Medicare Other

## 2018-03-10 ENCOUNTER — Encounter (HOSPITAL_COMMUNITY): Payer: Self-pay

## 2018-03-10 ENCOUNTER — Emergency Department (HOSPITAL_COMMUNITY)
Admission: EM | Admit: 2018-03-10 | Discharge: 2018-03-10 | Disposition: A | Discharge status: Home / Self Care | Payer: Medicare Other | Attending: Emergency Medicine | Admitting: Emergency Medicine

## 2018-03-10 DIAGNOSIS — G20 Parkinson's disease: Secondary | ICD-10-CM | POA: Diagnosis not present

## 2018-03-10 DIAGNOSIS — Z91040 Latex allergy status: Secondary | ICD-10-CM | POA: Diagnosis not present

## 2018-03-10 DIAGNOSIS — Z79899 Other long term (current) drug therapy: Secondary | ICD-10-CM | POA: Diagnosis not present

## 2018-03-10 DIAGNOSIS — I10 Essential (primary) hypertension: Secondary | ICD-10-CM | POA: Insufficient documentation

## 2018-03-10 DIAGNOSIS — F419 Anxiety disorder, unspecified: Secondary | ICD-10-CM | POA: Diagnosis present

## 2018-03-10 LAB — CBC WITH DIFFERENTIAL/PLATELET
Abs Immature Granulocytes: 0.05 10*3/uL (ref 0.00–0.07)
BASOS PCT: 1 %
Basophils Absolute: 0.1 10*3/uL (ref 0.0–0.1)
Eosinophils Absolute: 0.4 10*3/uL (ref 0.0–0.5)
Eosinophils Relative: 5 %
HCT: 32.3 % — ABNORMAL LOW (ref 36.0–46.0)
HEMOGLOBIN: 9.9 g/dL — AB (ref 12.0–15.0)
IMMATURE GRANULOCYTES: 1 %
Lymphocytes Relative: 11 %
Lymphs Abs: 0.9 10*3/uL (ref 0.7–4.0)
MCH: 26.7 pg (ref 26.0–34.0)
MCHC: 30.7 g/dL (ref 30.0–36.0)
MCV: 87.1 fL (ref 80.0–100.0)
Monocytes Absolute: 1.3 10*3/uL — ABNORMAL HIGH (ref 0.1–1.0)
Monocytes Relative: 16 %
Neutro Abs: 5.4 10*3/uL (ref 1.7–7.7)
Neutrophils Relative %: 66 %
Platelets: 422 10*3/uL — ABNORMAL HIGH (ref 150–400)
RBC: 3.71 MIL/uL — ABNORMAL LOW (ref 3.87–5.11)
RDW: 21.8 % — ABNORMAL HIGH (ref 11.5–15.5)
WBC: 8.1 10*3/uL (ref 4.0–10.5)
nRBC: 0 % (ref 0.0–0.2)

## 2018-03-10 LAB — BASIC METABOLIC PANEL
Anion gap: 14 (ref 5–15)
BUN: 20 mg/dL (ref 8–23)
CO2: 22 mmol/L (ref 22–32)
Calcium: 9.2 mg/dL (ref 8.9–10.3)
Chloride: 105 mmol/L (ref 98–111)
Creatinine, Ser: 1.24 mg/dL — ABNORMAL HIGH (ref 0.44–1.00)
GFR calc Af Amer: 47 mL/min — ABNORMAL LOW (ref >60)
GFR calc non Af Amer: 40 mL/min — ABNORMAL LOW (ref >60)
Glucose, Bld: 137 mg/dL — ABNORMAL HIGH (ref 70–99)
Potassium: 2.9 mmol/L — ABNORMAL LOW (ref 3.5–5.1)
Sodium: 141 mmol/L (ref 135–145)

## 2018-03-10 LAB — URINALYSIS, ROUTINE W REFLEX MICROSCOPIC
BILIRUBIN URINE: NEGATIVE
Glucose, UA: NEGATIVE mg/dL
Ketones, ur: 5 mg/dL — AB
Leukocytes, UA: NEGATIVE
Nitrite: NEGATIVE
Protein, ur: 30 mg/dL — AB
RBC / HPF: >50 RBC/hpf — ABNORMAL HIGH (ref 0–5)
SPECIFIC GRAVITY, URINE: 1.023 (ref 1.005–1.030)
pH: 5 (ref 5.0–8.0)

## 2018-03-10 NOTE — ED Notes (Signed)
Bed: RESB Expected date:  Expected time:  Means of arrival:  Comments: Anxiety/UTI

## 2018-03-10 NOTE — ED Notes (Signed)
Daughter says that she has Parkinson's and that causes her anxiety also

## 2018-03-10 NOTE — ED Notes (Signed)
Pt. Remains in no distress. Her daughter has been with her for quite some time now.

## 2018-03-10 NOTE — ED Notes (Signed)
[facility] will be at the hospital at approximately 1040 for transport home.

## 2018-03-10 NOTE — Telephone Encounter (Signed)
Received call from daughter Loistine SimasJohanna.  She stated that the hospital recommended that she talk with pcp about getting patient something for panic attacks.  Also she is asking and needing more help with patient, like 24/7 care, around the clock care like a cna.  Can you help with this?  Daughter stated that you can call her at home.

## 2018-03-10 NOTE — Telephone Encounter (Signed)
Looks like patient checked into ED today.  Left message on phone to let daughter know about this and to call back with any questions.  I also let her know that I will call back on Thursday if I have not heard back from them today.    Also [facility], they do not have any fully electronic beds and that medicare does not cover them and they will call her about that when they call to go over ins.  Also [facility]  To evaluate and treat bed sore.  Will check on this as well on Thursday to make sure it is done.    FYI-Dr. Laury AxonLowne

## 2018-03-10 NOTE — ED Triage Notes (Signed)
Pt complains of being very anxious and scared, she started to vomit in route here

## 2018-03-10 NOTE — ED Triage Notes (Signed)
Her daughter says that her urine is really dark and condentrated

## 2018-03-10 NOTE — ED Provider Notes (Signed)
[facility]-EMERGENCY DEPT Provider Note   CSN: [id number] Arrival date & time: 03/10/18  3244     History   Chief Complaint Chief Complaint  Patient presents with  . anxious  . Emesis  . Urinary Tract Infection    HPI [name] is a 84 y.o. female.  The history is provided by the patient and a caregiver.  Anxiety  This is a recurrent problem. The current episode started 3 to 5 hours ago. The problem occurs every several days. The problem has been resolved. Pertinent negatives include no chest pain, no abdominal pain, no headaches and no shortness of breath. Nothing aggravates the symptoms. Nothing relieves the symptoms. She has tried rest for the symptoms. The treatment provided significant relief.    Past Medical History:  Diagnosis Date  . Allergic drug reaction    Opioids  . Anxiety   . 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 Arthritis    "knees" (01/29/2018)  . Back pain, thoracic   . Benign familial tremor   . Bruise   . Calf pain   . Chest pain    -- Myoview negative 2007  . Chronic pain syndrome   . Depression   . Edema   . Gallstones   . GERD (gastroesophageal reflux disease)   . Heart murmur   . High cholesterol   . History of blood transfusion    "related to a surgery"  . History of hiatal hernia   . Hypertension   . Hypokalemia   . Meniscus tear   . Myalgia   . Osteoarthritis   . Other symptoms referable to lower leg joint   . Other urinary incontinence   . Pneumonia    "once" (01/29/2018)  . PONV (postoperative nausea and vomiting)   . Scoliosis   . Skin rash   . Tinea corporis   . Urinary incontinence     Patient Active Problem List   Diagnosis Date Noted  . History of community acquired pneumonia 02/08/2018  . Pulmonary embolism and infarction (HCC) 02/08/2018  . Syncope, vasovagal 01/28/2018  . Parkinson disease (HCC) 01/28/2018  . Essential hypertension  01/28/2018  . Dyslipidemia 01/28/2018  . GERD (gastroesophageal reflux disease) 01/28/2018  . Pulmonary embolism (HCC) 01/28/2018  . Lactic acidosis 01/27/2018  . Acute pulmonary embolism (HCC) 01/20/2018  . Hypertensive urgency 01/19/2018  . Generalized weakness 01/19/2018  . ARF (acute renal failure) (HCC) 01/19/2018  . Weakness generalized 01/19/2018  . AKI (acute kidney injury) (HCC)   . Vitamin B 12 deficiency 01/01/2018  . Low vitamin D level 01/01/2018  . Syncope and collapse 12/30/2017  . Nausea and vomiting 12/29/2017  . Prolonged QT interval 12/29/2017  . CAP (community acquired pneumonia) 12/28/2017  . Dyspepsia 09/25/2017  . 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 Toe pain, right 11/17/2016  . Left shoulder pain 01/19/2016  . Hyperlipidemia 05/03/2014  . Disc disorder of lumbar region 12/30/2013  . Obesity (BMI 30-39.9) 07/01/2013  . Biliary dyskinesia 12/10/2011  . UTI (urinary tract infection) 07/24/2011  . Lumbar back pain 06/12/2011  . Symptomatic Parkinson disease (HCC) 02/26/2011  . Depression 09/28/2010  . Pre-operative cardiovascular examination 07/23/2010  . CAROTID BRUIT, RIGHT 06/29/2009  . CHEST PAIN-UNSPECIFIED 01/13/2009  . SKIN RASH 10/11/2008  . EDEMA 08/25/2008  . OTHER URINARY INCONTINENCE 02/15/2008  . CHRONIC PAIN SYNDROME 12/31/2007  . BRUISE 09/14/2007  . SCOLIOSIS, LUMBAR SPINE 06/15/2007  . BACK PAIN,  THORACIC REGION, RIGHT 06/11/2007  . MENISCUS TEAR 02/25/2007  . OTHER SYMPTOMS REFERABLE TO LOWER LEG JOINT 02/17/2007  . MYALGIA 02/10/2007  . CALF PAIN, RIGHT 02/10/2007  . DEPRESSION 01/01/2007  . HYPOKALEMIA, HX OF 01/01/2007  . TINEA CORPORIS 09/22/2006  . ALLERGIC DRUG REACTION, OPIOIDS 09/22/2006  . Essential hypertension 08/20/2006  . OSTEOARTHRITIS 08/20/2006    Past Surgical History:  Procedure Laterality Date  . ABDOMINAL HYSTERECTOMY    . BACK SURGERY    . BREAST BIOPSY Right   . BREAST LUMPECTOMY    . BREAST LUMPECTOMY  Right    "no cancer"  . DILATION AND CURETTAGE OF UTERUS    . JOINT REPLACEMENT    . KNEE ARTHROSCOPY     Bilateral  . KNEE ARTHROSCOPY Bilateral   . LUMBAR LAMINECTOMY/DECOMPRESSION MICRODISCECTOMY  2007   L3-4,4-5, L5hemilaminectomy with leopard cage internal fiaxation L3-5  . POSTERIOR LUMBAR FUSION    . ROTATOR CUFF REPAIR     Right  . SHOULDER ARTHROSCOPY W/ ROTATOR CUFF REPAIR Right   . TONSILLECTOMY    . TOTAL KNEE ARTHROPLASTY Bilateral   . VAGINAL HYSTERECTOMY       OB History   No obstetric history on file.      Home Medications    Prior to Admission medications   Medication Sig Start Date End Date Taking? Authorizing Provider  amantadine (SYMMETREL) 100 MG capsule Take 1 capsule (100 mg total) by mouth 2 (two) times daily. Patient taking differently: Take 100 mg by mouth daily.  07/01/13  Yes Lowne Chase, Yvonne R, DO  amLODipine-valsartan (EXFORGE) 5-160 MG tablet Take 1 tablet by mouth daily. 02/03/18  Yes Donato SchultzLowne Chase, Yvonne R, DO  apixaban (ELIQUIS) 5 MG TABS tablet Take 1 tablet (5 mg total) by mouth 2 (two) times daily. Take 10 mg twice daily for 1 week then take 5 mg twice daily Patient taking differently: Take 5 mg by mouth 2 (two) times daily.  02/16/18  Yes Seabron SpatesLowne Chase, Yvonne R, DO  carbidopa-levodopa (SINEMET IR) 10-100 MG tablet Take 1 tablet by mouth 4 (four) times daily.   Yes [provider]  carbidopa-levodopa (SINEMET IR) 25-100 MG tablet Take 3 tablets by mouth 4 (four) times daily.  01/14/18  Yes [provider]  desoximetasone (TOPICORT) 0.05 % cream APPLY TO AFFECTED AREA TWICE A DAY Patient taking differently: Apply 1 application topically 2 (two) times daily as needed (dermatitis).  01/16/16  Yes Donato SchultzLowne Chase, Yvonne R, DO  doxycycline (VIBRA-TABS) 100 MG tablet Take 1 tablet (100 mg total) by mouth 2 (two) times daily. 03/06/18  Yes Seabron SpatesLowne Chase, Yvonne R, DO  famotidine (PEPCID) 20 MG tablet Take 20 mg by mouth 2 (two) times  daily.   Yes [provider]  FLUoxetine (PROZAC) 40 MG capsule TAKE 1 CAPSULE BY MOUTH EVERY DAY Patient taking differently: Take 40 mg by mouth daily.  09/25/17  Yes Donato SchultzLowne Chase, Yvonne R, DO  folic acid (FOLVITE) 1 MG tablet Take 1 tablet (1 mg total) by mouth daily. 01/23/18  Yes Danford, Earl Liteshristopher P, MD  HYDROcodone-acetaminophen (NORCO/VICODIN) 5-325 MG tablet Take 1 tablet by mouth every 6 (six) hours as needed for moderate pain. 03/06/18  Yes Seabron SpatesLowne Chase, Yvonne R, DO  lactose free nutrition (BOOST) LIQD Take 237 mLs by mouth 2 (two) times daily as needed (nutritional supplement.).   Yes [provider]  levETIRAcetam (KEPPRA) 500 MG tablet Take 1 tablet (500 mg total) by mouth 2 (two) times daily. 02/22/18  Yes Harris, Abigail, PA-C  OLANZapine (ZYPREXA) 10 MG tablet TAKE 1 TABLET BY MOUTH EVERY DAY Patient taking differently: Take 10 mg by mouth at bedtime.  09/25/17  Yes Seabron Spates R, DO  ondansetron (ZOFRAN-ODT) 4 MG disintegrating tablet Take 0.5 tablets (2 mg total) by mouth every 8 (eight) hours as needed for nausea or vomiting. 02/19/18  Yes Copland, Gwenlyn Found, MD  pantoprazole (PROTONIX) 40 MG tablet Take 1 tablet (40 mg total) by mouth 2 (two) times daily before a meal. 12/23/17  Yes Lowne Chase, Yvonne R, DO  pravastatin (PRAVACHOL) 20 MG tablet TAKE 1 TABLET BY MOUTH EVERY DAY Patient taking differently: Take 20 mg by mouth daily.  02/02/18  Yes Seabron Spates R, DO  temazepam (RESTORIL) 7.5 MG capsule Take 1 capsule (7.5 mg total) by mouth at bedtime as needed for sleep. 02/11/18  Yes Donato Schultz, DO  Vitamin D, Ergocalciferol, (DRISDOL) 50000 units CAPS capsule Take 1 capsule (50,000 Units total) by mouth every 7 (seven) days. Patient taking differently: Take 50,000 Units by mouth every 7 (seven) days. Monday 12/31/17  Yes Donato Schultz, DO  Cholecalciferol (VITAMIN D-3) 1000 units CAPS Take 1 capsule (1,000 Units total) by mouth  daily. Patient not taking: Reported on 03/10/2018 12/31/17   Seabron Spates R, DO  feeding supplement, ENSURE ENLIVE, (ENSURE ENLIVE) LIQD Take 237 mLs by mouth 2 (two) times daily between meals. Patient not taking: Reported on 03/10/2018 01/22/18   Alberteen Sam, MD    Family History Family History  Problem Relation Age of Onset  . Stroke Mother   . Parkinsonism Mother     Social History Social History   Tobacco Use  . Smoking status: Never Smoker  . Smokeless tobacco: Never Used  Substance Use Topics  . Alcohol use: Not Currently    Frequency: Never    Comment: 01/29/2018 "couple drinks/year"  . Drug use: Never     Allergies   Codeine; Gabapentin; Gabapentin; Latex; Morphine sulfate; Morphine sulfate; Penicillins; Sertraline; Sertraline hcl; and Penicillins   Review of Systems Review of Systems  Constitutional: Negative for chills and fever.  HENT: Negative for ear pain and sore throat.   Eyes: Negative for pain and visual disturbance.  Respiratory: Negative for cough and shortness of breath.   Cardiovascular: Negative for chest pain and palpitations.  Gastrointestinal: Negative for abdominal pain and vomiting.  Genitourinary: Negative for dysuria and hematuria.  Musculoskeletal: Negative for arthralgias and back pain.  Skin: Negative for color change and rash.  Neurological: Negative for seizures, syncope and headaches.  Psychiatric/Behavioral: The patient is nervous/anxious.   All other systems reviewed and are negative.    Physical Exam Updated Vital Signs BP (!) 125/98   Pulse 63   Temp 98 F (36.7 C) (Oral)   Resp 16   SpO2 100%   Physical Exam Vitals signs and nursing note reviewed.  Constitutional:      General: She is not in acute distress.    Appearance: She is well-developed.  HENT:     Head: Normocephalic and atraumatic.     Nose: Nose normal.     Mouth/Throat:     Mouth: Mucous membranes are moist.  Eyes:     Extraocular  Movements: Extraocular movements intact.     Conjunctiva/sclera: Conjunctivae normal.  Neck:     Musculoskeletal: Normal range of motion and neck supple.  Cardiovascular:     Rate and Rhythm: Normal rate and regular rhythm.  Pulses: Normal pulses.     Heart sounds: Normal heart sounds. No murmur.  Pulmonary:     Effort: Pulmonary effort is normal. No respiratory distress.     Breath sounds: Normal breath sounds.  Abdominal:     Palpations: Abdomen is soft.     Tenderness: There is no abdominal tenderness.  Musculoskeletal: Normal range of motion.  Skin:    General: Skin is warm and dry.  Neurological:     General: No focal deficit present.     Mental Status: She is alert and oriented to person, place, and time.  Psychiatric:        Mood and Affect: Mood normal.        Behavior: Behavior normal.        Thought Content: Thought content normal.        Judgment: Judgment normal.      ED Treatments / Results  Labs (all labs ordered are listed, but only abnormal results are displayed) Labs Reviewed  CBC WITH DIFFERENTIAL/PLATELET - Abnormal; Notable for the following components:      Result Value   RBC 3.71 (*)    Hemoglobin 9.9 (*)    HCT 32.3 (*)    RDW 21.8 (*)    Platelets 422 (*)    All other components within normal limits  BASIC METABOLIC PANEL - Abnormal; Notable for the following components:   Potassium 2.9 (*)    Glucose, Bld 137 (*)    Creatinine, Ser 1.24 (*)    GFR calc non Af Amer 40 (*)    GFR calc Af Amer 47 (*)    All other components within normal limits  URINALYSIS, ROUTINE W REFLEX MICROSCOPIC - Abnormal; Notable for the following components:   APPearance HAZY (*)    Hgb urine dipstick LARGE (*)    Ketones, ur 5 (*)    Protein, ur 30 (*)    RBC / HPF >50 (*)    Bacteria, UA RARE (*)    All other components within normal limits    EKG EKG Interpretation  Date/Time:  Tuesday March 10 2018 08:02:20 EST Ventricular Rate:  71 PR  Interval:    QRS Duration: 116 QT Interval:  405 QTC Calculation: 441 R Axis:   -33 Text Interpretation:  Sinus rhythm Ventricular premature complex Left ventricular hypertrophy Confirmed by Virgina Norfolk [phone]) on 03/10/2018 10:16:32 AM   Radiology Dg Chest 2 View  Result Date: 03/10/2018 CLINICAL DATA:  Anxiety. Parkinson's disease. Cough. EXAM: CHEST - 2 VIEW COMPARISON:  02/06/2018. FINDINGS: Poor inspiration. Grossly normal sized heart. Stable right diaphragmatic eventration and right basilar linear atelectasis or scarring. Thoracic spine and bilateral shoulder degenerative changes. Lumbar spine fixation hardware. IMPRESSION: No acute abnormality. Electronically Signed   By: Beckie Salts M.D.   On: 03/10/2018 08:47    Procedures Procedures (including critical care time)  Medications Ordered in ED Medications - No data to display   Initial Impression / Assessment and Plan / ED Course  I have reviewed the triage vital signs and the nursing notes.  Pertinent labs & imaging results that were available during my care of the patient were reviewed by me and considered in my medical decision making (see chart for details).     [name] is a 84 year old female with history of Parkinson's, anxiety, high cholesterol, hypertension who presents to the ED with possible panic attack.  Patient with normal vitals.  No fever.  Patient overall asymptomatic at this time.  Patient  according to caregiver gets anxious at times.  Will cause her to throw up.  Which happened today.  She is not on any anxiety medication.  She denies any chest pain, shortness of breath, neurological symptoms.  She is overall well-appearing.  Exam is unremarkable.  Lab work showed no significant anemia, electrolyte abnormality, kidney injury.  Urinalysis without signs of infection.  EKG shows sinus rhythm with no signs of ischemic changes.  Chest x-ray showed no signs of pneumonia, pneumothorax, pleural effusion.   Patient remained well throughout my care and family given reassurance and patient discharged in good condition.  Given return precautions.  Recommend follow-up with primary care doctor to discuss possible treatment for anxiety/long-term placement as she appears to be outgrown her home support.  This chart was dictated using voice recognition software.  Despite best efforts to proofread,  errors can occur which can change the documentation meaning.   Final Clinical Impressions(s) / ED Diagnoses   Final diagnoses:  Anxiety    ED Discharge Orders    None       Virgina NorfolkCuratolo, Ammar Moffatt, DO 03/10/18 1018

## 2018-03-10 NOTE — Telephone Encounter (Signed)
See there phone message

## 2018-03-12 NOTE — Telephone Encounter (Signed)
She probably does need an ov Please ask home health if there is a way to get her more help Maybe ask if she qualifies for palliative care ?

## 2018-03-12 NOTE — Progress Notes (Signed)
Cardiology Office Note:    Date:  03/13/2018   ID:  [name], DOB [birth year], MRN [id number]  PCP:  Donato Schultz, DO  Cardiologist:  Norman Herrlich, MD    Referring MD: Zola Button, Grayling Congress, *    ASSESSMENT:    1. Syncope and collapse   2. Neurogenic orthostatic hypotension (HCC)   3. Essential hypertension   4. Parkinson disease (HCC)    PLAN:    In order of problems listed above:  1. Clinically her problem is hypotension due to autonomic dysfunction, neurogenic related to her Parkinson disease.  Is very difficult problem to manage clinically first thing we will do is withdraw antihypertensives allowing permissive hypertension.  To mitigate symptoms she will use compression hose abdominal binder lightly salt her food and begin to drink 16 ounces of water in the morning before getting out of bed to stimulate her gastric pressor reflex.  Her family will monitor her blood pressure supine sitting and a possible standing if she continues to have symptomatic hypotension initiate Northera but office follow-up. 2. See above this is an ominous finding in the setting of Parkinson's disease usually a marker of disease progression and at times a trigger to consider palliative care 3. Worsened severe symptomatic hypotension discontinue her calcium channel blocker ARB 4. Worsened now with autonomic dysfunction and neurogenic hypotension   Next appointment: 3 weeks   Medication Adjustments/Labs and Tests Ordered: Current medicines are reviewed at length with the patient today.  Concerns regarding medicines are outlined above.  No orders of the defined types were placed in this encounter.  No orders of the defined types were placed in this encounter.   Chief Complaint  Patient presents with  . Loss of Consciousness    x 2 months    History of Present Illness:    [name] is a 84 y.o. female with a hx of Parkinson's disease chronic anticoagulation with previous  pulmonary embolism and recent episodes of seizure-like activity has been seen by neurology and placed on seizure medication.  She is referred with concerns of syncope by dr Laury Axon.  An extended 30-day telemetry monitor was performed results are still unavailable echocardiogram showed ejection fraction 60 to 65% otherwise unremarkable compliance with diet, lifestyle and medications: Yes closely supervised by her daughter and healthcare team  She has not done well since admitted to the hospital in October with pneumonia.  Afterwards she had acute kidney injury as well as pulmonary embolism and is anticoagulated she has had recurrent episodes of fall fainting as well as some brief seizure activity associated with loss of consciousness.  Her daughter is quite insightful tells me that she spoke to the neurologist who was not certain whether she had a seizure disorder.  At home they have had blood pressures in the range of 90 systolic and she questions why she is taking 2 antihypertensive medications  Great deal difficulty caring for her at home she is unable to stand with weakness and she has continued falls.  Unfortunately she is severely debilitated and weakened with Parkinson's disease her blood pressure in my office today is 104/60 in the right arm sitting if we try to stand her even though she cannot obtain an upright posture 96/58 and has to stop due to weakness.  I reviewed her event monitor which is yet to be interpreted performed at [facility] and it shows episodes of rapid rhythm look like sinus tachycardia some bradycardia but no pauses of  greater than 3 seconds and episodes of wencheblock first-degree AV block but not higher grade.  I reviewed with the patient's daughter I think the predominant problem here is hypotension associated with her neurologic disease neurogenic.  She will stop her antihypertensive medications will check sitting blood pressures daily and if able to standing.  At this time I do  not think she requires an implanted loop recorder or consideration of a pacemaker.  I asked the family try to mitigate these episodes to continue support hose lightly salt to her diet use an abdominal binder and also to drink 16 ounces of water before she is out of bed in the morning to initiate a gastric pressor reflex raising systolic blood pressure.  I will see her back in the office in several weeks and if hypotension is unimproved consider initiating treatment with Northera.  She has no background history of known heart disease.  Echocardiogram performed after pulmonary embolism showed no pulmonary hypertension or significant valvular heart disease.  Daughter voices understanding of the plan and acceptance of sitting and supine hypertension try to blunted 11 8 these episodes. Past Medical History:  Diagnosis Date  . Allergic drug reaction    Opioids  . Anxiety   . 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 Arthritis    "knees" (01/29/2018)  . Back pain, thoracic   . Benign familial tremor   . Bruise   . Calf pain   . Chest pain    -- Myoview negative 2007  . Chronic pain syndrome   . Depression   . Edema   . Gallstones   . GERD (gastroesophageal reflux disease)   . Heart murmur   . High cholesterol   . History of blood transfusion    "related to a surgery"  . History of hiatal hernia   . Hypertension   . Hypokalemia   . Meniscus tear   . Myalgia   . Osteoarthritis   . Other symptoms referable to lower leg joint   . Other urinary incontinence   . Pneumonia    "once" (01/29/2018)  . PONV (postoperative nausea and vomiting)   . Scoliosis   . Skin rash   . Tinea corporis   . Urinary incontinence     Past Surgical History:  Procedure Laterality Date  . ABDOMINAL HYSTERECTOMY    . BACK SURGERY    . BREAST BIOPSY Right   . BREAST LUMPECTOMY    . BREAST LUMPECTOMY Right    "no cancer"  . DILATION AND CURETTAGE OF UTERUS    .  JOINT REPLACEMENT    . KNEE ARTHROSCOPY     Bilateral  . KNEE ARTHROSCOPY Bilateral   . LUMBAR LAMINECTOMY/DECOMPRESSION MICRODISCECTOMY  2007   L3-4,4-5, L5hemilaminectomy with leopard cage internal fiaxation L3-5  . POSTERIOR LUMBAR FUSION    . ROTATOR CUFF REPAIR     Right  . SHOULDER ARTHROSCOPY W/ ROTATOR CUFF REPAIR Right   . TONSILLECTOMY    . TOTAL KNEE ARTHROPLASTY Bilateral   . VAGINAL HYSTERECTOMY      Current Medications: Current Meds  Medication Sig  . amantadine (SYMMETREL) 100 MG capsule Take 1 capsule (100 mg total) by mouth 2 (two) times daily. (Patient taking differently: Take 100 mg by mouth daily. )  . apixaban (ELIQUIS) 5 MG TABS tablet Take 1 tablet (5 mg total) by mouth 2 (two) times daily. Take 10 mg twice daily for 1 week then take 5  mg twice daily (Patient taking differently: Take 5 mg by mouth 2 (two) times daily. )  . carbidopa-levodopa (SINEMET IR) 10-100 MG tablet Take 1 tablet by mouth 4 (four) times daily.  . carbidopa-levodopa (SINEMET IR) 25-100 MG tablet Take 3 tablets by mouth 4 (four) times daily.   . Cholecalciferol (VITAMIN D-3) 1000 units CAPS Take 1 capsule (1,000 Units total) by mouth daily.  Marland Kitchen. desoximetasone (TOPICORT) 0.05 % cream APPLY TO AFFECTED AREA TWICE A DAY (Patient taking differently: Apply 1 application topically 2 (two) times daily as needed (dermatitis). )  . doxycycline (VIBRA-TABS) 100 MG tablet Take 1 tablet (100 mg total) by mouth 2 (two) times daily.  . famotidine (PEPCID) 20 MG tablet Take 20 mg by mouth 2 (two) times daily.  . feeding supplement, ENSURE ENLIVE, (ENSURE ENLIVE) LIQD Take 237 mLs by mouth 2 (two) times daily between meals.  Marland Kitchen. FLUoxetine (PROZAC) 20 MG capsule Take 3 capsules (60 mg total) by mouth daily.  . folic acid (FOLVITE) 1 MG tablet Take 1 tablet (1 mg total) by mouth daily.  Marland Kitchen. HYDROcodone-acetaminophen (NORCO/VICODIN) 5-325 MG tablet Take 1 tablet by mouth every 6 (six) hours as needed for moderate  pain.  Marland Kitchen. lactose free nutrition (BOOST) LIQD Take 237 mLs by mouth 2 (two) times daily as needed (nutritional supplement.).  Marland Kitchen. levETIRAcetam (KEPPRA) 500 MG tablet Take 1 tablet (500 mg total) by mouth 2 (two) times daily.  Marland Kitchen. LORazepam (ATIVAN) 0.5 MG tablet 1/2-1 po tid prn  . OLANZapine (ZYPREXA) 10 MG tablet TAKE 1 TABLET BY MOUTH EVERY DAY (Patient taking differently: Take 10 mg by mouth at bedtime. )  . ondansetron (ZOFRAN-ODT) 4 MG disintegrating tablet Take 0.5 tablets (2 mg total) by mouth every 8 (eight) hours as needed for nausea or vomiting.  . pantoprazole (PROTONIX) 40 MG tablet Take 1 tablet (40 mg total) by mouth 2 (two) times daily before a meal.  . pravastatin (PRAVACHOL) 20 MG tablet TAKE 1 TABLET BY MOUTH EVERY DAY (Patient taking differently: Take 20 mg by mouth daily. )  . temazepam (RESTORIL) 7.5 MG capsule Take 1 capsule (7.5 mg total) by mouth at bedtime as needed for sleep.  . Vitamin D, Ergocalciferol, (DRISDOL) 50000 units CAPS capsule Take 1 capsule (50,000 Units total) by mouth every 7 (seven) days. (Patient taking differently: Take 50,000 Units by mouth every 7 (seven) days. Monday)  . [DISCONTINUED] amLODipine-valsartan (EXFORGE) 5-160 MG tablet Take 1 tablet by mouth daily.     Allergies:   Codeine; Gabapentin; Gabapentin; Latex; Morphine sulfate; Morphine sulfate; Penicillins; Sertraline; Sertraline hcl; and Penicillins   Social History   Socioeconomic History  . Marital status: Widowed    Spouse name: Not on file  . Number of children: 3  . Years of education: 3719  . Highest education level: Not on file  Occupational History  . Occupation: Midwiferetired--administator    Employer: [employer]  Social Needs  . Financial resource strain: Not on file  . Food insecurity:    Worry: Not on file    Inability: Not on file  . Transportation needs:    Medical: Not on file    Non-medical: Not on file  Tobacco Use  . Smoking status: Never Smoker  .  Smokeless tobacco: Never Used  Substance and Sexual Activity  . Alcohol use: Not Currently    Frequency: Never    Comment: 01/29/2018 "couple drinks/year"  . Drug use: Never  . Sexual activity: Not Currently  Partners: Male  Lifestyle  . Physical activity:    Days per week: Not on file    Minutes per session: Not on file  . Stress: Not on file  Relationships  . Social connections:    Talks on phone: Not on file    Gets together: Not on file    Attends religious service: Not on file    Active member of club or organization: Not on file    Attends meetings of clubs or organizations: Not on file    Relationship status: Not on file  Other Topics Concern  . Not on file  Social History Narrative   ** Merged History Encounter **         Family History: The patient's family history includes Parkinsonism in her mother; Stroke in her mother. ROS:  Review of Systems  Constitution: Positive for decreased appetite and malaise/fatigue.  HENT: Negative.   Eyes: Negative.   Cardiovascular: Positive for leg swelling and syncope.  Respiratory: Positive for shortness of breath and snoring.   Endocrine: Negative.   Hematologic/Lymphatic: Bruises/bleeds easily.  Skin: Negative.   Musculoskeletal: Positive for joint swelling.  Gastrointestinal: Negative.   Neurological: Positive for disturbances in coordination, light-headedness and loss of balance.  Psychiatric/Behavioral: Positive for depression.  Allergic/Immunologic: Negative.    Please see the history of present illness.    All other systems reviewed and are negative.  EKGs/Labs/Other Studies Reviewed:    The following studies were reviewed today:  EKG:  EKG ordered 03/10/2018 independently reviewed sinus rhythm nonspecific ST abnormality Recent Labs: 01/27/2018: B Natriuretic Peptide 28.0 01/29/2018: Magnesium 1.7 03/06/2018: ALT 3; TSH 0.70 03/10/2018: BUN 20; Creatinine, Ser 1.24; Hemoglobin 9.9; Platelets 422; Potassium  2.9; Sodium 141  Recent Lipid Panel    Component Value Date/Time   CHOL 130 02/28/2017 1131   TRIG 71.0 02/28/2017 1131   HDL 44.20 02/28/2017 1131   CHOLHDL 3 02/28/2017 1131   VLDL 14.2 02/28/2017 1131   LDLCALC 71 02/28/2017 1131   LDLDIRECT 155.7 09/17/2012 1139    Physical Exam:    VS:  BP (!) 98/58 (BP Location: Right Arm, Patient Position: Standing, Cuff Size: Normal)   Pulse 70   Ht 5\' 6"  (1.676 m)   Wt 254 lb (115.2 kg)   SpO2 97%   BMI 41.00 kg/m     Wt Readings from Last 3 Encounters:  03/13/18 254 lb (115.2 kg)  02/22/18 260 lb (117.9 kg)  01/28/18 255 lb 11.7 oz (116 kg)     GEN: Shows very chronically ill debilitated week she is very apathetic and almost somnolent during the visit well nourished in no acute distress HEENT: Normal NECK: No JVD; No carotid bruits LYMPHATICS: No lymphadenopathy CARDIAC: RRR, no murmurs, rubs, gallops RESPIRATORY:  Clear to auscultation without rales, wheezing or rhonchi  ABDOMEN: Soft, non-tender, non-distended MUSCULOSKELETAL:  No edema; No deformity  SKIN: Warm and dry NEUROLOGIC:  Alert and oriented x 3 PSYCHIATRIC:  Normal affect    Signed, Norman HerrlichBrian Zalmen Wrightsman, MD  03/13/2018 6:18 PM    [facility]

## 2018-03-12 NOTE — Telephone Encounter (Signed)
Likely needs appt with Dr. Laury AxonLowne for this. TY.

## 2018-03-12 NOTE — Telephone Encounter (Deleted)
Spoke with daughter medication for prozac 20mg  sent in.  Advised that I will try and call back the home health today since I have not heard back from them for more help.

## 2018-03-12 NOTE — Telephone Encounter (Signed)
Spoke with daughter.  She states that she uses a transportation service that charges $130 for where she needs to go [facility] and to see another provider to go over all her history again.   Is there anyway that we can treat her, since she was seen in the ER and look at the notes.    Awaiting on a call back from home health about what we need to do about getting more help.

## 2018-03-13 ENCOUNTER — Other Ambulatory Visit: Payer: Self-pay | Admitting: Family Medicine

## 2018-03-13 ENCOUNTER — Encounter

## 2018-03-13 ENCOUNTER — Ambulatory Visit (INDEPENDENT_AMBULATORY_CARE_PROVIDER_SITE_OTHER): Payer: Medicare Other | Admitting: Cardiology

## 2018-03-13 ENCOUNTER — Encounter: Payer: Self-pay | Admitting: Cardiology

## 2018-03-13 ENCOUNTER — Ambulatory Visit (HOSPITAL_BASED_OUTPATIENT_CLINIC_OR_DEPARTMENT_OTHER)
Admission: RE | Admit: 2018-03-13 | Discharge: 2018-03-13 | Disposition: A | Discharge status: Home / Self Care | Payer: Medicare Other | Source: Ambulatory Visit | Attending: Family Medicine | Admitting: Family Medicine

## 2018-03-13 VITALS — BP 98/58 | HR 70 | Ht 66.0 in | Wt 254.0 lb

## 2018-03-13 DIAGNOSIS — G20 Parkinson's disease: Secondary | ICD-10-CM

## 2018-03-13 DIAGNOSIS — G903 Multi-system degeneration of the autonomic nervous system: Secondary | ICD-10-CM | POA: Diagnosis not present

## 2018-03-13 DIAGNOSIS — M25571 Pain in right ankle and joints of right foot: Secondary | ICD-10-CM | POA: Insufficient documentation

## 2018-03-13 DIAGNOSIS — I10 Essential (primary) hypertension: Secondary | ICD-10-CM | POA: Diagnosis not present

## 2018-03-13 DIAGNOSIS — R55 Syncope and collapse: Secondary | ICD-10-CM

## 2018-03-13 DIAGNOSIS — F410 Panic disorder [episodic paroxysmal anxiety] without agoraphobia: Secondary | ICD-10-CM

## 2018-03-13 DIAGNOSIS — I959 Hypotension, unspecified: Secondary | ICD-10-CM | POA: Insufficient documentation

## 2018-03-13 MED ORDER — LORAZEPAM 0.5 MG PO TABS: ORAL_TABLET | ORAL | 1 refills | Status: DC

## 2018-03-13 MED ORDER — FLUOXETINE HCL 20 MG PO CAPS: 60.0000 mg | ORAL_CAPSULE | Freq: Every day | ORAL | 0 refills | Status: DC

## 2018-03-13 NOTE — Telephone Encounter (Signed)
Spoke with daughter about results.  She had questions about the blood count that polychrmasia is present and also should she worry about the potassium level.  Also when should they return for recheck on blood.  She will be by to pickup guiac cards today.  Placed up at front desk for her.

## 2018-03-13 NOTE — Telephone Encounter (Signed)
For anemia-- we need to check guaic cards-- we can send them to the house  Take mvi with iron daily-- she will need to take stool softener otc as well

## 2018-03-13 NOTE — Telephone Encounter (Signed)
She actually is on prozac which tx anxiety-- can increase to prozac 20 mg 3 po qd #90   Can give ativan 0.5 mg 1/2 po tid prn #20 --- only to be taken as needed not regularly  ER also recommended long term care facility.  I had previously mentioned this to pt daughter but they have refused placement .

## 2018-03-13 NOTE — Patient Instructions (Signed)
Medication Instructions:  Your physician has recommended you make the following change in your medication:   STOP amlodipine-valsartan  If you need a refill on your cardiac medications before your next appointment, please call your pharmacy.   Lab work: None  If you have labs (blood work) drawn today and your tests are completely normal, you will receive your results only by: Marland Kitchen. MyChart Message (if you have MyChart) OR . A paper copy in the mail If you have any lab test that is abnormal or we need to change your treatment, we will call you to review the results.  Testing/Procedures: None  Follow-Up: At [facility], you and your health needs are our priority.  As part of our continuing mission to provide you with exceptional heart care, we have created designated Provider Care Teams.  These Care Teams include your primary Cardiologist (physician) and Advanced Practice Providers (APPs -  Physician Assistants and Nurse Practitioners) who all work together to provide you with the care you need, when you need it. You will need a follow up appointment in 3 weeks.     **Continue wearing compression stockings!   **Purchase an abdominal binder and wear this daily with removing only at bedtime!   **Check blood pressure daily at the same time every day and keep a log of these readings. Bring this log to the next appointment for Dr. Dulce SellarMunley to review.   **Lightly salt food!  **Drink 16 oz (1 liter) of water every morning before getting out of bed.

## 2018-03-16 ENCOUNTER — Other Ambulatory Visit: Payer: Self-pay | Admitting: *Deleted

## 2018-03-16 ENCOUNTER — Telehealth: Payer: Self-pay | Admitting: *Deleted

## 2018-03-16 DIAGNOSIS — D649 Anemia, unspecified: Secondary | ICD-10-CM

## 2018-03-16 NOTE — Telephone Encounter (Signed)
Message from last week  Spoke with daughter medication for prozac 20mg  sent in.  Advised that I will try and call back the home health today since I have not heard back from them for more help.

## 2018-03-17 MED ORDER — POTASSIUM CHLORIDE CRYS ER 20 MEQ PO TBCR: EXTENDED_RELEASE_TABLET | ORAL | 0 refills | Status: DC

## 2018-03-17 NOTE — Addendum Note (Signed)
Addended by: Sandford Craze'SULLIVAN, Treasure Ochs on: 03/17/2018 12:00 PM   Modules accepted: Orders

## 2018-03-17 NOTE — Telephone Encounter (Signed)
Also, polychromasia could be related to her anemia. Recommend repeat at follow up with PCP.

## 2018-03-17 NOTE — Telephone Encounter (Signed)
It does not look like they gave her any potassium in the ER. I would recommend that she take kdur 40meQ PO x 1 and that she schedule follow up in the office with Dr. Laury AxonLowne in the next 1 week.

## 2018-03-19 NOTE — Telephone Encounter (Signed)
Can home health check bmp for [place] ?   If not  Take kcl 10 meq 1 po qd until ov #30   1 refill

## 2018-03-19 NOTE — Telephone Encounter (Signed)
For the potassium supplement is that for just one dose?  Patient does have an appointment coming up on 05/12/18, can we wait to recheck then?

## 2018-03-20 MED ORDER — POTASSIUM CHLORIDE ER 10 MEQ PO TBCR: 10.0000 meq | EXTENDED_RELEASE_TABLET | Freq: Every day | ORAL | 1 refills | Status: DC

## 2018-03-20 NOTE — Telephone Encounter (Signed)
Daughter will do the once a day until ov in February.  rx sent in.

## 2018-03-20 NOTE — Addendum Note (Signed)
Addended by: Thelma Barge D on: 03/20/2018 11:30 AM   Modules accepted: Orders

## 2018-03-24 ENCOUNTER — Other Ambulatory Visit: Payer: Self-pay | Admitting: Family Medicine

## 2018-03-26 ENCOUNTER — Telehealth: Payer: Self-pay | Admitting: *Deleted

## 2018-03-26 NOTE — Telephone Encounter (Signed)
Copied from CRM [id number]. Topic: General - Other >> Mar 25, 2018  4:58 PM Jilda Roche wrote: Reason for CRM: Belinda Fisher patients daughter called wanting to speak to [facility] about a wheel chair  Best call back is [phone]

## 2018-03-27 ENCOUNTER — Telehealth: Payer: Self-pay | Admitting: *Deleted

## 2018-03-27 NOTE — Telephone Encounter (Signed)
Spoke with daughter and they said that they never heard anything from [facility] about electric wheelchair.  Message sent to [facility] to check status.

## 2018-03-27 NOTE — Telephone Encounter (Signed)
Received Physician Orders from [facility]; forwarded to provider/SLS 01/10

## 2018-03-30 ENCOUNTER — Telehealth: Payer: Self-pay | Admitting: *Deleted

## 2018-03-30 NOTE — Telephone Encounter (Signed)
Spoke with daughter Friday 03/27/18 and she stated that pt and ot is ending one day this week and that they would like to continue service also with ADLs.    Called home health and left message to see if they would be able to continure services or to see if we need to send another order.  The lady I spoke to before they transferred stated that patient may have met goals to be discharged.

## 2018-04-01 ENCOUNTER — Telehealth: Payer: Self-pay | Admitting: *Deleted

## 2018-04-01 NOTE — Telephone Encounter (Signed)
Received Physician Orders from [facility] for evaluation for new wheelchair; forwarded to provider/SLS 01/15

## 2018-04-01 NOTE — Telephone Encounter (Signed)
[name] resent orders stating that the provider signature didn't show up; forwarded to provider/SLS 01/15

## 2018-04-03 ENCOUNTER — Telehealth: Payer: Self-pay | Admitting: *Deleted

## 2018-04-03 ENCOUNTER — Other Ambulatory Visit: Payer: Self-pay | Admitting: Family Medicine

## 2018-04-03 DIAGNOSIS — G20A1 Parkinson's disease without dyskinesia, without mention of fluctuations: Secondary | ICD-10-CM

## 2018-04-03 DIAGNOSIS — G20 Parkinson's disease: Secondary | ICD-10-CM

## 2018-04-03 MED ORDER — NONFORMULARY OR COMPOUNDED ITEM: 1 refills | Status: AC

## 2018-04-03 NOTE — Telephone Encounter (Signed)
Order printed

## 2018-04-03 NOTE — Telephone Encounter (Signed)
See other phone message 04/03/18

## 2018-04-03 NOTE — Telephone Encounter (Signed)
Tresa Endo from [facility] called and stated that pt is going to be d/c from [facility] probably this afternoon and the daughter is ery weary about this. She would like her mother to have Hospice care, if appropriate. Caller stated that if provider wants to, they can fax an order for Evaluate & Admit to Hospice, if appropriate [for Parkinson's and other diagnosis deemed necessary] to them at [phone]; her direct call back is [phone]. Please Advise/SLS 01/17

## 2018-04-03 NOTE — Telephone Encounter (Signed)
Did you get this back from lowne yet?

## 2018-04-03 NOTE — Telephone Encounter (Signed)
Order received from provider; faxed to Hospice, [facility]/SLS 01/17

## 2018-04-06 NOTE — Telephone Encounter (Signed)
Yes, faxed with confirmation on Friday/SLS 01/20

## 2018-04-07 ENCOUNTER — Other Ambulatory Visit: Payer: Self-pay | Admitting: Family Medicine

## 2018-04-07 DIAGNOSIS — G4709 Other insomnia: Secondary | ICD-10-CM

## 2018-04-09 NOTE — Progress Notes (Signed)
Cardiology Office Note:    Date:  04/10/2018   ID:  [name] GivenJoan M Speedy, DOB 12/06/1933, MRN [id number]  PCP:  Donato SchultzLowne Chase, Yvonne R, DO  Cardiologist:  Norman HerrlichBrian Shamel Galyean, MD    Referring MD: Zola ButtonLowne Chase, Grayling CongressYvonne R, *    ASSESSMENT:    1. Syncope and collapse   2. Symptomatic Parkinson disease (HCC)    PLAN:    In order of problems listed above:  1. Improved since we withdrew antihypertensive medication she has had no recurrent episodes of faint seizure collapse or hypotension with home blood pressures running 110-130 systolic.  With her advanced Parkinson's disease and autonomic failure I would not give her antihypertensive agents regardless of the blood pressure and if she has recurrent episodes with hypotension as the daughter contact me not start her on treatment with Northera 2. Quite advanced disease family's goals are palliative she is in hospice receiving good care at home and I supported them in their decision.  Typically recurrent episodes of profound syncope and hypotension are marker of late stage and advanced disease.  I am hopeful we can palliate her by withdrawing antihypertensive agents and if necessary instituting treatment if she has recurrent episodes   Next appointment: As needed her daughter is high healthcare literacy and will contact me if we need to institute additional treatment.  I also asked her to speak to the neurologist I do not think her mother has a seizure disorder perhaps they could withdraw the medications   Medication Adjustments/Labs and Tests Ordered: Current medicines are reviewed at length with the patient today.  Concerns regarding medicines are outlined above.  No orders of the defined types were placed in this encounter.  No orders of the defined types were placed in this encounter.   Chief Complaint  Patient presents with  . Hypotension    with parkinson's Disease    History of Present Illness:    [name] GivenJoan M [name] is a 85 y.o. female with a hx  of Parkinson's disease with autonomic failure and hypotension, chronic anticoagulation with previous pulmonary embolism and recent episodes of seizure-like activity has been seen by neurology and placed on seizure medication. She was last seen 03/13/18. Compliance with diet, lifestyle and medications: Yes  She is comfortable somewhat more alert but truly is dependent on her family is present in a wheelchair with her progressive disease that her goals are comfort and she is now on hospice.  Withdrawing her antihypertensive medication is quite helpful no recurrent prolapse syncope seizure or hypotension and blood pressures run in the range of 1 10-1 30 sitting.  Family is comfortable that we will not treat hypertension and if necessary we can use additional agents for hypotension and syncope with Parkinson's disease for palliation.  Her mother has voiced no chest pain shortness of breath palpitation or syncope Past Medical History:  Diagnosis Date  . Allergic drug reaction    Opioids  . Anxiety   . 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 Arthritis    "knees" (01/29/2018)  . Back pain, thoracic   . Benign familial tremor   . Bruise   . Calf pain   . Chest pain    -- Myoview negative 2007  . Chronic pain syndrome   . Depression   . Edema   . Gallstones   . GERD (gastroesophageal reflux disease)   . Heart murmur   . High cholesterol   . History of blood  transfusion    "related to a surgery"  . History of hiatal hernia   . Hypertension   . Hypokalemia   . Meniscus tear   . Myalgia   . Osteoarthritis   . Other symptoms referable to lower leg joint   . Other urinary incontinence   . Pneumonia    "once" (01/29/2018)  . PONV (postoperative nausea and vomiting)   . Scoliosis   . Skin rash   . Tinea corporis   . Urinary incontinence     Past Surgical History:  Procedure Laterality Date  . ABDOMINAL HYSTERECTOMY    . BACK SURGERY    .  BREAST BIOPSY Right   . BREAST LUMPECTOMY    . BREAST LUMPECTOMY Right    "no cancer"  . DILATION AND CURETTAGE OF UTERUS    . JOINT REPLACEMENT    . KNEE ARTHROSCOPY     Bilateral  . KNEE ARTHROSCOPY Bilateral   . LUMBAR LAMINECTOMY/DECOMPRESSION MICRODISCECTOMY  2007   L3-4,4-5, L5hemilaminectomy with leopard cage internal fiaxation L3-5  . POSTERIOR LUMBAR FUSION    . ROTATOR CUFF REPAIR     Right  . SHOULDER ARTHROSCOPY W/ ROTATOR CUFF REPAIR Right   . TONSILLECTOMY    . TOTAL KNEE ARTHROPLASTY Bilateral   . VAGINAL HYSTERECTOMY      Current Medications: Current Meds  Medication Sig  . apixaban (ELIQUIS) 5 MG TABS tablet Take 1 tablet (5 mg total) by mouth 2 (two) times daily. Take 10 mg twice daily for 1 week then take 5 mg twice daily (Patient taking differently: Take 5 mg by mouth 2 (two) times daily. )  . carbidopa-levodopa (PARCOPA) 10-100 MG disintegrating tablet Take 2 tablets by mouth 4 (four) times daily.  . Cholecalciferol (VITAMIN D-3) 1000 units CAPS Take 1 capsule (1,000 Units total) by mouth daily.  Marland Kitchen. desoximetasone (TOPICORT) 0.05 % cream APPLY TO AFFECTED AREA TWICE A DAY (Patient taking differently: Apply 1 application topically 2 (two) times daily as needed (dermatitis). )  . famotidine (PEPCID) 20 MG tablet Take 20 mg by mouth 2 (two) times daily.  . feeding supplement, ENSURE ENLIVE, (ENSURE ENLIVE) LIQD Take 237 mLs by mouth 2 (two) times daily between meals.  Marland Kitchen. FLUoxetine (PROZAC) 20 MG capsule Take 3 capsules (60 mg total) by mouth daily.  . folic acid (FOLVITE) 1 MG tablet Take 1 tablet (1 mg total) by mouth daily.  Marland Kitchen. HYDROcodone-acetaminophen (NORCO/VICODIN) 5-325 MG tablet Take 1 tablet by mouth every 6 (six) hours as needed for moderate pain.  Marland Kitchen. lactose free nutrition (BOOST) LIQD Take 237 mLs by mouth 2 (two) times daily as needed (nutritional supplement.).  Marland Kitchen. levETIRAcetam (KEPPRA) 500 MG tablet Take 1 tablet (500 mg total) by mouth 2 (two) times  daily.  Marland Kitchen. LORazepam (ATIVAN) 0.5 MG tablet 1/2-1 po tid prn  . NONFORMULARY OR COMPOUNDED ITEM Evaluate and admit to hospice if appropriate  Dx parkinson disease  . ondansetron (ZOFRAN-ODT) 4 MG disintegrating tablet Take 0.5 tablets (2 mg total) by mouth every 8 (eight) hours as needed for nausea or vomiting.  . pantoprazole (PROTONIX) 40 MG tablet Take 1 tablet (40 mg total) by mouth 2 (two) times daily before a meal.  . temazepam (RESTORIL) 7.5 MG capsule TAKE 1 CAPSULE(7.5 MG) BY MOUTH AT BEDTIME AS NEEDED FOR SLEEP  . Vitamin D, Ergocalciferol, (DRISDOL) 50000 units CAPS capsule Take 1 capsule (50,000 Units total) by mouth every 7 (seven) days. (Patient taking differently: Take 50,000 Units by mouth every  7 (seven) days. Monday)     Allergies:   Codeine; Gabapentin; Gabapentin; Latex; Morphine sulfate; Morphine sulfate; Penicillins; Sertraline; Sertraline hcl; and Penicillins   Social History   Socioeconomic History  . Marital status: Widowed    Spouse name: Not on file  . Number of children: 3  . Years of education: 80  . Highest education level: Not on file  Occupational History  . Occupation: Midwife: [employer]  Social Needs  . Financial resource strain: Not on file  . Food insecurity:    Worry: Not on file    Inability: Not on file  . Transportation needs:    Medical: Not on file    Non-medical: Not on file  Tobacco Use  . Smoking status: Never Smoker  . Smokeless tobacco: Never Used  Substance and Sexual Activity  . Alcohol use: Not Currently    Frequency: Never    Comment: 01/29/2018 "couple drinks/year"  . Drug use: Never  . Sexual activity: Not Currently    Partners: Male  Lifestyle  . Physical activity:    Days per week: Not on file    Minutes per session: Not on file  . Stress: Not on file  Relationships  . Social connections:    Talks on phone: Not on file    Gets together: Not on file    Attends religious  service: Not on file    Active member of club or organization: Not on file    Attends meetings of clubs or organizations: Not on file    Relationship status: Not on file  Other Topics Concern  . Not on file  Social History Narrative   ** Merged History Encounter **         Family History: The patient's family history includes Parkinsonism in her mother; Stroke in her mother. ROS:   Please see the history of present illness.    All other systems reviewed and are negative.  EKGs/Labs/Other Studies Reviewed:    The following studies were reviewed today:    Recent Labs: 01/27/2018: B Natriuretic Peptide 28.0 01/29/2018: Magnesium 1.7 03/06/2018: ALT 3; TSH 0.70 03/10/2018: BUN 20; Creatinine, Ser 1.24; Hemoglobin 9.9; Platelets 422; Potassium 2.9; Sodium 141  Recent Lipid Panel    Component Value Date/Time   CHOL 130 02/28/2017 1131   TRIG 71.0 02/28/2017 1131   HDL 44.20 02/28/2017 1131   CHOLHDL 3 02/28/2017 1131   VLDL 14.2 02/28/2017 1131   LDLCALC 71 02/28/2017 1131   LDLDIRECT 155.7 09/17/2012 1139    Physical Exam:    VS:  BP 96/74 (BP Location: Right Wrist, Patient Position: Sitting, Cuff Size: Normal)   Pulse 100   Ht 5\' 6"  (1.676 m)   Wt 254 lb (115.2 kg)   SpO2 93%   BMI 41.00 kg/m     Wt Readings from Last 3 Encounters:  04/10/18 254 lb (115.2 kg)  03/13/18 254 lb (115.2 kg)  02/22/18 260 lb (117.9 kg)     GEN: Very chronically ill apathetic looks debilitated in no acute distress HEENT: Normal NECK: No JVD; No carotid bruits LYMPHATICS: No lymphadenopathy CARDIAC: RRR, no murmurs, rubs, gallops RESPIRATORY:  Clear to auscultation without rales, wheezing or rhonchi  ABDOMEN: Soft, non-tender, non-distended MUSCULOSKELETAL:  No edema; No deformity  SKIN: Warm and dry NEUROLOGIC:  Alert and oriented x 3 PSYCHIATRIC:  Normal affect    Signed, Norman Herrlich, MD  04/10/2018 6:14 PM    [facility]

## 2018-04-09 NOTE — Telephone Encounter (Signed)
Database ran and is on your desk for review.  Last filled per database: 02/13/18 Last written: 02/08/18 Last ov: 03/06/18 Next ov: 05/12/18 Contract: will get at next ov UDS: will get at next ov

## 2018-04-10 ENCOUNTER — Ambulatory Visit (INDEPENDENT_AMBULATORY_CARE_PROVIDER_SITE_OTHER): Payer: Medicare Other | Admitting: Cardiology

## 2018-04-10 ENCOUNTER — Encounter: Payer: Self-pay | Admitting: Cardiology

## 2018-04-10 VITALS — BP 96/74 | HR 100 | Ht 66.0 in | Wt 254.0 lb

## 2018-04-10 DIAGNOSIS — G20 Parkinson's disease: Secondary | ICD-10-CM | POA: Diagnosis not present

## 2018-04-10 DIAGNOSIS — R55 Syncope and collapse: Secondary | ICD-10-CM | POA: Diagnosis not present

## 2018-04-10 DIAGNOSIS — G20A1 Parkinson's disease without dyskinesia, without mention of fluctuations: Secondary | ICD-10-CM

## 2018-04-10 NOTE — Patient Instructions (Addendum)
Medication Instructions:  Your physician recommends that you continue on your current medications as directed. Please refer to the Current Medication list given to you today.  If you need a refill on your cardiac medications before your next appointment, please call your pharmacy.   Lab work: NONE If you have labs (blood work) drawn today and your tests are completely normal, you will receive your results only by: Marland Kitchen. MyChart Message (if you have MyChart) OR . A paper copy in the mail If you have any lab test that is abnormal or we need to change your treatment, we will call you to review the results.  Testing/Procedures: NONE  Follow-Up: At [facility], you and your health needs are our priority.  As part of our continuing mission to provide you with exceptional heart care, we have created designated Provider Care Teams.  These Care Teams include your primary Cardiologist (physician) and Advanced Practice Providers (APPs -  Physician Assistants and Nurse Practitioners) who all work together to provide you with the care you need, when you need it. You will need a follow up appointment if symptoms worsen or fail to improve.    Parkinson Disease Parkinson disease is a long-term (chronic) condition. It gets worse over time (is progressive). Parkinson disease limits your ability:  To control how your body moves.  To move your body normally. This condition affects each person differently. The condition can range from mild to very bad. This condition tends to progress slowly over many years. Follow these instructions at home:  Take over-the-counter and prescription medicines only as told by your doctor.  Put grab bars and railings in your home. These help to prevent falls.  Follow instructions from your doctor about what you can or cannot eat or drink.  Go back to your normal activities as told by your doctor. Ask your doctor what activities are safe for you.  Exercise as told by your  doctor or physical therapist.  Keep all follow-up visits as told by your doctor. This is important. These include any visits with a speech or occupational therapist.  Think about joining a support group for people who have Parkinson disease. Contact a doctor if:  Medicines do not help your symptoms.  You feel off-balance.  You fall at home.  You need more help at home.  You have: ? Trouble swallowing. ? A very hard time pooping (constipation). ? A lot of side effects from your medicines.  You see or hear things that are not real (hallucinate).  You feel: ? Confused. ? Anxious. ? Sad (depressed). Get help right away if:  You were hurt in a fall.  You cannot swallow without choking.  You have chest pain.  You have trouble breathing.  You do not feel safe at home. This information is not intended to replace advice given to you by your health care provider. Make sure you discuss any questions you have with your health care provider. Document Released: 05/27/2011 Document Revised: 08/10/2015 Document Reviewed: 12/23/2014 Elsevier Interactive Patient Education  2019 [employer]

## 2018-04-13 ENCOUNTER — Other Ambulatory Visit: Payer: Self-pay | Admitting: Family Medicine

## 2018-04-13 NOTE — Assessment & Plan Note (Addendum)
I am referring the pt for PT for mobility eval for power wheelchair

## 2018-04-23 ENCOUNTER — Other Ambulatory Visit (INDEPENDENT_AMBULATORY_CARE_PROVIDER_SITE_OTHER): Payer: Self-pay | Admitting: Specialist

## 2018-04-23 NOTE — Telephone Encounter (Signed)
Diclofenac refill request 

## 2018-04-24 ENCOUNTER — Ambulatory Visit: Payer: Self-pay

## 2018-04-24 ENCOUNTER — Other Ambulatory Visit: Payer: Self-pay | Admitting: Family Medicine

## 2018-04-24 ENCOUNTER — Telehealth: Payer: Self-pay | Admitting: Family Medicine

## 2018-04-24 DIAGNOSIS — G8929 Other chronic pain: Secondary | ICD-10-CM

## 2018-04-24 NOTE — Telephone Encounter (Signed)
I called and spoke to the daughter and she is not at the home currently. She will call back shortly with those other BP checks. She did state that her cardiologist took her off her BP med in Jan. Also the CNA at the home thought the BP being elevated was due to pain and then caused the HA

## 2018-04-24 NOTE — Telephone Encounter (Signed)
Dr Carmelia Roller -- Please review in PCP's absence.

## 2018-04-24 NOTE — Telephone Encounter (Signed)
The CNA states she has been complaining of headache's for the last few visits. The headache's usual stop with tylenol or hydrocodone. But she with tylenol HA subsided some and BP dropped to 179/77 and pulse of 112. She states the patient gets very agitated when having HA and pulse rate usually goes up.

## 2018-04-24 NOTE — Telephone Encounter (Signed)
What was the blood pressure on recheck? Did she call back? I'm OK treating BP if elevation is causing HA, only if she needs it though. TY.

## 2018-04-24 NOTE — Telephone Encounter (Signed)
OK, sounds like an acute reaction to pain/HA. Let's continue treating pain and hold off on adding back medications. TY.

## 2018-04-24 NOTE — Telephone Encounter (Signed)
Copied from CRM [id number]. Topic: Quick Communication - See Telephone Encounter >> Apr 24, 2018  2:20 PM Jay Schlichter wrote: CRM for notification. See Telephone encounter for: 04/24/18. Faith with [name] called - pt bp is 179/77.  She was not with pt at the time. She is asking for order and rx to be sent to [facility] on [address] to be sent in.  Faith would like to have call back Please call [phone]

## 2018-04-24 NOTE — Telephone Encounter (Signed)
Copied from CRM [id number]. Topic: Quick Communication - Rx Refill/Question >> Apr 24, 2018  1:03 PM Angela Nevin wrote: Medication: HYDROcodone-acetaminophen (NORCO/VICODIN) 5-325 MG tablet   Faith, RN, with [name] calling on behalf of patient to get a refill of this medication.   Preferred Pharmacy (with phone number or street name):[facility] [phone] - [place], [place] - [address] AT [facility] [address] & [address] [phone] (Phone) [phone] (Fax)

## 2018-04-24 NOTE — Telephone Encounter (Signed)
That's important to know. If BP is elevated bc of pain, no BP meds. If elevation is causing HA, and this would be based on her history with this, then BP meds could be used. Sounds like the former situation is accurate. Taking someone off of BP meds is the absolute right choice if entering Hospice/comfort care and I would only institute a short term plan if we thought the BP elevation was causing HA.

## 2018-04-24 NOTE — Telephone Encounter (Signed)
Please see other phone note regarding this message Have called both RN and daughter left message to call back.

## 2018-04-24 NOTE — Telephone Encounter (Signed)
Called RN no answer/and voice mailbox was full. Called the daughter's number no answer//left message to call back. See phone note regarding original message.

## 2018-04-24 NOTE — Telephone Encounter (Signed)
[name], CNA with [facility] called to report the patient has an elevated BP checked at 0930 of 178/110 and the patient has a headache. [name] BoomCheyenne says the patient is on Hospice, but she's alert and oriented. She says the patient's daughter called and spoke to the Hospice nurse who told the daughter to give the patient pain medicine for the headache and her BP should come down. I advised [name] to let the daughter know to recheck the BP in about 2 hours after the pain medication and if the BP is still high with the diastolic above 90 to call us back. I advised I will send to [name] for her recommendation. [name] asked should she continue her care of the patient and get her up, I advised her to call the Hospice Nurse for that recommendation, she verbalized understanding.   Reason for Disposition . [1] Systolic BP  >= 130 OR Diastolic >= 80 AND [2] not taking BP medications  Answer Assessment - Initial Assessment Questions 1. BLOOD PRESSURE: "What is the blood pressure?" "Did you take at least two measurements 5 minutes apart?"     178/110 2. ONSET: "When did you take your blood pressure?"     0930 3. HOW: "How did you obtain the blood pressure?" (e.g., visiting nurse, automatic home BP monitor)    Visiting CNA 4. HISTORY: "Do you have a history of high blood pressure?"     Unknown 5. MEDICATIONS: "Are you taking any medications for blood pressure?" "Have you missed any doses recently?"     No 6. OTHER SYMPTOMS: "Do you have any symptoms?" (e.g., headache, chest pain, blurred vision, difficulty breathing, weakness)     Headache 7. PREGNANCY: "Is there any chance you are pregnant?" "When was your last menstrual period?"     N/A  Protocols used: HIGH BLOOD PRESSURE-A-AH

## 2018-04-27 NOTE — Telephone Encounter (Signed)
  Last written: 03/06/18 Last ov: 03/06/18 Next ov: 05/12/18 Contract: will get at 05/12/18 visit UDS: will get at 05/12/18 visit

## 2018-04-28 MED ORDER — HYDROCODONE-ACETAMINOPHEN 5-325 MG PO TABS: 1.0000 | ORAL_TABLET | Freq: Four times a day (QID) | ORAL | 0 refills | Status: DC | PRN

## 2018-05-07 ENCOUNTER — Telehealth: Payer: Self-pay

## 2018-05-07 NOTE — Telephone Encounter (Signed)
PA initiated via Covermymeds; KEY: [id number]. PA not needed as this is a covered drug under plan.   This medication or product is on your plan's list of covered drugs. Prior authorization is not required at this time. If your pharmacy has questions regarding the processing of your prescription, please have them call the [facility] pharmacy help desk at [phone]. **Please note: Formulary lowering, tiering exception, cost reduction and/or pre-benefit determination review (including prospective Medicare hospice reviews) requests cannot be requested using this method of submission. Please contact us at [phone] instead

## 2018-05-08 ENCOUNTER — Emergency Department (HOSPITAL_COMMUNITY): Payer: Medicare Other

## 2018-05-08 ENCOUNTER — Encounter (HOSPITAL_COMMUNITY): Payer: Self-pay

## 2018-05-08 ENCOUNTER — Other Ambulatory Visit: Payer: Self-pay

## 2018-05-08 ENCOUNTER — Emergency Department (HOSPITAL_COMMUNITY)
Admission: EM | Admit: 2018-05-08 | Discharge: 2018-05-08 | Disposition: A | Discharge status: Home / Self Care | Payer: Medicare Other | Attending: Emergency Medicine | Admitting: Emergency Medicine

## 2018-05-08 DIAGNOSIS — Y929 Unspecified place or not applicable: Secondary | ICD-10-CM | POA: Insufficient documentation

## 2018-05-08 DIAGNOSIS — Z91040 Latex allergy status: Secondary | ICD-10-CM | POA: Insufficient documentation

## 2018-05-08 DIAGNOSIS — Z96653 Presence of artificial knee joint, bilateral: Secondary | ICD-10-CM | POA: Insufficient documentation

## 2018-05-08 DIAGNOSIS — W050XXA Fall from non-moving wheelchair, initial encounter: Secondary | ICD-10-CM | POA: Insufficient documentation

## 2018-05-08 DIAGNOSIS — Y999 Unspecified external cause status: Secondary | ICD-10-CM | POA: Insufficient documentation

## 2018-05-08 DIAGNOSIS — G20 Parkinson's disease: Secondary | ICD-10-CM | POA: Diagnosis not present

## 2018-05-08 DIAGNOSIS — Y939 Activity, unspecified: Secondary | ICD-10-CM | POA: Diagnosis not present

## 2018-05-08 DIAGNOSIS — Z79899 Other long term (current) drug therapy: Secondary | ICD-10-CM | POA: Insufficient documentation

## 2018-05-08 DIAGNOSIS — S79911A Unspecified injury of right hip, initial encounter: Secondary | ICD-10-CM | POA: Diagnosis present

## 2018-05-08 DIAGNOSIS — I10 Essential (primary) hypertension: Secondary | ICD-10-CM | POA: Diagnosis not present

## 2018-05-08 DIAGNOSIS — S7001XA Contusion of right hip, initial encounter: Secondary | ICD-10-CM | POA: Diagnosis not present

## 2018-05-08 MED ORDER — HYDROCODONE-ACETAMINOPHEN 5-325 MG PO TABS
1.0000 | ORAL_TABLET | Freq: Once | ORAL | Status: AC
  Administered 2018-05-08: 1 via ORAL
  Filled 2018-05-08: qty 1

## 2018-05-08 NOTE — ED Notes (Signed)
Bed: WA20 Expected date:  Expected time:  Means of arrival:  Comments: EMS-hip pain/no deformity

## 2018-05-08 NOTE — ED Provider Notes (Signed)
[name]-EMERGENCY DEPT Provider Note   CSN: [id number] Arrival date & time: 05/08/18  1853    History   Chief Complaint Chief Complaint  Patient presents with  . Fall    Fall from wheel chair    HPI [name] is a 85 y.o. female.     Patient fell off of her wheelchair several hours ago landing on her right hip.  Has pain in the right hip.  Did not lose consciousness.  No headaches.  No abdominal pain.  The history is provided by the patient.  Fall  This is a new problem. The current episode started 3 to 5 hours ago. The problem has not changed since onset.Pertinent negatives include no chest pain, no abdominal pain, no headaches and no shortness of breath. Nothing aggravates the symptoms. Nothing relieves the symptoms. She has tried nothing for the symptoms. The treatment provided no relief.    Past Medical History:  Diagnosis Date  . Allergic drug reaction    Opioids  . Anxiety   . 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 Arthritis    "knees" (01/29/2018)  . Back pain, thoracic   . Benign familial tremor   . Bruise   . Calf pain   . Chest pain    -- Myoview negative 2007  . Chronic pain syndrome   . Depression   . Edema   . Gallstones   . GERD (gastroesophageal reflux disease)   . Heart murmur   . High cholesterol   . History of blood transfusion    "related to a surgery"  . History of hiatal hernia   . Hypertension   . Hypokalemia   . Meniscus tear   . Myalgia   . Osteoarthritis   . Other symptoms referable to lower leg joint   . Other urinary incontinence   . Pneumonia    "once" (01/29/2018)  . PONV (postoperative nausea and vomiting)   . Scoliosis   . Skin rash   . Tinea corporis   . Urinary incontinence     Patient Active Problem List   Diagnosis Date Noted  . Hypotension arterial 03/13/2018  . History of community acquired pneumonia 02/08/2018  . Pulmonary embolism  and infarction (HCC) 02/08/2018  . Syncope, vasovagal 01/28/2018  . Parkinson disease (HCC) 01/28/2018  . Essential hypertension 01/28/2018  . Dyslipidemia 01/28/2018  . GERD (gastroesophageal reflux disease) 01/28/2018  . Pulmonary embolism (HCC) 01/28/2018  . Lactic acidosis 01/27/2018  . Acute pulmonary embolism (HCC) 01/20/2018  . Hypertensive urgency 01/19/2018  . Generalized weakness 01/19/2018  . ARF (acute renal failure) (HCC) 01/19/2018  . Weakness generalized 01/19/2018  . AKI (acute kidney injury) (HCC)   . Vitamin B 12 deficiency 01/01/2018  . Low vitamin D level 01/01/2018  . Syncope and collapse 12/30/2017  . Nausea and vomiting 12/29/2017  . Prolonged QT interval 12/29/2017  . CAP (community acquired pneumonia) 12/28/2017  . Dyspepsia 09/25/2017  . 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 Toe pain, right 11/17/2016  . Left shoulder pain 01/19/2016  . Hyperlipidemia 05/03/2014  . Disc disorder of lumbar region 12/30/2013  . Obesity (BMI 30-39.9) 07/01/2013  . Biliary dyskinesia 12/10/2011  . UTI (urinary tract infection) 07/24/2011  . Lumbar back pain 06/12/2011  . Symptomatic Parkinson disease (HCC) 02/26/2011  . Depression 09/28/2010  . Pre-operative cardiovascular examination 07/23/2010  . CAROTID BRUIT, RIGHT 06/29/2009  . CHEST PAIN-UNSPECIFIED 01/13/2009  . SKIN  RASH 10/11/2008  . EDEMA 08/25/2008  . OTHER URINARY INCONTINENCE 02/15/2008  . CHRONIC PAIN SYNDROME 12/31/2007  . BRUISE 09/14/2007  . SCOLIOSIS, LUMBAR SPINE 06/15/2007  . BACK PAIN, THORACIC REGION, RIGHT 06/11/2007  . MENISCUS TEAR 12/01/202008  . OTHER SYMPTOMS REFERABLE TO LOWER LEG JOINT 02/17/2007  . MYALGIA 02/10/2007  . CALF PAIN, RIGHT 02/10/2007  . DEPRESSION 01/01/2007  . HYPOKALEMIA, HX OF 01/01/2007  . TINEA CORPORIS 09/22/2006  . ALLERGIC DRUG REACTION, OPIOIDS 09/22/2006  . Essential hypertension 08/20/2006  . OSTEOARTHRITIS 08/20/2006    Past Surgical History:  Procedure  Laterality Date  . ABDOMINAL HYSTERECTOMY    . BACK SURGERY    . BREAST BIOPSY Right   . BREAST LUMPECTOMY    . BREAST LUMPECTOMY Right    "no cancer"  . DILATION AND CURETTAGE OF UTERUS    . JOINT REPLACEMENT    . KNEE ARTHROSCOPY     Bilateral  . KNEE ARTHROSCOPY Bilateral   . LUMBAR LAMINECTOMY/DECOMPRESSION MICRODISCECTOMY  2007   L3-4,4-5, L5hemilaminectomy with leopard cage internal fiaxation L3-5  . POSTERIOR LUMBAR FUSION    . ROTATOR CUFF REPAIR     Right  . SHOULDER ARTHROSCOPY W/ ROTATOR CUFF REPAIR Right   . TONSILLECTOMY    . TOTAL KNEE ARTHROPLASTY Bilateral   . VAGINAL HYSTERECTOMY       OB History   No obstetric history on file.      Home Medications    Prior to Admission medications   Medication Sig Start Date End Date Taking? Authorizing Provider  amLODipine-valsartan (EXFORGE) 10-320 MG tablet Take 1 tablet by mouth daily.   Yes [provider]  apixaban (ELIQUIS) 5 MG TABS tablet Take 1 tablet (5 mg total) by mouth 2 (two) times daily. Take 10 mg twice daily for 1 week then take 5 mg twice daily Patient taking differently: Take 5 mg by mouth 2 (two) times daily.  02/16/18  Yes Seabron Spates R, DO  carbidopa-levodopa (SINEMET IR) 10-100 MG tablet Take 2 tablets by mouth 3 (three) times daily.   Yes [provider]  Cholecalciferol (VITAMIN D-3) 1000 units CAPS Take 1 capsule (1,000 Units total) by mouth daily. 12/31/17  Yes Seabron Spates R, DO  feeding supplement (BOOST HIGH PROTEIN) LIQD Take 1 Container by mouth daily.   Yes [provider]  FLUoxetine (PROZAC) 40 MG capsule Take 40 mg by mouth daily.   Yes [provider]  folic acid (FOLVITE) 1 MG tablet Take 1 tablet (1 mg total) by mouth daily. 01/23/18  Yes Danford, Earl Lites, MD  haloperidol (HALDOL) 1 MG tablet Take 1 mg by mouth every evening.  04/13/18  Yes [provider]  HYDROcodone-acetaminophen (NORCO/VICODIN) 5-325 MG tablet Take 1  tablet by mouth every 6 (six) hours as needed for moderate pain. 04/28/18  Yes Seabron Spates R, DO  levETIRAcetam (KEPPRA) 500 MG tablet Take 1 tablet (500 mg total) by mouth 2 (two) times daily. 02/22/18  Yes Harris, Abigail, PA-C  LORazepam (ATIVAN) 1 MG tablet Take 1 mg by mouth every 6 (six) hours as needed for anxiety.   Yes [provider]  potassium chloride (K-DUR) 10 MEQ tablet Take 10 mEq by mouth daily.  04/21/18  Yes [provider]  temazepam (RESTORIL) 7.5 MG capsule TAKE 1 CAPSULE(7.5 MG) BY MOUTH AT BEDTIME AS NEEDED FOR SLEEP Patient taking differently: Take 7.5 mg by mouth at bedtime.  04/09/18  Yes Donato Schultz, DO  desoximetasone (TOPICORT) 0.05 % cream APPLY TO AFFECTED AREA TWICE A DAY Patient taking differently: Apply 1 application topically 2 (two) times daily as needed (dermatitis).  01/16/16   Donato Schultz, DO  diclofenac (VOLTAREN) 75 MG EC tablet TAKE 1 TABLET BY MOUTH TWICE DAILY WITH FOOD Patient not taking: Reported on 05/08/2018 04/26/18   Kerrin Champagne, MD  famotidine (PEPCID) 20 MG tablet Take 20 mg by mouth 2 (two) times daily as needed for heartburn.     [provider]  feeding supplement, ENSURE ENLIVE, (ENSURE ENLIVE) LIQD Take 237 mLs by mouth 2 (two) times daily between meals. Patient not taking: Reported on 05/08/2018 01/22/18   Alberteen Sam, MD  FLUoxetine (PROZAC) 20 MG capsule Take 3 capsules (60 mg total) by mouth daily. Patient not taking: Reported on 05/08/2018 03/13/18   Zola Button, Grayling Congress, DO  LORazepam (ATIVAN) 0.5 MG tablet 1/2-1 po tid prn Patient not taking: Reported on 05/08/2018 03/13/18   Donato Schultz, DO  NONFORMULARY OR COMPOUNDED ITEM Evaluate and admit to hospice if appropriate  Dx parkinson disease 04/03/18   Zola Button, Grayling Congress, DO  ondansetron (ZOFRAN-ODT) 4 MG disintegrating tablet Take 0.5 tablets (2 mg total) by mouth every 8 (eight) hours as needed for nausea or  vomiting. 02/19/18   Copland, Gwenlyn Found, MD  pantoprazole (PROTONIX) 40 MG tablet Take 1 tablet (40 mg total) by mouth 2 (two) times daily before a meal. Patient not taking: Reported on 05/08/2018 12/23/17   Donato Schultz, DO  Vitamin D, Ergocalciferol, (DRISDOL) 50000 units CAPS capsule Take 1 capsule (50,000 Units total) by mouth every 7 (seven) days. Patient taking differently: Take 50,000 Units by mouth every 7 (seven) days. Monday 12/31/17   Donato Schultz, DO    Family History Family History  Problem Relation Age of Onset  . Stroke Mother   . Parkinsonism Mother     Social History Social History   Tobacco Use  . Smoking status: Never Smoker  . Smokeless tobacco: Never Used  Substance Use Topics  . Alcohol use: Not Currently    Frequency: Never    Comment: 01/29/2018 "couple drinks/year"  . Drug use: Never     Allergies   Codeine; Gabapentin; Gabapentin; Latex; Morphine sulfate; Morphine sulfate; Penicillins; Sertraline; Sertraline hcl; and Penicillins   Review of Systems Review of Systems  Constitutional: Negative for chills and fever.  HENT: Negative for ear pain and sore throat.   Eyes: Negative for pain and visual disturbance.  Respiratory: Negative for cough and shortness of breath.   Cardiovascular: Negative for chest pain and palpitations.  Gastrointestinal: Negative for abdominal pain and vomiting.  Genitourinary: Negative for dysuria and hematuria.  Musculoskeletal: Positive for arthralgias. Negative for back pain.  Skin: Negative for color change and rash.  Neurological: Negative for seizures, syncope and headaches.  All other systems reviewed and are negative.    Physical Exam Updated Vital Signs BP (!) 153/76   Pulse 95   Temp 97.8 F (36.6 C)   Resp 16   Ht 5\' 6"  (1.676 m)   Wt 113.4 kg   SpO2 95%   BMI 40.35 kg/m   Physical Exam Vitals signs and nursing note reviewed.  Constitutional:      General: She is not in acute  distress.    Appearance: She is well-developed.  HENT:     Head: Normocephalic and atraumatic.  Eyes:     Extraocular Movements: Extraocular  movements intact.     Conjunctiva/sclera: Conjunctivae normal.     Pupils: Pupils are equal, round, and reactive to light.  Neck:     Musculoskeletal: Normal range of motion and neck supple.  Cardiovascular:     Rate and Rhythm: Normal rate and regular rhythm.     Pulses: Normal pulses.     Heart sounds: Normal heart sounds. No murmur.  Pulmonary:     Effort: Pulmonary effort is normal. No respiratory distress.     Breath sounds: Normal breath sounds.  Abdominal:     Palpations: Abdomen is soft.     Tenderness: There is no abdominal tenderness.  Musculoskeletal: Normal range of motion.        General: Tenderness (TTP to right hip area) present. No deformity or signs of injury.     Right lower leg: No edema.     Left lower leg: No edema.  Skin:    General: Skin is warm and dry.     Capillary Refill: Capillary refill takes less than 2 seconds.  Neurological:     General: No focal deficit present.     Mental Status: She is alert and oriented to person, place, and time.     Cranial Nerves: No cranial nerve deficit.     Sensory: No sensory deficit.      ED Treatments / Results  Labs (all labs ordered are listed, but only abnormal results are displayed) Labs Reviewed - No data to display  EKG None  Radiology Dg Chest 2 View  Result Date: 05/08/2018 CLINICAL DATA:  Pain EXAM: CHEST - 2 VIEW COMPARISON:  03/10/2018 FINDINGS: The heart size and mediastinal contours are within normal limits. Nonaneurysmal atherosclerotic aorta. Subsegmental atelectasis at the right base, similar in appearance to prior versus scarring. No effusion or pneumothorax. Osteoarthritis of the acromioclavicular and glenohumeral joints. Degenerative changes are noted along the dorsal spine. IMPRESSION: No active cardiopulmonary disease. Right basilar atelectasis  and/or scarring. Aortic atherosclerosis. Electronically Signed   By: Tollie Ethavid  Kwon M.D.   On: 05/08/2018 20:07   Dg Hip Unilat With Pelvis 2-3 Views Right  Result Date: 05/08/2018 CLINICAL DATA:  Patient fell out of wheelchair onto the right hip. Pain. EXAM: DG HIP (WITH OR WITHOUT PELVIS) 2-3V RIGHT COMPARISON:  None. FINDINGS: Posterior lumbar interbody fusion hardware from L3 through S1 without complicating features. No acute pelvic fracture or diastasis. Osteoarthritis of the sacroiliac joints and pubic symphysis. Joint space narrowing of both hips, right greater than left. No acute displaced fracture of either hip. Synovial herniation pit of the right femoral neck. IMPRESSION: 1. No acute displaced fracture of the bony pelvis or right hip. 2. Posterior lumbar interbody fusion hardware from L3 through S1 without complicating features. Electronically Signed   By: Tollie Ethavid  Kwon M.D.   On: 05/08/2018 20:02    Procedures Procedures (including critical care time)  Medications Ordered in ED Medications  HYDROcodone-acetaminophen (NORCO/VICODIN) 5-325 MG per tablet 1 tablet (1 tablet Oral Given 05/08/18 1957)     Initial Impression / Assessment and Plan / ED Course  I have reviewed the triage vital signs and the nursing notes.  Pertinent labs & imaging results that were available during my care of the patient were reviewed by me and considered in my medical decision making (see chart for details).        [name] is an 85 year old female with history of chronic pain, depression, anxiety, wheelchair-bound due to chronic weakness who presents to the ED with  right hip pain after falling out of her wheelchair.  Patient did not lose consciousness or hit her head.  Neurologically at baseline.  Has some tenderness in the right hip but no obvious deformity and good range of motion in the right lower extremity.  Will obtain chest x-ray and hip x-ray to rule out any injuries.  Does not have any  abdominal tenderness or midline spinal tenderness on exam.  No neck pain, no midline spinal tenderness, no headache, no loss consciousness and no need for head or neck CT.  X-ray showed no acute findings.  No fractures or malalignment.  Patient was given her home dose of Norco.  Discharged in good condition.  This chart was dictated using voice recognition software.  Despite best efforts to proofread,  errors can occur which can change the documentation meaning.    Final Clinical Impressions(s) / ED Diagnoses   Final diagnoses:  Contusion of right hip, initial encounter    ED Discharge Orders    None       Virgina NorfolkCuratolo, Starlette Thurow, DO 05/08/18 2012

## 2018-05-08 NOTE — ED Notes (Signed)
[facility] contacted for transport back home. Paperwork printed and at bedside.

## 2018-05-08 NOTE — ED Triage Notes (Signed)
Patient is AOx4 and uses wheel chair. Patient comes from home. Patient arrived via [facility]. Patient fell out of wheel chair onto right hip. Pain is 4 out of 10 tender, no deformity noted. Patient is on Blood thinner. Elquis. Hx of parkinson's and is at baseline currently according to family.

## 2018-05-08 NOTE — ED Notes (Signed)
Patient verbalized permission for nurse to sign for discharge. Discharge paperwork reviewed with patient and family member present. All questions and concerns addressed.

## 2018-05-12 ENCOUNTER — Ambulatory Visit: Payer: Medicare Other | Admitting: Family Medicine

## 2018-05-26 ENCOUNTER — Ambulatory Visit (INDEPENDENT_AMBULATORY_CARE_PROVIDER_SITE_OTHER): Payer: Medicare Other | Admitting: Family Medicine

## 2018-05-26 ENCOUNTER — Encounter: Payer: Self-pay | Admitting: Family Medicine

## 2018-05-26 VITALS — BP 126/82 | HR 78 | Resp 14 | Ht 66.0 in

## 2018-05-26 DIAGNOSIS — G8929 Other chronic pain: Secondary | ICD-10-CM | POA: Diagnosis not present

## 2018-05-26 DIAGNOSIS — Z79899 Other long term (current) drug therapy: Secondary | ICD-10-CM

## 2018-05-26 DIAGNOSIS — I2699 Other pulmonary embolism without acute cor pulmonale: Secondary | ICD-10-CM

## 2018-05-26 DIAGNOSIS — G20 Parkinson's disease: Secondary | ICD-10-CM

## 2018-05-26 DIAGNOSIS — G20A1 Parkinson's disease without dyskinesia, without mention of fluctuations: Secondary | ICD-10-CM

## 2018-05-26 DIAGNOSIS — N39498 Other specified urinary incontinence: Secondary | ICD-10-CM

## 2018-05-26 MED ORDER — NONFORMULARY OR COMPOUNDED ITEM: 0 refills | Status: AC

## 2018-05-26 MED ORDER — NONFORMULARY OR COMPOUNDED ITEM: 1 refills | Status: AC

## 2018-05-26 MED ORDER — FOLIC ACID 1 MG PO TABS: 1.0000 mg | ORAL_TABLET | Freq: Every day | ORAL | 3 refills | Status: AC

## 2018-05-26 MED ORDER — HYDROCODONE-ACETAMINOPHEN 5-325 MG PO TABS: 1.0000 | ORAL_TABLET | Freq: Four times a day (QID) | ORAL | 0 refills | Status: AC | PRN

## 2018-05-26 NOTE — Assessment & Plan Note (Signed)
rx for hospital bed and catheter s printed con't with hospice con't neuro f/u

## 2018-05-26 NOTE — Patient Instructions (Signed)
Parkinson Disease  Parkinson disease is a long-term (chronic) condition that gets worse over time (is progressive). Parkinson disease limits your ability to control your movements and move your body normally. This condition is a type of movement disorder.  Each person with Parkinson disease is affected differently. The condition can range from mild to severe. Parkinson disease tends to progress slowly over several years.  What are the causes?  Parkinson disease results from a loss of brain cells (neurons) in a specific part of the brain (substantia nigra). Some of the neurons in the substantia nigra make an important brain chemical (dopamine). Dopamine is needed to control movement. As the condition gets worse, neurons make less dopamine. This makes it hard to move or control your movements.  The exact cause of why neurons are lost or produce less dopamine is not known. Genetic and environmental factors may contribute to the cause of Parkinson disease.  What increases the risk?  This condition is more likely to develop in:   Men.   People who are 60 years of age or older.   People who have a family history of Parkinson disease.  What are the signs or symptoms?  Symptoms of this condition can vary from person to person. The main (primary) symptoms are related to movement (motor symptoms). These include:   Uncontrolled shaking movements (tremor). Tremors usually start in a hand or foot when you are resting (resting tremor). The tremor may stop when you move around.   Slowing of movement. You may lose facial expression and have trouble making the small movements that are needed to button clothing or brush your teeth. You may walk with short, shuffling steps.   Stiff movement (rigidity). This mostly affects your arms, legs, neck, and upper body. You may walk without swinging your arms. Rigidity can be painful.   Loss of balance and stability when standing. You may sway, fall backward, and have trouble making  turns.  Secondary motor symptoms of this condition include:   Shrinking handwriting.   Stooped posture.   Slowed speech.   Trouble swallowing.   Drooling.   Sexual dysfunction.   Muscle cramps.   Loss of smell.  Additional symptoms that are not related to movement include:   Constipation.   Mood swings.   Depression or anxiety.   Sleep disturbances.   Confusion.   Loss of mental abilities (dementia).   Low blood pressure.   Trouble concentrating.  How is this diagnosed?  Parkinson disease can be hard to diagnose in its early stages. A diagnosis may be made based on symptoms, a medical history, and physical exam. During your exam, your health care provider will look for:   Lack of facial expression.   Resting tremor.   Stiffness in your neck, arms, and legs.   Abnormal walk.   Trouble with balance.  You may have brain imaging tests done to check for a loss of dopamine-producing areas of the brain. Your healthcare provider may also grade the severity of your condition as mild, moderate, or advanced. Parkinson disease progression is different for everyone. You may not progress to the advanced stage.  Mild Parkinson disease involves:   Movement problems that do not affect daily activities.   Movement problems on one side of the body.   Movement problems that are controlled with medicines.   Good response to exercise.  Moderate Parkinson disease involves:   Movement problems on both sides of the body.   Slowing of movement.     Coordination and balance problems.   Less of a response to medicine.   More side effects from medicines.  Advanced Parkinson disease involves:   Extreme difficulty walking.   Inability to live alone safely.   Signs of dementia.   Difficulty controlling symptoms with medicine.  How is this treated?  There is no cure for Parkinson disease. Treatment focuses on relieving your symptoms. Treatment may include:   Medicines.   Speech, occupational, and physical  therapy.   Surgery.  Everyone responds to medicines differently. Your response may change over time. Work with your health care provider to find the best medicines for you. These may include:   Dopamine replacement drugs. These are the most effective medicines. A long-term side effect of these medicines is uncontrolled movements (dyskinesias).   Dopamine agonists. These drugs act like dopamine to stimulate dopamine receptors in the brain. Side effects include nausea and sleepiness, but they cause less dyskinesia.   Other medicines to reduce tremor, prevent dopamine breakdown, reduce dyskinesia, and reduce dementia that is related to Parkinson disease.  Another treatment is deep brain stimulation surgery to reduce tremors and dyskinesia. This procedure involves placing electrodes in the brain. The electrodes are attached to an electric pulse generator that acts like a pacemaker for your brain. This may be an option if you have had the condition for at least four years and are not responding well to medicines.  Follow these instructions at home:          Take over-the-counter and prescription medicines only as told by your health care provider.   Install grab bars and railings in your home to prevent falls.   Follow instructions from your health care provider about eating or drinking restrictions.   Return to your normal activities as told by your health care provider. Ask your health care provider what activities are safe for you.   Get regular exercise as told by your health care provider or a physical therapist.   Keep all follow-up visits as told by your health care provider. This is important. These include any visits with a speech therapist or occupational therapist.   Consider joining a support group for people with Parkinson disease.  Contact a health care provider if:   Medicines do not help your symptoms.   You are unsteady or have fallen at home.   You need more support to function well at  home.   You have trouble swallowing.   You have severe constipation.   You are struggling with side effects from your medicines.   You see or hear things that are not real (hallucinate).   You feel confused, anxious, or depressed.  Get help right away if:   You are injured after a fall.   You cannot swallow without choking.   You have chest pain or trouble breathing.   You do not feel safe at home.  This information is not intended to replace advice given to you by your health care provider. Make sure you discuss any questions you have with your health care provider.  Document Released: 03/01/2000 Document Revised: 08/07/2015 Document Reviewed: 12/23/2014  [employer]vier Interactive Patient Education  2019 [employer]

## 2018-05-26 NOTE — Assessment & Plan Note (Signed)
con't chronic pain meds

## 2018-05-26 NOTE — Progress Notes (Signed)
Patient ID: [name], female    DOB: [birth year]  Age: 85 y.o. MRN: [id number]    Subjective:  Subjective  HPI [name] presents for f/u pain meds.  Her daughter needs rx for catheter and a hosp bed.  She does have hospice coming to the house.    Review of Systems  Constitutional: Negative for chills and fever.  HENT: Negative for congestion and hearing loss.   Eyes: Negative for discharge.  Respiratory: Negative for cough and shortness of breath.   Cardiovascular: Negative for chest pain, palpitations and leg swelling.  Gastrointestinal: Negative for abdominal pain, blood in stool, constipation, diarrhea, nausea and vomiting.  Genitourinary: Negative for dysuria, frequency, hematuria and urgency.  Musculoskeletal: Negative for back pain and myalgias.  Skin: Negative for rash.  Allergic/Immunologic: Negative for environmental allergies.  Neurological: Positive for weakness. Negative for dizziness and headaches.  Hematological: Does not bruise/bleed easily.  Psychiatric/Behavioral: Positive for confusion, decreased concentration and dysphoric mood. Negative for self-injury, sleep disturbance and suicidal ideas. The patient is not nervous/anxious.     History Past Medical History:  Diagnosis Date  . Allergic drug reaction    Opioids  . Anxiety   . 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 Arthritis    "knees" (01/29/2018)  . Back pain, thoracic   . Benign familial tremor   . Bruise   . Calf pain   . Chest pain    -- Myoview negative 2007  . Chronic pain syndrome   . Depression   . Edema   . Gallstones   . GERD (gastroesophageal reflux disease)   . Heart murmur   . High cholesterol   . History of blood transfusion    "related to a surgery"  . History of hiatal hernia   . Hypertension   . Hypokalemia   . Meniscus tear   . Myalgia   . Osteoarthritis   . Other symptoms referable to lower leg joint   . Other urinary  incontinence   . Pneumonia    "once" (01/29/2018)  . PONV (postoperative nausea and vomiting)   . Scoliosis   . Skin rash   . Tinea corporis   . Urinary incontinence     She has a past surgical history that includes Abdominal hysterectomy; Rotator cuff repair; Lumbar laminectomy/decompression microdiscectomy (2007); Knee arthroscopy; Breast lumpectomy; Tonsillectomy; Joint replacement; Total knee arthroplasty (Bilateral); Back surgery; Posterior lumbar fusion; Shoulder arthroscopy w/ rotator cuff repair (Right); Vaginal hysterectomy; Dilation and curettage of uterus; Knee arthroscopy (Bilateral); Breast biopsy (Right); and Breast lumpectomy (Right).   Her family history includes Parkinsonism in her mother; Stroke in her mother.She reports that she has never smoked. She has never used smokeless tobacco. She reports previous alcohol use. She reports that she does not use drugs.  Current Outpatient Medications on File Prior to Visit  Medication Sig Dispense Refill  . amLODipine-valsartan (EXFORGE) 10-320 MG tablet Take 1 tablet by mouth daily.    Marland Kitchen apixaban (ELIQUIS) 5 MG TABS tablet Take 1 tablet (5 mg total) by mouth 2 (two) times daily. Take 10 mg twice daily for 1 week then take 5 mg twice daily (Patient taking differently: Take 5 mg by mouth 2 (two) times daily. ) 90 tablet 1  . carbidopa-levodopa (SINEMET IR) 10-100 MG tablet Take 2 tablets by mouth 3 (three) times daily.    . Cholecalciferol (VITAMIN D-3) 1000 units CAPS Take 1 capsule (1,000 Units total) by  mouth daily. 30 capsule 5  . desoximetasone (TOPICORT) 0.05 % cream APPLY TO AFFECTED AREA TWICE A DAY (Patient taking differently: Apply 1 application topically 2 (two) times daily as needed (dermatitis). ) 60 g 1  . famotidine (PEPCID) 20 MG tablet Take 20 mg by mouth 2 (two) times daily as needed for heartburn.     . feeding supplement (BOOST HIGH PROTEIN) LIQD Take 1 Container by mouth daily.    . feeding supplement, ENSURE ENLIVE,  (ENSURE ENLIVE) LIQD Take 237 mLs by mouth 2 (two) times daily between meals. 237 mL 12  . FLUoxetine (PROZAC) 20 MG capsule Take 3 capsules (60 mg total) by mouth daily. 90 capsule 0  . haloperidol (HALDOL) 1 MG tablet Take 1 mg by mouth every evening.     . levETIRAcetam (KEPPRA) 500 MG tablet Take 1 tablet (500 mg total) by mouth 2 (two) times daily. 60 tablet 2  . LORazepam (ATIVAN) 1 MG tablet Take 1 mg by mouth every 6 (six) hours as needed for anxiety.    . NONFORMULARY OR COMPOUNDED ITEM Evaluate and admit to hospice if appropriate  Dx parkinson disease 1 each 1  . ondansetron (ZOFRAN-ODT) 4 MG disintegrating tablet Take 0.5 tablets (2 mg total) by mouth every 8 (eight) hours as needed for nausea or vomiting. 20 tablet 0  . pantoprazole (PROTONIX) 40 MG tablet Take 1 tablet (40 mg total) by mouth 2 (two) times daily before a meal. 60 tablet 3  . potassium chloride (K-DUR) 10 MEQ tablet Take 10 mEq by mouth daily.     . temazepam (RESTORIL) 7.5 MG capsule TAKE 1 CAPSULE(7.5 MG) BY MOUTH AT BEDTIME AS NEEDED FOR SLEEP (Patient taking differently: Take 7.5 mg by mouth at bedtime. ) 30 capsule 2  . Vitamin D, Ergocalciferol, (DRISDOL) 50000 units CAPS capsule Take 1 capsule (50,000 Units total) by mouth every 7 (seven) days. (Patient taking differently: Take 50,000 Units by mouth every 7 (seven) days. Monday) 4 capsule 2  . hydrOXYzine (ATARAX/VISTARIL) 25 MG tablet TK 1 T PO  Q 8 H PRN FOR ITCHING    . QUEtiapine (SEROQUEL) 50 MG tablet      No current facility-administered medications on file prior to visit.      Objective:  Objective  Physical Exam Vitals signs and nursing note reviewed.  Constitutional:      Appearance: She is well-developed.  HENT:     Head: Normocephalic and atraumatic.  Eyes:     Conjunctiva/sclera: Conjunctivae normal.  Neck:     Musculoskeletal: Normal range of motion and neck supple.     Thyroid: No thyromegaly.     Vascular: No carotid bruit or JVD.    Cardiovascular:     Rate and Rhythm: Normal rate and regular rhythm.     Heart sounds: Normal heart sounds. No murmur.  Pulmonary:     Effort: Pulmonary effort is normal. No respiratory distress.     Breath sounds: Normal breath sounds. No wheezing or rales.  Chest:     Chest wall: No tenderness.  Neurological:     Mental Status: She is alert. She is disoriented.     Motor: Weakness present.     Gait: Gait abnormal.    BP 126/82 (BP Location: Left Arm, Patient Position: Sitting, Cuff Size: Normal)   Pulse 78   Resp 14   Ht  (1.676 m)   SpO2 98%   BMI 40.35 kg/m  Wt Readings from Last 3 Encounters:  05/08/18 250 lb (113.4 kg)  04/10/18 254 lb (115.2 kg)  03/13/18 254 lb (115.2 kg)     Lab Results  Component Value Date   WBC 8.1 03/10/2018   HGB 9.9 (L) 03/10/2018   HCT 32.3 (L) 03/10/2018   PLT 422 (H) 03/10/2018   GLUCOSE 137 (H) 03/10/2018   CHOL 130 02/28/2017   TRIG 71.0 02/28/2017   HDL 44.20 02/28/2017   LDLDIRECT 155.7 09/17/2012   LDLCALC 71 02/28/2017   ALT 3 (L) 03/06/2018   AST 7 (L) 03/06/2018   NA 141 03/10/2018   K 2.9 (L) 03/10/2018   CL 105 03/10/2018   CREATININE 1.24 (H) 03/10/2018   BUN 20 03/10/2018   CO2 22 03/10/2018   TSH 0.70 03/06/2018   INR 2.09 01/27/2018   HGBA1C 6.2 05/11/2014   MICROALBUR 0.8 05/11/2014    Dg Chest 2 View  Result Date: 05/08/2018 CLINICAL DATA:  Pain EXAM: CHEST - 2 VIEW COMPARISON:  03/10/2018 FINDINGS: The heart size and mediastinal contours are within normal limits. Nonaneurysmal atherosclerotic aorta. Subsegmental atelectasis at the right base, similar in appearance to prior versus scarring. No effusion or pneumothorax. Osteoarthritis of the acromioclavicular and glenohumeral joints. Degenerative changes are noted along the dorsal spine. IMPRESSION: No active cardiopulmonary disease. Right basilar atelectasis and/or scarring. Aortic atherosclerosis. Electronically Signed   By: Tollie Eth M.D.   On:  05/08/2018 20:07   Dg Hip Unilat With Pelvis 2-3 Views Right  Result Date: 05/08/2018 CLINICAL DATA:  Patient fell out of wheelchair onto the right hip. Pain. EXAM: DG HIP (WITH OR WITHOUT PELVIS) 2-3V RIGHT COMPARISON:  None. FINDINGS: Posterior lumbar interbody fusion hardware from L3 through S1 without complicating features. No acute pelvic fracture or diastasis. Osteoarthritis of the sacroiliac joints and pubic symphysis. Joint space narrowing of both hips, right greater than left. No acute displaced fracture of either hip. Synovial herniation pit of the right femoral neck. IMPRESSION: 1. No acute displaced fracture of the bony pelvis or right hip. 2. Posterior lumbar interbody fusion hardware from L3 through S1 without complicating features. Electronically Signed   By: Tollie Eth M.D.   On: 05/08/2018 20:02     Assessment & Plan:  Plan  I have discontinued Sonal Dewitz. Sanabia's diclofenac. I am also having her start on NONFORMULARY OR COMPOUNDED ITEM, NONFORMULARY OR COMPOUNDED ITEM, and NONFORMULARY OR COMPOUNDED ITEM. Additionally, I am having her maintain her desoximetasone, pantoprazole, famotidine, Vitamin D (Ergocalciferol), Vitamin D-3, feeding supplement (ENSURE ENLIVE), apixaban, ondansetron, levETIRAcetam, FLUoxetine, NONFORMULARY OR COMPOUNDED ITEM, temazepam, haloperidol, potassium chloride, amLODipine-valsartan, carbidopa-levodopa, LORazepam, feeding supplement, hydrOXYzine, QUEtiapine, HYDROcodone-acetaminophen, and folic acid.  Meds ordered this encounter  Medications  . NONFORMULARY OR COMPOUNDED ITEM    Sig: purwick catheter and supples  #1  With years worth of refills for supplies dx parkinsons dz,  Incontinence,    Dispense:  1 each    Refill:  1  . NONFORMULARY OR COMPOUNDED ITEM    Sig: Hospital bed  #1  As directed   Dx parkinsons dx----- fully electric bed    Dispense:  1 each    Refill:  0  . HYDROcodone-acetaminophen (NORCO/VICODIN) 5-325 MG tablet    Sig: Take 1  tablet by mouth every 6 (six) hours as needed for moderate pain.    Dispense:  30 tablet    Refill:  0  . NONFORMULARY OR COMPOUNDED ITEM    Sig: Wheelchair transportation [place] -- pt needs Zenaida Niece for transportation due to [employer] disease  Dispense:  1 each    Refill:  0  . folic acid (FOLVITE) 1 MG tablet    Sig: Take 1 tablet (1 mg total) by mouth daily.    Dispense:  90 tablet    Refill:  3    Problem List Items Addressed This Visit      Unprioritized   High risk medication use   Relevant Orders   Drug Tox Monitor 1 w/Conf, Oral Fld   Other chronic pain   Relevant Medications   HYDROcodone-acetaminophen (NORCO/VICODIN) 5-325 MG tablet   Other Relevant Orders   Drug Tox Monitor 1 w/Conf, Oral Fld   OTHER URINARY INCONTINENCE    con't chronic pain meds       Parkinson disease (HCC) - Primary    rx for hospital bed and catheter s printed con't with hospice con't neuro f/u       Relevant Medications   NONFORMULARY OR COMPOUNDED ITEM   NONFORMULARY OR COMPOUNDED ITEM   NONFORMULARY OR COMPOUNDED ITEM   Pulmonary embolism (HCC)    On anticoagulation         Follow-up: Return in about 3 months (around 08/26/2018), or if symptoms worsen or fail to improve.  Donato Schultz, DO

## 2018-05-26 NOTE — Assessment & Plan Note (Signed)
On anticoagulation 

## 2018-06-02 LAB — DRUG TOX MONITOR 1 W/CONF, ORAL FLD
Alprazolam: NEGATIVE ng/mL (ref <0.50)
Amphetamines: NEGATIVE ng/mL (ref <10)
BENZODIAZEPINES: POSITIVE ng/mL — AB (ref <0.50)
Barbiturates: NEGATIVE ng/mL (ref <10)
Buprenorphine: NEGATIVE ng/mL (ref <0.10)
Chlordiazepoxide: NEGATIVE ng/mL (ref <0.50)
Clonazepam: NEGATIVE ng/mL (ref <0.50)
Cocaine: NEGATIVE ng/mL (ref <5.0)
Diazepam: NEGATIVE ng/mL (ref <0.50)
Fentanyl: NEGATIVE ng/mL (ref <0.10)
Flunitrazepam: NEGATIVE ng/mL (ref <0.50)
Flurazepam: NEGATIVE ng/mL (ref <0.50)
Heroin Metabolite: NEGATIVE ng/mL (ref <1.0)
Lorazepam: NEGATIVE ng/mL (ref <0.50)
MARIJUANA: NEGATIVE ng/mL (ref <2.5)
MDMA: NEGATIVE ng/mL (ref <10)
Meprobamate: NEGATIVE ng/mL (ref <2.5)
Methadone: NEGATIVE ng/mL (ref <5.0)
Midazolam: NEGATIVE ng/mL (ref <0.50)
Nicotine Metabolite: NEGATIVE ng/mL (ref <5.0)
Nordiazepam: NEGATIVE ng/mL (ref <0.50)
Opiates: NEGATIVE ng/mL (ref <2.5)
Oxazepam: NEGATIVE ng/mL (ref <0.50)
PHENCYCLIDINE: NEGATIVE ng/mL (ref <10)
TRIAZOLAM: NEGATIVE ng/mL (ref <0.50)
Tapentadol: NEGATIVE ng/mL (ref <5.0)
Temazepam: 0.55 ng/mL — ABNORMAL HIGH (ref <0.50)
Tramadol: NEGATIVE ng/mL (ref <5.0)
Zolpidem: NEGATIVE ng/mL (ref <5.0)

## 2018-06-04 ENCOUNTER — Ambulatory Visit: Admitting: Physical Therapy

## 2018-06-10 NOTE — Telephone Encounter (Signed)
Opened in error

## 2018-06-23 ENCOUNTER — Other Ambulatory Visit: Payer: Self-pay | Admitting: Family Medicine

## 2018-07-06 ENCOUNTER — Encounter: Payer: Self-pay | Admitting: Family Medicine

## 2018-07-06 ENCOUNTER — Other Ambulatory Visit: Payer: Self-pay

## 2018-07-06 ENCOUNTER — Ambulatory Visit (INDEPENDENT_AMBULATORY_CARE_PROVIDER_SITE_OTHER): Payer: Medicare Other | Admitting: Family Medicine

## 2018-07-06 DIAGNOSIS — M25572 Pain in left ankle and joints of left foot: Secondary | ICD-10-CM | POA: Diagnosis not present

## 2018-07-06 NOTE — Progress Notes (Signed)
Virtual Visit via Video Note  I connected with [name] on 07/06/18 at  2:15 PM EDT by a video enabled telemedicine application and verified that I am speaking with the correct person using two identifiers.   I discussed the limitations of evaluation and management by telemedicine and the availability of in person appointments. The patient expressed understanding and agreed to proceed.  History of Present Illness: Pt is home with her daughter  She c/o bruise L ankle --she fell a few nights ago and family did not realize it was bruised until recently-- she is unable to weight bear and it is very painful to touch    Provider -- working from home  Observations/Objective: Unable to get vs due to video visit Pt sitting comfortably in her chair-- L foot / ankle black and blue  Painful to touch and unable to put weight on it --- pain is 10/10   Assessment and Plan: 1. Acute left ankle pain Suspect may be a fracture -- Will get app with ortho --- daughter wil be able to arrange transportation tomorrow con't with ice and rest   - Ambulatory referral to Orthopedic Surgery   Follow Up Instructions:    I discussed the assessment and treatment plan with the patient. The patient was provided an opportunity to ask questions and all were answered. The patient agreed with the plan and demonstrated an understanding of the instructions.   The patient was advised to call back or seek an in-person evaluation if the symptoms worsen or if the condition fails to improve as anticipated.  I provided 15 minutes of non-face-to-face time during this encounter.   Donato Schultz, DO

## 2018-07-11 ENCOUNTER — Other Ambulatory Visit: Payer: Self-pay | Admitting: Family Medicine

## 2018-07-14 ENCOUNTER — Other Ambulatory Visit: Payer: Self-pay | Admitting: Family Medicine

## 2018-07-14 DIAGNOSIS — I2699 Other pulmonary embolism without acute cor pulmonale: Secondary | ICD-10-CM

## 2018-07-14 MED ORDER — APIXABAN 5 MG PO TABS: 5.0000 mg | ORAL_TABLET | Freq: Two times a day (BID) | ORAL | 5 refills | Status: AC

## 2018-07-14 NOTE — Telephone Encounter (Signed)
Can you verify the correct sig?

## 2018-08-14 ENCOUNTER — Telehealth: Payer: Self-pay | Admitting: *Deleted

## 2018-08-14 NOTE — Telephone Encounter (Signed)
Would you like to order ua dip and urine culture?

## 2018-08-14 NOTE — Telephone Encounter (Signed)
Hospice will run it and will hopefully have results and send her something in.

## 2018-08-14 NOTE — Telephone Encounter (Signed)
Copied from CRM [id number]. Topic: General - Other >> Aug 14, 2018 11:23 AM Elliot Gault wrote: Caller name: Tykeya, Anthis Relation to pt: daughter Call back number: [phone]   Reason for call:  Patient states daughter is under hospice care at her home. Patient experiencing dementia, frequent urination no abdominal pain. A home UTI test was done reflecting a high volume of leukocytes in urine. Daughter requesting urine orders, daughter would like to bring in a sample today, please advise

## 2018-08-14 NOTE — Telephone Encounter (Signed)
Yes that is fine

## 2018-08-27 ENCOUNTER — Telehealth: Payer: Self-pay | Admitting: Family Medicine

## 2018-08-27 DIAGNOSIS — R410 Disorientation, unspecified: Secondary | ICD-10-CM

## 2018-08-27 DIAGNOSIS — I10 Essential (primary) hypertension: Secondary | ICD-10-CM

## 2018-08-27 DIAGNOSIS — R82998 Other abnormal findings in urine: Secondary | ICD-10-CM

## 2018-08-27 NOTE — Telephone Encounter (Signed)
Patient's daughter, Mikell Camp would like a return phone call because the patient is having confusion, UTI's, very dark urine and sores on her body.  Please advise. Call [phone]

## 2018-08-28 NOTE — Telephone Encounter (Signed)
Pt's daughter is calling back wanting to know status of message and want call back from office concerning her moms needs

## 2018-09-01 NOTE — Telephone Encounter (Signed)
Spoke with patients daughter got the information from her about her moms medication that she is currently taking.  Spoke with Dr. Etter Sjogren she stated we needed the lab orders and also labs drawn for the patient.  [place] [phone] spoke with [place] she will fax over culture results and I will fax over lab orders for the patient.

## 2018-09-03 ENCOUNTER — Telehealth: Payer: Self-pay | Admitting: Family Medicine

## 2018-09-03 NOTE — Telephone Encounter (Signed)
Patient's daughter calling to check status of lab results from 6/16. Please advise.

## 2018-09-03 NOTE — Telephone Encounter (Signed)
Received labs, waiting for Urine culture.

## 2018-09-11 NOTE — Telephone Encounter (Signed)
Pt daughter is calling and would like someone to call her back concerning lab results

## 2018-09-15 ENCOUNTER — Ambulatory Visit: Payer: Medicare Other | Admitting: Family Medicine

## 2018-09-22 ENCOUNTER — Ambulatory Visit: Payer: Medicare Other | Admitting: Family Medicine

## 2018-09-29 ENCOUNTER — Other Ambulatory Visit: Payer: Self-pay | Admitting: Family Medicine

## 2018-09-29 ENCOUNTER — Encounter: Payer: Self-pay | Admitting: Family Medicine

## 2018-09-29 ENCOUNTER — Other Ambulatory Visit: Payer: Self-pay

## 2018-09-29 ENCOUNTER — Ambulatory Visit (INDEPENDENT_AMBULATORY_CARE_PROVIDER_SITE_OTHER): Payer: Medicare Other | Admitting: Family Medicine

## 2018-09-29 DIAGNOSIS — N390 Urinary tract infection, site not specified: Secondary | ICD-10-CM

## 2018-09-29 DIAGNOSIS — D649 Anemia, unspecified: Secondary | ICD-10-CM

## 2018-09-29 DIAGNOSIS — G20 Parkinson's disease: Secondary | ICD-10-CM | POA: Diagnosis not present

## 2018-09-29 DIAGNOSIS — R319 Hematuria, unspecified: Secondary | ICD-10-CM

## 2018-09-29 NOTE — Assessment & Plan Note (Signed)
Recurrent uti  Pt on macrobid 3x week per hospice dr

## 2018-09-29 NOTE — Assessment & Plan Note (Signed)
Per neuro 

## 2018-09-29 NOTE — Progress Notes (Signed)
Virtual Visit via Telephone Note  I connected with [name] on 10/01/18 at  1:45 PM EDT by telephone and verified that I am speaking with the correct person using two identifiers.  Location: Patient: home with daughter and second daughter was also on the line  Provider: office    I discussed the limitations, risks, security and privacy concerns of performing an evaluation and management service by telephone and the availability of in person appointments. I also discussed with the patient that there may be a patient responsible charge related to this service. The patient expressed understanding and agreed to proceed.   History of Present Illness: Pt is home with her daughter crandell and second daughter is on the line  The had questions about urology and labs done by home health/ hospice.  We had to call to get the urology notes and labs were in care everywhere.  Hospice dr . -- treated her with macrobid and she is now on macrobid 3x a weekl     Observations/Objective: No vitals obtained  Pt resting comfortable and doing much better since on macrobid   Assessment and Plan: 1. Parkinson disease ([place]) Per neuro at [facility]   2. Urinary tract infection with hematuria, site unspecified Pt on macrobid per hospice dr Pt has seen urology   3. Anemia, unspecified type Check ifob  Recheck labs   4. Recurrent UTI See above  Follow Up Instructions:    I discussed the assessment and treatment plan with the patient. The patient was provided an opportunity to ask questions and all were answered. The patient agreed with the plan and demonstrated an understanding of the instructions.   The patient was advised to call back or seek an in-person evaluation if the symptoms worsen or if the condition fails to improve as anticipated.  I provided 30 minutes of non-face-to-face time during this encounter.   Ann Held, DO

## 2018-09-29 NOTE — Assessment & Plan Note (Signed)
Recheck labs  I fob to be sent to pt---  We sent one earlier to the pt but they did not do them and the cards were lost

## 2018-10-01 DIAGNOSIS — N390 Urinary tract infection, site not specified: Secondary | ICD-10-CM | POA: Insufficient documentation

## 2018-10-01 NOTE — Assessment & Plan Note (Signed)
Pt is on macrobid 3x a week from hospice doctor

## 2018-10-02 ENCOUNTER — Telehealth: Payer: Self-pay

## 2018-10-02 ENCOUNTER — Other Ambulatory Visit: Payer: Self-pay | Admitting: Family Medicine

## 2018-10-02 DIAGNOSIS — D649 Anemia, unspecified: Secondary | ICD-10-CM

## 2018-10-02 DIAGNOSIS — N189 Chronic kidney disease, unspecified: Secondary | ICD-10-CM

## 2018-10-02 MED ORDER — NONFORMULARY OR COMPOUNDED ITEM: 0 refills | Status: AC

## 2018-10-02 NOTE — Telephone Encounter (Signed)
Orders faxed to [facility] for CBC-D, IBC w/ Ferritin, CMP. Confirmation received.

## 2018-10-09 ENCOUNTER — Telehealth: Payer: Self-pay

## 2018-10-09 NOTE — Telephone Encounter (Signed)
Called and spoke with [name].  Made them aware that we have not yet received any information regarding the labs that were ordered on 7/17, and that we are concerned with our lack of communication with them.  For this reason, we requested that they assume the role of PCP.  They stated they would need a change of physician form signed by pt's family, and that they would reach out to the family.  Called pt's daughter, [name], and made her aware of the above conversation.  She stated that she does not yet feel comfortable with [name] assuming the role of PCP.  She stated [name] has assigned a new nurse to the pt's care. Her name is [name] and her contact number is [phone].  She stated she would discuss [name] assuming PCP status with her sister and alert [place] of their decision.

## 2018-10-13 ENCOUNTER — Other Ambulatory Visit (INDEPENDENT_AMBULATORY_CARE_PROVIDER_SITE_OTHER): Payer: Medicare Other

## 2018-10-13 DIAGNOSIS — D649 Anemia, unspecified: Secondary | ICD-10-CM

## 2018-10-13 LAB — FECAL OCCULT BLOOD, IMMUNOCHEMICAL: Fecal Occult Bld: NEGATIVE

## 2018-11-14 ENCOUNTER — Emergency Department (HOSPITAL_COMMUNITY)
Admission: EM | Admit: 2018-11-14 | Discharge: 2018-11-15 | Disposition: A | Discharge status: Home / Self Care | Payer: Medicare Other | Attending: Emergency Medicine | Admitting: Emergency Medicine

## 2018-11-14 ENCOUNTER — Encounter (HOSPITAL_COMMUNITY): Payer: Self-pay

## 2018-11-14 ENCOUNTER — Other Ambulatory Visit: Payer: Self-pay

## 2018-11-14 DIAGNOSIS — Z79899 Other long term (current) drug therapy: Secondary | ICD-10-CM | POA: Insufficient documentation

## 2018-11-14 DIAGNOSIS — Z20828 Contact with and (suspected) exposure to other viral communicable diseases: Secondary | ICD-10-CM | POA: Insufficient documentation

## 2018-11-14 DIAGNOSIS — N390 Urinary tract infection, site not specified: Secondary | ICD-10-CM

## 2018-11-14 DIAGNOSIS — G20 Parkinson's disease: Secondary | ICD-10-CM | POA: Insufficient documentation

## 2018-11-14 DIAGNOSIS — Z91040 Latex allergy status: Secondary | ICD-10-CM | POA: Insufficient documentation

## 2018-11-14 DIAGNOSIS — R112 Nausea with vomiting, unspecified: Secondary | ICD-10-CM | POA: Insufficient documentation

## 2018-11-14 DIAGNOSIS — G8929 Other chronic pain: Secondary | ICD-10-CM | POA: Insufficient documentation

## 2018-11-14 DIAGNOSIS — M545 Low back pain, unspecified: Secondary | ICD-10-CM

## 2018-11-14 DIAGNOSIS — I10 Essential (primary) hypertension: Secondary | ICD-10-CM | POA: Diagnosis not present

## 2018-11-14 LAB — CBC WITH DIFFERENTIAL/PLATELET
Abs Immature Granulocytes: 0.05 10*3/uL (ref 0.00–0.07)
Basophils Absolute: 0 10*3/uL (ref 0.0–0.1)
Basophils Relative: 0 %
Eosinophils Absolute: 0 10*3/uL (ref 0.0–0.5)
Eosinophils Relative: 0 %
HCT: 38.1 % (ref 36.0–46.0)
Hemoglobin: 11.8 g/dL — ABNORMAL LOW (ref 12.0–15.0)
Immature Granulocytes: 1 %
Lymphocytes Relative: 10 %
Lymphs Abs: 1 10*3/uL (ref 0.7–4.0)
MCH: 26.6 pg (ref 26.0–34.0)
MCHC: 31 g/dL (ref 30.0–36.0)
MCV: 86 fL (ref 80.0–100.0)
Monocytes Absolute: 0.5 10*3/uL (ref 0.1–1.0)
Monocytes Relative: 5 %
Neutro Abs: 8.6 10*3/uL — ABNORMAL HIGH (ref 1.7–7.7)
Neutrophils Relative %: 84 %
Platelets: 294 10*3/uL (ref 150–400)
RBC: 4.43 MIL/uL (ref 3.87–5.11)
RDW: 14.2 % (ref 11.5–15.5)
WBC: 10.1 10*3/uL (ref 4.0–10.5)
nRBC: 0 % (ref 0.0–0.2)

## 2018-11-14 LAB — URINALYSIS, ROUTINE W REFLEX MICROSCOPIC
Bilirubin Urine: NEGATIVE
Glucose, UA: NEGATIVE mg/dL
Hgb urine dipstick: NEGATIVE
Ketones, ur: NEGATIVE mg/dL
Nitrite: NEGATIVE
Protein, ur: NEGATIVE mg/dL
Specific Gravity, Urine: 1.014 (ref 1.005–1.030)
pH: 6 (ref 5.0–8.0)

## 2018-11-14 LAB — BASIC METABOLIC PANEL
Anion gap: 13 (ref 5–15)
BUN: 45 mg/dL — ABNORMAL HIGH (ref 8–23)
CO2: 20 mmol/L — ABNORMAL LOW (ref 22–32)
Calcium: 9.8 mg/dL (ref 8.9–10.3)
Chloride: 106 mmol/L (ref 98–111)
Creatinine, Ser: 1.36 mg/dL — ABNORMAL HIGH (ref 0.44–1.00)
GFR calc Af Amer: 42 mL/min — ABNORMAL LOW (ref >60)
GFR calc non Af Amer: 36 mL/min — ABNORMAL LOW (ref >60)
Glucose, Bld: 95 mg/dL (ref 70–99)
Potassium: 5.1 mmol/L (ref 3.5–5.1)
Sodium: 139 mmol/L (ref 135–145)

## 2018-11-14 MED ORDER — FENTANYL CITRATE (PF) 100 MCG/2ML IJ SOLN
100.0000 ug | INTRAMUSCULAR | Status: AC | PRN
  Administered 2018-11-14 (×2): 100 ug via INTRAVENOUS
  Filled 2018-11-14 (×3): qty 2

## 2018-11-14 MED ORDER — SODIUM CHLORIDE 0.9 % IV SOLN
1.0000 g | Freq: Once | INTRAVENOUS | Status: AC
  Administered 2018-11-14: 1 g via INTRAVENOUS
  Filled 2018-11-14: qty 10

## 2018-11-14 MED ORDER — FENTANYL CITRATE (PF) 100 MCG/2ML IJ SOLN
50.0000 ug | Freq: Once | INTRAMUSCULAR | Status: AC
  Administered 2018-11-14: 50 ug via INTRAVENOUS
  Filled 2018-11-14: qty 2

## 2018-11-14 MED ORDER — SODIUM CHLORIDE 0.9 % IV BOLUS
500.0000 mL | Freq: Once | INTRAVENOUS | Status: AC
  Administered 2018-11-14: 500 mL via INTRAVENOUS

## 2018-11-14 NOTE — ED Provider Notes (Addendum)
[facility] DEPT Provider Note   CSN: [id number] Arrival date & time: 11/14/18  1831     History   Chief Complaint Chief Complaint  Patient presents with  . Emesis  . Altered Mental Status    HPI [name] is a 85 y.o. female.     HPI   Patient here with her daughter, who is reporting the patient and vomiting is yesterday, anytime she tries to eat or drink.  She is currently taking Septra, for UTI, has taken 3 doses.  She was diagnosed and treated 3 days ago.  No fever, cough, complaint of abdominal pain.  She has chronic back pain.  She is bedbound, from a chronic disability.  Her daughter is concerned about her being tremulous, and having back pain.  She was recently evaluated by her neurologist, possible seizure disorder, but he elected to keep her off antiepileptic drugs, and increased her medication for Parkinson's disease.  No known sick contacts.  Patient is usually under hospice care, for her chronic disability.  There are no other known modifying factors.  Past Medical History:  Diagnosis Date  . Allergic drug reaction    Opioids  . Anxiety   . 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 Arthritis    "knees" (01/29/2018)  . Back pain, thoracic   . Benign familial tremor   . Bruise   . Calf pain   . Chest pain    -- Myoview negative 2007  . Chronic pain syndrome   . Depression   . Edema   . Gallstones   . GERD (gastroesophageal reflux disease)   . Heart murmur   . High cholesterol   . History of blood transfusion    "related to a surgery"  . History of hiatal hernia   . Hypertension   . Hypokalemia   . Meniscus tear   . Myalgia   . Osteoarthritis   . Other symptoms referable to lower leg joint   . Other urinary incontinence   . Pneumonia    "once" (01/29/2018)  . PONV (postoperative nausea and vomiting)   . Scoliosis   . Skin rash   . Tinea corporis   . Urinary  incontinence     Patient Active Problem List   Diagnosis Date Noted  . Recurrent UTI 10/01/2018  . Anemia 09/29/2018  . Other chronic pain 05/26/2018  . High risk medication use 05/26/2018  . Hypotension arterial 03/13/2018  . History of community acquired pneumonia 02/08/2018  . Pulmonary embolism and infarction ([place]) 02/08/2018  . Syncope, vasovagal 01/28/2018  . Parkinson disease ([place]) 01/28/2018  . Essential hypertension 01/28/2018  . Dyslipidemia 01/28/2018  . GERD (gastroesophageal reflux disease) 01/28/2018  . Pulmonary embolism ([place]) 01/28/2018  . Lactic acidosis 01/27/2018  . Acute pulmonary embolism ([place]) 01/20/2018  . Hypertensive urgency 01/19/2018  . Generalized weakness 01/19/2018  . ARF (acute renal failure) ([place]) 01/19/2018  . Weakness generalized 01/19/2018  . AKI (acute kidney injury) ([place])   . Vitamin B 12 deficiency 01/01/2018  . Low vitamin D level 01/01/2018  . Syncope and collapse 12/30/2017  . Nausea and vomiting 12/29/2017  . Prolonged QT interval 12/29/2017  . CAP (community acquired pneumonia) 12/28/2017  . Dyspepsia 09/25/2017  . 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 Toe pain, right 11/17/2016  . Left shoulder pain 01/19/2016  . Hyperlipidemia 05/03/2014  . Disc disorder of lumbar region 12/30/2013  . Obesity (BMI 30-39.9)  07/01/2013  . Biliary dyskinesia 12/10/2011  . UTI (urinary tract infection) 07/24/2011  . Lumbar back pain 06/12/2011  . Symptomatic Parkinson disease ([facility]) 02/26/2011  . Depression 09/28/2010  . Pre-operative cardiovascular examination 07/23/2010  . CAROTID BRUIT, RIGHT 06/29/2009  . CHEST PAIN-UNSPECIFIED 01/13/2009  . SKIN RASH 10/11/2008  . EDEMA 08/25/2008  . OTHER URINARY INCONTINENCE 02/15/2008  . CHRONIC PAIN SYNDROME 12/31/2007  . BRUISE 09/14/2007  . SCOLIOSIS, LUMBAR SPINE 06/15/2007  . BACK PAIN, THORACIC REGION, RIGHT 06/11/2007  . MENISCUS TEAR 2020-07-1206  . OTHER SYMPTOMS REFERABLE TO LOWER LEG JOINT  02/17/2007  . MYALGIA 02/10/2007  . CALF PAIN, RIGHT 02/10/2007  . DEPRESSION 01/01/2007  . HYPOKALEMIA, HX OF 01/01/2007  . TINEA CORPORIS 09/22/2006  . ALLERGIC DRUG REACTION, OPIOIDS 09/22/2006  . Essential hypertension 08/20/2006  . Osteoarthritis 08/20/2006    Past Surgical History:  Procedure Laterality Date  . ABDOMINAL HYSTERECTOMY    . BACK SURGERY    . BREAST BIOPSY Right   . BREAST LUMPECTOMY    . BREAST LUMPECTOMY Right    "no cancer"  . DILATION AND CURETTAGE OF UTERUS    . JOINT REPLACEMENT    . KNEE ARTHROSCOPY     Bilateral  . KNEE ARTHROSCOPY Bilateral   . LUMBAR LAMINECTOMY/DECOMPRESSION MICRODISCECTOMY  2007   L3-4,4-5, L5hemilaminectomy with leopard cage internal fiaxation L3-5  . POSTERIOR LUMBAR FUSION    . ROTATOR CUFF REPAIR     Right  . SHOULDER ARTHROSCOPY W/ ROTATOR CUFF REPAIR Right   . TONSILLECTOMY    . TOTAL KNEE ARTHROPLASTY Bilateral   . VAGINAL HYSTERECTOMY       OB History   No obstetric history on file.      Home Medications    Prior to Admission medications   Medication Sig Start Date End Date Taking? Authorizing Provider  amLODipine-valsartan (EXFORGE) 5-160 MG tablet Take 0.5 tablets by mouth daily. 10/09/18  Yes [provider]  Baclofen 5 MG TABS Take 5 mg by mouth at bedtime.   Yes [provider]  carbidopa-levodopa (SINEMET IR) 10-100 MG tablet Take 4 tablets by mouth 4 (four) times daily.    Yes [provider]  Cholecalciferol (VITAMIN D-3) 1000 units CAPS Take 1 capsule (1,000 Units total) by mouth daily. 12/31/17  Yes Seabron Spates R, DO  colchicine 0.6 MG tablet Take 0.6 mg by mouth 2 (two) times daily. Take on Monday, Wednesday, and Friday 10/11/18  Yes [provider]  desoximetasone (TOPICORT) 0.05 % cream APPLY TO AFFECTED AREA TWICE A DAY Patient taking differently: Apply 1 application topically 2 (two) times daily as needed (dermatitis).  01/16/16  Yes Seabron Spates  R, DO  feeding supplement (BOOST HIGH PROTEIN) LIQD Take 1 Container by mouth daily.   Yes [provider]  feeding supplement, ENSURE ENLIVE, (ENSURE ENLIVE) LIQD Take 237 mLs by mouth 2 (two) times daily between meals. 01/22/18  Yes Danford, Earl Lites, MD  FLUoxetine (PROZAC) 20 MG capsule TAKE 3 CAPSULES(60 MG) BY MOUTH DAILY Patient taking differently: Take 40 mg by mouth daily.  07/13/18  Yes Donato Schultz, DO  folic acid (FOLVITE) 1 MG tablet Take 1 tablet (1 mg total) by mouth daily. 05/26/18  Yes Seabron Spates R, DO  furosemide (LASIX) 20 MG tablet Take 20 mg by mouth daily. 10/09/18  Yes [provider]  HYDROcodone-acetaminophen (NORCO/VICODIN) 5-325 MG tablet Take 1 tablet by mouth every 6 (six) hours as needed for moderate  pain. 05/26/18  Yes Seabron Spates R, DO  hydrOXYzine (ATARAX/VISTARIL) 50 MG tablet Take 50 mg by mouth every 6 (six) hours as needed for itching. 10/09/18  Yes [provider]  LORazepam (ATIVAN) 1 MG tablet Take 2 mg by mouth 2 (two) times daily as needed for anxiety.    Yes [provider]  Melatonin 5 MG TABS Take 5 mg by mouth at bedtime as needed for sleep. 09/28/18  Yes [provider]  ondansetron (ZOFRAN-ODT) 4 MG disintegrating tablet Take 0.5 tablets (2 mg total) by mouth every 8 (eight) hours as needed for nausea or vomiting. 02/19/18  Yes Copland, Gwenlyn Found, MD  potassium chloride (K-DUR) 10 MEQ tablet Take 10 mEq by mouth daily.  04/21/18  Yes [provider]  QUEtiapine (SEROQUEL) 25 MG tablet Take 25-50 mg by mouth See admin instructions. Take 25mg  by mouth in the morning and take 50mg  by mouth at bedtime 10/09/18  Yes [provider]  risperiDONE (RISPERDAL) 0.5 MG tablet Take 0.5 mg by mouth at bedtime.   Yes [provider]  rOPINIRole (REQUIP) 0.25 MG tablet Take 0.25-0.5 mg by mouth daily. Take 0.25mg  by mouth in the morning and take 0.50mg  by mouth at bedtime  10/09/18  Yes [provider]  senna (SENOKOT) 8.6 MG TABS tablet Take 1 tablet by mouth daily.   Yes [provider]  sulfamethoxazole-trimethoprim (BACTRIM DS) 800-160 MG tablet Take 1 tablet by mouth daily.   Yes [provider]  temazepam (RESTORIL) 7.5 MG capsule TAKE 1 CAPSULE(7.5 MG) BY MOUTH AT BEDTIME AS NEEDED FOR SLEEP Patient taking differently: Take 15 mg by mouth at bedtime.  04/09/18  Yes Donato Schultz, DO  Vitamin D, Ergocalciferol, (DRISDOL) 50000 units CAPS capsule Take 1 capsule (50,000 Units total) by mouth every 7 (seven) days. Patient taking differently: Take 50,000 Units by mouth every 7 (seven) days. Monday 12/31/17  Yes Seabron Spates R, DO  apixaban (ELIQUIS) 5 MG TABS tablet Take 1 tablet (5 mg total) by mouth 2 (two) times daily. Patient not taking: Reported on 11/14/2018 07/14/18   Zola Button, Grayling Congress, DO  famotidine (PEPCID) 20 MG tablet Take 20 mg by mouth 2 (two) times daily as needed for heartburn.     [provider]  levETIRAcetam (KEPPRA) 500 MG tablet Take 1 tablet (500 mg total) by mouth 2 (two) times daily. Patient not taking: Reported on 11/14/2018 02/22/18   Arthor Captain, PA-C  NONFORMULARY OR COMPOUNDED ITEM Evaluate and admit to hospice if appropriate  Dx parkinson disease 04/03/18   Zola Button, Grayling Congress, DO  NONFORMULARY OR COMPOUNDED ITEM purwick catheter and supples  #1  With years worth of refills for supplies dx parkinsons dz,  Incontinence, 05/26/18   Zola Button, Grayling Congress, DO  NONFORMULARY OR COMPOUNDED [facility] bed  #1  As directed   Dx parkinsons dx----- fully electric bed 05/26/18   Donato Schultz, DO  NONFORMULARY OR COMPOUNDED ITEM Wheelchair transportation [place] -- pt needs Zenaida Niece for transportation due to parkinsons disease 05/26/18   Zola Button, Grayling Congress, DO  NONFORMULARY OR COMPOUNDED ITEM Cbcd, ibc and ferritin , cmp  Dx anemia, chronic renal insufficiency 10/02/18   Zola Button, Grayling Congress,  DO  pantoprazole (PROTONIX) 40 MG tablet Take 1 tablet (40 mg total) by mouth 2 (two) times daily before a meal. Patient not taking: Reported on 11/14/2018 12/23/17   Donato Schultz, DO  Family History Family History  Problem Relation Age of Onset  . Stroke Mother   . Parkinsonism Mother     Social History Social History   Tobacco Use  . Smoking status: Never Smoker  . Smokeless tobacco: Never Used  Substance Use Topics  . Alcohol use: Not Currently    Frequency: Never    Comment: 01/29/2018 "couple drinks/year"  . Drug use: Never     Allergies   Codeine, Gabapentin, Gabapentin, Latex, Morphine sulfate, Morphine sulfate, Penicillins, Sertraline, Sertraline hcl, and Penicillins   Review of Systems Review of Systems  All other systems reviewed and are negative.    Physical Exam Updated Vital Signs BP (!) 158/112 (BP Location: Right Arm)   Pulse 70   Temp 99.6 F (37.6 C) (Rectal)   Resp 16   Ht 5\' 6"  (1.676 m)   Wt 113.4 kg   SpO2 96%   BMI 40.35 kg/m   Physical Exam Vitals signs and nursing note reviewed.  Constitutional:      General: She is not in acute distress.    Appearance: She is well-developed. She is obese. She is ill-appearing. She is not toxic-appearing or diaphoretic.  HENT:     Head: Normocephalic and atraumatic.     Right Ear: External ear normal.     Left Ear: External ear normal.  Eyes:     Conjunctiva/sclera: Conjunctivae normal.     Pupils: Pupils are equal, round, and reactive to light.  Neck:     Musculoskeletal: Normal range of motion and neck supple.     Trachea: Phonation normal.  Cardiovascular:     Rate and Rhythm: Normal rate and regular rhythm.     Heart sounds: Normal heart sounds.  Pulmonary:     Effort: Pulmonary effort is normal. No respiratory distress.     Breath sounds: Normal breath sounds. No stridor.  Abdominal:     Palpations: Abdomen is soft.     Tenderness: There is no abdominal tenderness.   Musculoskeletal: Normal range of motion.  Skin:    General: Skin is warm and dry.  Neurological:     Mental Status: She is alert.     Cranial Nerves: No cranial nerve deficit.     Sensory: No sensory deficit.     Motor: No abnormal muscle tone.     Coordination: Coordination normal.  Psychiatric:     Comments: She is nonverbal      ED Treatments / Results  Labs (all labs ordered are listed, but only abnormal results are displayed) Labs Reviewed  URINALYSIS, ROUTINE W REFLEX MICROSCOPIC - Abnormal; Notable for the following components:      Result Value   Leukocytes,Ua LARGE (*)    Bacteria, UA RARE (*)    All other components within normal limits  BASIC METABOLIC PANEL - Abnormal; Notable for the following components:   CO2 20 (*)    BUN 45 (*)    Creatinine, Ser 1.36 (*)    GFR calc non Af Amer 36 (*)    GFR calc Af Amer 42 (*)    All other components within normal limits  CBC WITH DIFFERENTIAL/PLATELET - Abnormal; Notable for the following components:   Hemoglobin 11.8 (*)    Neutro Abs 8.6 (*)    All other components within normal limits  URINE CULTURE  SARS CORONAVIRUS 2 (TAT 6-12 HRS)    EKG None  Radiology No results found.  Procedures .Critical Care Performed by: Mancel BaleWentz, Sharaine Delange, MD Authorized by: Effie ShyWentz,  Mechele Collin, MD   Critical care provider statement:    Critical care time (minutes):  35   Critical care start time:  11/14/2018 6:55 PM   Critical care end time:  11/14/2018 11:45 PM   Critical care time was exclusive of:  Separately billable procedures and treating other patients   Critical care was time spent personally by me on the following activities:  Blood draw for specimens, development of treatment plan with patient or surrogate, discussions with consultants, evaluation of patient's response to treatment, examination of patient, obtaining history from patient or surrogate, ordering and performing treatments and interventions, ordering and review of  laboratory studies, pulse oximetry, re-evaluation of patient's condition, review of old charts and ordering and review of radiographic studies   (including critical care time)  Medications Ordered in ED Medications  fentaNYL (SUBLIMAZE) injection 100 mcg (100 mcg Intravenous Given 11/14/18 2210)  fentaNYL (SUBLIMAZE) injection 50 mcg (50 mcg Intravenous Given 11/14/18 2054)  sodium chloride 0.9 % bolus 500 mL (500 mLs Intravenous New Bag/Given 11/14/18 2200)     Initial Impression / Assessment and Plan / ED Course  I have reviewed the triage vital signs and the nursing notes.  Pertinent labs & imaging results that were available during my care of the patient were reviewed by me and considered in my medical decision making (see chart for details).  Clinical Course as of Nov 13 2317  Sat Nov 14, 2018  2315 Normal except elevated leukocytes, 25-50 WBCs, with rare bacteria on cath sample.  Urinalysis, Routine w reflex microscopic(!) [EW]  2316 Normal  CBC with Differential(!) [EW]  2316 Normal except CO2 low, BUN high, creatinine high, GFR low  Basic metabolic panel(!) [EW]    Clinical Course User Index [EW] Mancel Bale, MD        Patient Vitals for the past 24 hrs:  BP Temp Temp src Pulse Resp SpO2 Height Weight  11/14/18 2313 (!) 158/112 - - 70 16 96 % - -  11/14/18 2138 - 99.6 F (37.6 C) Rectal - - - - -  11/14/18 2030 (!) 164/126 - - 81 16 98 % - -  11/14/18 2000 (!) 164/110 - - 76 17 97 % - -  11/14/18 1934 (!) 148/91 98.8 F (37.1 C) Oral 78 18 96 % 5\' 6"  (1.676 m) 113.4 kg    11:28 PM Reevaluation with update and discussion. After initial assessment and treatment, an updated evaluation reveals patient is now verbal and communicative and states that she feels "better."  She is not ready to try drinking or eating, yet.  I discussed these findings with the patient and her daughter, and the possibility of persistent infection despite ongoing management with Septra.  They  would like to change to a different antibiotic.  Rocephin ordered.  Additional IV fluids ordered.  Patient will be observed and hopefully be able to tolerate oral fluids and be discharged on cephalexin, instead of Septra for UTI.Mancel Bale   Medical Decision Making: Urinary tract infection with vomiting.  Incidental back pain which is chronic but somewhat worse than baseline.  Also incidental elevation of blood pressures likely secondary to her illness.  Patient's daughter states that she takes a small dose of amlodipine, because her blood pressure tends to be fairly low.  I doubt hypertensive urgency at this time.  I doubt sepsis or metabolic instability.  IV Rocephin ordered anticipating replacement of scepter with cephalexin for UTI.  Patient will be observed for additional time, prior  to disposition.  CRITICAL CARE-yes Performed by: Mancel BaleElliott Byron Tipping  Nursing Notes Reviewed/ Care Coordinated Applicable Imaging Reviewed Interpretation of Laboratory Data incorporated into ED treatment  Plan-follow-up by oncoming provider team for disposition.  Final Clinical Impressions(s) / ED Diagnoses   Final diagnoses:  Urinary tract infection without hematuria, site unspecified  Nausea and vomiting, intractability of vomiting not specified, unspecified vomiting type  Low back pain without sciatica, unspecified back pain laterality, unspecified chronicity  Hypertension, unspecified type    ED Discharge Orders    None       Mancel BaleWentz, Lain Tetterton, MD 11/14/18 2332    Mancel BaleWentz, Olney Monier, MD 11/24/18 [phone]

## 2018-11-14 NOTE — ED Triage Notes (Signed)
Pt BIB EMS from home, daughter lives with pt. Pt being treated for recurrent UTI, vomiting for 24 hrs. Pt has hx of Parkinson's.  Pt is A&O to self today, per pt family pt baseline is A&Ox4.   EMS gave IM zofran  160/88 94% RA CBG 89

## 2018-11-15 DIAGNOSIS — R112 Nausea with vomiting, unspecified: Secondary | ICD-10-CM | POA: Diagnosis not present

## 2018-11-15 LAB — SARS CORONAVIRUS 2 (TAT 6-24 HRS): SARS Coronavirus 2: NEGATIVE

## 2018-11-15 MED ORDER — ONDANSETRON 4 MG PO TBDP: 4.0000 mg | ORAL_TABLET | Freq: Three times a day (TID) | ORAL | 0 refills | Status: AC | PRN

## 2018-11-15 MED ORDER — FENTANYL CITRATE (PF) 100 MCG/2ML IJ SOLN: 12.5000 ug | Freq: Once | INTRAMUSCULAR | Status: DC

## 2018-11-15 MED ORDER — ONDANSETRON HCL 4 MG/2ML IJ SOLN
4.0000 mg | Freq: Once | INTRAMUSCULAR | Status: AC
  Administered 2018-11-15: 4 mg via INTRAVENOUS
  Filled 2018-11-15: qty 2

## 2018-11-15 MED ORDER — HYDROCODONE-ACETAMINOPHEN 5-325 MG PO TABS
1.0000 | ORAL_TABLET | Freq: Once | ORAL | Status: AC
  Administered 2018-11-15: 1 via ORAL
  Filled 2018-11-15: qty 1

## 2018-11-15 MED ORDER — CEPHALEXIN 500 MG PO CAPS: 500.0000 mg | ORAL_CAPSULE | Freq: Three times a day (TID) | ORAL | 0 refills | Status: AC

## 2018-11-15 MED ORDER — ACETAMINOPHEN 325 MG PO TABS
650.0000 mg | ORAL_TABLET | Freq: Once | ORAL | Status: AC
  Administered 2018-11-15: 650 mg via ORAL
  Filled 2018-11-15: qty 2

## 2018-11-15 NOTE — ED Provider Notes (Signed)
I assumed care of this patient from Dr. Eulis Foster.  Please see their note for further details of Hx, PE.  Briefly patient is a 85 y.o. female who presented with UTI here with emesis. Plan to treat symptomatically and reassess.  Labs reassuring. Given zofran for nausea. Took oral pain meds and fluids. No emesis here.  The patient is safe for discharge with strict return precautions.   The patient appears reasonably screened and/or stabilized for discharge and I doubt any other medical condition or other [facility] requiring further screening, evaluation, or treatment in the ED at this time prior to discharge.  Disposition: Discharge  Condition: Good  I have discussed the results, Dx and Tx plan with the patient and daughter who expressed understanding and agree(s) with the plan. Discharge instructions discussed at great length. The patient and daughter were given strict return precautions who verbalized understanding of the instructions. No further questions at time of discharge.    ED Discharge Orders         Ordered    cephALEXin (KEFLEX) 500 MG capsule  3 times daily     11/15/18 0731    ondansetron (ZOFRAN ODT) 4 MG disintegrating tablet  Every 8 hours PRN     11/15/18 0731               Fatima Blank, MD 11/15/18 [phone]

## 2018-11-16 ENCOUNTER — Telehealth: Payer: Self-pay | Admitting: *Deleted

## 2018-11-16 NOTE — Telephone Encounter (Signed)
Copied from [place] [id number]. Topic: General - Other >> Nov 13, 2018 12:11 PM Burchel, [name] R wrote: Pt's daughter requesting a call back re: assessment needed by [place].  Please call:   Johanna: [phone]

## 2018-11-17 ENCOUNTER — Encounter: Payer: Self-pay | Admitting: *Deleted

## 2018-11-17 LAB — URINE CULTURE: Culture: 80000 — AB

## 2018-11-17 NOTE — Telephone Encounter (Signed)
Daughter wanted to know if we could fill out paperwork for [facility].  She will fax them to [place].

## 2018-11-18 ENCOUNTER — Telehealth: Payer: Self-pay | Admitting: *Deleted

## 2018-11-18 NOTE — Telephone Encounter (Signed)
Post ED Visit - Positive Culture Follow-up  Culture report reviewed by antimicrobial stewardship pharmacist: [place] Team []  Elenor Quinones, Pharm.D. []  Heide Guile, Pharm.D., BCPS AQ-ID []  Parks Neptune, Pharm.D., BCPS []  Alycia Rossetti, Pharm.D., BCPS []  [place], Pharm.D., BCPS, AAHIVP []  Legrand Como, Pharm.D., BCPS, AAHIVP []  Salome Arnt, PharmD, BCPS []  Johnnette Gourd, PharmD, BCPS []  Hughes Better, PharmD, BCPS []  Leeroy Cha, PharmD []  Laqueta Linden, PharmD, BCPS []  Albertina Parr, PharmD  [facility] Team []  Leodis Sias, PharmD []  Lindell Spar, PharmD []  Royetta Asal, PharmD []  Graylin Shiver, Rph []  Rema Fendt) Glennon Mac, PharmD []  Arlyn Dunning, PharmD []  Netta Cedars, PharmD [x]  Dia Sitter, PharmD []  Leone Haven, PharmD []  Gretta Arab, PharmD []  Theodis Shove, PharmD []  Peggyann Juba, PharmD []  Reuel Boom, PharmD   Positive urine culture Treated with Cephalexin, organism sensitive to the same and no further patient follow-up is required at this time.  Harlon Flor [facility] 11/18/2018, 9:51 AM

## 2018-12-24 ENCOUNTER — Telehealth: Payer: Self-pay

## 2018-12-24 NOTE — Telephone Encounter (Signed)
I faxed a lot of things yesterday. If they were orders I put them into scan after I faxed them.

## 2018-12-24 NOTE — Telephone Encounter (Signed)
Do you know anything about this? 

## 2018-12-24 NOTE — Telephone Encounter (Signed)
Pretty sure I did that Tuesday

## 2018-12-24 NOTE — Telephone Encounter (Signed)
Copied from [place] [id number]. Topic: Quick Communication - See Telephone Encounter >> Dec 24, 2018  9:03 AM [place] wrote: CRM for notification. See Telephone encounter for: 12/24/18.[facility] calling sending 2nd request for Dr. Carollee Herter or any MD to sign off on Hospice plan of Care Forms, resending.Marland KitchenMarland Kitchen

## 2018-12-25 NOTE — Telephone Encounter (Signed)
Can you just document if you fax please?  This was the form I gave you.

## 2018-12-29 NOTE — Telephone Encounter (Signed)
Form has been faxed.

## 2019-02-16 DEATH — deceased

## 2019-03-19 DEATH — deceased

## 2019-06-11 IMAGING — CT CT ANGIO CHEST
2 of 6 series · 18 of 46 positions shown · IV contrast (APPLIED)
Comparison: 01/19/2018 noncontrast chest CT.

CLINICAL DATA: 83-year-old female with acute shortness of breath
and elevated D-dimer.

EXAM:
CT ANGIOGRAPHY CHEST WITH CONTRAST
TECHNIQUE: Multidetector CT imaging of the chest was performed using the
standard protocol during bolus administration of intravenous
contrast. Multiplanar CT image reconstructions and MIPs were
obtained to evaluate the vascular anatomy.
CONTRAST:  100mL H2V6A2-QPF IOPAMIDOL (H2V6A2-QPF) INJECTION 76%

[Series 5: thins · axial · 0.82mm/px · z∈[-222,+45]mm · 16 of 293 slices shown]
[im 13/293  lung]
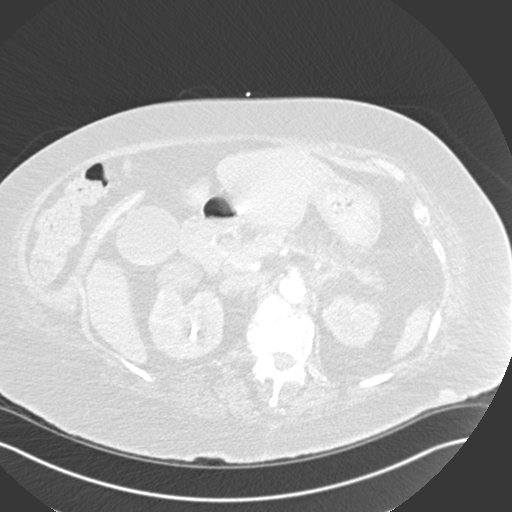
[im 39/293  soft-tissue]
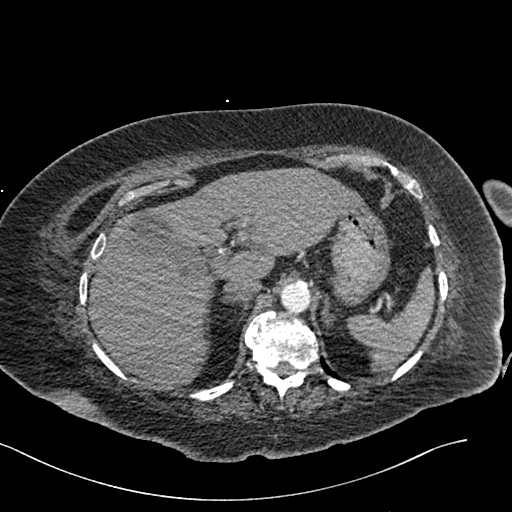
[im 51/293  lung]
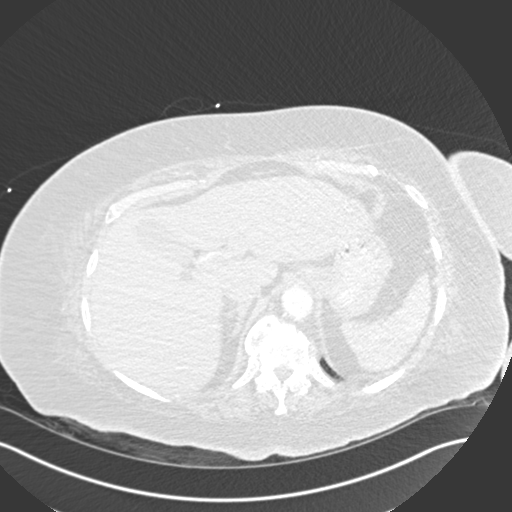
[im 64/293  soft-tissue]
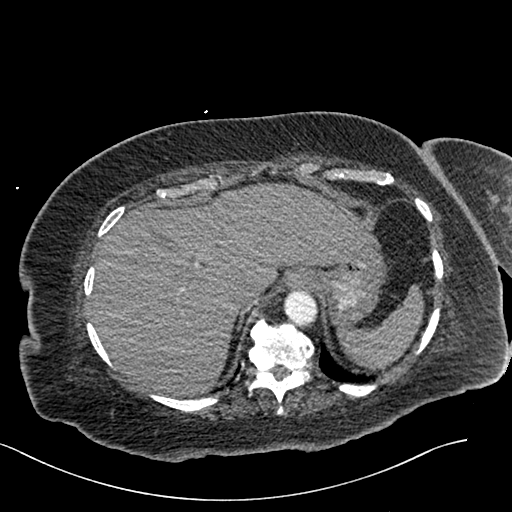
[im 89/293  lung]
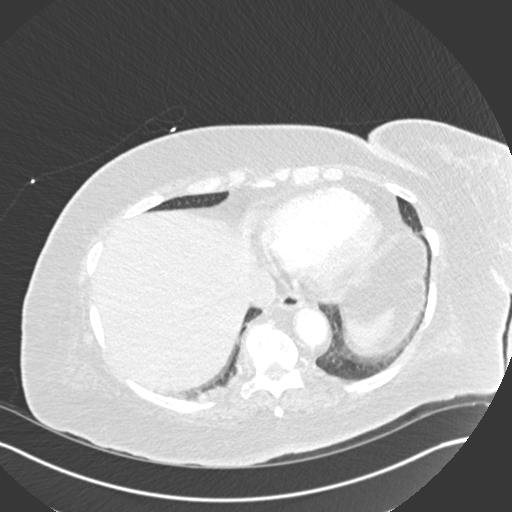
[im 102/293  soft-tissue]
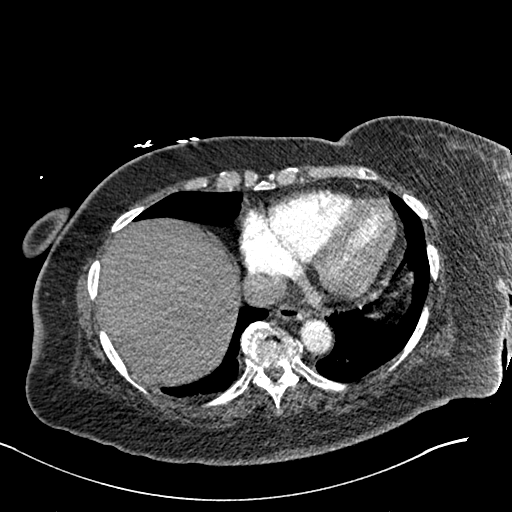
[im 115/293  lung]
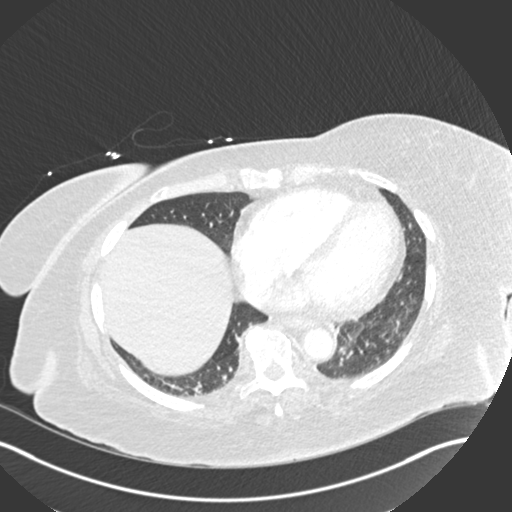
[im 140/293  soft-tissue]
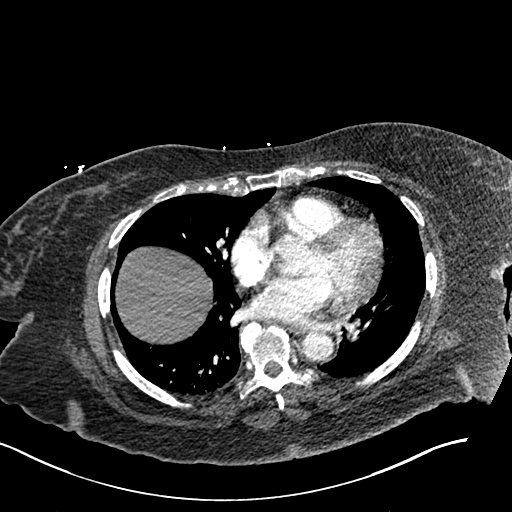
[im 153/293  lung]
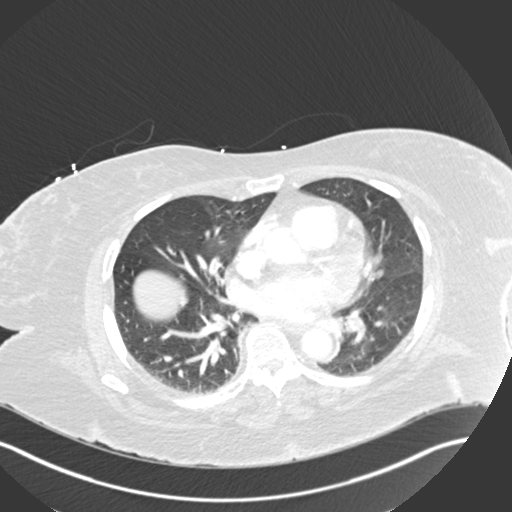
[im 178/293  soft-tissue]
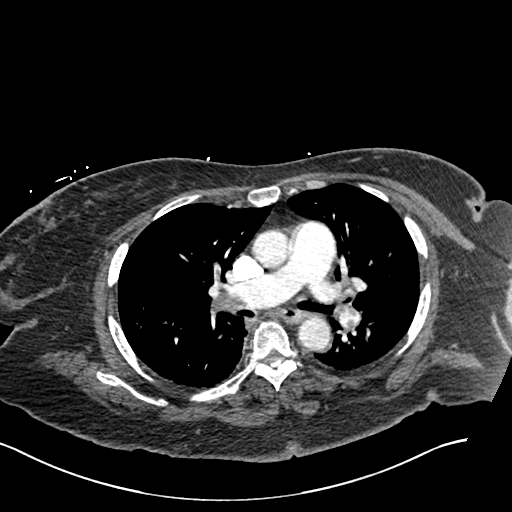
[im 191/293  lung]
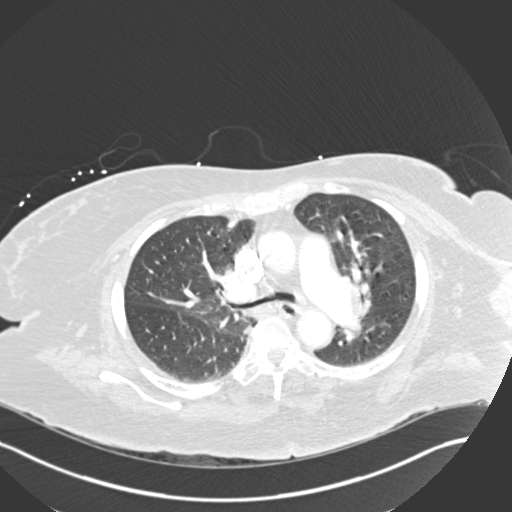
[im 204/293  soft-tissue]
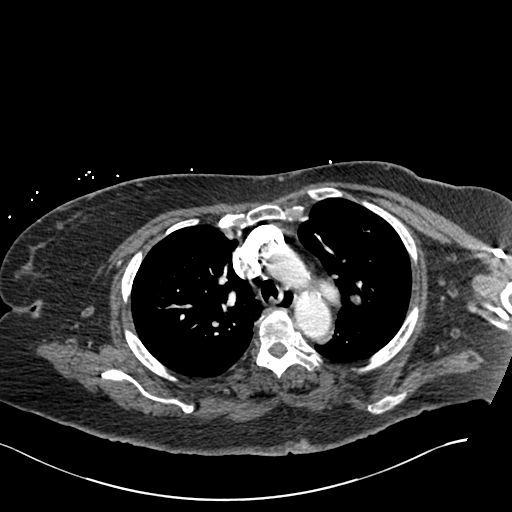
[im 229/293  lung]
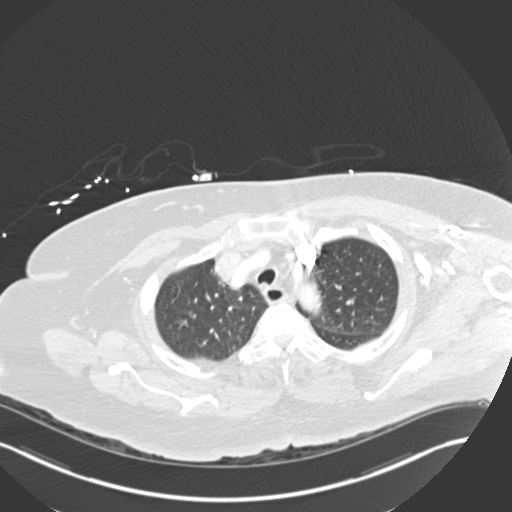
[im 242/293  soft-tissue]
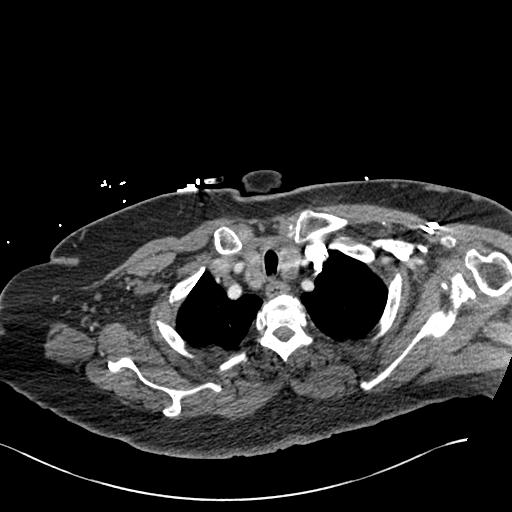
[im 254/293  lung]
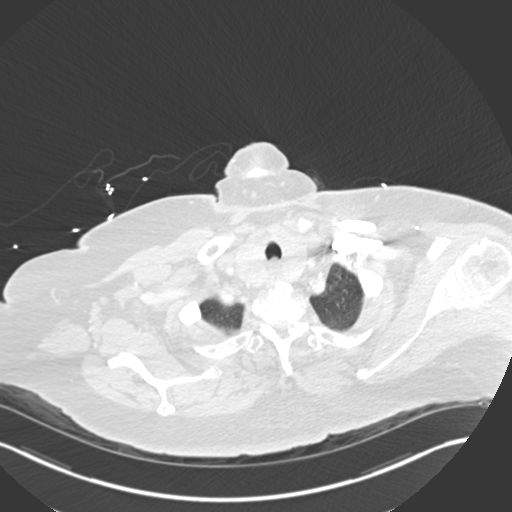
[im 280/293  soft-tissue]
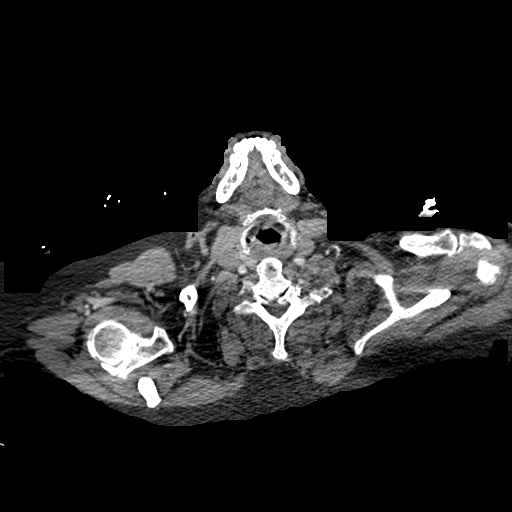

[Series 6: coronal mpr · coronal · 0.60mm/px · 2 of 89 slices shown]
[im 30/89  soft-tissue]
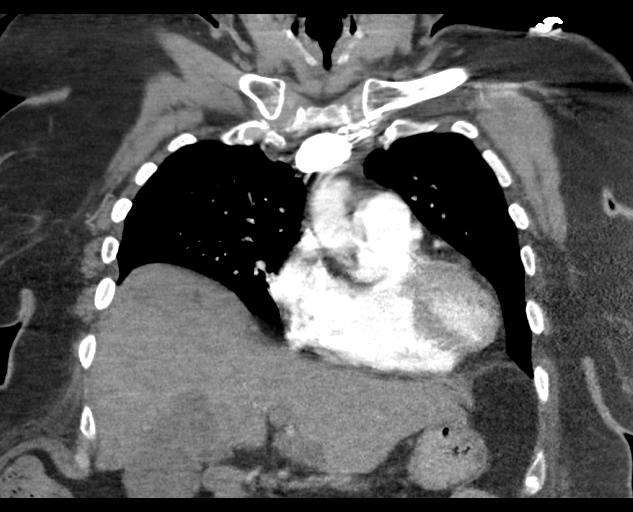
[im 59/89  soft-tissue]
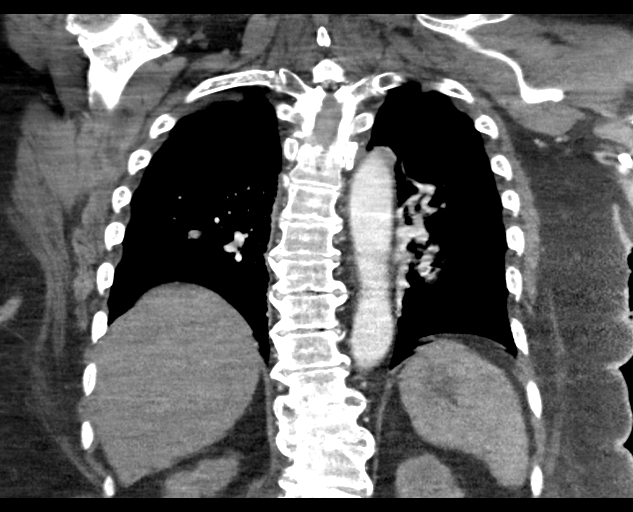

[18 of 46 positions shown; findings below may reference images not displayed]

FINDINGS: Cardiovascular:  This is a technically satisfactory study.

Pulmonary emboli are identified within the distal RIGHT and LEFT
main pulmonary arteries extending into the majority of the segmental
pulmonary arteries bilaterally. RV/LV ratio measures 1.0 and
compatible with RIGHT heart strain.

Mild aortic atherosclerotic calcifications noted without evidence of
thoracic aortic aneurysm.

No pericardial effusion.

Mediastinum/Nodes: No enlarged mediastinal, hilar, or axillary lymph
nodes. Thyroid gland, trachea, and esophagus demonstrate no
significant findings.

Lungs/Pleura: Minimal bibasilar atelectasis noted. No airspace
disease, consolidation, nodule, mass, pleural effusion or
pneumothorax.

Upper Abdomen: No acute abnormality

Musculoskeletal: No acute or suspicious bony abnormality.

Review of the MIP images confirms the above findings.
IMPRESSION: 1. Bilateral pulmonary emboli with CT evidence of RIGHT heart
strain.
2.  Aortic Atherosclerosis ([id number]).

Critical Value/emergent results were called by telephone at the time
of interpretation on 01/20/2018 at [DATE] to Zae ,nurse for this
patient, who verbally acknowledged these results.

## 2019-06-28 IMAGING — DX DG CHEST 2V
2 series · 2 of 2 positions shown · non-contrast
Comparison: Radiographs 01/27/2018 and 01/19/2018.  CT 01/20/2018.

CLINICAL DATA: Follow up pneumonia. Nonsmoker. Recent pulmonary
embolism.

EXAM:
CHEST - 2 VIEW

[chest lat]
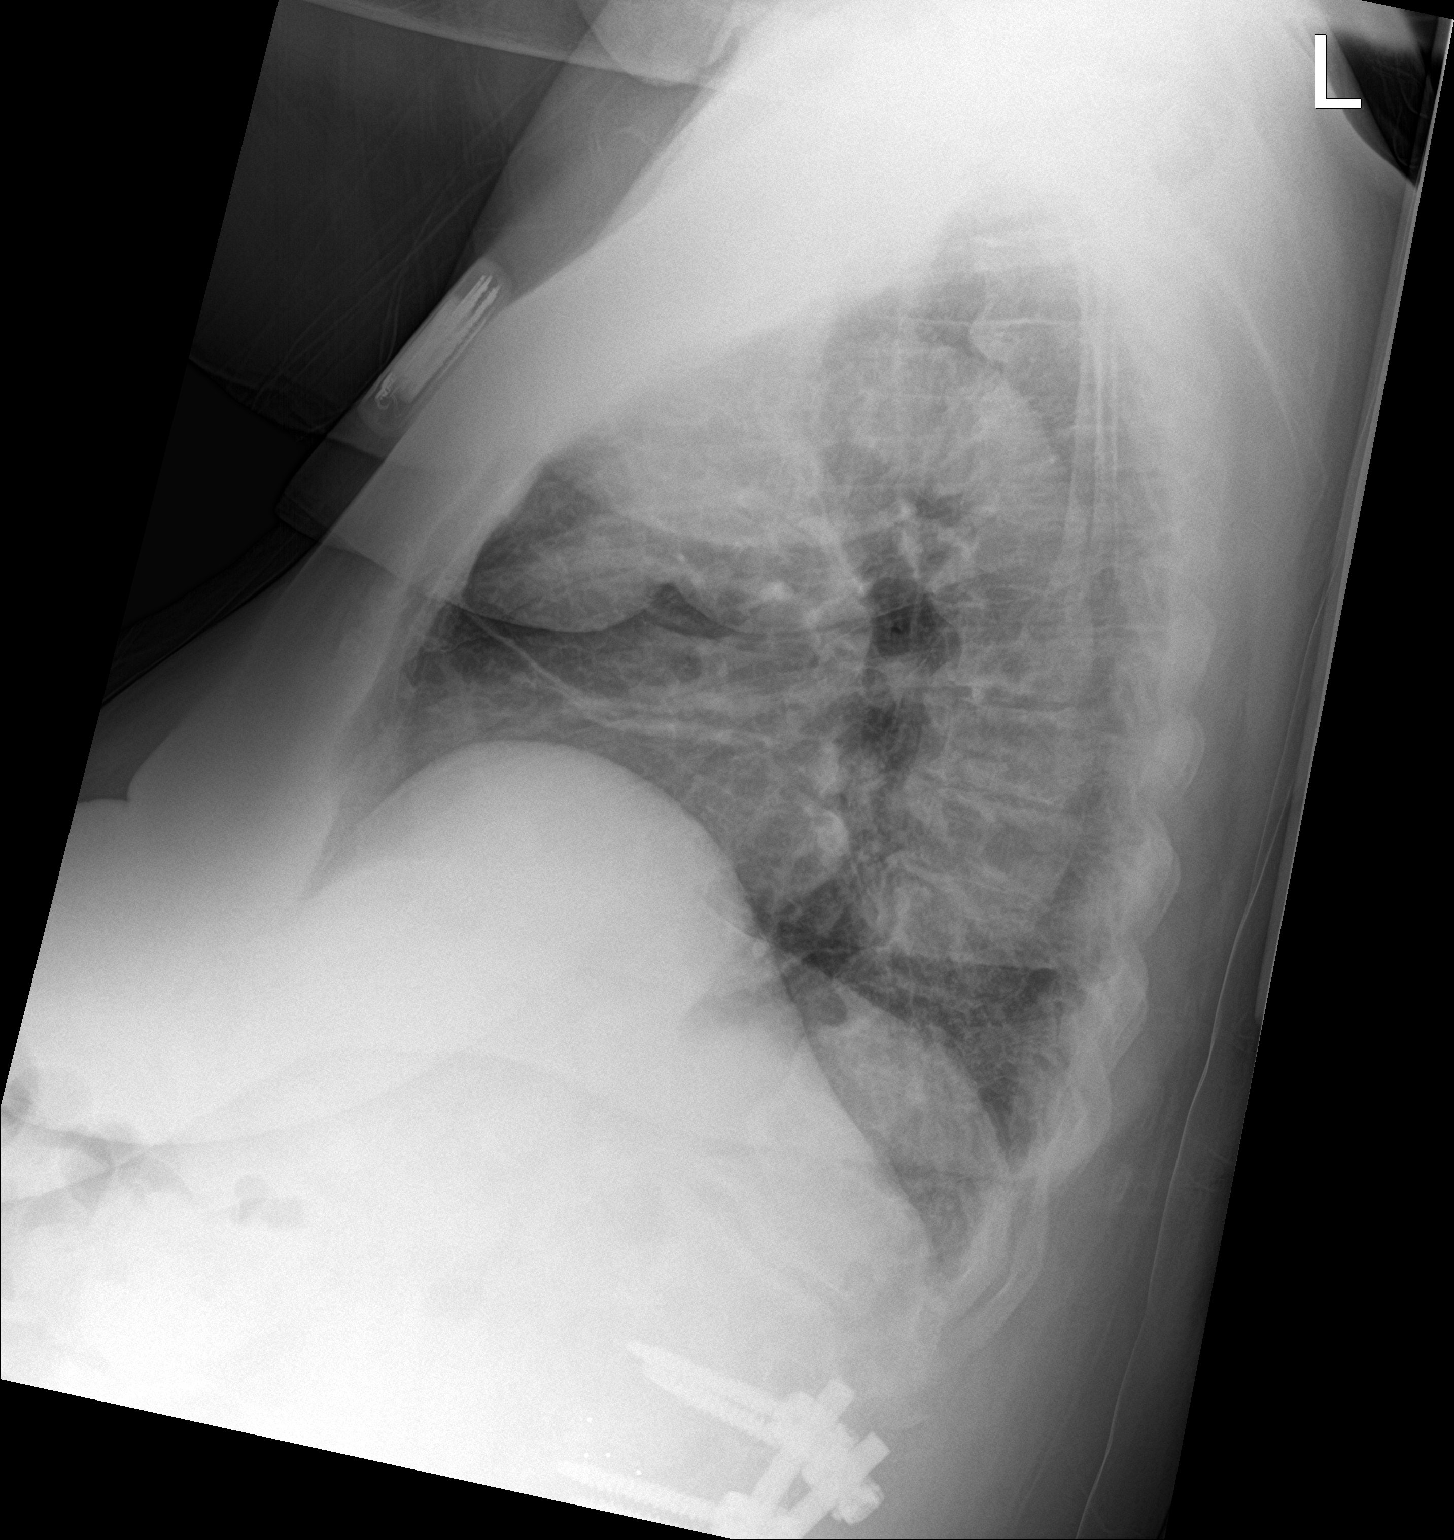

[chest ap strecther]
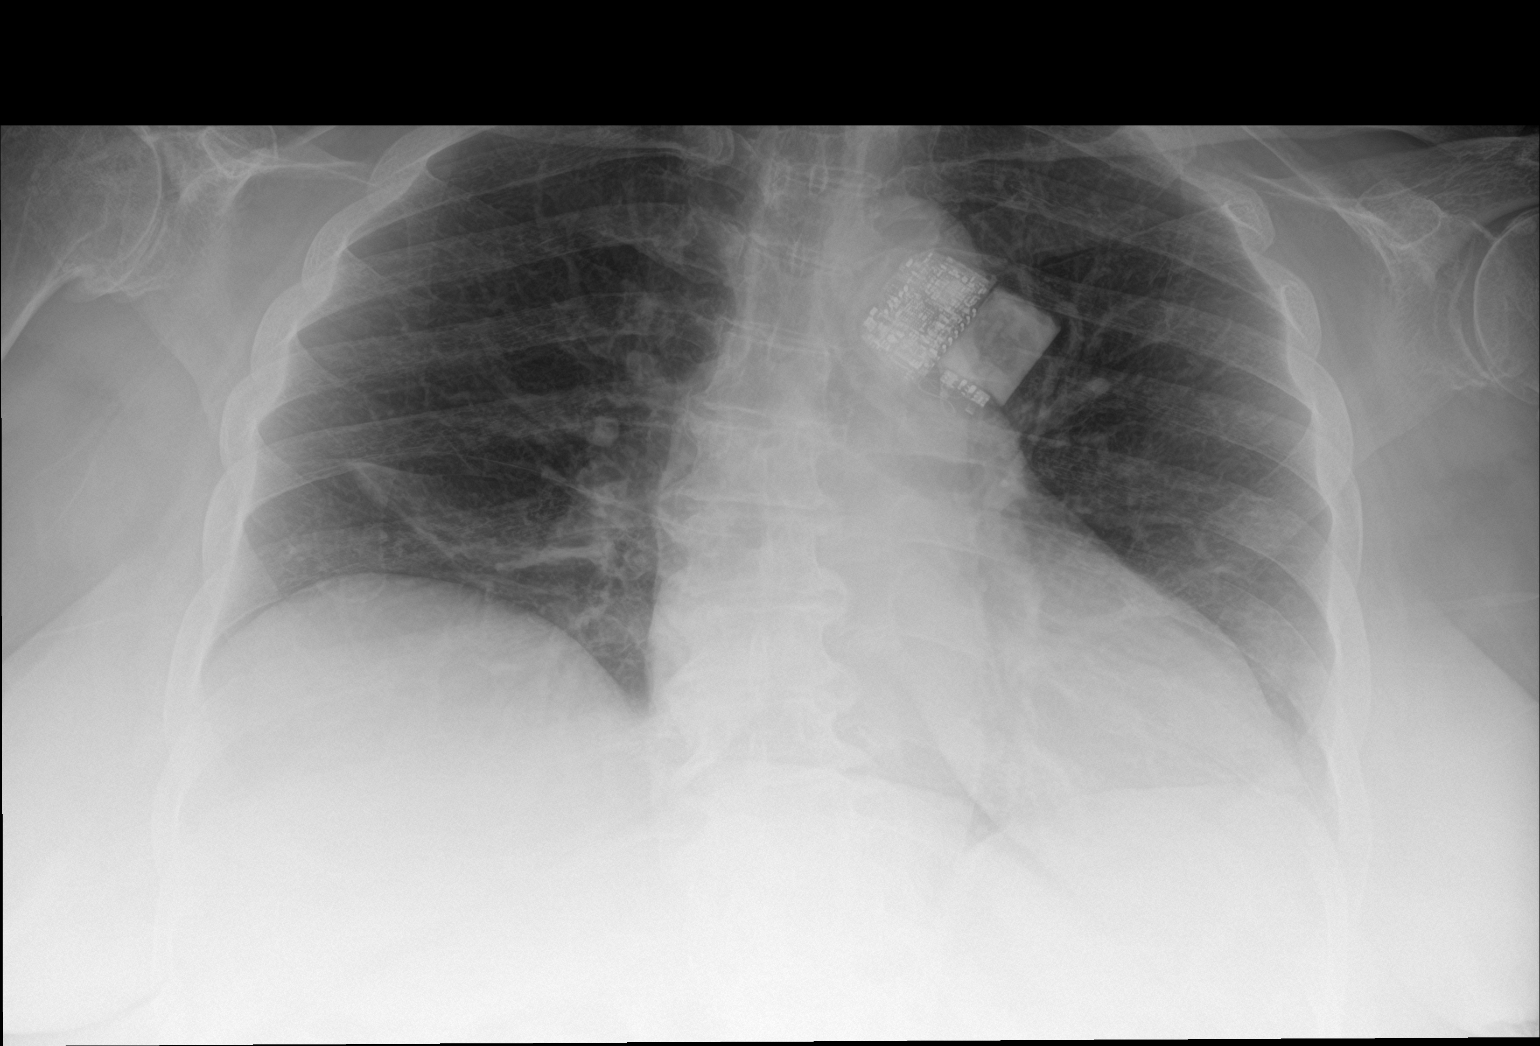

[2 of 2 positions shown; findings below may reference images not displayed]

FINDINGS: There are low lung volumes. The heart size and mediastinal contours
are stable. There is streaky right middle lobe density, most likely
atelectasis. There is no confluent airspace opacity, pleural
effusion or pneumothorax. There are stable postsurgical changes in
the lumbar spine and degenerative changes at the right shoulder.
IMPRESSION: Stable probable subsegmental atelectasis in the right middle lobe.
No new findings.
